# Patient Record
Sex: Female | Born: 1935 | Race: White | Hispanic: No | Marital: Married | Smoking: Never smoker
Health system: Southern US, Community
[De-identification: ages and names within clinical notes are randomized; demographics above are authoritative.]

## PROBLEM LIST (undated history)

## (undated) DIAGNOSIS — C189 Malignant neoplasm of colon, unspecified: Secondary | ICD-10-CM

## (undated) DIAGNOSIS — K529 Noninfective gastroenteritis and colitis, unspecified: Secondary | ICD-10-CM

## (undated) DIAGNOSIS — R55 Syncope and collapse: Secondary | ICD-10-CM

## (undated) DIAGNOSIS — I455 Other specified heart block: Secondary | ICD-10-CM

## (undated) DIAGNOSIS — IMO0002 Reserved for concepts with insufficient information to code with codable children: Secondary | ICD-10-CM

## (undated) DIAGNOSIS — IMO0001 Reserved for inherently not codable concepts without codable children: Secondary | ICD-10-CM

## (undated) DIAGNOSIS — C50919 Malignant neoplasm of unspecified site of unspecified female breast: Secondary | ICD-10-CM

## (undated) DIAGNOSIS — I10 Essential (primary) hypertension: Secondary | ICD-10-CM

## (undated) HISTORY — DX: Syncope and collapse: R55

## (undated) HISTORY — DX: Reserved for concepts with insufficient information to code with codable children: IMO0002

## (undated) HISTORY — PX: BREAST LUMPECTOMY: SHX2

## (undated) HISTORY — DX: Malignant neoplasm of colon, unspecified: C18.9

## (undated) HISTORY — DX: Reserved for inherently not codable concepts without codable children: IMO0001

## (undated) HISTORY — DX: Essential (primary) hypertension: I10

## (undated) HISTORY — PX: TONSILLECTOMY: SUR1361

## (undated) HISTORY — PX: CHOLECYSTECTOMY: SHX55

## (undated) SURGERY — LAPAROSCOPIC PARTIAL COLECTOMY
Anesthesia: General

---

## 1998-01-07 ENCOUNTER — Ambulatory Visit (HOSPITAL_COMMUNITY): Admission: RE | Admit: 1998-01-07 | Discharge: 1998-01-07 | Payer: Self-pay | Admitting: *Deleted

## 1998-01-16 ENCOUNTER — Ambulatory Visit (HOSPITAL_COMMUNITY): Admission: RE | Admit: 1998-01-16 | Discharge: 1998-01-16 | Payer: Self-pay | Admitting: *Deleted

## 1998-01-16 ENCOUNTER — Encounter: Payer: Self-pay | Admitting: *Deleted

## 1998-02-04 ENCOUNTER — Encounter: Payer: Self-pay | Admitting: General Surgery

## 1998-02-04 ENCOUNTER — Ambulatory Visit (HOSPITAL_COMMUNITY): Admission: RE | Admit: 1998-02-04 | Discharge: 1998-02-04 | Payer: Self-pay | Admitting: General Surgery

## 1998-02-24 ENCOUNTER — Encounter: Payer: Self-pay | Admitting: General Surgery

## 1998-02-24 ENCOUNTER — Ambulatory Visit (HOSPITAL_BASED_OUTPATIENT_CLINIC_OR_DEPARTMENT_OTHER): Admission: RE | Admit: 1998-02-24 | Discharge: 1998-02-24 | Payer: Self-pay | Admitting: General Surgery

## 1998-03-04 ENCOUNTER — Ambulatory Visit (HOSPITAL_COMMUNITY): Admission: RE | Admit: 1998-03-04 | Discharge: 1998-03-04 | Payer: Self-pay | Admitting: Interventional Radiology

## 1998-05-13 ENCOUNTER — Other Ambulatory Visit: Admission: RE | Admit: 1998-05-13 | Discharge: 1998-05-13 | Payer: Self-pay | Admitting: *Deleted

## 1998-08-28 ENCOUNTER — Encounter: Admission: RE | Admit: 1998-08-28 | Discharge: 1998-11-26 | Payer: Self-pay | Admitting: Radiation Oncology

## 1998-09-08 ENCOUNTER — Ambulatory Visit (HOSPITAL_COMMUNITY): Admission: RE | Admit: 1998-09-08 | Discharge: 1998-09-08 | Payer: Self-pay | Admitting: Hematology & Oncology

## 1998-09-08 ENCOUNTER — Encounter: Payer: Self-pay | Admitting: Hematology & Oncology

## 1999-02-04 ENCOUNTER — Encounter: Payer: Self-pay | Admitting: Hematology & Oncology

## 1999-02-04 ENCOUNTER — Ambulatory Visit (HOSPITAL_COMMUNITY): Admission: RE | Admit: 1999-02-04 | Discharge: 1999-02-04 | Payer: Self-pay | Admitting: Hematology & Oncology

## 1999-04-14 ENCOUNTER — Other Ambulatory Visit: Admission: RE | Admit: 1999-04-14 | Discharge: 1999-04-14 | Payer: Self-pay | Admitting: Obstetrics and Gynecology

## 1999-04-16 ENCOUNTER — Encounter: Payer: Self-pay | Admitting: Obstetrics and Gynecology

## 1999-04-16 ENCOUNTER — Encounter: Admission: RE | Admit: 1999-04-16 | Discharge: 1999-04-16 | Payer: Self-pay | Admitting: Obstetrics and Gynecology

## 1999-11-30 ENCOUNTER — Ambulatory Visit (HOSPITAL_COMMUNITY): Admission: RE | Admit: 1999-11-30 | Discharge: 1999-11-30 | Payer: Self-pay | Admitting: Gastroenterology

## 2000-02-09 ENCOUNTER — Encounter: Payer: Self-pay | Admitting: Hematology & Oncology

## 2000-02-09 ENCOUNTER — Ambulatory Visit (HOSPITAL_COMMUNITY): Admission: RE | Admit: 2000-02-09 | Discharge: 2000-02-09 | Payer: Self-pay | Admitting: Hematology & Oncology

## 2000-04-20 ENCOUNTER — Encounter: Admission: RE | Admit: 2000-04-20 | Discharge: 2000-04-20 | Payer: Self-pay | Admitting: Obstetrics and Gynecology

## 2000-04-20 ENCOUNTER — Encounter: Payer: Self-pay | Admitting: Obstetrics and Gynecology

## 2000-05-05 ENCOUNTER — Other Ambulatory Visit: Admission: RE | Admit: 2000-05-05 | Discharge: 2000-05-05 | Payer: Self-pay | Admitting: Obstetrics and Gynecology

## 2000-06-04 ENCOUNTER — Ambulatory Visit: Admission: RE | Admit: 2000-06-04 | Discharge: 2000-09-02 | Payer: Self-pay | Admitting: Radiation Oncology

## 2001-02-09 ENCOUNTER — Ambulatory Visit (HOSPITAL_COMMUNITY): Admission: RE | Admit: 2001-02-09 | Discharge: 2001-02-09 | Payer: Self-pay | Admitting: Hematology & Oncology

## 2001-02-09 ENCOUNTER — Encounter: Payer: Self-pay | Admitting: Hematology & Oncology

## 2001-06-30 ENCOUNTER — Other Ambulatory Visit: Admission: RE | Admit: 2001-06-30 | Discharge: 2001-06-30 | Payer: Self-pay | Admitting: Obstetrics and Gynecology

## 2002-02-09 ENCOUNTER — Ambulatory Visit (HOSPITAL_COMMUNITY): Admission: RE | Admit: 2002-02-09 | Discharge: 2002-02-09 | Payer: Self-pay | Admitting: Hematology & Oncology

## 2002-02-09 ENCOUNTER — Encounter: Payer: Self-pay | Admitting: Hematology & Oncology

## 2002-09-10 ENCOUNTER — Other Ambulatory Visit: Admission: RE | Admit: 2002-09-10 | Discharge: 2002-09-10 | Payer: Self-pay | Admitting: Obstetrics and Gynecology

## 2003-02-11 ENCOUNTER — Ambulatory Visit (HOSPITAL_COMMUNITY): Admission: RE | Admit: 2003-02-11 | Discharge: 2003-02-11 | Payer: Self-pay | Admitting: Hematology & Oncology

## 2004-02-11 ENCOUNTER — Ambulatory Visit (HOSPITAL_COMMUNITY): Admission: RE | Admit: 2004-02-11 | Discharge: 2004-02-11 | Payer: Self-pay | Admitting: Hematology & Oncology

## 2004-04-24 ENCOUNTER — Ambulatory Visit: Payer: Self-pay | Admitting: Hematology & Oncology

## 2004-10-23 ENCOUNTER — Ambulatory Visit: Payer: Self-pay | Admitting: Hematology & Oncology

## 2005-02-22 ENCOUNTER — Ambulatory Visit (HOSPITAL_COMMUNITY): Admission: RE | Admit: 2005-02-22 | Discharge: 2005-02-22 | Payer: Self-pay | Admitting: Hematology & Oncology

## 2005-04-23 ENCOUNTER — Ambulatory Visit: Payer: Self-pay | Admitting: Hematology & Oncology

## 2005-10-21 ENCOUNTER — Ambulatory Visit: Payer: Self-pay | Admitting: Hematology & Oncology

## 2005-11-01 LAB — COMPREHENSIVE METABOLIC PANEL
AST: 17 U/L (ref 0–37)
Alkaline Phosphatase: 82 U/L (ref 39–117)
BUN: 14 mg/dL (ref 6–23)
Glucose, Bld: 102 mg/dL — ABNORMAL HIGH (ref 70–99)
Total Bilirubin: 0.5 mg/dL (ref 0.3–1.2)

## 2005-11-01 LAB — CBC WITH DIFFERENTIAL/PLATELET
Basophils Absolute: 0 10*3/uL (ref 0.0–0.1)
EOS%: 1.2 % (ref 0.0–7.0)
Eosinophils Absolute: 0.1 10*3/uL (ref 0.0–0.5)
HGB: 13.7 g/dL (ref 11.6–15.9)
LYMPH%: 31.5 % (ref 14.0–48.0)
MCH: 35.3 pg — ABNORMAL HIGH (ref 26.0–34.0)
MCV: 99.8 fL (ref 81.0–101.0)
MONO%: 9.5 % (ref 0.0–13.0)
NEUT#: 3.7 10*3/uL (ref 1.5–6.5)
Platelets: 199 10*3/uL (ref 145–400)
RBC: 3.88 10*6/uL (ref 3.70–5.32)

## 2006-02-25 ENCOUNTER — Ambulatory Visit (HOSPITAL_COMMUNITY): Admission: RE | Admit: 2006-02-25 | Discharge: 2006-02-25 | Payer: Self-pay | Admitting: Family Medicine

## 2006-03-08 ENCOUNTER — Encounter (INDEPENDENT_AMBULATORY_CARE_PROVIDER_SITE_OTHER): Payer: Self-pay | Admitting: Diagnostic Radiology

## 2006-03-08 ENCOUNTER — Encounter (INDEPENDENT_AMBULATORY_CARE_PROVIDER_SITE_OTHER): Payer: Self-pay | Admitting: Specialist

## 2006-03-08 ENCOUNTER — Encounter: Admission: RE | Admit: 2006-03-08 | Discharge: 2006-03-08 | Payer: Self-pay | Admitting: Family Medicine

## 2006-03-11 ENCOUNTER — Encounter: Admission: RE | Admit: 2006-03-11 | Discharge: 2006-03-11 | Payer: Self-pay | Admitting: Internal Medicine

## 2006-03-18 ENCOUNTER — Encounter: Admission: RE | Admit: 2006-03-18 | Discharge: 2006-03-18 | Payer: Self-pay | Admitting: Internal Medicine

## 2006-03-28 ENCOUNTER — Encounter: Admission: RE | Admit: 2006-03-28 | Discharge: 2006-03-28 | Payer: Self-pay | Admitting: General Surgery

## 2006-03-28 ENCOUNTER — Encounter (INDEPENDENT_AMBULATORY_CARE_PROVIDER_SITE_OTHER): Payer: Self-pay | Admitting: *Deleted

## 2006-04-04 ENCOUNTER — Encounter: Admission: RE | Admit: 2006-04-04 | Discharge: 2006-04-04 | Payer: Self-pay | Admitting: General Surgery

## 2006-04-05 ENCOUNTER — Encounter: Admission: RE | Admit: 2006-04-05 | Discharge: 2006-04-05 | Payer: Self-pay | Admitting: General Surgery

## 2006-04-05 ENCOUNTER — Encounter (INDEPENDENT_AMBULATORY_CARE_PROVIDER_SITE_OTHER): Payer: Self-pay | Admitting: Specialist

## 2006-04-05 ENCOUNTER — Ambulatory Visit (HOSPITAL_BASED_OUTPATIENT_CLINIC_OR_DEPARTMENT_OTHER): Admission: RE | Admit: 2006-04-05 | Discharge: 2006-04-05 | Payer: Self-pay | Admitting: General Surgery

## 2006-04-22 ENCOUNTER — Ambulatory Visit: Admission: RE | Admit: 2006-04-22 | Discharge: 2006-06-24 | Payer: Self-pay | Admitting: Radiation Oncology

## 2006-05-09 ENCOUNTER — Ambulatory Visit: Payer: Self-pay | Admitting: Hematology & Oncology

## 2006-05-11 LAB — COMPREHENSIVE METABOLIC PANEL
ALT: 19 U/L (ref 0–35)
AST: 20 U/L (ref 0–37)
CO2: 27 mEq/L (ref 19–32)
Calcium: 9.8 mg/dL (ref 8.4–10.5)
Chloride: 103 mEq/L (ref 96–112)
Sodium: 139 mEq/L (ref 135–145)
Total Protein: 7.3 g/dL (ref 6.0–8.3)

## 2006-05-11 LAB — CBC WITH DIFFERENTIAL/PLATELET
BASO%: 0.7 % (ref 0.0–2.0)
EOS%: 1.5 % (ref 0.0–7.0)
HCT: 38.5 % (ref 34.8–46.6)
HGB: 13.9 g/dL (ref 11.6–15.9)
MCH: 35.1 pg — ABNORMAL HIGH (ref 26.0–34.0)
MCHC: 36.2 g/dL — ABNORMAL HIGH (ref 32.0–36.0)
MONO#: 0.7 10*3/uL (ref 0.1–0.9)
NEUT#: 2.9 10*3/uL (ref 1.5–6.5)
Platelets: 204 10*3/uL (ref 145–400)
RBC: 3.97 10*6/uL (ref 3.70–5.32)
RDW: 12.7 % (ref 11.3–14.5)
lymph#: 1.5 10*3/uL (ref 0.9–3.3)

## 2006-07-12 ENCOUNTER — Ambulatory Visit: Payer: Self-pay | Admitting: Hematology & Oncology

## 2006-10-19 ENCOUNTER — Ambulatory Visit: Payer: Self-pay | Admitting: Hematology & Oncology

## 2006-10-21 LAB — COMPREHENSIVE METABOLIC PANEL
ALT: 20 U/L (ref 0–35)
AST: 20 U/L (ref 0–37)
Alkaline Phosphatase: 83 U/L (ref 39–117)
CO2: 26 mEq/L (ref 19–32)
Creatinine, Ser: 0.83 mg/dL (ref 0.40–1.20)
Sodium: 140 mEq/L (ref 135–145)
Total Bilirubin: 0.5 mg/dL (ref 0.3–1.2)
Total Protein: 7.3 g/dL (ref 6.0–8.3)

## 2006-10-21 LAB — CBC WITH DIFFERENTIAL/PLATELET
BASO%: 0.7 % (ref 0.0–2.0)
EOS%: 1.6 % (ref 0.0–7.0)
HCT: 36.8 % (ref 34.8–46.6)
LYMPH%: 32.3 % (ref 14.0–48.0)
MCH: 36 pg — ABNORMAL HIGH (ref 26.0–34.0)
MCHC: 36.6 g/dL — ABNORMAL HIGH (ref 32.0–36.0)
MONO#: 0.5 10*3/uL (ref 0.1–0.9)
NEUT%: 54.4 % (ref 39.6–76.8)
Platelets: 180 10*3/uL (ref 145–400)
RBC: 3.74 10*6/uL (ref 3.70–5.32)
WBC: 4.6 10*3/uL (ref 3.9–10.0)

## 2007-02-15 ENCOUNTER — Ambulatory Visit: Payer: Self-pay | Admitting: Hematology & Oncology

## 2007-02-17 LAB — CBC WITH DIFFERENTIAL/PLATELET
BASO%: 0.8 % (ref 0.0–2.0)
Eosinophils Absolute: 0.1 10*3/uL (ref 0.0–0.5)
LYMPH%: 33.2 % (ref 14.0–48.0)
MCHC: 34.8 g/dL (ref 32.0–36.0)
MCV: 99.1 fL (ref 81.0–101.0)
MONO%: 11 % (ref 0.0–13.0)
NEUT#: 2.5 10*3/uL (ref 1.5–6.5)
Platelets: 186 10*3/uL (ref 145–400)
RBC: 3.92 10*6/uL (ref 3.70–5.32)
RDW: 13.1 % (ref 11.3–14.5)
WBC: 4.7 10*3/uL (ref 3.9–10.0)

## 2007-02-17 LAB — COMPREHENSIVE METABOLIC PANEL
ALT: 18 U/L (ref 0–35)
AST: 22 U/L (ref 0–37)
Albumin: 4.4 g/dL (ref 3.5–5.2)
Alkaline Phosphatase: 65 U/L (ref 39–117)
Glucose, Bld: 79 mg/dL (ref 70–99)
Potassium: 4.6 mEq/L (ref 3.5–5.3)
Sodium: 140 mEq/L (ref 135–145)
Total Bilirubin: 0.4 mg/dL (ref 0.3–1.2)
Total Protein: 7.1 g/dL (ref 6.0–8.3)

## 2007-03-10 ENCOUNTER — Encounter: Admission: RE | Admit: 2007-03-10 | Discharge: 2007-03-10 | Payer: Self-pay | Admitting: Hematology & Oncology

## 2007-06-02 ENCOUNTER — Ambulatory Visit: Payer: Self-pay | Admitting: Hematology & Oncology

## 2007-06-06 LAB — COMPREHENSIVE METABOLIC PANEL
Albumin: 4.6 g/dL (ref 3.5–5.2)
BUN: 10 mg/dL (ref 6–23)
CO2: 24 mEq/L (ref 19–32)
Calcium: 9.6 mg/dL (ref 8.4–10.5)
Chloride: 106 mEq/L (ref 96–112)
Creatinine, Ser: 0.7 mg/dL (ref 0.40–1.20)
Glucose, Bld: 88 mg/dL (ref 70–99)
Potassium: 4.3 mEq/L (ref 3.5–5.3)

## 2007-06-06 LAB — CBC WITH DIFFERENTIAL/PLATELET
Basophils Absolute: 0 10*3/uL (ref 0.0–0.1)
Eosinophils Absolute: 0.1 10*3/uL (ref 0.0–0.5)
MCH: 34.9 pg — ABNORMAL HIGH (ref 26.0–34.0)
NEUT%: 50.8 % (ref 39.6–76.8)
RDW: 13.2 % (ref 11.3–14.5)
WBC: 4.7 10*3/uL (ref 3.9–10.0)
lymph#: 1.7 10*3/uL (ref 0.9–3.3)

## 2007-12-06 ENCOUNTER — Ambulatory Visit: Payer: Self-pay | Admitting: Hematology & Oncology

## 2007-12-07 LAB — CBC WITH DIFFERENTIAL (CANCER CENTER ONLY)
BASO%: 0.6 % (ref 0.0–2.0)
Eosinophils Absolute: 0.1 10*3/uL (ref 0.0–0.5)
HCT: 37.2 % (ref 34.8–46.6)
LYMPH#: 1.7 10*3/uL (ref 0.9–3.3)
MONO#: 0.4 10*3/uL (ref 0.1–0.9)
Platelets: 179 10*3/uL (ref 145–400)
RBC: 3.69 10*6/uL — ABNORMAL LOW (ref 3.70–5.32)
RDW: 10.4 % — ABNORMAL LOW (ref 10.5–14.6)
WBC: 5.1 10*3/uL (ref 3.9–10.0)

## 2007-12-07 LAB — COMPREHENSIVE METABOLIC PANEL
ALT: 20 U/L (ref 0–35)
CO2: 25 mEq/L (ref 19–32)
Sodium: 141 mEq/L (ref 135–145)
Total Bilirubin: 0.5 mg/dL (ref 0.3–1.2)
Total Protein: 7 g/dL (ref 6.0–8.3)

## 2008-03-22 ENCOUNTER — Encounter: Admission: RE | Admit: 2008-03-22 | Discharge: 2008-03-22 | Payer: Self-pay | Admitting: Hematology & Oncology

## 2008-06-05 ENCOUNTER — Ambulatory Visit: Payer: Self-pay | Admitting: Hematology & Oncology

## 2008-06-06 LAB — COMPREHENSIVE METABOLIC PANEL
ALT: 18 U/L (ref 0–35)
BUN: 13 mg/dL (ref 6–23)
CO2: 26 mEq/L (ref 19–32)
Calcium: 9.9 mg/dL (ref 8.4–10.5)
Chloride: 103 mEq/L (ref 96–112)
Creatinine, Ser: 0.79 mg/dL (ref 0.40–1.20)

## 2008-06-06 LAB — CBC WITH DIFFERENTIAL (CANCER CENTER ONLY)
BASO%: 0.4 % (ref 0.0–2.0)
Eosinophils Absolute: 0.1 10*3/uL (ref 0.0–0.5)
LYMPH%: 39.2 % (ref 14.0–48.0)
MCV: 102 fL — ABNORMAL HIGH (ref 81–101)
MONO#: 0.3 10*3/uL (ref 0.1–0.9)
MONO%: 7.4 % (ref 0.0–13.0)
NEUT#: 2.1 10*3/uL (ref 1.5–6.5)
Platelets: 162 10*3/uL (ref 145–400)
RBC: 3.62 10*6/uL — ABNORMAL LOW (ref 3.70–5.32)
RDW: 11.2 % (ref 10.5–14.6)
WBC: 4.2 10*3/uL (ref 3.9–10.0)

## 2008-06-06 LAB — CHCC SATELLITE - SMEAR

## 2008-12-11 ENCOUNTER — Ambulatory Visit: Payer: Self-pay | Admitting: Hematology & Oncology

## 2008-12-12 LAB — CBC WITH DIFFERENTIAL (CANCER CENTER ONLY)
EOS%: 2 % (ref 0.0–7.0)
Eosinophils Absolute: 0.1 10*3/uL (ref 0.0–0.5)
LYMPH%: 42.8 % (ref 14.0–48.0)
MCH: 35.6 pg — ABNORMAL HIGH (ref 26.0–34.0)
MCHC: 35.7 g/dL (ref 32.0–36.0)
MCV: 100 fL (ref 81–101)
MONO%: 10 % (ref 0.0–13.0)
NEUT#: 2.4 10*3/uL (ref 1.5–6.5)
Platelets: 200 10*3/uL (ref 145–400)
RDW: 9.8 % — ABNORMAL LOW (ref 10.5–14.6)

## 2008-12-13 LAB — COMPREHENSIVE METABOLIC PANEL
Albumin: 4.6 g/dL (ref 3.5–5.2)
Alkaline Phosphatase: 56 U/L (ref 39–117)
BUN: 14 mg/dL (ref 6–23)
CO2: 28 mEq/L (ref 19–32)
Calcium: 10.1 mg/dL (ref 8.4–10.5)
Glucose, Bld: 73 mg/dL (ref 70–99)
Potassium: 4.3 mEq/L (ref 3.5–5.3)
Total Protein: 7 g/dL (ref 6.0–8.3)

## 2008-12-13 LAB — VITAMIN D 25 HYDROXY (VIT D DEFICIENCY, FRACTURES): Vit D, 25-Hydroxy: 42 ng/mL (ref 30–89)

## 2009-04-03 ENCOUNTER — Encounter: Admission: RE | Admit: 2009-04-03 | Discharge: 2009-04-03 | Payer: Self-pay | Admitting: Hematology & Oncology

## 2009-06-04 ENCOUNTER — Ambulatory Visit: Payer: Self-pay | Admitting: Hematology & Oncology

## 2009-06-05 LAB — CBC WITH DIFFERENTIAL (CANCER CENTER ONLY)
Eosinophils Absolute: 0.1 10*3/uL (ref 0.0–0.5)
MCHC: 34.8 g/dL (ref 32.0–36.0)
MCV: 102 fL — ABNORMAL HIGH (ref 81–101)
NEUT#: 3.1 10*3/uL (ref 1.5–6.5)
RBC: 3.87 10*6/uL (ref 3.70–5.32)
RDW: 10.7 % (ref 10.5–14.6)
WBC: 5.8 10*3/uL (ref 3.9–10.0)

## 2009-06-05 LAB — COMPREHENSIVE METABOLIC PANEL
ALT: 21 U/L (ref 0–35)
AST: 20 U/L (ref 0–37)
Albumin: 4.7 g/dL (ref 3.5–5.2)
Alkaline Phosphatase: 89 U/L (ref 39–117)
BUN: 13 mg/dL (ref 6–23)
CO2: 24 mEq/L (ref 19–32)
Creatinine, Ser: 0.76 mg/dL (ref 0.40–1.20)
Potassium: 4 mEq/L (ref 3.5–5.3)
Sodium: 136 mEq/L (ref 135–145)
Total Bilirubin: 0.5 mg/dL (ref 0.3–1.2)

## 2009-12-02 ENCOUNTER — Ambulatory Visit: Payer: Self-pay | Admitting: Hematology & Oncology

## 2009-12-04 LAB — CBC WITH DIFFERENTIAL (CANCER CENTER ONLY)
EOS%: 2 % (ref 0.0–7.0)
Eosinophils Absolute: 0.1 10*3/uL (ref 0.0–0.5)
HCT: 41.4 % (ref 34.8–46.6)
LYMPH#: 1.7 10*3/uL (ref 0.9–3.3)
LYMPH%: 36.9 % (ref 14.0–48.0)
MCHC: 34.3 g/dL (ref 32.0–36.0)
MCV: 102 fL — ABNORMAL HIGH (ref 81–101)
NEUT#: 2.3 10*3/uL (ref 1.5–6.5)
Platelets: 190 10*3/uL (ref 145–400)
RBC: 4.04 10*6/uL (ref 3.70–5.32)
RDW: 10.8 % (ref 10.5–14.6)
WBC: 4.6 10*3/uL (ref 3.9–10.0)

## 2009-12-05 LAB — COMPREHENSIVE METABOLIC PANEL
ALT: 22 U/L (ref 0–35)
AST: 25 U/L (ref 0–37)
BUN: 15 mg/dL (ref 6–23)
Calcium: 10.5 mg/dL (ref 8.4–10.5)
Sodium: 139 mEq/L (ref 135–145)
Total Bilirubin: 0.6 mg/dL (ref 0.3–1.2)
Total Protein: 7.7 g/dL (ref 6.0–8.3)

## 2009-12-05 LAB — VITAMIN D 25 HYDROXY (VIT D DEFICIENCY, FRACTURES): Vit D, 25-Hydroxy: 60 ng/mL (ref 30–89)

## 2010-02-15 ENCOUNTER — Encounter: Payer: Self-pay | Admitting: General Surgery

## 2010-04-01 ENCOUNTER — Other Ambulatory Visit: Payer: Self-pay | Admitting: Hematology & Oncology

## 2010-04-01 DIAGNOSIS — Z9889 Other specified postprocedural states: Secondary | ICD-10-CM

## 2010-04-01 DIAGNOSIS — Z1231 Encounter for screening mammogram for malignant neoplasm of breast: Secondary | ICD-10-CM

## 2010-04-07 ENCOUNTER — Ambulatory Visit
Admission: RE | Admit: 2010-04-07 | Discharge: 2010-04-07 | Disposition: A | Discharge status: Home / Self Care | Payer: Medicare Other | Source: Ambulatory Visit | Attending: Hematology & Oncology | Admitting: Hematology & Oncology

## 2010-04-07 DIAGNOSIS — Z9889 Other specified postprocedural states: Secondary | ICD-10-CM

## 2010-06-11 ENCOUNTER — Other Ambulatory Visit: Payer: Self-pay | Admitting: Hematology & Oncology

## 2010-06-11 ENCOUNTER — Encounter (HOSPITAL_BASED_OUTPATIENT_CLINIC_OR_DEPARTMENT_OTHER): Payer: Medicare Other | Admitting: Hematology & Oncology

## 2010-06-11 DIAGNOSIS — E559 Vitamin D deficiency, unspecified: Secondary | ICD-10-CM

## 2010-06-11 DIAGNOSIS — Z170 Estrogen receptor positive status [ER+]: Secondary | ICD-10-CM

## 2010-06-11 DIAGNOSIS — D0590 Unspecified type of carcinoma in situ of unspecified breast: Secondary | ICD-10-CM

## 2010-06-11 DIAGNOSIS — Z853 Personal history of malignant neoplasm of breast: Secondary | ICD-10-CM

## 2010-06-11 LAB — CBC WITH DIFFERENTIAL (CANCER CENTER ONLY)
BASO#: 0 10*3/uL (ref 0.0–0.2)
HCT: 37.5 % (ref 34.8–46.6)
HGB: 13.1 g/dL (ref 11.6–15.9)
LYMPH%: 36.7 % (ref 14.0–48.0)
MCV: 99 fL (ref 81–101)
MONO%: 13.7 % — ABNORMAL HIGH (ref 0.0–13.0)

## 2010-06-12 LAB — COMPREHENSIVE METABOLIC PANEL
AST: 21 U/L (ref 0–37)
Albumin: 4.5 g/dL (ref 3.5–5.2)
Alkaline Phosphatase: 101 U/L (ref 39–117)
BUN: 16 mg/dL (ref 6–23)
Potassium: 4.3 mEq/L (ref 3.5–5.3)
Sodium: 140 mEq/L (ref 135–145)
Total Bilirubin: 0.5 mg/dL (ref 0.3–1.2)

## 2010-06-12 NOTE — Op Note (Signed)
NAMEARIJANA, NARAYAN              ACCOUNT NO.:  [id number]   MEDICAL RECORD NO.:  [id number]          PATIENT TYPE:  AMB   LOCATION:  DSC                          FACILITY:  [facility]   PHYSICIAN:  Rose Phi. Maple Hudson, M.D.   DATE OF BIRTH:  29-Apr-1929   DATE OF PROCEDURE:  04/05/2006  DATE OF DISCHARGE:                               OPERATIVE REPORT   PREOPERATIVE DIAGNOSIS:  Ductal carcinoma in situ of the right breast.   POSTOPERATIVE DIAGNOSIS:  Ductal carcinoma in situ of the right breast.   OPERATION PERFORMED:  Right partial mastectomy with needle localization  and specimen mammogram.   SURGEON:  Rose Phi. Maple Hudson, M.D.   ANESTHESIA:  General.   OPERATIVE PROCEDURE:  Prior to coming to the operating room, two wires  were placed in the upper inner quadrant of the right breast at the two  separate foci of DCIS.   DESCRIPTION OF PROCEDURE:  After suitable general anesthesia was  induced, the patient was placed on the operating room table in the  supine position with the arms extended on the arm board.  The right  breast was then prepped and draped in the usual fashion.  The two wires  were coming from medial to lateral into the upper outer quadrant.  I  outlined where I thought the area of abnormality was and a curved  incision was outlined with a marking pencil.  We then made the incision  and exposed the wires and excised this area.  I oriented it for the  pathologist.  Specimen mammogram confirmed the removal of the lesions.  Hemostasis was obtained with cautery.  Subcuticular closure with 4-0  Monocryl and Dermabond was carried out.  A light dressing was applied.  She was then wrapped with a little compression ACE bandage to leave  until the next day.  She was then transferred to the recovery room in  satisfactory condition having tolerated the procedure well.      Rose Phi. Maple Hudson, M.D.  Electronically Signed     PRY/MEDQ  D:  04/05/2006  T:  04/05/2006  Job:  [id number]

## 2010-06-12 NOTE — Procedures (Signed)
[facility]  Patient:    Tamara [name], Tamara [name]                    MRN: [id number] Proc. Date: 11/30/99 Attending:  Verlin Grills, M.D. CC:         Tamara [name], M.D.  Tamara [name], M.D.   Procedure Report  PROCEDURE:  Colonoscopy  REFERRING PHYSICIAN:  Dr. Robley Fries and Dr. Floyde Parkins.  INDICATION FOR PROCEDURE:  Ms. Tamara [name]. Tamara [name] is a 77 year old female who is undergoing surveillance colonoscopy to prevent colorectal cancer. Ms. Tamara [name] denies a personal or family history of colorectal neoplasia. She has undergone surgery and chemotherapy for breast cancer.  I discussed with Ms. Tamara [name], the complications associated with colonoscopy and polypectomy including a 1:1000 risk of bleeding and 4:1000 risk of intestinal perforation which would require surgical repair. Tamara [name] has signed the operative permit.  ENDOSCOPIST:  Verlin Grills, M.D.  PREMEDICATION:  Versed 5 mg, Demerol 50 mg.  ENDOSCOPE:  Olympus pediatric colonoscope.  DESCRIPTION OF PROCEDURE:  After obtaining informed consent, the patient was placed in the left lateral decubitus position. I administered intravenous Demerol and intravenous Versed to achieve conscious sedation for the procedure. The patients cardiac rhythm, oxygen saturation and blood pressure were monitored throughout the procedure and documented in the medical record.  Anal inspection was normal. Digital rectal examination was normal. The Olympus pediatric video colonoscope was introduced into the rectum and under direct vision advanced to the cecum as identified by a normal appearing ileocecal valve. Colonic preparation for the exam today was excellent.  RECTUM:  Normal.  SIGMOID COLON/DESCENDING COLON:  Normal.  SPLENIC FLEXURE:  Normal.  TRANSVERSE COLON:  Normal.  HEPATIC FLEXURE:  Normal.  ASCENDING COLON:  Normal.  CECUM/ILEOCECAL VALVE:   Normal.  ASSESSMENT:  Normal screening proctocolonoscopy to the cecum.  RECOMMENDATIONS:  Repeat colonoscopy in 5-10 years. DD:  11/30/99 TD:  11/30/99 Job: [id number]

## 2010-07-02 ENCOUNTER — Other Ambulatory Visit: Payer: Self-pay | Admitting: Internal Medicine

## 2010-07-02 ENCOUNTER — Ambulatory Visit
Admission: RE | Admit: 2010-07-02 | Discharge: 2010-07-02 | Disposition: A | Discharge status: Home / Self Care | Payer: Medicare Other | Source: Ambulatory Visit | Attending: Internal Medicine | Admitting: Internal Medicine

## 2010-07-02 DIAGNOSIS — R52 Pain, unspecified: Secondary | ICD-10-CM

## 2010-07-02 MED ORDER — IOHEXOL 300 MG/ML  SOLN
100.0000 mL | Freq: Once | INTRAMUSCULAR | Status: AC | PRN
  Administered 2010-07-02: 100 mL via INTRAVENOUS

## 2010-12-10 ENCOUNTER — Ambulatory Visit (HOSPITAL_BASED_OUTPATIENT_CLINIC_OR_DEPARTMENT_OTHER): Payer: Medicare Other | Admitting: Hematology & Oncology

## 2010-12-10 ENCOUNTER — Other Ambulatory Visit (HOSPITAL_BASED_OUTPATIENT_CLINIC_OR_DEPARTMENT_OTHER): Payer: Medicare Other | Admitting: Lab

## 2010-12-10 ENCOUNTER — Other Ambulatory Visit: Payer: Self-pay | Admitting: Hematology & Oncology

## 2010-12-10 VITALS — BP 153/65 | HR 63 | Temp 97.0°F | Ht 65.25 in | Wt 127.0 lb

## 2010-12-10 DIAGNOSIS — M810 Age-related osteoporosis without current pathological fracture: Secondary | ICD-10-CM

## 2010-12-10 DIAGNOSIS — D0590 Unspecified type of carcinoma in situ of unspecified breast: Secondary | ICD-10-CM

## 2010-12-10 DIAGNOSIS — Z171 Estrogen receptor negative status [ER-]: Secondary | ICD-10-CM

## 2010-12-10 DIAGNOSIS — Z853 Personal history of malignant neoplasm of breast: Secondary | ICD-10-CM

## 2010-12-10 DIAGNOSIS — C50919 Malignant neoplasm of unspecified site of unspecified female breast: Secondary | ICD-10-CM

## 2010-12-10 LAB — COMPREHENSIVE METABOLIC PANEL
Albumin: 4.4 g/dL (ref 3.5–5.2)
Alkaline Phosphatase: 111 U/L (ref 39–117)
BUN: 14 mg/dL (ref 6–23)
CO2: 22 mEq/L (ref 19–32)
Glucose, Bld: 71 mg/dL (ref 70–99)
Total Bilirubin: 0.6 mg/dL (ref 0.3–1.2)

## 2010-12-10 LAB — CBC WITH DIFFERENTIAL (CANCER CENTER ONLY)
BASO#: 0 10*3/uL (ref 0.0–0.2)
EOS%: 1.3 % (ref 0.0–7.0)
Eosinophils Absolute: 0.1 10*3/uL (ref 0.0–0.5)
HCT: 35.4 % (ref 34.8–46.6)
HGB: 12.8 g/dL (ref 11.6–15.9)
MCH: 35.4 pg — ABNORMAL HIGH (ref 26.0–34.0)
MCHC: 36.2 g/dL — ABNORMAL HIGH (ref 32.0–36.0)
MCV: 98 fL (ref 81–101)
MONO%: 13.5 % — ABNORMAL HIGH (ref 0.0–13.0)
NEUT%: 49.7 % (ref 39.6–80.0)
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
## 2010-12-10 NOTE — Progress Notes (Signed)
CSN: [id number] This office note has been dictated.

## 2010-12-11 ENCOUNTER — Encounter: Payer: Self-pay | Admitting: *Deleted

## 2010-12-11 LAB — COMPREHENSIVE METABOLIC PANEL
AST: 24 U/L (ref 0–37)
Albumin: 4.4 g/dL (ref 3.5–5.2)
Albumin: 4.4 g/dL (ref 3.5–5.2)
Alkaline Phosphatase: 111 U/L (ref 39–117)
BUN: 14 mg/dL (ref 6–23)
CO2: 22 mEq/L (ref 19–32)
Calcium: 10.2 mg/dL (ref 8.4–10.5)
Chloride: 103 mEq/L (ref 96–112)
Chloride: 103 mEq/L (ref 96–112)
Glucose, Bld: 71 mg/dL (ref 70–99)
Glucose, Bld: 71 mg/dL (ref 70–99)
Potassium: 4.3 mEq/L (ref 3.5–5.3)
Potassium: 4.3 mEq/L (ref 3.5–5.3)
Sodium: 139 mEq/L (ref 135–145)
Sodium: 139 mEq/L (ref 135–145)
Total Protein: 6.7 g/dL (ref 6.0–8.3)
Total Protein: 6.7 g/dL (ref 6.0–8.3)

## 2010-12-11 LAB — VITAMIN D 25 HYDROXY (VIT D DEFICIENCY, FRACTURES): Vit D, 25-Hydroxy: 63 ng/mL (ref 30–89)

## 2010-12-11 NOTE — Progress Notes (Signed)
Mailed labs from 12/10/10 to pt's home per Dr Gustavo Lah instructions.

## 2011-04-08 DIAGNOSIS — J342 Deviated nasal septum: Secondary | ICD-10-CM | POA: Diagnosis not present

## 2011-04-08 DIAGNOSIS — H6980 Other specified disorders of Eustachian tube, unspecified ear: Secondary | ICD-10-CM | POA: Diagnosis not present

## 2011-04-08 DIAGNOSIS — J310 Chronic rhinitis: Secondary | ICD-10-CM | POA: Diagnosis not present

## 2011-04-12 ENCOUNTER — Other Ambulatory Visit: Payer: Self-pay | Admitting: Hematology & Oncology

## 2011-04-12 DIAGNOSIS — Z853 Personal history of malignant neoplasm of breast: Secondary | ICD-10-CM

## 2011-04-12 DIAGNOSIS — Z9889 Other specified postprocedural states: Secondary | ICD-10-CM

## 2011-04-15 ENCOUNTER — Ambulatory Visit
Admission: RE | Admit: 2011-04-15 | Discharge: 2011-04-15 | Disposition: A | Discharge status: Home / Self Care | Payer: Medicare Other | Source: Ambulatory Visit | Attending: Hematology & Oncology | Admitting: Hematology & Oncology

## 2011-04-15 DIAGNOSIS — Z9889 Other specified postprocedural states: Secondary | ICD-10-CM

## 2011-04-15 DIAGNOSIS — Z853 Personal history of malignant neoplasm of breast: Secondary | ICD-10-CM | POA: Diagnosis not present

## 2011-05-18 DIAGNOSIS — L259 Unspecified contact dermatitis, unspecified cause: Secondary | ICD-10-CM | POA: Diagnosis not present

## 2011-05-31 DIAGNOSIS — J029 Acute pharyngitis, unspecified: Secondary | ICD-10-CM | POA: Diagnosis not present

## 2011-05-31 DIAGNOSIS — R350 Frequency of micturition: Secondary | ICD-10-CM | POA: Diagnosis not present

## 2011-06-08 DIAGNOSIS — J069 Acute upper respiratory infection, unspecified: Secondary | ICD-10-CM | POA: Diagnosis not present

## 2011-06-10 ENCOUNTER — Other Ambulatory Visit (HOSPITAL_BASED_OUTPATIENT_CLINIC_OR_DEPARTMENT_OTHER): Payer: Medicare Other | Admitting: Lab

## 2011-06-10 ENCOUNTER — Ambulatory Visit (HOSPITAL_BASED_OUTPATIENT_CLINIC_OR_DEPARTMENT_OTHER): Payer: Medicare Other | Admitting: Hematology & Oncology

## 2011-06-10 VITALS — BP 150/71 | HR 86 | Temp 97.0°F | Ht 66.0 in | Wt 130.0 lb

## 2011-06-10 DIAGNOSIS — E559 Vitamin D deficiency, unspecified: Secondary | ICD-10-CM

## 2011-06-10 DIAGNOSIS — C50919 Malignant neoplasm of unspecified site of unspecified female breast: Secondary | ICD-10-CM

## 2011-06-10 DIAGNOSIS — D0511 Intraductal carcinoma in situ of right breast: Secondary | ICD-10-CM

## 2011-06-10 DIAGNOSIS — Z853 Personal history of malignant neoplasm of breast: Secondary | ICD-10-CM

## 2011-06-10 DIAGNOSIS — D0590 Unspecified type of carcinoma in situ of unspecified breast: Secondary | ICD-10-CM

## 2011-06-10 DIAGNOSIS — Z171 Estrogen receptor negative status [ER-]: Secondary | ICD-10-CM

## 2011-06-10 DIAGNOSIS — M810 Age-related osteoporosis without current pathological fracture: Secondary | ICD-10-CM

## 2011-06-10 LAB — CBC WITH DIFFERENTIAL (CANCER CENTER ONLY)
BASO%: 0.6 % (ref 0.0–2.0)
EOS%: 1.5 % (ref 0.0–7.0)
Eosinophils Absolute: 0.1 10*3/uL (ref 0.0–0.5)
MCH: 35.4 pg — ABNORMAL HIGH (ref 26.0–34.0)
MONO%: 13.3 % — ABNORMAL HIGH (ref 0.0–13.0)
NEUT#: 2.5 10*3/uL (ref 1.5–6.5)
Platelets: 179 10*3/uL (ref 145–400)
RBC: 3.59 10*6/uL — ABNORMAL LOW (ref 3.70–5.32)
RDW: 12 % (ref 11.1–15.7)

## 2011-06-10 NOTE — Progress Notes (Signed)
This office note has been dictated.

## 2011-06-11 LAB — COMPREHENSIVE METABOLIC PANEL
ALT: 20 U/L (ref 0–35)
AST: 23 U/L (ref 0–37)
Albumin: 4.1 g/dL (ref 3.5–5.2)
Alkaline Phosphatase: 83 U/L (ref 39–117)
Glucose, Bld: 99 mg/dL (ref 70–99)
Potassium: 4.5 mEq/L (ref 3.5–5.3)
Sodium: 132 mEq/L — ABNORMAL LOW (ref 135–145)
Total Protein: 6.4 g/dL (ref 6.0–8.3)

## 2011-06-11 NOTE — Progress Notes (Signed)
CC:   Tamara [name], M.D. Tamara [name], M.D.  DIAGNOSES: 1. DCIS of the right breast. 2. History of stage IIA (T2 N0 M0) ductal carcinoma of the left     breast.  CURRENT THERAPY:  Observation  INTERIM HISTORY:  Tamara [name] comes in for followup.  We see her every 6 months.  She is actually doing quite well.  She has had no real health problems since we last saw her.  She has had no problems with fevers, sweats or chills.  She has had no problem with cough or shortness breath.  There has been no bleeding.  She has had no change in bowel or bladder habits.  Her last mammogram was done 2 months ago.  Everything looked good with the mammogram.  She currently is in the [place] area.  [place]  They have a house out there.  PHYSICAL EXAMINATION:  This is a well-developed well-nourished white female in no obvious distress.  Vital signs:  Temperature of 97, pulse 86, respiratory rate 18, blood pressure 150/71.  Weight is 130.  Head and neck:  Normocephalic, atraumatic skull.  There are no ocular or oral lesions.  There are no palpable cervical or supraclavicular lymph nodes. Lungs:  Clear to percussion and auscultation bilaterally.  Cardiac: Regular rate and rhythm with a normal S1 and S2.  There are no murmurs or rubs, or bruits.  Abdomen:  Soft with good bowel sounds.  There is no palpable abdominal mass.  There is no fluid wave.  There is no palpable hepatosplenomegaly.  Breasts: Right breast with lumpectomy at about the 3 o'clock position.  There is some contraction at the lumpectomy site. No distinct masses noted in the right breast.  There is no right axillary adenopathy.  Left breast shows a lumpectomy at the 2 o'clock position.  No obvious mass is noted in the left breast.  There is no left axillary adenopathy.  Back: No tenderness over the spine, ribs, or hips.  There is no kyphosis or osteoporotic changes.   Extremities: No clubbing, cyanosis or edema.  Neurologic:  No focal neurological deficits.  LABORATORY STUDIES:  White cell count 5.2, hemoglobin 12.7, hematocrit 36, platelet count 179.  IMPRESSION:  Tamara [name] is a 78 year old white female with a history of ductal carcinoma of the left breast.  This was stage IIA.  This was ER negative and PR negative.  She completed 8 cycles of CMF back in 2000.  She then had a DCIS of the right breast.  She underwent lumpectomy and radiation.  She completed this back in May 2008.  We did try her on some antiestrogen therapy after the DCIS was discovered, but she did not tolerate this well.  For now, I see no problems with recurrence.  Will plan to get her back in 6 months for followup.    ______________________________ Josph Macho, M.D. PRE/MEDQ  D:  06/10/2011  T:  06/11/2011  Job:  [id number]

## 2011-06-17 ENCOUNTER — Telehealth: Payer: Self-pay | Admitting: *Deleted

## 2011-06-17 NOTE — Telephone Encounter (Signed)
Called patient to let her know that her labwork was good.  Unable to reach patient though and did not have an answering machine.  Labs mailed to patient with a note statin that they were good.

## 2011-06-17 NOTE — Telephone Encounter (Signed)
Message copied by Anselm Jungling on Thu Jun 17, 2011 10:41 AM ------      Message from: Arlan Organ R      Created: Mon Jun 14, 2011  5:56 PM       Call - labs are good!!  Please mail them to her!!!!  Cindee Lame

## 2011-12-09 ENCOUNTER — Ambulatory Visit (HOSPITAL_BASED_OUTPATIENT_CLINIC_OR_DEPARTMENT_OTHER): Payer: Medicare Other | Admitting: Hematology & Oncology

## 2011-12-09 ENCOUNTER — Other Ambulatory Visit (HOSPITAL_BASED_OUTPATIENT_CLINIC_OR_DEPARTMENT_OTHER): Payer: Medicare Other | Admitting: Lab

## 2011-12-09 VITALS — BP 164/75 | HR 68 | Temp 97.7°F | Resp 16 | Ht 66.0 in | Wt 128.0 lb

## 2011-12-09 DIAGNOSIS — E559 Vitamin D deficiency, unspecified: Secondary | ICD-10-CM | POA: Diagnosis not present

## 2011-12-09 DIAGNOSIS — D0590 Unspecified type of carcinoma in situ of unspecified breast: Secondary | ICD-10-CM

## 2011-12-09 DIAGNOSIS — M810 Age-related osteoporosis without current pathological fracture: Secondary | ICD-10-CM | POA: Diagnosis not present

## 2011-12-09 DIAGNOSIS — C50919 Malignant neoplasm of unspecified site of unspecified female breast: Secondary | ICD-10-CM

## 2011-12-09 DIAGNOSIS — Z853 Personal history of malignant neoplasm of breast: Secondary | ICD-10-CM | POA: Diagnosis not present

## 2011-12-09 DIAGNOSIS — D0511 Intraductal carcinoma in situ of right breast: Secondary | ICD-10-CM

## 2011-12-09 LAB — CBC WITH DIFFERENTIAL (CANCER CENTER ONLY)
Eosinophils Absolute: 0.1 10*3/uL (ref 0.0–0.5)
HCT: 39.5 % (ref 34.8–46.6)
LYMPH#: 2 10*3/uL (ref 0.9–3.3)
LYMPH%: 35.7 % (ref 14.0–48.0)
MCV: 101 fL (ref 81–101)
MONO#: 0.6 10*3/uL (ref 0.1–0.9)
Platelets: 149 10*3/uL (ref 145–400)
RBC: 3.93 10*6/uL (ref 3.70–5.32)
WBC: 5.6 10*3/uL (ref 3.9–10.0)

## 2011-12-09 NOTE — Progress Notes (Signed)
This office note has been dictated.

## 2011-12-10 LAB — LACTATE DEHYDROGENASE: LDH: 135 U/L (ref 94–250)

## 2011-12-10 LAB — COMPREHENSIVE METABOLIC PANEL
Albumin: 4.9 g/dL (ref 3.5–5.2)
CO2: 29 mEq/L (ref 19–32)
Calcium: 10.5 mg/dL (ref 8.4–10.5)
Chloride: 102 mEq/L (ref 96–112)
Glucose, Bld: 68 mg/dL — ABNORMAL LOW (ref 70–99)
Potassium: 4.1 mEq/L (ref 3.5–5.3)
Sodium: 138 mEq/L (ref 135–145)
Total Bilirubin: 0.6 mg/dL (ref 0.3–1.2)
Total Protein: 7.4 g/dL (ref 6.0–8.3)

## 2011-12-10 LAB — VITAMIN D 25 HYDROXY (VIT D DEFICIENCY, FRACTURES): Vit D, 25-Hydroxy: 64 ng/mL (ref 30–89)

## 2011-12-10 NOTE — Progress Notes (Signed)
CC:   Tamara [name], M.D.  DIAGNOSES: 1. 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 2. Ductal carcinoma in situ of the right breast.  CURRENT THERAPY:  Observation.  INTERIM HISTORY:  Tamara [name] comes in for followup.  We see her every 6 months.  She has had no problems.  She lives down in [place].  She and her husband had a good summer out there.  She has had no bony pain.  She has had no change in bowel or bladder habits.  She has had no leg swelling.  There have not been any rashes.  She has had no headache.  There has been no change in her medications.  She does take vitamin D.  PHYSICAL EXAMINATION:  This is a well-developed, well-nourished white female in no obvious distress.  Vital Signs: Temperature 97.7, pulse 68, respiratory rate 16, blood pressure 164/75, weight is 128.  Head and Neck:  Normocephalic, atraumatic skull.  There are no ocular or oral lesions.  There are no palpable cervical or supraclavicular lymph nodes. Lungs:  Clear bilaterally.  Cardiac:  Regular rate and rhythm with a normal S1 and S2.  There are no murmurs, rubs or bruits.  Breasts: Right breast with a well-healed lumpectomy at the 3 o'clock position. There is no distinct mass in the right breast.  There is some slight contraction at the lumpectomy site.  There is no right axillary adenopathy.  Left breast shows a well-healed lumpectomy at the 2 o'clock position.  No mass or erythema is noted in the left breast.  There is no left axillary adenopathy.  Abdomen:  Soft with good bowel sounds.  There is no palpable abdominal mass.  There is no palpable hepatosplenomegaly. Back:  No kyphosis.  Extremities:  No clubbing, cyanosis or edema. Neurological:  No focal neurological deficits.  LABORATORY STUDIES:  White cell count is 5.6, hemoglobin 13.8, hematocrit 39.5, platelet count 149.  IMPRESSION:  Tamara [name] is an 78 year old white female with a history of stage IIA ductal  carcinoma of the left breast.  This was diagnosed back in 2000.  This was triple negative.  She had 8 cycles of CMF. She then presented with a ductal carcinoma in situ of the right breast in May 2008.  She underwent lumpectomy and radiation.  She did not tolerate antiestrogen therapy well.  She is doing quite well from my point of view.  Will plan to get her back to see [place] in another 6 months.    ______________________________ Josph Macho, M.D. PRE/MEDQ  D:  12/09/2011  T:  12/10/2011  Job:  [id number]

## 2011-12-13 DIAGNOSIS — M5137 Other intervertebral disc degeneration, lumbosacral region: Secondary | ICD-10-CM | POA: Diagnosis not present

## 2011-12-13 DIAGNOSIS — M25559 Pain in unspecified hip: Secondary | ICD-10-CM | POA: Diagnosis not present

## 2011-12-13 DIAGNOSIS — M999 Biomechanical lesion, unspecified: Secondary | ICD-10-CM | POA: Diagnosis not present

## 2011-12-14 DIAGNOSIS — M5137 Other intervertebral disc degeneration, lumbosacral region: Secondary | ICD-10-CM | POA: Diagnosis not present

## 2011-12-14 DIAGNOSIS — M25559 Pain in unspecified hip: Secondary | ICD-10-CM | POA: Diagnosis not present

## 2011-12-14 DIAGNOSIS — M999 Biomechanical lesion, unspecified: Secondary | ICD-10-CM | POA: Diagnosis not present

## 2011-12-16 DIAGNOSIS — M25559 Pain in unspecified hip: Secondary | ICD-10-CM | POA: Diagnosis not present

## 2011-12-16 DIAGNOSIS — M5137 Other intervertebral disc degeneration, lumbosacral region: Secondary | ICD-10-CM | POA: Diagnosis not present

## 2011-12-16 DIAGNOSIS — M999 Biomechanical lesion, unspecified: Secondary | ICD-10-CM | POA: Diagnosis not present

## 2011-12-20 DIAGNOSIS — M5137 Other intervertebral disc degeneration, lumbosacral region: Secondary | ICD-10-CM | POA: Diagnosis not present

## 2011-12-20 DIAGNOSIS — M999 Biomechanical lesion, unspecified: Secondary | ICD-10-CM | POA: Diagnosis not present

## 2011-12-20 DIAGNOSIS — M25559 Pain in unspecified hip: Secondary | ICD-10-CM | POA: Diagnosis not present

## 2011-12-21 ENCOUNTER — Telehealth: Payer: Self-pay | Admitting: Hematology & Oncology

## 2011-12-21 DIAGNOSIS — M25559 Pain in unspecified hip: Secondary | ICD-10-CM | POA: Diagnosis not present

## 2011-12-21 DIAGNOSIS — M5137 Other intervertebral disc degeneration, lumbosacral region: Secondary | ICD-10-CM | POA: Diagnosis not present

## 2011-12-21 DIAGNOSIS — M999 Biomechanical lesion, unspecified: Secondary | ICD-10-CM | POA: Diagnosis not present

## 2011-12-21 NOTE — Telephone Encounter (Addendum)
Message copied by Cathi Roan on Tue Dec 21, 2011 11:51 AM ------      Message from: [place], [place] N      Created: Fri Dec 10, 2011 11:17 AM                   ----- Message -----         From: Josph Macho, MD         Sent: 12/09/2011   8:39 PM           To: Nanci Pina Nurse Hp            Call and send her the c-met.  Labs are ok.  Cindee Lame  12-21-11 @11 :38  Called patient and spoke with her husband regarding the above MD message, also informed him I would be mailing her a copy of lab results.  Lupita Raider LPN

## 2012-01-03 DIAGNOSIS — M999 Biomechanical lesion, unspecified: Secondary | ICD-10-CM | POA: Diagnosis not present

## 2012-01-03 DIAGNOSIS — M5137 Other intervertebral disc degeneration, lumbosacral region: Secondary | ICD-10-CM | POA: Diagnosis not present

## 2012-01-03 DIAGNOSIS — M25559 Pain in unspecified hip: Secondary | ICD-10-CM | POA: Diagnosis not present

## 2012-01-04 DIAGNOSIS — M25559 Pain in unspecified hip: Secondary | ICD-10-CM | POA: Diagnosis not present

## 2012-01-04 DIAGNOSIS — M999 Biomechanical lesion, unspecified: Secondary | ICD-10-CM | POA: Diagnosis not present

## 2012-01-04 DIAGNOSIS — M5137 Other intervertebral disc degeneration, lumbosacral region: Secondary | ICD-10-CM | POA: Diagnosis not present

## 2012-01-06 DIAGNOSIS — M25559 Pain in unspecified hip: Secondary | ICD-10-CM | POA: Diagnosis not present

## 2012-01-06 DIAGNOSIS — M5137 Other intervertebral disc degeneration, lumbosacral region: Secondary | ICD-10-CM | POA: Diagnosis not present

## 2012-01-06 DIAGNOSIS — M999 Biomechanical lesion, unspecified: Secondary | ICD-10-CM | POA: Diagnosis not present

## 2012-01-24 DIAGNOSIS — M5137 Other intervertebral disc degeneration, lumbosacral region: Secondary | ICD-10-CM | POA: Diagnosis not present

## 2012-01-24 DIAGNOSIS — M25559 Pain in unspecified hip: Secondary | ICD-10-CM | POA: Diagnosis not present

## 2012-01-24 DIAGNOSIS — M999 Biomechanical lesion, unspecified: Secondary | ICD-10-CM | POA: Diagnosis not present

## 2012-01-25 DIAGNOSIS — M5137 Other intervertebral disc degeneration, lumbosacral region: Secondary | ICD-10-CM | POA: Diagnosis not present

## 2012-01-25 DIAGNOSIS — M999 Biomechanical lesion, unspecified: Secondary | ICD-10-CM | POA: Diagnosis not present

## 2012-01-25 DIAGNOSIS — M25559 Pain in unspecified hip: Secondary | ICD-10-CM | POA: Diagnosis not present

## 2012-01-27 DIAGNOSIS — M25559 Pain in unspecified hip: Secondary | ICD-10-CM | POA: Diagnosis not present

## 2012-01-27 DIAGNOSIS — M999 Biomechanical lesion, unspecified: Secondary | ICD-10-CM | POA: Diagnosis not present

## 2012-01-27 DIAGNOSIS — M5137 Other intervertebral disc degeneration, lumbosacral region: Secondary | ICD-10-CM | POA: Diagnosis not present

## 2012-02-17 DIAGNOSIS — R42 Dizziness and giddiness: Secondary | ICD-10-CM | POA: Diagnosis not present

## 2012-02-17 DIAGNOSIS — L659 Nonscarring hair loss, unspecified: Secondary | ICD-10-CM | POA: Diagnosis not present

## 2012-02-17 DIAGNOSIS — M545 Low back pain: Secondary | ICD-10-CM | POA: Diagnosis not present

## 2012-02-17 DIAGNOSIS — R109 Unspecified abdominal pain: Secondary | ICD-10-CM | POA: Diagnosis not present

## 2012-05-05 DIAGNOSIS — J069 Acute upper respiratory infection, unspecified: Secondary | ICD-10-CM | POA: Diagnosis not present

## 2012-05-07 DIAGNOSIS — J029 Acute pharyngitis, unspecified: Secondary | ICD-10-CM | POA: Diagnosis not present

## 2012-05-19 DIAGNOSIS — Z1211 Encounter for screening for malignant neoplasm of colon: Secondary | ICD-10-CM | POA: Diagnosis not present

## 2012-05-23 DIAGNOSIS — K648 Other hemorrhoids: Secondary | ICD-10-CM | POA: Diagnosis not present

## 2012-05-23 DIAGNOSIS — R197 Diarrhea, unspecified: Secondary | ICD-10-CM | POA: Diagnosis not present

## 2012-05-31 ENCOUNTER — Other Ambulatory Visit: Payer: Self-pay | Admitting: Hematology & Oncology

## 2012-05-31 DIAGNOSIS — Z853 Personal history of malignant neoplasm of breast: Secondary | ICD-10-CM

## 2012-06-05 DIAGNOSIS — K648 Other hemorrhoids: Secondary | ICD-10-CM | POA: Diagnosis not present

## 2012-06-05 DIAGNOSIS — R197 Diarrhea, unspecified: Secondary | ICD-10-CM | POA: Diagnosis not present

## 2012-06-05 DIAGNOSIS — R195 Other fecal abnormalities: Secondary | ICD-10-CM | POA: Diagnosis not present

## 2012-06-06 DIAGNOSIS — K921 Melena: Secondary | ICD-10-CM | POA: Diagnosis not present

## 2012-06-07 DIAGNOSIS — Z538 Procedure and treatment not carried out for other reasons: Secondary | ICD-10-CM | POA: Diagnosis not present

## 2012-06-07 DIAGNOSIS — K921 Melena: Secondary | ICD-10-CM | POA: Diagnosis not present

## 2012-06-07 DIAGNOSIS — K5669 Other intestinal obstruction: Secondary | ICD-10-CM | POA: Diagnosis not present

## 2012-06-09 ENCOUNTER — Ambulatory Visit (HOSPITAL_BASED_OUTPATIENT_CLINIC_OR_DEPARTMENT_OTHER): Payer: Medicare Other | Admitting: Hematology & Oncology

## 2012-06-09 ENCOUNTER — Ambulatory Visit
Admission: RE | Admit: 2012-06-09 | Discharge: 2012-06-09 | Disposition: A | Discharge status: Home / Self Care | Payer: Medicare Other | Source: Ambulatory Visit | Attending: Hematology & Oncology | Admitting: Hematology & Oncology

## 2012-06-09 ENCOUNTER — Other Ambulatory Visit (HOSPITAL_BASED_OUTPATIENT_CLINIC_OR_DEPARTMENT_OTHER): Payer: Medicare Other | Admitting: Lab

## 2012-06-09 VITALS — BP 165/50 | HR 62 | Temp 97.6°F | Resp 16 | Ht 66.0 in | Wt 131.0 lb

## 2012-06-09 DIAGNOSIS — Z853 Personal history of malignant neoplasm of breast: Secondary | ICD-10-CM

## 2012-06-09 DIAGNOSIS — D0591 Unspecified type of carcinoma in situ of right breast: Secondary | ICD-10-CM

## 2012-06-09 DIAGNOSIS — D0590 Unspecified type of carcinoma in situ of unspecified breast: Secondary | ICD-10-CM | POA: Diagnosis not present

## 2012-06-09 DIAGNOSIS — C50919 Malignant neoplasm of unspecified site of unspecified female breast: Secondary | ICD-10-CM

## 2012-06-09 DIAGNOSIS — M810 Age-related osteoporosis without current pathological fracture: Secondary | ICD-10-CM

## 2012-06-09 DIAGNOSIS — D689 Coagulation defect, unspecified: Secondary | ICD-10-CM | POA: Diagnosis not present

## 2012-06-09 LAB — CBC WITH DIFFERENTIAL (CANCER CENTER ONLY)
BASO%: 0.6 % (ref 0.0–2.0)
EOS%: 1.7 % (ref 0.0–7.0)
LYMPH#: 1.7 10*3/uL (ref 0.9–3.3)
MCH: 33.6 pg (ref 26.0–34.0)
MCHC: 33.3 g/dL (ref 32.0–36.0)
MONO%: 14.9 % — ABNORMAL HIGH (ref 0.0–13.0)
NEUT#: 2.6 10*3/uL (ref 1.5–6.5)
Platelets: 172 10*3/uL (ref 145–400)
RDW: 12.3 % (ref 11.1–15.7)

## 2012-06-09 NOTE — Progress Notes (Signed)
This office note has been dictated.

## 2012-06-10 LAB — COMPREHENSIVE METABOLIC PANEL
ALT: 19 U/L (ref 0–35)
AST: 24 U/L (ref 0–37)
Albumin: 4.5 g/dL (ref 3.5–5.2)
Alkaline Phosphatase: 117 U/L (ref 39–117)
Calcium: 9.8 mg/dL (ref 8.4–10.5)
Chloride: 104 mEq/L (ref 96–112)
Potassium: 4.1 mEq/L (ref 3.5–5.3)
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
## 2012-06-13 ENCOUNTER — Telehealth: Payer: Self-pay | Admitting: Hematology & Oncology

## 2012-06-13 NOTE — Telephone Encounter (Addendum)
Message copied by Cathi Roan on Tue Jun 13, 2012  3:33 PM ------      Message from: Josph Macho      Created: Fri Jun 09, 2012  5:46 PM       Please call and tell her laboratory studies look okay. Please mail her the metabolic panel studies. Thanks. Cindee Lame  06-13-12 3:34   Called patient and left message on home phone regarding above MD message, advised if any question to call office.  Lupita Raider LPN ------

## 2012-07-11 DIAGNOSIS — K648 Other hemorrhoids: Secondary | ICD-10-CM | POA: Diagnosis not present

## 2012-07-11 DIAGNOSIS — D500 Iron deficiency anemia secondary to blood loss (chronic): Secondary | ICD-10-CM | POA: Diagnosis not present

## 2012-07-11 DIAGNOSIS — R195 Other fecal abnormalities: Secondary | ICD-10-CM | POA: Diagnosis not present

## 2012-07-11 DIAGNOSIS — R197 Diarrhea, unspecified: Secondary | ICD-10-CM | POA: Diagnosis not present

## 2012-07-11 DIAGNOSIS — K921 Melena: Secondary | ICD-10-CM | POA: Diagnosis not present

## 2012-07-31 DIAGNOSIS — Z1211 Encounter for screening for malignant neoplasm of colon: Secondary | ICD-10-CM | POA: Diagnosis not present

## 2012-08-24 ENCOUNTER — Emergency Department (HOSPITAL_COMMUNITY): Payer: Medicare Other

## 2012-08-24 ENCOUNTER — Encounter (HOSPITAL_COMMUNITY): Payer: Self-pay | Admitting: Emergency Medicine

## 2012-08-24 ENCOUNTER — Other Ambulatory Visit: Payer: Self-pay

## 2012-08-24 ENCOUNTER — Emergency Department (HOSPITAL_COMMUNITY)
Admission: EM | Admit: 2012-08-24 | Discharge: 2012-08-24 | Disposition: A | Discharge status: Home / Self Care | Payer: Medicare Other | Source: Home / Self Care | Attending: Emergency Medicine | Admitting: Emergency Medicine

## 2012-08-24 DIAGNOSIS — Z95818 Presence of other cardiac implants and grafts: Secondary | ICD-10-CM | POA: Diagnosis not present

## 2012-08-24 DIAGNOSIS — R55 Syncope and collapse: Secondary | ICD-10-CM | POA: Diagnosis not present

## 2012-08-24 DIAGNOSIS — I10 Essential (primary) hypertension: Secondary | ICD-10-CM | POA: Diagnosis not present

## 2012-08-24 DIAGNOSIS — G9389 Other specified disorders of brain: Secondary | ICD-10-CM | POA: Diagnosis not present

## 2012-08-24 DIAGNOSIS — I442 Atrioventricular block, complete: Secondary | ICD-10-CM | POA: Diagnosis not present

## 2012-08-24 DIAGNOSIS — R0989 Other specified symptoms and signs involving the circulatory and respiratory systems: Secondary | ICD-10-CM | POA: Diagnosis not present

## 2012-08-24 DIAGNOSIS — R233 Spontaneous ecchymoses: Secondary | ICD-10-CM | POA: Insufficient documentation

## 2012-08-24 DIAGNOSIS — I495 Sick sinus syndrome: Secondary | ICD-10-CM | POA: Diagnosis not present

## 2012-08-24 DIAGNOSIS — J9819 Other pulmonary collapse: Secondary | ICD-10-CM | POA: Diagnosis not present

## 2012-08-24 DIAGNOSIS — R109 Unspecified abdominal pain: Secondary | ICD-10-CM | POA: Diagnosis not present

## 2012-08-24 DIAGNOSIS — Z853 Personal history of malignant neoplasm of breast: Secondary | ICD-10-CM | POA: Diagnosis not present

## 2012-08-24 DIAGNOSIS — R0902 Hypoxemia: Secondary | ICD-10-CM | POA: Diagnosis not present

## 2012-08-24 DIAGNOSIS — E876 Hypokalemia: Secondary | ICD-10-CM | POA: Diagnosis not present

## 2012-08-24 DIAGNOSIS — I519 Heart disease, unspecified: Secondary | ICD-10-CM | POA: Diagnosis not present

## 2012-08-24 DIAGNOSIS — R110 Nausea: Secondary | ICD-10-CM | POA: Insufficient documentation

## 2012-08-24 DIAGNOSIS — J8410 Pulmonary fibrosis, unspecified: Secondary | ICD-10-CM | POA: Diagnosis not present

## 2012-08-24 DIAGNOSIS — R404 Transient alteration of awareness: Secondary | ICD-10-CM | POA: Diagnosis not present

## 2012-08-24 DIAGNOSIS — I455 Other specified heart block: Secondary | ICD-10-CM | POA: Diagnosis not present

## 2012-08-24 DIAGNOSIS — R42 Dizziness and giddiness: Secondary | ICD-10-CM | POA: Insufficient documentation

## 2012-08-24 DIAGNOSIS — F0390 Unspecified dementia without behavioral disturbance: Secondary | ICD-10-CM | POA: Diagnosis not present

## 2012-08-24 LAB — BASIC METABOLIC PANEL
CO2: 25 mEq/L (ref 19–32)
Chloride: 99 mEq/L (ref 96–112)
Glucose, Bld: 137 mg/dL — ABNORMAL HIGH (ref 70–99)
Potassium: 4.5 mEq/L (ref 3.5–5.1)
Sodium: 136 mEq/L (ref 135–145)

## 2012-08-24 LAB — CBC WITH DIFFERENTIAL/PLATELET
Eosinophils Relative: 0 % (ref 0–5)
Lymphocytes Relative: 14 % (ref 12–46)
Lymphs Abs: 1.1 10*3/uL (ref 0.7–4.0)
MCV: 90 fL (ref 78.0–100.0)
Neutro Abs: 6.6 10*3/uL (ref 1.7–7.7)
Platelets: 172 10*3/uL (ref 150–400)
RBC: 3.89 MIL/uL (ref 3.87–5.11)
WBC: 7.9 10*3/uL (ref 4.0–10.5)

## 2012-08-24 LAB — URINALYSIS, ROUTINE W REFLEX MICROSCOPIC
Glucose, UA: NEGATIVE mg/dL
Ketones, ur: 15 mg/dL — AB
Leukocytes, UA: NEGATIVE
Nitrite: NEGATIVE
Protein, ur: NEGATIVE mg/dL

## 2012-08-24 LAB — D-DIMER, QUANTITATIVE: D-Dimer, Quant: 0.65 ug/mL-FEU — ABNORMAL HIGH (ref 0.00–0.48)

## 2012-08-24 LAB — TROPONIN I: Troponin I: <0.3 ng/mL (ref <0.30)

## 2012-08-24 MED ORDER — IOHEXOL 350 MG/ML SOLN
100.0000 mL | Freq: Once | INTRAVENOUS | Status: AC | PRN
  Administered 2012-08-24: 100 mL via INTRAVENOUS

## 2012-08-24 MED ORDER — IOHEXOL 300 MG/ML  SOLN: 100.0000 mL | Freq: Once | INTRAMUSCULAR | Status: DC | PRN

## 2012-08-24 NOTE — ED Provider Notes (Signed)
Medical screening examination/treatment/procedure(s) were performed by non-physician practitioner and as supervising physician I was immediately available for consultation/collaboration.  Doug Sou, MD 08/24/12 [phone]

## 2012-08-24 NOTE — ED Provider Notes (Signed)
CSN: [id number]     Arrival date & time 08/24/12  1256 History     First MD Initiated Contact with Patient 08/24/12 1551     Chief Complaint  Patient presents with  . Loss of Consciousness   (Consider location/radiation/quality/duration/timing/severity/associated sxs/prior Treatment) HPI Comments: TEA COLLUMS is a 79 y.o. Female standing up to leave and office, after a visit with her financial planner; when she collapsed into the chair, and passed out. She was unconscious for a short period of time. She then awoke and was combative, by flailing her arms, for a short period of time. She then became nauseated, and her husband helped her to the floor. She stayed on the floor until EMS arrived. EMS found her to be hypertensive, started an IV, and transferred her to the ED. She feels back to her baseline now. She has not had any preceding illnesses in the last several days. She denies fever, chills, nausea, vomiting, weakness, or dizziness. She had a previous episode of syncope 3 years ago, was admitted to the hospital and discharged after findings were negative. She states that she is healthy and does not have any chronic medical problems. She has never been hypertensive. There are no other known modifying factors.  Patient is a 79 y.o. female presenting with syncope. The history is provided by the patient.  Loss of Consciousness   History reviewed. No pertinent past medical history. History reviewed. No pertinent past surgical history. History reviewed. No pertinent family history. History  Substance Use Topics  . Smoking status: Not on file  . Smokeless tobacco: Not on file  . Alcohol Use: Not on file   OB History   Grav Para Term Preterm Abortions TAB SAB Ect Mult Living                 Review of Systems  Cardiovascular: Positive for syncope.  All other systems reviewed and are negative.    Allergies  Codeine  Home Medications   Current Outpatient Rx  Name  Route  Sig   Dispense  Refill  . B Complex-C (SUPER B COMPLEX PO)   Oral   Take by mouth every other day.         . Cholecalciferol (VITAMIN D) 2000 UNITS tablet   Oral   Take 2,000 Units by mouth daily with breakfast.         . Cyanocobalamin (VITAMIN B-12 PO)   Oral   Take by mouth every other day.         Marland Kitchen GLUCOSAMINE-CHONDROITIN PO   Oral   Take 1 capsule by mouth daily with breakfast.         . Multiple Vitamin (MULTIVITAMIN WITH MINERALS) TABS   Oral   Take 1 tablet by mouth daily with breakfast.         . Multiple Vitamins-Minerals (ANTIOXIDANT FORMULA SG) capsule   Oral   Take 1 capsule by mouth daily.         . Omega-3 Fatty Acids (OMEGA 3 PO)   Oral   Take 1 capsule by mouth daily with breakfast.         . Probiotic Product (ALIGN PO)   Oral   Take 1 capsule by mouth daily with breakfast.          BP 167/64  Pulse 72  Temp(Src) 97.5 F (36.4 C) (Oral)  Resp 18  SpO2 99% Physical Exam  Nursing note and vitals reviewed. Constitutional: She is oriented to person,  place, and time. She appears well-developed.  Elderly, frail  HENT:  Head: Normocephalic and atraumatic.  Right Ear: External ear normal.  Left Ear: External ear normal.  Mouth/Throat: Oropharynx is clear and moist.  Eyes: Conjunctivae and EOM are normal. Pupils are equal, round, and reactive to light.  Neck: Normal range of motion and phonation normal. Neck supple.  Cardiovascular: Normal rate, regular rhythm and intact distal pulses.   Pulmonary/Chest: Effort normal and breath sounds normal. She exhibits no tenderness.  Abdominal: Soft. She exhibits no distension. There is no tenderness. There is no guarding.  Musculoskeletal: Normal range of motion. She exhibits no edema and no tenderness.  No deformity of arms or legs  Neurological: She is alert and oriented to person, place, and time. She has normal strength. She exhibits normal muscle tone.  Skin: Skin is warm and dry.  Petechiae  (tiny flat, punctate lesions) on both forearms, right greater than left, in a nonspecific pattern.  Psychiatric: She has a normal mood and affect. Her behavior is normal. Judgment and thought content normal.    ED Course   Procedures (including critical care time)  Patient Vitals for the past 24 hrs:  BP Temp Temp src Pulse Resp SpO2  08/24/12 1900 167/64 mmHg - - 72 - 99 %  08/24/12 1815 168/62 mmHg - - 70 - 96 %  08/24/12 1730 141/63 mmHg - - 77 - 100 %  08/24/12 1729 141/63 mmHg - - 76 18 -  08/24/12 1728 190/69 mmHg - - 72 18 -  08/24/12 1727 183/61 mmHg - - 61 - -  08/24/12 1318 203/75 mmHg 97.5 F (36.4 C) Oral 62 18 100 %     Date: 08/24/12  Rate: 60  Rhythm: normal sinus rhythm  QRS Axis: normal  PR and QT Intervals: normal  ST/T Wave abnormalities: normal  PR and QRS Conduction Disutrbances:none  Narrative Interpretation:   Old EKG Reviewed: none available  9:00 PM Reevaluation with update and discussion. After initial assessment and treatment, an updated evaluation reveals she remains comfortable. She is not short of breath. She has no chest pain. Her blood pressure has been intermittently elevated. It recovers to a near normal level, spontaneously. Tamarick Kovalcik L   Labs Reviewed  URINALYSIS, ROUTINE W REFLEX MICROSCOPIC - Abnormal; Notable for the following:    Ketones, ur 15 (*)    All other components within normal limits  CBC WITH DIFFERENTIAL - Abnormal; Notable for the following:    Hemoglobin 11.8 (*)    HCT 35.0 (*)    Neutrophils Relative % 83 (*)    Monocytes Relative 2 (*)    All other components within normal limits  BASIC METABOLIC PANEL - Abnormal; Notable for the following:    Glucose, Bld 137 (*)    GFR calc non Af Amer 81 (*)    All other components within normal limits  D-DIMER, QUANTITATIVE - Abnormal; Notable for the following:    D-Dimer, Quant 0.65 (*)    All other components within normal limits  URINE CULTURE  TROPONIN I   Dg Chest  2 View  08/24/2012   *RADIOLOGY REPORT*  Clinical Data: Loss of consciousness today.  Dizziness.  CHEST - 2 VIEW  Comparison: 04/04/2006.  Findings: The cardiac silhouette remains borderline enlarged.  The lungs remain clear with mildly prominent interstitial markings. Diffuse osteopenia.  Thoracic spine degenerative changes and mild scoliosis.  IMPRESSION: No acute abnormality.  Stable mild chronic interstitial lung disease.   Original Report  Authenticated By: Beckie Salts, M.D.   Ct Head Wo Contrast  08/24/2012   *RADIOLOGY REPORT*  Clinical Data:  History of syncopal episode, nausea, and vomiting.  CT HEAD WITHOUT CONTRAST  Technique: Contiguous axial images were obtained from the base of the skull through the vertex without contrast  Comparison:  None  Findings:  There is no evidence of brain mass, brain hemorrhage, or acute infarction.  The ventricular system is normal size and shape.  There is no evidence of shift of midline structures, parenchymal lesion, or subdural or epidural hematoma. There is minimal cortical atrophy. There is minimal microvascular white matter hypodensity.  The calvarium is intact.  Mastoids are well aerated.  No sinusitis is evident. There is a high density calcific density in a right ethmoid air cell probably reflecting small osteoma. It measures 5 x 4 mm.  No sinus expansion or bony destruction is evident.  No air fluid levels are seen.  IMPRESSION: There is no evidence of brain mass, brain hemorrhage, or acute infarction.  No acute or active brain process is seen.  No skull lesion is evident. Minimal cortical atrophic changes are seen.  Minimal periventricular and white matter hypodensities are present consistent with chronic microvascular changes.  No sinusitis is evident. There is a high density calcific density in a right ethmoid air cell probably reflecting small osteoma.  No sinus expansion or bony destruction is evident.  No air fluid levels are seen.   Original Report  Authenticated By: Onalee Hua Call   1. Syncope     MDM  Syncope without clear cause. She is mild d-dimer elevation that is possibly consistent with age-related elevation. I discussed this with the patient. She is now comfortable, without a definitive test, for PE. While there is some support for not doing PE with this finding, this is not a universal practice pattern. She will be evaluated for PE, prior to leaving. Her blood pressure is not causing hypertensive urgency. This can be evaluated and treated as an outpatient.  Nursing Notes Reviewed/ Care Coordinated, and agree without changes. Applicable Imaging Reviewed.  Interpretation of Laboratory Data incorporated into ED treatment  Plan: If her CT angiogram Chest is negative for PE. She can followup with her PCP in 1 week for a checkup and reevaluation of blood pressure.  Disposition; as per oncoming provider treatment team   Flint Melter, MD 08/25/12 1000

## 2012-08-24 NOTE — ED Notes (Signed)
Pt arrives to ed via [facility] for syncopal episode pta. ems sts pt at financial planner and loc. Pt caox4 upon [facility] arrival. Pt vomiting since ems arrival. No blood.  Pt husband sts no recent illness/injury or changes in diet/meds.  Pt caox4, nad.

## 2012-08-24 NOTE — ED Provider Notes (Signed)
This is a signout from Dr. Effie Shy at shift change: ZYAN MIRKIN is a 79 y.o. female with no significant past medical history presenting with syncopal episode after going from sitting to standing at her financial advisor's office earlier in the day. Patient's workup has thus far been negative except for directly high blood pressure. Plan is to obtain CTA secondary to mildly elevated d-dimer.  Patient seen and examined at the bedside, she is resting comfortably, lung sounds are clear to auscultation bilaterally. Discussed results of her CTA which showed no pulmonary embolism or acute abnormalities. Patient is reassured, all questions answered. She has asked how to get these records to her primary care physician I have asked the charge nurse to discuss with her the best way to expedite records transfer.  PCP Nevil Elna Breslow Vitals:   08/24/12 1729 08/24/12 1730 08/24/12 1815 08/24/12 1900  BP: 141/63 141/63 168/62 167/64  Pulse: 76 77 70 72  Temp:      TempSrc:      Resp: 18     SpO2:  100% 96% 99%   Pt is hemodynamically stable, appropriate for, and amenable to discharge at this time. Pt verbalized understanding and agrees with care plan. Outpatient follow-up and specific return precautions discussed.     Wynetta Emery, PA-C 08/24/12 [phone]

## 2012-08-25 DIAGNOSIS — R55 Syncope and collapse: Secondary | ICD-10-CM

## 2012-08-25 DIAGNOSIS — I10 Essential (primary) hypertension: Secondary | ICD-10-CM

## 2012-08-25 HISTORY — DX: Essential (primary) hypertension: I10

## 2012-08-25 HISTORY — DX: Syncope and collapse: R55

## 2012-08-25 LAB — URINE CULTURE: Colony Count: 50000

## 2012-08-26 ENCOUNTER — Emergency Department (HOSPITAL_COMMUNITY): Payer: Medicare Other

## 2012-08-26 ENCOUNTER — Ambulatory Visit (HOSPITAL_COMMUNITY): Admit: 2012-08-26 | Payer: Self-pay | Admitting: Cardiovascular Disease

## 2012-08-26 ENCOUNTER — Encounter (HOSPITAL_COMMUNITY)
Admission: EM | Disposition: A | Discharge status: Home / Self Care | Payer: Self-pay | Source: Home / Self Care | Attending: Cardiovascular Disease

## 2012-08-26 ENCOUNTER — Inpatient Hospital Stay (HOSPITAL_COMMUNITY)
Admission: EM | Admit: 2012-08-26 | Discharge: 2012-08-30 | DRG: 244 | Disposition: A | Discharge status: Home / Self Care | Payer: Medicare Other | Attending: Cardiovascular Disease | Admitting: Cardiovascular Disease

## 2012-08-26 ENCOUNTER — Encounter (HOSPITAL_COMMUNITY): Payer: Self-pay | Admitting: Physical Medicine and Rehabilitation

## 2012-08-26 DIAGNOSIS — F0390 Unspecified dementia without behavioral disturbance: Secondary | ICD-10-CM | POA: Diagnosis present

## 2012-08-26 DIAGNOSIS — Z950 Presence of cardiac pacemaker: Secondary | ICD-10-CM | POA: Diagnosis not present

## 2012-08-26 DIAGNOSIS — I495 Sick sinus syndrome: Secondary | ICD-10-CM | POA: Diagnosis present

## 2012-08-26 DIAGNOSIS — C50919 Malignant neoplasm of unspecified site of unspecified female breast: Secondary | ICD-10-CM | POA: Diagnosis present

## 2012-08-26 DIAGNOSIS — E876 Hypokalemia: Secondary | ICD-10-CM | POA: Diagnosis present

## 2012-08-26 DIAGNOSIS — R55 Syncope and collapse: Secondary | ICD-10-CM | POA: Diagnosis present

## 2012-08-26 DIAGNOSIS — R42 Dizziness and giddiness: Secondary | ICD-10-CM | POA: Diagnosis not present

## 2012-08-26 DIAGNOSIS — IMO0001 Reserved for inherently not codable concepts without codable children: Secondary | ICD-10-CM | POA: Diagnosis not present

## 2012-08-26 DIAGNOSIS — I455 Other specified heart block: Secondary | ICD-10-CM | POA: Diagnosis not present

## 2012-08-26 DIAGNOSIS — R109 Unspecified abdominal pain: Secondary | ICD-10-CM | POA: Diagnosis not present

## 2012-08-26 DIAGNOSIS — I442 Atrioventricular block, complete: Secondary | ICD-10-CM

## 2012-08-26 DIAGNOSIS — S300XXA Contusion of lower back and pelvis, initial encounter: Secondary | ICD-10-CM

## 2012-08-26 DIAGNOSIS — Z853 Personal history of malignant neoplasm of breast: Secondary | ICD-10-CM

## 2012-08-26 HISTORY — PX: LEFT HEART CATHETERIZATION WITH CORONARY ANGIOGRAM: SHX5451

## 2012-08-26 HISTORY — DX: Malignant neoplasm of unspecified site of unspecified female breast: C50.919

## 2012-08-26 LAB — URINALYSIS, ROUTINE W REFLEX MICROSCOPIC
Bilirubin Urine: NEGATIVE
Glucose, UA: NEGATIVE mg/dL
Hgb urine dipstick: NEGATIVE
Ketones, ur: 40 mg/dL — AB
Leukocytes, UA: NEGATIVE
Nitrite: NEGATIVE
Protein, ur: NEGATIVE mg/dL
Specific Gravity, Urine: 1.007 (ref 1.005–1.030)
Urobilinogen, UA: 0.2 mg/dL (ref 0.0–1.0)
pH: 7 (ref 5.0–8.0)

## 2012-08-26 LAB — CBC WITH DIFFERENTIAL/PLATELET
Basophils Absolute: 0 10*3/uL (ref 0.0–0.1)
Basophils Relative: 0 % (ref 0–1)
Eosinophils Absolute: 0 10*3/uL (ref 0.0–0.7)
Eosinophils Relative: 0 % (ref 0–5)
HCT: 30.9 % — ABNORMAL LOW (ref 36.0–46.0)
Hemoglobin: 10.7 g/dL — ABNORMAL LOW (ref 12.0–15.0)
Lymphocytes Relative: 15 % (ref 12–46)
Lymphs Abs: 0.8 10*3/uL (ref 0.7–4.0)
MCH: 30.6 pg (ref 26.0–34.0)
MCHC: 34.6 g/dL (ref 30.0–36.0)
MCV: 88.3 fL (ref 78.0–100.0)
Monocytes Absolute: 0.4 10*3/uL (ref 0.1–1.0)
Monocytes Relative: 8 % (ref 3–12)
Neutro Abs: 4.2 10*3/uL (ref 1.7–7.7)
Neutrophils Relative %: 77 % (ref 43–77)
Platelets: 162 10*3/uL (ref 150–400)
RBC: 3.5 MIL/uL — ABNORMAL LOW (ref 3.87–5.11)
RDW: 13.3 % (ref 11.5–15.5)
WBC: 5.4 10*3/uL (ref 4.0–10.5)

## 2012-08-26 LAB — COMPREHENSIVE METABOLIC PANEL
ALT: 19 U/L (ref 0–35)
AST: 27 U/L (ref 0–37)
Albumin: 3.5 g/dL (ref 3.5–5.2)
Alkaline Phosphatase: 115 U/L (ref 39–117)
BUN: 8 mg/dL (ref 6–23)
CO2: 19 mEq/L (ref 19–32)
Calcium: 8.8 mg/dL (ref 8.4–10.5)
Chloride: 95 mEq/L — ABNORMAL LOW (ref 96–112)
Creatinine, Ser: 0.47 mg/dL — ABNORMAL LOW (ref 0.50–1.10)
GFR calc Af Amer: >90 mL/min (ref >90)
GFR calc non Af Amer: >90 mL/min (ref >90)
Glucose, Bld: 123 mg/dL — ABNORMAL HIGH (ref 70–99)
Potassium: 3.3 mEq/L — ABNORMAL LOW (ref 3.5–5.1)
Sodium: 128 mEq/L — ABNORMAL LOW (ref 135–145)
Total Bilirubin: 0.4 mg/dL (ref 0.3–1.2)
Total Protein: 6.5 g/dL (ref 6.0–8.3)

## 2012-08-26 LAB — MAGNESIUM: Magnesium: 1.9 mg/dL (ref 1.5–2.5)

## 2012-08-26 LAB — TROPONIN I: Troponin I: <0.3 ng/mL (ref <0.30)

## 2012-08-26 SURGERY — LEFT HEART CATHETERIZATION WITH CORONARY ANGIOGRAM
Anesthesia: Choice | Laterality: Bilateral

## 2012-08-26 MED ORDER — POTASSIUM CHLORIDE 10 MEQ/100ML IV SOLN
10.0000 meq | INTRAVENOUS | Status: AC
  Administered 2012-08-26 (×2): 10 meq via INTRAVENOUS
  Filled 2012-08-26 (×2): qty 100

## 2012-08-26 MED ORDER — POTASSIUM CHLORIDE CRYS ER 20 MEQ PO TBCR
30.0000 meq | EXTENDED_RELEASE_TABLET | Freq: Once | ORAL | Status: DC
  Filled 2012-08-26: qty 2

## 2012-08-26 MED ORDER — SODIUM CHLORIDE 0.9 % IV BOLUS (SEPSIS)
1000.0000 mL | Freq: Once | INTRAVENOUS | Status: AC
  Administered 2012-08-26: 1000 mL via INTRAVENOUS

## 2012-08-26 MED ORDER — ACETAMINOPHEN 325 MG PO TABS
650.0000 mg | ORAL_TABLET | ORAL | Status: DC | PRN
  Administered 2012-08-28 (×2): 650 mg via ORAL
  Filled 2012-08-26 (×2): qty 2

## 2012-08-26 MED ORDER — SODIUM CHLORIDE 0.9 % IV SOLN: 250.0000 mL | INTRAVENOUS | Status: DC | PRN

## 2012-08-26 MED ORDER — VITAMIN D 1000 UNITS PO TABS
2000.0000 [IU] | ORAL_TABLET | Freq: Every day | ORAL | Status: DC
  Administered 2012-08-27 – 2012-08-29 (×2): 2000 [IU] via ORAL
  Filled 2012-08-26 (×5): qty 2

## 2012-08-26 MED ORDER — SODIUM CHLORIDE 0.9 % IJ SOLN: 3.0000 mL | INTRAMUSCULAR | Status: DC | PRN

## 2012-08-26 MED ORDER — SODIUM CHLORIDE 0.9 % IJ SOLN
3.0000 mL | Freq: Two times a day (BID) | INTRAMUSCULAR | Status: DC
  Administered 2012-08-28: 3 mL via INTRAVENOUS

## 2012-08-26 MED ORDER — HEPARIN (PORCINE) IN NACL 2-0.9 UNIT/ML-% IJ SOLN
INTRAMUSCULAR | Status: AC
  Filled 2012-08-26: qty 500

## 2012-08-26 MED ORDER — SODIUM CHLORIDE 0.9 % IJ SOLN
3.0000 mL | Freq: Two times a day (BID) | INTRAMUSCULAR | Status: DC
  Administered 2012-08-27 – 2012-08-29 (×5): 3 mL via INTRAVENOUS

## 2012-08-26 MED ORDER — LIDOCAINE HCL (PF) 1 % IJ SOLN
INTRAMUSCULAR | Status: AC
  Filled 2012-08-26: qty 30

## 2012-08-26 MED ORDER — ONDANSETRON HCL 4 MG/2ML IJ SOLN: 4.0000 mg | Freq: Four times a day (QID) | INTRAMUSCULAR | Status: DC | PRN

## 2012-08-26 MED ORDER — HEPARIN SODIUM (PORCINE) 5000 UNIT/ML IJ SOLN
5000.0000 [IU] | Freq: Three times a day (TID) | INTRAMUSCULAR | Status: DC
  Filled 2012-08-26: qty 1

## 2012-08-26 MED ORDER — ONDANSETRON HCL 4 MG/2ML IJ SOLN
4.0000 mg | Freq: Once | INTRAMUSCULAR | Status: AC
  Administered 2012-08-26: 4 mg via INTRAVENOUS
  Filled 2012-08-26: qty 2

## 2012-08-26 NOTE — ED Notes (Signed)
Kohut MD aware of Asystole/Bradycardia. PT place on Zoll pads

## 2012-08-26 NOTE — ED Notes (Signed)
MD at bedside. Cardiology 

## 2012-08-26 NOTE — H&P (Signed)
.    Pt was reexamined and existing H & P reviewed. No changes found.  Runell Gess, MD [facility] 08/26/2012 10:25 PM

## 2012-08-26 NOTE — ED Notes (Signed)
MD at bedside. Kohut MD 

## 2012-08-26 NOTE — CV Procedure (Signed)
ARACELY RICKETT is a 79 y.o. female    [id number] LOCATION:  FACILITY: [facility]  PHYSICIAN: Nanetta Batty, M.D. 21-May-1929   DATE OF PROCEDURE:  08/26/2012  DATE OF DISCHARGE:   CARDIAC CATHETERIZATION     History obtained from chart review.   PROCEDURE DESCRIPTION: Ms. Lempke is an 79 year old moderately demented married Caucasian female whose brother Angelica Pou is a patient of mine. She has been having episodes of syncope. She is brought to the emergency room where on telemetry without found to have a 7 sec pauses requiring transcutaneous pacing. She is on no medications that would cause this. She presents now emergently for placement of a temporary transvenous pacemaker via the right common femoral venous approach.  The patient was brought to the second floor [facility] cath lab in the postabsorptive state. She was not  premedicated . Her right groinwas prepped and shaved in usual sterile fashion. Xylocaine 1% was used for local anesthesia. A 5 French sheath was inserted into the right common nameusing standard Seldinger technique. A 5 French balloon tip temporary transvenous pacemaker was then inserted through the femoral vein and placed under fluoroscopic guidance in the RV apex. The pacemaker was then tested and thresholds were obtained to an  MA of 2. He was set up for backup of 60 beats per minute. The pacemaker was then secured in place. The patient left the Cath Lab in stable condition.   HEMODYNAMICS:    AO SYSTOLIC/AO DIASTOLIC: 193/100    IMPRESSION:complete heart block with syncope. Status post permanent transvenous pacemaker insertion. Patient will need a permanent transvenous pacemaker which will be implanted on Monday by Dr. Royann Shivers.  Runell Gess MD, [facility] 08/26/2012 10:55 PM

## 2012-08-26 NOTE — ED Notes (Signed)
Pt presents to department for evaluation of near syncope. States she passed out on Thursday at bank, was seen and discharged home. Now states symptoms have returned, states she feels very dizzy and weak. Pt is alert and answering questions appropriately.

## 2012-08-26 NOTE — H&P (Signed)
Admit date: 08/26/2012 Referring Physician Dr. Juleen China Primary Cardiologist NONE Chief complaint/reason for admission: Syncope with sinus arrest  WGN:FAOZ is an 79yo F with pre-syncopal symptoms recently and then syncope while in triage. She apparently was evaluated for somewhat similar symptoms this past Thursday. According to her husband, they had been at the financial planner's office and she was feeling fine and then all of a sudden had syncope sitting in the chair and was out a few minutes.  She went to the ER and had fairly unremarkable w/u aside from mildly elevated d-dimer but subsequent CT angio neg for PE or other concerning pathology. Pt states that has been generally fatigued for the past few days. Poor appetite. Has felt "sick in the head." Describes this as a mild posterior HA associated with nausea. Intermittent crampy lower abdominal pain as if she has to have a bowel movement. No urinary complaints. Chills. No fever. Denies trauma. Denies hx of CAD.  She decided to come back to the ER because she was not feeling well.  In the triage area the triage nurse witnessed a syncopal episode with subsequent syncopal convulsions. Pt was seated when she began to feel lightheaded as if she may pass out. Nurse says she looked pale and was diaphoretic. Unresponsive for 10-15 seconds. She had some jerking motions in upper extremities. Returned to baseline and answering questions appropriately within a couple minutes. No incontinence or oral trauma. Pt denies preceding CP. She had 2 more syncopal episodes in the ER in the setting of sinus pauses up to 6 seconds.     PMH:   Breast CA s/p chemotherapy and XRT PSH:   Cholecystectomy, tonsillectomy, right breast lumpectomy  ALLERGIES:   Codeine  Prior to Admit Meds:  MVI's Family HX:   Mother died of Alzheimer's, Father died of kidney disease Social HX:    History   Social History  . Marital Status: Married    Spouse Name: N/A    Number of Children: N/A   . Years of Education: N/A   Occupational History  . Not on file.   Social History Main Topics  . Smoking status: Never Smoker   . Smokeless tobacco: Not on file  . Alcohol Use: No  . Drug Use: No  . Sexually Active: Not on file   Other Topics Concern  . Not on file   Social History Narrative  . No narrative on file     ROS:  All 11 ROS were addressed and are negative except what is stated in the HPI  PHYSICAL EXAM Filed Vitals:   08/26/12 1931  BP: 191/69  Pulse: 74  Temp:   Resp: 25   General: Well developed, well nourished, in no acute distress Head: Eyes PERRLA, No xanthomas.   Normal cephalic and atramatic  Lungs:   Clear bilaterally to auscultation and percussion. Heart:   HRRR S1 S2 Pulses are 2+ & equal.            No carotid bruit. No JVD.  No abdominal bruits. No femoral bruits. Abdomen: Bowel sounds are positive, abdomen soft and non-tender without masses  Extremities:   No clubbing, cyanosis or edema.  DP +1 Neuro: Alert and oriented X 3. Psych:  Good affect, responds appropriately   Labs:   Lab Results  Component Value Date   WBC 5.4 08/26/2012   HGB 10.7* 08/26/2012   HCT 30.9* 08/26/2012   MCV 88.3 08/26/2012   PLT 162 08/26/2012    Recent Labs  Lab 08/26/12 1639  NA 128*  K 3.3*  CL 95*  CO2 19  BUN 8  CREATININE 0.47*  CALCIUM 8.8  PROT 6.5  BILITOT 0.4  ALKPHOS 115  ALT 19  AST 27  GLUCOSE 123*   Lab Results  Component Value Date   TROPONINI <0.30 08/26/2012       Radiology:  *RADIOLOGY REPORT*  Clinical Data: Loss of consciousness today. Dizziness.  CHEST - 2 VIEW  Comparison: 04/04/2006.  Findings: The cardiac silhouette remains borderline enlarged. The  lungs remain clear with mildly prominent interstitial markings.  Diffuse osteopenia. Thoracic spine degenerative changes and mild  scoliosis.  IMPRESSION:  No acute abnormality. Stable mild chronic interstitial lung  disease.  Original Report Authenticated By: Beckie Salts,  M.D.   EKG:  NSR with no ST changes EKG tele strip with 4 episodes of sinus arrest up to 15 seconds  ASSESSMENT:  1.  Syncope secondary to sick sinus syndrome 2.  Sick sinus syndrome with long sinus pauses 3.  Hypokalemia 4.  Remote breast CA  PLAN:   1.  Admit to CCU 2.  Temporary PPM by Dr. Allyson Sabal 3.  Replete potassium - in progress in ER 4.  Repeat BMET in am 5.  Gentle IVF hydration 6.  Check 2D echo 7.  TSH 8.  PPM on Monday by EP  Quintella Reichert, MD  08/26/2012  8:28 PM

## 2012-08-26 NOTE — ED Provider Notes (Signed)
CSN: [id number]     Arrival date & time 08/26/12  1528 History     First MD Initiated Contact with Patient 08/26/12 1538     Chief Complaint  Patient presents with  . Near Syncope  . Dizziness   (Consider location/radiation/quality/duration/timing/severity/associated sxs/prior Treatment) HPI  83yF with pre-syncopal symptoms recently and then syncope while in triage. Triage nurse questioned her having a seizure, but description sounds more consistent with syncope and subsequent syncopal convulsions. Pt was seated when she began to feel lightheaded as if she may pass out. Nurse says she looked pale and was diaphoretic. Unresponsive for 10-15 seconds. Had some jerking motions in upper extremities. Return to baseline and answering questions appropriately within a couple minutes. No incontinence or oral trauma. Pt denies preceding CP. Pt recently evaluated for somewhat similar symptoms. Had fairly unremarkable w/u aside from mildly elevated d-dimer but subsequent CT angio neg for PE or other concerning pathology. Pt states that has been generally fatigued for the past few days. Poor appetite. Has felt "sick in the head." Describes this as a mild posterior HA associated with nausea. Intermittent crampy lower abdominal pain as if she has to have a bowel movement. No urinary complaints. Chills. No fever. Denies trauma. Denies hx of CAD. Significant other concerned because pt "takes a lot of vitamins." Pt denies   No past medical history on file. No past surgical history on file. No family history on file. History  Substance Use Topics  . Smoking status: Never Smoker   . Smokeless tobacco: Not on file  . Alcohol Use: No   OB History   Grav Para Term Preterm Abortions TAB SAB Ect Mult Living                 Review of Systems  All systems reviewed and negative, other than as noted in HPI.   Allergies  Codeine  Home Medications   Current Outpatient Rx  Name  Route  Sig  Dispense  Refill   . B Complex-C (SUPER B COMPLEX PO)   Oral   Take by mouth every other day.         . Cholecalciferol (VITAMIN D) 2000 UNITS tablet   Oral   Take 2,000 Units by mouth daily with breakfast.         . Cyanocobalamin (VITAMIN B-12 PO)   Oral   Take by mouth every other day.         Marland Kitchen GLUCOSAMINE-CHONDROITIN PO   Oral   Take 1 capsule by mouth daily with breakfast.         . Multiple Vitamin (MULTIVITAMIN WITH MINERALS) TABS   Oral   Take 1 tablet by mouth daily with breakfast.         . Multiple Vitamins-Minerals (ANTIOXIDANT FORMULA SG) capsule   Oral   Take 1 capsule by mouth daily.         . Omega-3 Fatty Acids (OMEGA 3 PO)   Oral   Take 1 capsule by mouth daily with breakfast.         . Probiotic Product (ALIGN PO)   Oral   Take 1 capsule by mouth daily with breakfast.          BP 191/102  Pulse 71  Temp(Src) 97.5 F (36.4 C) (Oral)  Resp 22  SpO2 99% Physical Exam  Nursing note and vitals reviewed. Constitutional: She is oriented to person, place, and time. She appears well-developed and well-nourished. No distress.  HENT:  Head: Normocephalic and atraumatic.  Eyes: Conjunctivae are normal. Pupils are equal, round, and reactive to light. Right eye exhibits no discharge. Left eye exhibits no discharge.  Neck: Normal range of motion. Neck supple.  No nuchal rigidity  Cardiovascular: Normal rate, regular rhythm and normal heart sounds.  Exam reveals no gallop and no friction rub.   No murmur heard. Pulmonary/Chest: Effort normal and breath sounds normal. No respiratory distress.  Abdominal: Soft. She exhibits no distension. There is tenderness. There is no rebound and no guarding.  Mild diffuse abdominal tenderness w/o guarding/rebound. Palpable abdominal aorta, but does not feel aneurysmal.   Musculoskeletal: She exhibits no edema and no tenderness.  Lower extremities symmetric as compared to each other. No calf tenderness. Negative Homan's. No  palpable cords.   Neurological: She is alert and oriented to person, place, and time. No cranial nerve deficit. She exhibits normal muscle tone. Coordination normal.  Skin: Skin is warm and dry.  Psychiatric: She has a normal mood and affect. Her behavior is normal. Thought content normal.    ED Course   Procedures (including critical care time)  Labs Reviewed  CBC WITH DIFFERENTIAL  COMPREHENSIVE METABOLIC PANEL  URINALYSIS, ROUTINE W REFLEX MICROSCOPIC   Dg Chest 2 View  08/24/2012   *RADIOLOGY REPORT*  Clinical Data: Loss of consciousness today.  Dizziness.  CHEST - 2 VIEW  Comparison: 04/04/2006.  Findings: The cardiac silhouette remains borderline enlarged.  The lungs remain clear with mildly prominent interstitial markings. Diffuse osteopenia.  Thoracic spine degenerative changes and mild scoliosis.  IMPRESSION: No acute abnormality.  Stable mild chronic interstitial lung disease.   Original Report Authenticated By: Beckie Salts, M.D.   Ct Head Wo Contrast  08/24/2012   *RADIOLOGY REPORT*  Clinical Data:  History of syncopal episode, nausea, and vomiting.  CT HEAD WITHOUT CONTRAST  Technique: Contiguous axial images were obtained from the base of the skull through the vertex without contrast  Comparison:  None  Findings:  There is no evidence of brain mass, brain hemorrhage, or acute infarction.  The ventricular system is normal size and shape.  There is no evidence of shift of midline structures, parenchymal lesion, or subdural or epidural hematoma. There is minimal cortical atrophy. There is minimal microvascular white matter hypodensity.  The calvarium is intact.  Mastoids are well aerated.  No sinusitis is evident. There is a high density calcific density in a right ethmoid air cell probably reflecting small osteoma. It measures 5 x 4 mm.  No sinus expansion or bony destruction is evident.  No air fluid levels are seen.  IMPRESSION: There is no evidence of brain mass, brain hemorrhage,  or acute infarction.  No acute or active brain process is seen.  No skull lesion is evident. Minimal cortical atrophic changes are seen.  Minimal periventricular and white matter hypodensities are present consistent with chronic microvascular changes.  No sinusitis is evident. There is a high density calcific density in a right ethmoid air cell probably reflecting small osteoma.  No sinus expansion or bony destruction is evident.  No air fluid levels are seen.   Original Report Authenticated By: Onalee Hua Call   Ct Angio Chest Pe W/cm &/or Wo Cm  08/24/2012   *RADIOLOGY REPORT*  Clinical Data: Syncope and nausea.  CT ANGIOGRAPHY CHEST  Technique:  Multidetector CT imaging of the chest using the standard protocol during bolus administration of intravenous contrast. Multiplanar reconstructed images including MIPs were obtained and reviewed to evaluate the vascular anatomy.  Contrast: OMNIPAQUE IOHEXOL 350 MG/ML SOLN  Comparison: None.  Findings: The pulmonary arteries are well opacified.  There is no evidence of pulmonary embolism.  The thoracic aorta is of normal caliber.  Parenchymal scarring and atelectasis is noted in both lungs.  There is no evidence of pulmonary edema, consolidation or nodule.  No pleural or pericardial fluid is seen.  No enlarged lymph nodes are identified.  The bony thorax shows a pectus excavatum deformity.  Mild osteophytes are present in the thoracic spine.  IMPRESSION: No evidence of pulmonary embolism or other acute findings.   Original Report Authenticated By: Irish Lack, M.D.   EKG:  Rhythm: normal sinus Rate: 75 Intervals: normal ST segments: normal   Diagnoses: 1. Syncope 2. Sick Sinus Syndrome 3  Hypokalemia  MDM  83yF with recurrent syncope. Pt actually with another event in ED while was speaking with medicine team. Monitor showed episode of fairly rapid sinus tach followed by a prolonged pause. Back to NSR w/o intervention.  7:39 PM Pt with yet agin  another long sinus pause of ~6 seconds. Will discuss with cardiology. Pads on pt.   Raeford Razor, MD 08/26/12 2127

## 2012-08-26 NOTE — ED Notes (Addendum)
Pt became diaphoretic, then had seizure while in triage. Pt moved to exam room, she is now alert, but very weak.

## 2012-08-27 DIAGNOSIS — I455 Other specified heart block: Principal | ICD-10-CM

## 2012-08-27 DIAGNOSIS — I519 Heart disease, unspecified: Secondary | ICD-10-CM

## 2012-08-27 DIAGNOSIS — R55 Syncope and collapse: Secondary | ICD-10-CM

## 2012-08-27 LAB — COMPREHENSIVE METABOLIC PANEL
ALT: 21 U/L (ref 0–35)
AST: 33 U/L (ref 0–37)
Albumin: 3.7 g/dL (ref 3.5–5.2)
CO2: 19 mEq/L (ref 19–32)
Calcium: 9.4 mg/dL (ref 8.4–10.5)
Creatinine, Ser: 0.48 mg/dL — ABNORMAL LOW (ref 0.50–1.10)
Sodium: 128 mEq/L — ABNORMAL LOW (ref 135–145)

## 2012-08-27 LAB — TROPONIN I
Troponin I: <0.3 ng/mL (ref <0.30)
Troponin I: <0.3 ng/mL (ref <0.30)
Troponin I: <0.3 ng/mL (ref <0.30)

## 2012-08-27 LAB — MRSA PCR SCREENING: MRSA by PCR: NEGATIVE

## 2012-08-27 MED ORDER — CEFAZOLIN SODIUM 10 G IJ SOLR
3.0000 g | INTRAMUSCULAR | Status: AC
  Administered 2012-08-28: 3 g via INTRAVENOUS
  Filled 2012-08-27: qty 3000

## 2012-08-27 MED ORDER — DIAZEPAM 5 MG PO TABS
5.0000 mg | ORAL_TABLET | ORAL | Status: DC
  Filled 2012-08-27: qty 1

## 2012-08-27 MED ORDER — SODIUM CHLORIDE 0.9 % IV SOLN
INTRAVENOUS | Status: DC
  Administered 2012-08-28: 06:00:00 via INTRAVENOUS

## 2012-08-27 MED ORDER — SODIUM CHLORIDE 0.9 % IR SOLN
80.0000 mg | Status: DC
  Filled 2012-08-27: qty 2

## 2012-08-27 NOTE — Progress Notes (Signed)
Subjective:  No CP/SOB  Objective:  Temp:  [97.4 F (36.3 C)-98 F (36.7 C)] 97.4 F (36.3 C) (08/03 0847) Pulse Rate:  [66-87] 66 (08/03 0900) Resp:  [13-27] 16 (08/03 0900) BP: (107-192)/(39-102) 157/64 mmHg (08/03 0900) SpO2:  [95 %-100 %] 99 % (08/03 0900) Weight:  [122 lb 9.2 oz (55.6 kg)] 122 lb 9.2 oz (55.6 kg) (08/02 2345) Weight change:   Intake/Output from previous day: 08/02 0701 - 08/03 0700 In: 160 [I.V.:160] Out: 1000 [Urine:1000]  Intake/Output from this shift: Total I/O In: 40 [I.V.:40] Out: -   Physical Exam: General appearance: alert, cooperative and mild distress Neck: no adenopathy, no carotid bruit, no JVD, supple, symmetrical, trachea midline and thyroid not enlarged, symmetric, no tenderness/mass/nodules Lungs: clear to auscultation bilaterally Heart: regular rate and rhythm, S1, S2 normal, no murmur, click, rub or gallop Extremities: extremities normal, atraumatic, no cyanosis or edema  Lab Results: Results for orders placed during the hospital encounter of 08/26/12 (from the past 48 hour(s))  CBC WITH DIFFERENTIAL     Status: Abnormal   Collection Time    08/26/12  4:39 PM      Result Value Range   WBC 5.4  4.0 - 10.5 K/uL   RBC 3.50 (*) 3.87 - 5.11 MIL/uL   Hemoglobin 10.7 (*) 12.0 - 15.0 g/dL   HCT 45.4 (*) 09.8 - 11.9 %   MCV 88.3  78.0 - 100.0 fL   MCH 30.6  26.0 - 34.0 pg   MCHC 34.6  30.0 - 36.0 g/dL   RDW 14.7  82.9 - 56.2 %   Platelets 162  150 - 400 K/uL   Neutrophils Relative % 77  43 - 77 %   Neutro Abs 4.2  1.7 - 7.7 K/uL   Lymphocytes Relative 15  12 - 46 %   Lymphs Abs 0.8  0.7 - 4.0 K/uL   Monocytes Relative 8  3 - 12 %   Monocytes Absolute 0.4  0.1 - 1.0 K/uL   Eosinophils Relative 0  0 - 5 %   Eosinophils Absolute 0.0  0.0 - 0.7 K/uL   Basophils Relative 0  0 - 1 %   Basophils Absolute 0.0  0.0 - 0.1 K/uL  COMPREHENSIVE METABOLIC PANEL     Status: Abnormal   Collection Time    08/26/12  4:39 PM      Result  Value Range   Sodium 128 (*) 135 - 145 mEq/L   Potassium 3.3 (*) 3.5 - 5.1 mEq/L   Comment: DELTA CHECK NOTED   Chloride 95 (*) 96 - 112 mEq/L   CO2 19  19 - 32 mEq/L   Glucose, Bld 123 (*) 70 - 99 mg/dL   BUN 8  6 - 23 mg/dL   Creatinine, Ser 1.30 (*) 0.50 - 1.10 mg/dL   Calcium 8.8  8.4 - 86.5 mg/dL   Total Protein 6.5  6.0 - 8.3 g/dL   Albumin 3.5  3.5 - 5.2 g/dL   AST 27  0 - 37 U/L   ALT 19  0 - 35 U/L   Alkaline Phosphatase 115  39 - 117 U/L   Total Bilirubin 0.4  0.3 - 1.2 mg/dL   GFR calc non Af Amer >90  >90 mL/min   GFR calc Af Amer >90  >90 mL/min   Comment:            The eGFR has been calculated     using the CKD EPI equation.  This calculation has not been     validated in all clinical     situations.     eGFR's persistently     <90 mL/min signify     possible Chronic Kidney Disease.  URINALYSIS, ROUTINE W REFLEX MICROSCOPIC     Status: Abnormal   Collection Time    08/26/12  5:29 PM      Result Value Range   Color, Urine YELLOW  YELLOW   APPearance CLEAR  CLEAR   Specific Gravity, Urine 1.007  1.005 - 1.030   pH 7.0  5.0 - 8.0   Glucose, UA NEGATIVE  NEGATIVE mg/dL   Hgb urine dipstick NEGATIVE  NEGATIVE   Bilirubin Urine NEGATIVE  NEGATIVE   Ketones, ur 40 (*) NEGATIVE mg/dL   Protein, ur NEGATIVE  NEGATIVE mg/dL   Urobilinogen, UA 0.2  0.0 - 1.0 mg/dL   Nitrite NEGATIVE  NEGATIVE   Leukocytes, UA NEGATIVE  NEGATIVE   Comment: MICROSCOPIC NOT DONE ON URINES WITH NEGATIVE PROTEIN, BLOOD, LEUKOCYTES, NITRITE, OR GLUCOSE <1000 mg/dL.  MAGNESIUM     Status: None   Collection Time    08/26/12  7:10 PM      Result Value Range   Magnesium 1.9  1.5 - 2.5 mg/dL  TROPONIN I     Status: None   Collection Time    08/26/12  7:12 PM      Result Value Range   Troponin I <0.30  <0.30 ng/mL   Comment:            Due to the release kinetics of cTnI,     a negative result within the first hours     of the onset of symptoms does not rule out     myocardial  infarction with certainty.     If myocardial infarction is still suspected,     repeat the test at appropriate intervals.  MRSA PCR SCREENING     Status: None   Collection Time    08/26/12 11:39 PM      Result Value Range   MRSA by PCR NEGATIVE  NEGATIVE   Comment:            The GeneXpert MRSA Assay (FDA     approved for NASAL specimens     only), is one component of a     comprehensive MRSA colonization     surveillance program. It is not     intended to diagnose MRSA     infection nor to guide or     monitor treatment for     MRSA infections.  COMPREHENSIVE METABOLIC PANEL     Status: Abnormal   Collection Time    08/27/12 12:00 AM      Result Value Range   Sodium 128 (*) 135 - 145 mEq/L   Potassium 4.0  3.5 - 5.1 mEq/L   Comment: SLIGHT HEMOLYSIS   Chloride 97  96 - 112 mEq/L   CO2 19  19 - 32 mEq/L   Glucose, Bld 113 (*) 70 - 99 mg/dL   BUN 7  6 - 23 mg/dL   Creatinine, Ser 7.82 (*) 0.50 - 1.10 mg/dL   Calcium 9.4  8.4 - 95.6 mg/dL   Total Protein 7.3  6.0 - 8.3 g/dL   Albumin 3.7  3.5 - 5.2 g/dL   AST 33  0 - 37 U/L   ALT 21  0 - 35 U/L   Alkaline Phosphatase 124 (*) 39 - 117 U/L  Total Bilirubin 0.4  0.3 - 1.2 mg/dL   GFR calc non Af Amer 90 (*) >90 mL/min   GFR calc Af Amer >90  >90 mL/min   Comment:            The eGFR has been calculated     using the CKD EPI equation.     This calculation has not been     validated in all clinical     situations.     eGFR's persistently     <90 mL/min signify     possible Chronic Kidney Disease.  TROPONIN I     Status: None   Collection Time    08/27/12 12:00 AM      Result Value Range   Troponin I <0.30  <0.30 ng/mL   Comment:            Due to the release kinetics of cTnI,     a negative result within the first hours     of the onset of symptoms does not rule out     myocardial infarction with certainty.     If myocardial infarction is still suspected,     repeat the test at appropriate intervals.  TROPONIN I      Status: None   Collection Time    08/27/12  8:19 AM      Result Value Range   Troponin I <0.30  <0.30 ng/mL   Comment:            Due to the release kinetics of cTnI,     a negative result within the first hours     of the onset of symptoms does not rule out     myocardial infarction with certainty.     If myocardial infarction is still suspected,     repeat the test at appropriate intervals.    Imaging: Imaging results have been reviewed  Assessment/Plan:   1. Active Problems: 2.   * No active hospital problems. * 3.   Time Spent Directly with Patient:  20 minutes  Length of Stay:  LOS: 1 day   S/P TTVPM insertion for symptomatic long pauses. Exam benign. No pacing last night. Will check 2D echo. Plan PTVPM by Dr. Royann Shivers tomorrow,  Runell Gess 08/27/2012, 9:43 AM

## 2012-08-27 NOTE — Progress Notes (Signed)
  Echocardiogram 2D Echocardiogram has been performed.  Georgian Co 08/27/2012, 11:43 AM

## 2012-08-28 ENCOUNTER — Encounter (HOSPITAL_COMMUNITY)
Admission: EM | Disposition: A | Discharge status: Home / Self Care | Payer: Self-pay | Source: Home / Self Care | Attending: Cardiovascular Disease

## 2012-08-28 DIAGNOSIS — I455 Other specified heart block: Secondary | ICD-10-CM

## 2012-08-28 HISTORY — PX: PERMANENT PACEMAKER INSERTION: SHX5480

## 2012-08-28 HISTORY — PX: PACEMAKER INSERTION: SHX728

## 2012-08-28 HISTORY — DX: Other specified heart block: I45.5

## 2012-08-28 LAB — PROTIME-INR
INR: 1.12 (ref 0.00–1.49)
Prothrombin Time: 14.2 seconds (ref 11.6–15.2)

## 2012-08-28 LAB — CBC
HCT: 31.2 % — ABNORMAL LOW (ref 36.0–46.0)
Hemoglobin: 10.8 g/dL — ABNORMAL LOW (ref 12.0–15.0)
MCH: 30.4 pg (ref 26.0–34.0)
MCHC: 34.6 g/dL (ref 30.0–36.0)
MCV: 87.9 fL (ref 78.0–100.0)
Platelets: 172 10*3/uL (ref 150–400)
RBC: 3.55 MIL/uL — ABNORMAL LOW (ref 3.87–5.11)
RDW: 13.3 % (ref 11.5–15.5)
WBC: 5.9 10*3/uL (ref 4.0–10.5)

## 2012-08-28 LAB — BASIC METABOLIC PANEL
BUN: 10 mg/dL (ref 6–23)
CO2: 26 mEq/L (ref 19–32)
Calcium: 9.5 mg/dL (ref 8.4–10.5)
Chloride: 102 mEq/L (ref 96–112)
Creatinine, Ser: 0.59 mg/dL (ref 0.50–1.10)
GFR calc Af Amer: >90 mL/min (ref >90)
GFR calc non Af Amer: 84 mL/min — ABNORMAL LOW (ref >90)
Glucose, Bld: 103 mg/dL — ABNORMAL HIGH (ref 70–99)
Potassium: 3.6 mEq/L (ref 3.5–5.1)
Sodium: 135 mEq/L (ref 135–145)

## 2012-08-28 SURGERY — PERMANENT PACEMAKER INSERTION
Anesthesia: LOCAL

## 2012-08-28 MED ORDER — HEPARIN (PORCINE) IN NACL 2-0.9 UNIT/ML-% IJ SOLN
INTRAMUSCULAR | Status: AC
  Filled 2012-08-28: qty 1000

## 2012-08-28 MED ORDER — ONDANSETRON HCL 4 MG/2ML IJ SOLN
INTRAMUSCULAR | Status: AC
  Filled 2012-08-28: qty 2

## 2012-08-28 MED ORDER — NITROGLYCERIN 0.2 MG/ML ON CALL CATH LAB
INTRAVENOUS | Status: AC
  Filled 2012-08-28: qty 1

## 2012-08-28 MED ORDER — MIDAZOLAM HCL 5 MG/5ML IJ SOLN
INTRAMUSCULAR | Status: AC
  Filled 2012-08-28: qty 5

## 2012-08-28 MED ORDER — CEFAZOLIN SODIUM 1-5 GM-% IV SOLN
1.0000 g | Freq: Four times a day (QID) | INTRAVENOUS | Status: AC
  Administered 2012-08-28 – 2012-08-29 (×3): 1 g via INTRAVENOUS
  Filled 2012-08-28 (×3): qty 50

## 2012-08-28 MED ORDER — HYDRALAZINE HCL 20 MG/ML IJ SOLN
10.0000 mg | Freq: Three times a day (TID) | INTRAMUSCULAR | Status: DC | PRN
  Administered 2012-08-28 – 2012-08-29 (×2): 10 mg via INTRAVENOUS
  Filled 2012-08-28 (×2): qty 1

## 2012-08-28 MED ORDER — SODIUM CHLORIDE 0.9 % IV SOLN
INTRAVENOUS | Status: AC
  Administered 2012-08-28: 12:00:00 via INTRAVENOUS

## 2012-08-28 MED ORDER — LIDOCAINE HCL (PF) 1 % IJ SOLN
INTRAMUSCULAR | Status: AC
  Filled 2012-08-28: qty 60

## 2012-08-28 MED ORDER — CHLORHEXIDINE GLUCONATE 4 % EX LIQD
Freq: Once | CUTANEOUS | Status: AC
  Administered 2012-08-28: 06:00:00 via TOPICAL
  Filled 2012-08-28: qty 15

## 2012-08-28 MED ORDER — LIDOCAINE HCL (PF) 1 % IJ SOLN
INTRAMUSCULAR | Status: AC
  Filled 2012-08-28: qty 30

## 2012-08-28 NOTE — Op Note (Signed)
Procedure report  Procedure performed:  1. Implantation of new dual chamber permanent pacemaker 2. Fluoroscopy 3. Light sedation 4. Removal of right femoral transvenous temporary pacemaker  Reason for procedure: Recurrent syncope due to: Sinus arrest  Procedure performed by: Thurmon Fair, MD  Complications: None  Estimated blood loss: <10 mL  Medications administered during procedure: Ancef 3 g intravenously Lidocaine 1% 30 mL locally,  Versed 3 mg intravenously  Device details: Generator Medtronic Adapta [id number], serial number [id number] H Right atrial lead Medtronic [id number] serial number [id number] Right ventricular lead Medtronic [id number] serial number [id number]  Procedure details:  After the risks and benefits of the procedure were discussed the patient provided informed consent and was brought to the cardiac cath lab in the fasting state. The patient was prepped and draped in usual sterile fashion. Local anesthesia with 1% lidocaine was administered to to the left infraclavicular area. A 5-6 cm horizontal incision was made parallel with and 2-3 cm caudal to the left clavicle. Using electrocautery and blunt dissection a prepectoral pocket was created down to the level of the pectoralis major muscle fascia. The pocket was carefully inspected for hemostasis. An antibiotic-soaked sponge was placed in the pocket.  Under fluoroscopic guidance and using the modified Seldinger technique 2 separate venipunctures were performed to access the left subclavian vein. No difficulty was encountered accessing the vein.  Two J-tip guidewires were subsequently exchanged for two 7 French safe sheaths.  Under fluoroscopic guidance the ventricular lead was advanced to level of the mid to apical right ventricular septum and thet active-fixation helix was deployed. Prominent current of injury was seen. Satisfactory pacing and sensing parameters were recorded. There was no evidence of diaphragmatic  stimulation at maximum device output. The safe sheath was peeled away and the lead was secured in place with 2-0 silk.  In similar fashion the right atrial lead was advanced to the level of the atrial appendage. The active-fixation helix was deployed. There was prominent current of injury. Satisfactory  pacing and sensing parameters were recorded. There was no evidence of diaphragmatic stimulation with pacing at maximum device output. The safe sheath was peeled away and the lead was secured in place with 2-0 silk.  The antibiotic-soaked sponge was removed from the pocket. The pocket was flushed with copious amounts of antibiotic solution. Reinspection showed excellent hemostasis..  The ventricular lead was connected to the generator and appropriate ventricular pacing was seen. Subsequently the atrial lead was also connected. Repeat testing of the lead parameters later showed excellent values.  The entire system was then carefully inserted in the pocket with care been taking that the leads and device assumed a comfortable position without pressure on the incision. Great care was taken that the leads be located deep to the generator. The pocket was then closed in layers using 2 layers of 2-0 Vicryl and cutaneous staples, after which a sterile dressing was applied.  At the end of the procedure the following lead parameters were encountered:  Right atrial lead  sensed P waves 3.5 mV, impedance 800 ohms, threshold 0.9 V at 0.5 ms pulse width.  Right ventricular lead sensed R waves 7.2 mV, impedance 915ohms, threshold 0.7 V at 0.5 ms pulse width.  Thurmon Fair, MD, [facility] [phone] office [phone] pager

## 2012-08-28 NOTE — Progress Notes (Signed)
Utilization review completed.  

## 2012-08-28 NOTE — Progress Notes (Signed)
[facility]  DAILY PROGRESS NOTE   Subjective:  Feels well. In NSR now. No complaints except inability to sleep due to SCDs inflating.  Objective:  Temp:  [97.4 F (36.3 C)-98.8 F (37.1 C)] 98 F (36.7 C) (08/04 0749) Pulse Rate:  [63-84] 67 (08/04 0800) Resp:  [14-28] 17 (08/04 0800) BP: (92-206)/(46-80) 201/70 mmHg (08/04 0623) SpO2:  [95 %-100 %] 97 % (08/04 0800) Weight change:   Intake/Output from previous day: 08/03 0701 - 08/04 0700 In: 1111.7 [P.O.:600; I.V.:511.7] Out: 2700 [Urine:2700]  Intake/Output from this shift:    Medications: Current Facility-Administered Medications  Medication Dose Route Frequency Provider Last Rate Last Dose  . 0.9 %  sodium chloride infusion  250 mL Intravenous PRN Quintella Reichert, MD 20 mL/hr at 08/27/12 0000 250 mL at 08/27/12 0000  . 0.9 %  sodium chloride infusion   Intravenous Continuous Abelino Derrick, PA-C 50 mL/hr at 08/28/12 2130    . acetaminophen (TYLENOL) tablet 650 mg  650 mg Oral Q4H PRN Runell Gess, MD      . ceFAZolin (ANCEF) 3 g in dextrose 5 % 50 mL IVPB  3 g Intravenous On Call Luke K Kilroy, PA-C      . cholecalciferol (VITAMIN D) tablet 2,000 Units  2,000 Units Oral Q breakfast Quintella Reichert, MD   2,000 Units at 08/27/12 0954  . diazepam (VALIUM) tablet 5 mg  5 mg Oral On Call Luke K Kilroy, PA-C      . gentamicin (GARAMYCIN) 80 mg in sodium chloride irrigation 0.9 % 500 mL irrigation  80 mg Irrigation On Call [employer], PA-C      . ondansetron [facility]) injection 4 mg  4 mg Intravenous Q6H PRN Runell Gess, MD      . sodium chloride 0.9 % injection 3 mL  3 mL Intravenous Q12H Traci R Turner, MD      . sodium chloride 0.9 % injection 3 mL  3 mL Intravenous Q12H Quintella Reichert, MD   3 mL at 08/27/12 2200  . sodium chloride 0.9 % injection 3 mL  3 mL Intravenous PRN Quintella Reichert, MD        Physical Exam: General appearance: alert, cooperative and no distress Neck: no  adenopathy, no carotid bruit, no JVD, supple, symmetrical, trachea midline and thyroid not enlarged, symmetric, no tenderness/mass/nodules Lungs: clear to auscultation bilaterally Heart: regular rate and rhythm, S1, S2 normal, no murmur, click, rub or gallop Abdomen: soft, non-tender; bowel sounds normal; no masses,  no organomegaly Extremities: extremities normal, atraumatic, no cyanosis or edema Pulses: 2+ and symmetric Skin: Skin color, texture, turgor normal. No rashes or lesions Neurologic: Grossly normal TEMP TRANSVENOUS RIGHT FEMORAL PM WIRE  Lab Results: Results for orders placed during the hospital encounter of 08/26/12 (from the past 48 hour(s))  CBC WITH DIFFERENTIAL     Status: Abnormal   Collection Time    08/26/12  4:39 PM      Result Value Range   WBC 5.4  4.0 - 10.5 K/uL   RBC 3.50 (*) 3.87 - 5.11 MIL/uL   Hemoglobin 10.7 (*) 12.0 - 15.0 g/dL   HCT 86.5 (*) 78.4 - 69.6 %   MCV 88.3  78.0 - 100.0 fL   MCH 30.6  26.0 - 34.0 pg   MCHC 34.6  30.0 - 36.0 g/dL   RDW 29.5  28.4 - 13.2 %   Platelets 162  150 - 400  K/uL   Neutrophils Relative % 77  43 - 77 %   Neutro Abs 4.2  1.7 - 7.7 K/uL   Lymphocytes Relative 15  12 - 46 %   Lymphs Abs 0.8  0.7 - 4.0 K/uL   Monocytes Relative 8  3 - 12 %   Monocytes Absolute 0.4  0.1 - 1.0 K/uL   Eosinophils Relative 0  0 - 5 %   Eosinophils Absolute 0.0  0.0 - 0.7 K/uL   Basophils Relative 0  0 - 1 %   Basophils Absolute 0.0  0.0 - 0.1 K/uL  COMPREHENSIVE METABOLIC PANEL     Status: Abnormal   Collection Time    08/26/12  4:39 PM      Result Value Range   Sodium 128 (*) 135 - 145 mEq/L   Potassium 3.3 (*) 3.5 - 5.1 mEq/L   Comment: DELTA CHECK NOTED   Chloride 95 (*) 96 - 112 mEq/L   CO2 19  19 - 32 mEq/L   Glucose, Bld 123 (*) 70 - 99 mg/dL   BUN 8  6 - 23 mg/dL   Creatinine, Ser 1.61 (*) 0.50 - 1.10 mg/dL   Calcium 8.8  8.4 - 09.6 mg/dL   Total Protein 6.5  6.0 - 8.3 g/dL   Albumin 3.5  3.5 - 5.2 g/dL   AST 27  0 - 37  U/L   ALT 19  0 - 35 U/L   Alkaline Phosphatase 115  39 - 117 U/L   Total Bilirubin 0.4  0.3 - 1.2 mg/dL   GFR calc non Af Amer >90  >90 mL/min   GFR calc Af Amer >90  >90 mL/min   Comment:            The eGFR has been calculated     using the CKD EPI equation.     This calculation has not been     validated in all clinical     situations.     eGFR's persistently     <90 mL/min signify     possible Chronic Kidney Disease.  URINALYSIS, ROUTINE W REFLEX MICROSCOPIC     Status: Abnormal   Collection Time    08/26/12  5:29 PM      Result Value Range   Color, Urine YELLOW  YELLOW   APPearance CLEAR  CLEAR   Specific Gravity, Urine 1.007  1.005 - 1.030   pH 7.0  5.0 - 8.0   Glucose, UA NEGATIVE  NEGATIVE mg/dL   Hgb urine dipstick NEGATIVE  NEGATIVE   Bilirubin Urine NEGATIVE  NEGATIVE   Ketones, ur 40 (*) NEGATIVE mg/dL   Protein, ur NEGATIVE  NEGATIVE mg/dL   Urobilinogen, UA 0.2  0.0 - 1.0 mg/dL   Nitrite NEGATIVE  NEGATIVE   Leukocytes, UA NEGATIVE  NEGATIVE   Comment: MICROSCOPIC NOT DONE ON URINES WITH NEGATIVE PROTEIN, BLOOD, LEUKOCYTES, NITRITE, OR GLUCOSE <1000 mg/dL.  MAGNESIUM     Status: None   Collection Time    08/26/12  7:10 PM      Result Value Range   Magnesium 1.9  1.5 - 2.5 mg/dL  TROPONIN I     Status: None   Collection Time    08/26/12  7:12 PM      Result Value Range   Troponin I <0.30  <0.30 ng/mL   Comment:            Due to the release kinetics of cTnI,  a negative result within the first hours     of the onset of symptoms does not rule out     myocardial infarction with certainty.     If myocardial infarction is still suspected,     repeat the test at appropriate intervals.  MRSA PCR SCREENING     Status: None   Collection Time    08/26/12 11:39 PM      Result Value Range   MRSA by PCR NEGATIVE  NEGATIVE   Comment:            The GeneXpert MRSA Assay (FDA     approved for NASAL specimens     only), is one component of a      comprehensive MRSA colonization     surveillance program. It is not     intended to diagnose MRSA     infection nor to guide or     monitor treatment for     MRSA infections.  COMPREHENSIVE METABOLIC PANEL     Status: Abnormal   Collection Time    08/27/12 12:00 AM      Result Value Range   Sodium 128 (*) 135 - 145 mEq/L   Potassium 4.0  3.5 - 5.1 mEq/L   Comment: SLIGHT HEMOLYSIS   Chloride 97  96 - 112 mEq/L   CO2 19  19 - 32 mEq/L   Glucose, Bld 113 (*) 70 - 99 mg/dL   BUN 7  6 - 23 mg/dL   Creatinine, Ser 1.61 (*) 0.50 - 1.10 mg/dL   Calcium 9.4  8.4 - 09.6 mg/dL   Total Protein 7.3  6.0 - 8.3 g/dL   Albumin 3.7  3.5 - 5.2 g/dL   AST 33  0 - 37 U/L   ALT 21  0 - 35 U/L   Alkaline Phosphatase 124 (*) 39 - 117 U/L   Total Bilirubin 0.4  0.3 - 1.2 mg/dL   GFR calc non Af Amer 90 (*) >90 mL/min   GFR calc Af Amer >90  >90 mL/min   Comment:            The eGFR has been calculated     using the CKD EPI equation.     This calculation has not been     validated in all clinical     situations.     eGFR's persistently     <90 mL/min signify     possible Chronic Kidney Disease.  TSH     Status: None   Collection Time    08/27/12 12:00 AM      Result Value Range   TSH 0.993  0.350 - 4.500 uIU/mL  TROPONIN I     Status: None   Collection Time    08/27/12 12:00 AM      Result Value Range   Troponin I <0.30  <0.30 ng/mL   Comment:            Due to the release kinetics of cTnI,     a negative result within the first hours     of the onset of symptoms does not rule out     myocardial infarction with certainty.     If myocardial infarction is still suspected,     repeat the test at appropriate intervals.  TROPONIN I     Status: None   Collection Time    08/27/12  8:19 AM      Result Value Range   Troponin I <0.30  <  0.30 ng/mL   Comment:            Due to the release kinetics of cTnI,     a negative result within the first hours     of the onset of symptoms does not  rule out     myocardial infarction with certainty.     If myocardial infarction is still suspected,     repeat the test at appropriate intervals.  TROPONIN I     Status: None   Collection Time    08/27/12 12:12 PM      Result Value Range   Troponin I <0.30  <0.30 ng/mL   Comment:            Due to the release kinetics of cTnI,     a negative result within the first hours     of the onset of symptoms does not rule out     myocardial infarction with certainty.     If myocardial infarction is still suspected,     repeat the test at appropriate intervals.  CBC     Status: Abnormal   Collection Time    08/28/12  4:00 AM      Result Value Range   WBC 5.9  4.0 - 10.5 K/uL   RBC 3.55 (*) 3.87 - 5.11 MIL/uL   Hemoglobin 10.8 (*) 12.0 - 15.0 g/dL   HCT 16.1 (*) 09.6 - 04.5 %   MCV 87.9  78.0 - 100.0 fL   MCH 30.4  26.0 - 34.0 pg   MCHC 34.6  30.0 - 36.0 g/dL   RDW 40.9  81.1 - 91.4 %   Platelets 172  150 - 400 K/uL  BASIC METABOLIC PANEL     Status: Abnormal   Collection Time    08/28/12  4:00 AM      Result Value Range   Sodium 135  135 - 145 mEq/L   Comment: DELTA CHECK NOTED   Potassium 3.6  3.5 - 5.1 mEq/L   Chloride 102  96 - 112 mEq/L   CO2 26  19 - 32 mEq/L   Glucose, Bld 103 (*) 70 - 99 mg/dL   BUN 10  6 - 23 mg/dL   Creatinine, Ser 7.82  0.50 - 1.10 mg/dL   Calcium 9.5  8.4 - 95.6 mg/dL   GFR calc non Af Amer 84 (*) >90 mL/min   GFR calc Af Amer >90  >90 mL/min   Comment:            The eGFR has been calculated     using the CKD EPI equation.     This calculation has not been     validated in all clinical     situations.     eGFR's persistently     <90 mL/min signify     possible Chronic Kidney Disease.  PROTIME-INR     Status: None   Collection Time    08/28/12  4:00 AM      Result Value Range   Prothrombin Time 14.2  11.6 - 15.2 seconds   INR 1.12  0.00 - 1.49    Imaging: Ct Head Wo Contrast  08/26/2012   *RADIOLOGY REPORT*  Clinical Data: 79 year old  female with near syncope and dizziness.  CT HEAD WITHOUT CONTRAST  Technique:  Contiguous axial images were obtained from the base of the skull through the vertex without contrast.  Comparison: 08/24/2012 head CT  Findings: Mild generalized cerebral volume loss and  mild chronic small vessel white matter ischemic changes are again noted.  No acute intracranial abnormalities are identified, including mass lesion or mass effect, hydrocephalus, extra-axial fluid collection, midline shift, hemorrhage, or acute infarction.  The visualized bony calvarium is unremarkable.  IMPRESSION: No evidence of acute intracranial abnormality.  Mild atrophy and chronic small vessel white matter ischemic changes.   Original Report Authenticated By: Harmon Pier, M.D.   Ct Abdomen Pelvis W Contrast  08/26/2012   *RADIOLOGY REPORT*  Clinical Data: 79 year old female with abdominal and pelvic pain and nausea.  CT ABDOMEN AND PELVIS WITH CONTRAST  Technique:  Multidetector CT imaging of the abdomen and pelvis was performed following the standard protocol during bolus administration of intravenous contrast.  Contrast:  100 ml intravenous Omnipaque-300  Comparison: 07/02/2010 CT  Findings: The lung bases are clear.  The liver, spleen, adrenal glands, pancreas, and kidneys are unremarkable. The patient is status post cholecystectomy. No free fluid, enlarged lymph nodes, biliary dilation or abdominal aortic aneurysm identified. The bowel and bladder are within normal limits except for a few scattered colonic diverticula. There is no evidence of bowel obstruction or pneumoperitoneum. The uterus and adnexal regions are within normal limits except for a few calcified fibroids.  No acute or suspicious bony abnormalities are identified.  Severe degenerative changes within the right hip noted.  IMPRESSION: No evidence of acute or significant abnormality within the abdomen/pelvis.   Original Report Authenticated By: Harmon Pier, M.D.     Assessment:  Active Problems:   Sinus arrest   Plan:  1. DUAL CHAMBER PPM SCHEDULED FOR TODAY This procedure has been fully reviewed with the patient and written informed consent has been obtained. Also discussed with her husband by phone.   Time Spent Directly with Patient:  45 minutes  Length of Stay:  LOS: 2 days    Andreyah Natividad 08/28/2012, 8:19 AM

## 2012-08-28 NOTE — Progress Notes (Signed)
Patient transported to Cath Lab per Dr. Salena Saner. with cardiology. No acute distress noted.  Escorted with Nida Boatman RN from SICU.  Safety maintained.

## 2012-08-29 ENCOUNTER — Inpatient Hospital Stay (HOSPITAL_COMMUNITY): Payer: Medicare Other

## 2012-08-29 MED ORDER — DOCUSATE SODIUM 100 MG PO CAPS
200.0000 mg | ORAL_CAPSULE | Freq: Once | ORAL | Status: AC
  Administered 2012-08-29: 200 mg via ORAL
  Filled 2012-08-29: qty 2

## 2012-08-29 MED ORDER — POLYETHYLENE GLYCOL 3350 17 G PO PACK
17.0000 g | PACK | Freq: Once | ORAL | Status: AC
  Administered 2012-08-29: 17 g via ORAL
  Filled 2012-08-29: qty 1

## 2012-08-29 MED ORDER — DOCUSATE SODIUM 100 MG PO CAPS
200.0000 mg | ORAL_CAPSULE | Freq: Every day | ORAL | Status: DC
  Administered 2012-08-29: 200 mg via ORAL
  Filled 2012-08-29 (×2): qty 2

## 2012-08-29 MED ORDER — YOU HAVE A PACEMAKER BOOK
Freq: Once | Status: AC
  Administered 2012-08-29: 07:00:00
  Filled 2012-08-29: qty 1

## 2012-08-29 NOTE — Progress Notes (Signed)
Chaplain responded to a code stemi page and reported to cath lab. Pt was brought into Cath lab 3 and family at the waiting area. Chaplain provided ministry of presence, empathic listening, served as Print production planner between [employer] and family member, and provided ministry of presence. Pt was admitted at 2307 after procedure. Family thanked chaplain for his presence and support. Kelle Darting [phone]

## 2012-08-29 NOTE — Progress Notes (Signed)
    Subjective: Feels weak.  Objective: Vital signs in last 24 hours: Temp:  [98 F (36.7 C)-98.4 F (36.9 C)] 98.4 F (36.9 C) (08/05 0546) Pulse Rate:  [71-84] 84 (08/05 0730) Resp:  [18] 18 (08/05 0546) BP: (124-221)/(48-81) 124/48 mmHg (08/05 0730) SpO2:  [97 %-100 %] 97 % (08/05 0546) Weight:  [131 lb 11.2 oz (59.739 kg)] 131 lb 11.2 oz (59.739 kg) (08/04 1118) Last BM Date: 08/26/12  Intake/Output from previous day: 08/04 0701 - 08/05 0700 In: 340 [P.O.:240; IV Piggyback:100] Out: -  Intake/Output this shift:    Medications Current Facility-Administered Medications  Medication Dose Route Frequency Provider Last Rate Last Dose  . 0.9 %  sodium chloride infusion  250 mL Intravenous PRN Quintella Reichert, MD 20 mL/hr at 08/27/12 0000 250 mL at 08/27/12 0000  . acetaminophen (TYLENOL) tablet 650 mg  650 mg Oral Q4H PRN Runell Gess, MD   650 mg at 08/28/12 2339  . cholecalciferol (VITAMIN D) tablet 2,000 Units  2,000 Units Oral Q breakfast Quintella Reichert, MD   2,000 Units at 08/27/12 0954  . hydrALAZINE (APRESOLINE) injection 10 mg  10 mg Intravenous Q8H PRN Wilburt Finlay, PA-C   10 mg at 08/29/12 0645  . ondansetron (ZOFRAN) injection 4 mg  4 mg Intravenous Q6H PRN Runell Gess, MD      . sodium chloride 0.9 % injection 3 mL  3 mL Intravenous Q12H Quintella Reichert, MD   3 mL at 08/28/12 0821  . sodium chloride 0.9 % injection 3 mL  3 mL Intravenous Q12H Quintella Reichert, MD   3 mL at 08/28/12 2057  . sodium chloride 0.9 % injection 3 mL  3 mL Intravenous PRN Quintella Reichert, MD        PE: General appearance: alert, cooperative, flushed and mild distress Lungs: clear to auscultation bilaterally Heart: regular rate and rhythm, S1, S2 normal, no murmur, click, rub or gallop Extremities: No LEE Pulses: 2+ and symmetric Skin: Minimal drainage at pacer site.  No edema. Neurologic: Grossly normal  Lab Results:   Recent Labs  08/26/12 1639 08/28/12 0400  WBC 5.4 5.9    HGB 10.7* 10.8*  HCT 30.9* 31.2*  PLT 162 172   BMET  Recent Labs  08/26/12 1639 08/27/12 08/28/12 0400  NA 128* 128* 135  K 3.3* 4.0 3.6  CL 95* 97 102  CO2 19 19 26   GLUCOSE 123* 113* 103*  BUN 8 7 10   CREATININE 0.47* 0.48* 0.59  CALCIUM 8.8 9.4 9.5   PT/INR  Recent Labs  08/28/12 0400  LABPROT 14.2  INR 1.12     Assessment/Plan  Active Problems:   Sinus arrest  Plan:  SP PPM placement.  PRN hydralazine for elevated BP which is better now.  No pneumothorax on CXR.  Will keep one more night.    LOS: 3 days    HAGER, BRYAN 08/29/2012 8:24 AM   Agree with note written by Jones Skene PAC  POD # 1 dual chamber PTVPM for CHB and syncope. CXR no PTX. Labs and exam benign. Pt not feeling well today w/o specific complaints. Follow one more day. OOB to chair, Ambulate with assistance. [facility]/PT consult   Runell Gess 08/29/2012 8:42 AM

## 2012-08-30 ENCOUNTER — Encounter (HOSPITAL_COMMUNITY): Payer: Self-pay | Admitting: Cardiology

## 2012-08-30 DIAGNOSIS — Z950 Presence of cardiac pacemaker: Secondary | ICD-10-CM | POA: Diagnosis not present

## 2012-08-30 DIAGNOSIS — R55 Syncope and collapse: Secondary | ICD-10-CM | POA: Diagnosis present

## 2012-08-30 DIAGNOSIS — IMO0001 Reserved for inherently not codable concepts without codable children: Secondary | ICD-10-CM | POA: Diagnosis not present

## 2012-08-30 DIAGNOSIS — C50919 Malignant neoplasm of unspecified site of unspecified female breast: Secondary | ICD-10-CM | POA: Diagnosis present

## 2012-08-30 MED ORDER — ACETAMINOPHEN 325 MG PO TABS: 650.0000 mg | ORAL_TABLET | ORAL | Status: DC | PRN

## 2012-08-30 NOTE — Discharge Summary (Signed)
Patient ID: Tamara [name],  MRN: [id number], DOB/AGE: 1929/11/08 79 y.o.  Admit date: 08/26/2012 Discharge date: 08/30/2012  Primary Care Provider: Dr Jerelyn Scott Primary Cardiologist: Dr Royann Shivers  Discharge Diagnoses Principal Problem:   Syncope Active Problems:   Sinus arrest   Pacemaker implanted 08/28/12   Cancer of breast-    Elevated blood pressure    Procedures: Implantation of MDT pacemaker 08/28/12   Hospital Course 79 y/o female with a past medical history remarkable for treated breast cancer, but no prior history of CAD, DM, or HTN. She presented to the ER with weakness and near syncope and then had frank syncope in the triage. She had documented sinus pauses. She was admitted to ICU and had a T-TVDP placed by Dr Allyson Sabal. Echo showed Nl LVF. On 08/28/12 she underwent elective pacemaker implant by Dr Royann Shivers with a MDT device. She tolerated this well. CXR showed no pneumothorax post op. On POD #1 she said she was weak and not up to going home. On the morning of the 6 th she is feeling better and Dr Allyson Sabal feels she can be discharged. Her B/P has been a little labile, this will be followed up as an OP. She'll return in a week for a site check and in 2 months for a pacer check.  Discharge Vitals:  Blood pressure 153/62, pulse 88, temperature 98.3 F (36.8 C), temperature source Oral, resp. rate 16, height 5\' 6"  (1.676 m), weight 131 lb 11.2 oz (59.739 kg), SpO2 99.00%.    Labs: No results found for this or any previous visit (from the past 48 hour(s)).  Disposition:  Follow-up Information   Follow up with Abelino Derrick, PA-C On 09/06/2012. (2:40)    Contact information:   [address] [place] [address] [phone]       Discharge Medications:    Medication List    ASK your doctor about these medications       ALIGN PO  Take 1 capsule by mouth daily with breakfast.     antioxidant formula SG capsule  Take 1 capsule by mouth daily.     GLUCOSAMINE-CHONDROITIN PO  Take 1 capsule by mouth daily with breakfast.     multivitamin with minerals Tabs tablet  Take 1 tablet by mouth daily with breakfast.     OMEGA 3 PO  Take 1 capsule by mouth daily with breakfast.     SUPER B COMPLEX PO  Take by mouth every other day.     VITAMIN B-12 PO  Take by mouth every other day.     Vitamin D 2000 UNITS tablet  Take 2,000 Units by mouth daily with breakfast.         Duration of Discharge Encounter: Greater than 30 minutes including physician time.  Jolene Provost PA-C 08/30/2012 10:14 AM

## 2012-08-30 NOTE — Progress Notes (Signed)
Subjective:  No complaints this am, ready to go home  Objective:  Vital Signs in the last 24 hours: Temp:  [97.8 F (36.6 C)-98.6 F (37 C)] 98.3 F (36.8 C) (08/06 1610) Pulse Rate:  [83-88] 88 (08/06 0613) Resp:  [16] 16 (08/06 0613) BP: (110-153)/(53-64) 153/62 mmHg (08/06 0613) SpO2:  [99 %-100 %] 99 % (08/06 0613)  Intake/Output from previous day:  Intake/Output Summary (Last 24 hours) at 08/30/12 1007 Last data filed at 08/29/12 2002  Gross per 24 hour  Intake    363 ml  Output      0 ml  Net    363 ml    Physical Exam: General appearance: alert, cooperative and no distress Lungs: clear to auscultation bilaterally Heart: regular rate and rhythm Pacer site dressing removed, ecchymosis no hematoma   Rate: 88  Rhythm: normal sinus rhythm  Lab Results:  Recent Labs  08/28/12 0400  WBC 5.9  HGB 10.8*  PLT 172    Recent Labs  08/28/12 0400  NA 135  K 3.6  CL 102  CO2 26  GLUCOSE 103*  BUN 10  CREATININE 0.59    Recent Labs  08/27/12 1212  TROPONINI <0.30   Hepatic Function Panel No results found for this basename: PROT, ALBUMIN, AST, ALT, ALKPHOS, BILITOT, BILIDIR, IBILI,  in the last 72 hours No results found for this basename: CHOL,  in the last 72 hours  Recent Labs  08/28/12 0400  INR 1.12    Imaging: Imaging results have been reviewed  Cardiac Studies:  Assessment/Plan:   Principal Problem:   Syncope Active Problems:   Sinus arrest   Pacemaker implanted 08/28/12   Cancer of breast-    Elevated blood pressure    PLAN: Site check in one week, follow up B/P then.  Corine Shelter PA-C Beeper [phone] 08/30/2012, 10:07 AM   Agree with note written by Corine Shelter [facility] OK for D/C home. ROV 1 wk for pacer/site check then ROV with Dr. Julieanne Cotton J 08/30/2012 12:57 PM

## 2012-08-30 NOTE — Progress Notes (Signed)
   Subjective:  Looks great this AM. No CP/SOB  Objective:  Temp:  [97.8 F (36.6 C)-98.6 F (37 C)] 98.3 F (36.8 C) (08/06 1610) Pulse Rate:  [83-88] 88 (08/06 0613) Resp:  [16] 16 (08/06 0613) BP: (110-153)/(53-64) 153/62 mmHg (08/06 0613) SpO2:  [99 %-100 %] 99 % (08/06 9604) Weight change:   Intake/Output from previous day: 08/05 0701 - 08/06 0700 In: 723 [P.O.:720; I.V.:3] Out: -   Intake/Output from this shift:    Physical Exam: General appearance: alert and no distress Neck: no adenopathy, no carotid bruit, no JVD, supple, symmetrical, trachea midline and thyroid not enlarged, symmetric, no tenderness/mass/nodules Lungs: clear to auscultation bilaterally Heart: regular rate and rhythm, S1, S2 normal, no murmur, click, rub or gallop Extremities: extremities normal, atraumatic, no cyanosis or edema  Lab Results: No results found for this or any previous visit (from the past 48 hour(s)).  Imaging: Imaging results have been reviewed  Assessment/Plan:   1. Principal Problem: 2.   Syncope 3. Active Problems: 4.   Sinus arrest 5.   Pacemaker implanted 08/28/12 6.   Cancer of breast-  7.   Elevated blood pressure 8.   Time Spent Directly with Patient:  20 minutes  Length of Stay:  LOS: 4 days   POD #2 dual chamber PTVPM for SA and syncope. Looks much better this AM. Exam benign. OK for D/C home. MLP 1 wk with pacer eval then ROV with DR. C  Willis Kuipers J 08/30/2012, 10:11 AM

## 2012-09-06 ENCOUNTER — Ambulatory Visit (INDEPENDENT_AMBULATORY_CARE_PROVIDER_SITE_OTHER): Payer: Medicare Other | Admitting: Cardiology

## 2012-09-06 ENCOUNTER — Encounter: Payer: Self-pay | Admitting: Cardiology

## 2012-09-06 VITALS — BP 180/80 | HR 68 | Ht 66.0 in | Wt 128.0 lb

## 2012-09-06 DIAGNOSIS — I519 Heart disease, unspecified: Secondary | ICD-10-CM | POA: Diagnosis not present

## 2012-09-06 DIAGNOSIS — R030 Elevated blood-pressure reading, without diagnosis of hypertension: Secondary | ICD-10-CM | POA: Diagnosis not present

## 2012-09-06 DIAGNOSIS — Z950 Presence of cardiac pacemaker: Secondary | ICD-10-CM

## 2012-09-06 DIAGNOSIS — I5189 Other ill-defined heart diseases: Secondary | ICD-10-CM | POA: Insufficient documentation

## 2012-09-06 DIAGNOSIS — IMO0001 Reserved for inherently not codable concepts without codable children: Secondary | ICD-10-CM

## 2012-09-06 MED ORDER — LISINOPRIL 10 MG PO TABS: 10.0000 mg | ORAL_TABLET | Freq: Every day | ORAL | Status: DC

## 2012-09-06 NOTE — Patient Instructions (Signed)
Start Lisinopril 10 mg daily, before bed  See Dr Royann Shivers 4 weeks

## 2012-09-06 NOTE — Progress Notes (Signed)
09/06/2012 Tamara [name]   12/14/1929  [id number]  Primary Physicia No primary provider on file. Primary Cardiologist: Dr Royann Shivers  HPI:  Pleasant 79 y/o female with no prior history of CAD, HTN, or DM- admiited with syncope and found to have sinus pauses. She underwent MDT pacemaker by Dr Royann Shivers 08/28/12. She tolerated this well. Echo showed NL LVF with grade 2 diastolic dysfunction. Her B/P was slightly elevated in the hospital but she was reluctant to start medications then. Today it is again elevated. She did have some problems with constipation in the hospital but that has resolved/.   Current Outpatient Prescriptions  Medication Sig Dispense Refill  . acetaminophen (TYLENOL) 325 MG tablet Take 2 tablets (650 mg total) by mouth every 4 (four) hours as needed.      . B Complex-C (SUPER B COMPLEX PO) Take by mouth every other day.      . Cholecalciferol (VITAMIN D) 2000 UNITS tablet Take 2,000 Units by mouth daily with breakfast.      . Cyanocobalamin (VITAMIN B-12 PO) Take by mouth every other day.      Marland Kitchen GLUCOSAMINE-CHONDROITIN PO Take 1 capsule by mouth daily with breakfast.      . Multiple Vitamin (MULTIVITAMIN WITH MINERALS) TABS Take 1 tablet by mouth daily with breakfast.      . Multiple Vitamins-Minerals (ANTIOXIDANT FORMULA SG) capsule Take 1 capsule by mouth daily.      . Omega-3 Fatty Acids (OMEGA 3 PO) Take 1 capsule by mouth daily with breakfast.      . Probiotic Product (ALIGN PO) Take 1 capsule by mouth daily with breakfast.      . lisinopril (PRINIVIL,ZESTRIL) 10 MG tablet Take 1 tablet (10 mg total) by mouth daily.  30 tablet  11   No current facility-administered medications for this visit.    Allergies  Allergen Reactions  . Codeine Nausea And Vomiting    History   Social History  . Marital Status: Married    Spouse Name: N/A    Number of Children: N/A  . Years of Education: N/A   Occupational History  . Not on file.   Social History Main Topics  .  Smoking status: Never Smoker   . Smokeless tobacco: Not on file  . Alcohol Use: No  . Drug Use: No  . Sexual Activity: Not on file   Other Topics Concern  . Not on file   Social History Narrative  . No narrative on file     Review of Systems: General: negative for chills, fever, night sweats or weight changes.  Cardiovascular: negative for chest pain, dyspnea on exertion, edema, orthopnea, palpitations, paroxysmal nocturnal dyspnea or shortness of breath Dermatological: negative for rash Respiratory: negative for cough or wheezing Urologic: negative for hematuria Abdominal: negative for nausea, vomiting, diarrhea, bright red blood per rectum, melena, or hematemesis Neurologic: negative for visual changes, syncope, or dizziness All other systems reviewed and are otherwise negative except as noted above.    Blood pressure 180/80, pulse 68, height 5\' 6"  (1.676 m), weight 128 lb (58.06 kg).  General appearance: alert, cooperative and no distress Lungs: clear to auscultation bilaterally Heart: regular rate and rhythm, S1, S2 normal, no murmur, click, rub or gallop Pacer site without hematoma, staples removed  .  ASSESSMENT AND PLAN:   Pacemaker implanted 08/28/12 Staples removed without problem  Elevated blood pressure Start Lisinopril 10 mg  Diastolic dysfunction- 2 .    PLAN  She had constipation so will avoid  Ca++ blocker- add Lisinopril 10mg  f/u B/P and BMP when she sees Dr Royann Shivers in a month.    [facility] 09/06/2012 3:45 PM

## 2012-09-06 NOTE — Assessment & Plan Note (Signed)
Start Lisinopril 10 mg.   

## 2012-09-06 NOTE — Assessment & Plan Note (Signed)
Staples removed without problem

## 2012-09-12 ENCOUNTER — Telehealth: Payer: Self-pay | Admitting: Cardiovascular Disease

## 2012-09-12 NOTE — Telephone Encounter (Signed)
Pt is calling in her BP readings and wanted to check on her hemoglobin count??? She stated that she is very tired.

## 2012-09-12 NOTE — Telephone Encounter (Signed)
BPs ranged from 121-158/60-89 w/ HR 65-80.  Pt was checking BP at different times throughout the day and multiple times daily.

## 2012-09-12 NOTE — Telephone Encounter (Signed)
I informed pt that Dr.Croitoru wanted her to have her ppm interrogated to make sure that the VP wasn't the cause of her sxms. Patient voiced understanding and will follow up tomorrow @ 3:45pm for a pacer check.

## 2012-09-12 NOTE — Telephone Encounter (Signed)
Returned call.  Pt with concerns about BP and Hgb.  Stated her PCP has been on vacation since "all of this" w/ the pacemaker and is back now.  Stated she has an appt to see him tomorrow at 4:45pm and will ask him to check her blood counts.  Stated she thinks her Hgb might be low b/c she has no energy and is weak.  Also wants to know if the lisinopril is appropriate.  Pt listed BPs and RN informed BP is lower than it was at visit and her symptoms could be a combination of lower BP and possible lower Hgb.  Pt advised to f/u with PCP tomorrow and ask for labs.  Also advised she continue current dose of lisinopril as BPs are lower, but could be better.  Pt also advised to check BP 1-2 times daily and when symptomatic as pt has been checking it 3-4 times some days.  Advised to take BP in AM and in PM.  Pt verbalized understanding and agreed w/ plan.    Message forwarded to Dr. Royann Shivers [facility]).

## 2012-09-13 ENCOUNTER — Ambulatory Visit (INDEPENDENT_AMBULATORY_CARE_PROVIDER_SITE_OTHER): Payer: Medicare Other | Admitting: *Deleted

## 2012-09-13 DIAGNOSIS — K5900 Constipation, unspecified: Secondary | ICD-10-CM | POA: Diagnosis not present

## 2012-09-13 DIAGNOSIS — Z5181 Encounter for therapeutic drug level monitoring: Secondary | ICD-10-CM | POA: Diagnosis not present

## 2012-09-13 DIAGNOSIS — I495 Sick sinus syndrome: Secondary | ICD-10-CM

## 2012-09-13 DIAGNOSIS — I10 Essential (primary) hypertension: Secondary | ICD-10-CM | POA: Diagnosis not present

## 2012-09-13 DIAGNOSIS — D500 Iron deficiency anemia secondary to blood loss (chronic): Secondary | ICD-10-CM | POA: Diagnosis not present

## 2012-09-13 DIAGNOSIS — Z950 Presence of cardiac pacemaker: Secondary | ICD-10-CM | POA: Diagnosis not present

## 2012-09-13 NOTE — Progress Notes (Signed)
In office pacemaker interrogation to evaluate possible causes of fatigue. Normal device function. No changes made this session.

## 2012-09-14 LAB — PACEMAKER DEVICE OBSERVATION
AL AMPLITUDE: 0.7 mv
RV LEAD AMPLITUDE: 8 mv
RV LEAD IMPEDENCE PM: 626 Ohm
RV LEAD THRESHOLD: 0.5 V
VENTRICULAR PACING PM: 0.8

## 2012-10-04 ENCOUNTER — Encounter: Payer: Self-pay | Admitting: Cardiovascular Disease

## 2012-10-04 ENCOUNTER — Ambulatory Visit (INDEPENDENT_AMBULATORY_CARE_PROVIDER_SITE_OTHER): Payer: Medicare Other | Admitting: Cardiovascular Disease

## 2012-10-04 VITALS — BP 200/86 | HR 80 | Ht 65.5 in | Wt 128.7 lb

## 2012-10-04 DIAGNOSIS — Z950 Presence of cardiac pacemaker: Secondary | ICD-10-CM

## 2012-10-04 DIAGNOSIS — I455 Other specified heart block: Secondary | ICD-10-CM

## 2012-10-04 DIAGNOSIS — R030 Elevated blood-pressure reading, without diagnosis of hypertension: Secondary | ICD-10-CM

## 2012-10-04 LAB — PACEMAKER DEVICE OBSERVATION

## 2012-10-04 MED ORDER — LISINOPRIL 10 MG PO TABS: 10.0000 mg | ORAL_TABLET | Freq: Every day | ORAL | Status: DC

## 2012-10-04 NOTE — Progress Notes (Signed)
Patient ID: Tamara [name], female   DOB: [birth year], 79 y.o.   MRN: [id number]     Reason for office visit Followup pacemaker implantation for sinus node arrest  Mrs. O[facility]ner presented with recurrent syncope associated with prolonged sinus pauses and underwent implantation of a dual-chamber permanent pacemaker on August 4 ([employer] Adapta). The surgical site has healed well and she has not experienced any further syncope. She still describes occasional unusual feelings in her head that she has a very difficult time describing. These are sometimes triggered by leaning her head backward. It sounds that they have vestibular etiology. They're not associated with changes in whole-body position and did not somewhat postural hypotension.  Her blood pressure in the clinic today is quite high but she brings a very detailed record of blood pressure taken once or twice daily over the last 2 weeks. Her typical blood pressure is in the mid 130s over 70 range with occasional systolic blood pressure in excess of 140 mm Hg. She is currently taking lisinopril 10 mg daily and would like to be "off all these medications".  A full pacemaker check is performed in the clinic today. Her lead parameters are turned to chronic levels. There have been no episodes of meaningful atrial or ventricular tachyarrhythmia. All device parameters are within the desirable range. She'll be enrolled in the [facility] system.    Allergies  Allergen Reactions  . Codeine Nausea And Vomiting    Current Outpatient Prescriptions  Medication Sig Dispense Refill  . acetaminophen (TYLENOL) 325 MG tablet Take 2 tablets (650 mg total) by mouth every 4 (four) hours as needed.      . B Complex-C (SUPER B COMPLEX PO) Take by mouth every other day.      . Cholecalciferol (VITAMIN D) 2000 UNITS tablet Take 2,000 Units by mouth daily with breakfast.      . Cyanocobalamin (VITAMIN B-12 PO) Take by mouth every other day.      Marland Kitchen  GLUCOSAMINE-CHONDROITIN PO Take 1 capsule by mouth daily with breakfast.      . lisinopril (PRINIVIL,ZESTRIL) 10 MG tablet Take 1 tablet (10 mg total) by mouth daily.  90 tablet  3  . Multiple Vitamin (MULTIVITAMIN WITH MINERALS) TABS Take 1 tablet by mouth daily with breakfast.      . Multiple Vitamins-Minerals (ANTIOXIDANT FORMULA SG) capsule Take 1 capsule by mouth daily.      . Omega-3 Fatty Acids (OMEGA 3 PO) Take 1 capsule by mouth daily with breakfast.      . Probiotic Product (ALIGN PO) Take 1 capsule by mouth daily with breakfast.       No current facility-administered medications for this visit.    Past Medical History  Diagnosis Date  . Breast cancer   . HTN (hypertension) August 2014  . Syncope and collapse August 2014    sinus pauses/ PTVDP    Past Surgical History  Procedure Laterality Date  . Breast lumpectomy    . Cholecystectomy    . Tonsillectomy    . Pacemaker insertion  08/28/12    MDT    Family History  Problem Relation Age of Onset  . Alzheimer's disease Mother   . Renal Disease Father   . Coronary artery disease Brother     History   Social History  . Marital Status: Married    Spouse Name: N/A    Number of Children: N/A  . Years of Education: N/A   Occupational History  . Not on file.  Social History Main Topics  . Smoking status: Never Smoker   . Smokeless tobacco: Not on file  . Alcohol Use: No  . Drug Use: No  . Sexual Activity: Not on file   Other Topics Concern  . Not on file   Social History Narrative  . No narrative on file    Review of systems: I think she is describing vertigo. The patient specifically denies any chest pain at rest or with exertion, dyspnea at rest or with exertion, orthopnea, paroxysmal nocturnal dyspnea, syncope, palpitations, focal neurological deficits, intermittent claudication, lower extremity edema, unexplained weight gain, cough, hemoptysis or wheezing.  The patient also denies abdominal pain,  nausea, vomiting, dysphagia, diarrhea, constipation, polyuria, polydipsia, dysuria, hematuria, frequency, urgency, abnormal bleeding or bruising, fever, chills, unexpected weight changes, mood swings, change in skin or hair texture, change in voice quality, auditory or visual problems, allergic reactions or rashes, new musculoskeletal complaints other than usual "aches and pains".   PHYSICAL EXAM BP 200/86  Pulse 80  Ht 5' 5.5" (1.664 m)  Wt 128 lb 11.2 oz (58.378 kg)  BMI 21.08 kg/m2  General: Alert, oriented x3, no distress Head: no evidence of trauma, PERRL, EOMI, no exophtalmos or lid lag, no myxedema, no xanthelasma; normal ears, nose and oropharynx Neck: normal jugular venous pulsations and no hepatojugular reflux; brisk carotid pulses without delay and no carotid bruits Chest: clear to auscultation, no signs of consolidation by percussion or palpation, normal fremitus, symmetrical and full respiratory excursions; subclavian pacemaker site appears well-healed  Cardiovascular: normal position and quality of the apical impulse, regular rhythm, normal first and second heart sounds, no murmurs, rubs or gallops Abdomen: no tenderness or distention, no masses by palpation, no abnormal pulsatility or arterial bruits, normal bowel sounds, no hepatosplenomegaly Extremities: no clubbing, cyanosis or edema; 2+ radial, ulnar and brachial pulses bilaterally; 2+ right femoral, posterior tibial and dorsalis pedis pulses; 2+ left femoral, posterior tibial and dorsalis pedis pulses; no subclavian or femoral bruits Neurological: grossly nonfocal   EKG: atrial paced ventricular sensed  Lipid Panel  No results found for this basename: chol, trig, hdl, cholhdl, vldl, ldlcalc    BMET    Component Value Date/Time   NA 135 08/28/2012 0400   K 3.6 08/28/2012 0400   CL 102 08/28/2012 0400   CO2 26 08/28/2012 0400   GLUCOSE 103* 08/28/2012 0400   BUN 10 08/28/2012 0400   CREATININE 0.59 08/28/2012 0400   CALCIUM  9.5 08/28/2012 0400   GFRNONAA 84* 08/28/2012 0400   GFRAA >90 08/28/2012 0400     ASSESSMENT AND PLAN Pacemaker implanted 08/28/12 Site has healed well. She will undergo [facility]Link monitoring every 3 months, followup in the clinic in one year  Sinus arrest Complicated by syncope, lead to pacemaker implantation  Elevated blood pressure It appears that she does have systemic hypertension but that this is well-controlled on the current ACE inhibitor therapy. Notwithstanding the elevated blood pressure in the clinic today, her usual blood pressure at home is well controlled. Onset of hypertension at her age is definitely unusual and may be a sign of renal artery stenosis. I'm not sure that our management would change at all if we confirm this diagnosis. ACE inhibitor would still be first-line therapy. Notes normal renal function parameters. Suspect a component of "white coat hypertension".  No orders of the defined types were placed in this encounter.   Meds ordered this encounter  Medications  . lisinopril (PRINIVIL,ZESTRIL) 10 MG tablet    Sig:  Take 1 tablet (10 mg total) by mouth daily.    Dispense:  90 tablet    Refill:  3    Order Specific Question:  Supervising Provider    Answer:  Runell Gess [[id number]]    Junious Silk, MD, [facility] [phone] office [phone] pager

## 2012-10-04 NOTE — Assessment & Plan Note (Signed)
Site has healed well. She will undergo [facility]Link monitoring every 3 months, followup in the clinic in one year

## 2012-10-04 NOTE — Assessment & Plan Note (Signed)
It appears that she does have systemic hypertension but that this is well-controlled on the current ACE inhibitor therapy. Notwithstanding the elevated blood pressure in the clinic today, her usual blood pressure at home is well controlled. Onset of hypertension at her age is definitely unusual and may be a sign of renal artery stenosis. I'm not sure that our management would change at all if we confirm this diagnosis. ACE inhibitor would still be first-line therapy. Notes normal renal function parameters. Suspect a component of "white coat hypertension".

## 2012-10-04 NOTE — Assessment & Plan Note (Signed)
Complicated by syncope, lead to pacemaker implantation

## 2012-10-04 NOTE — Patient Instructions (Addendum)
Remote monitoring is used to monitor your Pacemaker of ICD from home. This monitoring reduces the number of office visits required to check your device to one time per year. It allows [place] to keep an eye on the functioning of your device to ensure it is working properly. You are scheduled for a device check from home on 01-04-2013. You may send your transmission at any time that day. If you have a wireless device, the transmission will be sent automatically. After your physician reviews your transmission, you will receive a postcard with your next transmission date.  Your physician recommends that you schedule a follow-up appointment in: 1 year

## 2012-10-06 ENCOUNTER — Encounter: Payer: Self-pay | Admitting: Cardiovascular Disease

## 2012-10-16 DIAGNOSIS — D500 Iron deficiency anemia secondary to blood loss (chronic): Secondary | ICD-10-CM | POA: Diagnosis not present

## 2012-10-27 ENCOUNTER — Encounter: Payer: Self-pay | Admitting: Cardiovascular Disease

## 2012-11-26 ENCOUNTER — Emergency Department (HOSPITAL_COMMUNITY): Payer: Medicare Other

## 2012-11-26 ENCOUNTER — Inpatient Hospital Stay (HOSPITAL_COMMUNITY)
Admission: EM | Admit: 2012-11-26 | Discharge: 2012-11-30 | DRG: 394 | Disposition: A | Discharge status: Home / Self Care | Payer: Medicare Other | Attending: Internal Medicine | Admitting: Internal Medicine

## 2012-11-26 ENCOUNTER — Encounter (HOSPITAL_COMMUNITY): Payer: Self-pay | Admitting: Emergency Medicine

## 2012-11-26 DIAGNOSIS — R5381 Other malaise: Secondary | ICD-10-CM | POA: Diagnosis not present

## 2012-11-26 DIAGNOSIS — D649 Anemia, unspecified: Secondary | ICD-10-CM | POA: Diagnosis not present

## 2012-11-26 DIAGNOSIS — Z950 Presence of cardiac pacemaker: Secondary | ICD-10-CM | POA: Diagnosis present

## 2012-11-26 DIAGNOSIS — R197 Diarrhea, unspecified: Secondary | ICD-10-CM

## 2012-11-26 DIAGNOSIS — I519 Heart disease, unspecified: Secondary | ICD-10-CM

## 2012-11-26 DIAGNOSIS — Z79899 Other long term (current) drug therapy: Secondary | ICD-10-CM | POA: Diagnosis not present

## 2012-11-26 DIAGNOSIS — R1032 Left lower quadrant pain: Secondary | ICD-10-CM | POA: Diagnosis not present

## 2012-11-26 DIAGNOSIS — K922 Gastrointestinal hemorrhage, unspecified: Secondary | ICD-10-CM | POA: Diagnosis not present

## 2012-11-26 DIAGNOSIS — I509 Heart failure, unspecified: Secondary | ICD-10-CM | POA: Diagnosis present

## 2012-11-26 DIAGNOSIS — R079 Chest pain, unspecified: Secondary | ICD-10-CM | POA: Diagnosis not present

## 2012-11-26 DIAGNOSIS — I5032 Chronic diastolic (congestive) heart failure: Secondary | ICD-10-CM | POA: Diagnosis present

## 2012-11-26 DIAGNOSIS — K559 Vascular disorder of intestine, unspecified: Secondary | ICD-10-CM | POA: Diagnosis not present

## 2012-11-26 DIAGNOSIS — K2970 Gastritis, unspecified, without bleeding: Secondary | ICD-10-CM | POA: Diagnosis not present

## 2012-11-26 DIAGNOSIS — I10 Essential (primary) hypertension: Secondary | ICD-10-CM | POA: Diagnosis not present

## 2012-11-26 DIAGNOSIS — R109 Unspecified abdominal pain: Secondary | ICD-10-CM | POA: Diagnosis not present

## 2012-11-26 DIAGNOSIS — K921 Melena: Secondary | ICD-10-CM | POA: Diagnosis present

## 2012-11-26 DIAGNOSIS — K5289 Other specified noninfective gastroenteritis and colitis: Secondary | ICD-10-CM | POA: Diagnosis not present

## 2012-11-26 DIAGNOSIS — R55 Syncope and collapse: Secondary | ICD-10-CM | POA: Diagnosis not present

## 2012-11-26 DIAGNOSIS — R112 Nausea with vomiting, unspecified: Secondary | ICD-10-CM | POA: Diagnosis not present

## 2012-11-26 DIAGNOSIS — Z853 Personal history of malignant neoplasm of breast: Secondary | ICD-10-CM

## 2012-11-26 DIAGNOSIS — I455 Other specified heart block: Secondary | ICD-10-CM | POA: Diagnosis present

## 2012-11-26 DIAGNOSIS — K529 Noninfective gastroenteritis and colitis, unspecified: Secondary | ICD-10-CM | POA: Diagnosis present

## 2012-11-26 DIAGNOSIS — I5189 Other ill-defined heart diseases: Secondary | ICD-10-CM | POA: Diagnosis present

## 2012-11-26 LAB — COMPREHENSIVE METABOLIC PANEL
BUN: 16 mg/dL (ref 6–23)
CO2: 22 mEq/L (ref 19–32)
Chloride: 99 mEq/L (ref 96–112)
Creatinine, Ser: 0.59 mg/dL (ref 0.50–1.10)
GFR calc Af Amer: >90 mL/min (ref >90)
GFR calc non Af Amer: 84 mL/min — ABNORMAL LOW (ref >90)
Total Bilirubin: 0.3 mg/dL (ref 0.3–1.2)

## 2012-11-26 LAB — CBC WITH DIFFERENTIAL/PLATELET
HCT: 35.9 % — ABNORMAL LOW (ref 36.0–46.0)
Hemoglobin: 12.5 g/dL (ref 12.0–15.0)
Lymphocytes Relative: 8 % — ABNORMAL LOW (ref 12–46)
MCHC: 34.8 g/dL (ref 30.0–36.0)
MCV: 88.9 fL (ref 78.0–100.0)
Monocytes Absolute: 0.7 10*3/uL (ref 0.1–1.0)
Monocytes Relative: 5 % (ref 3–12)
Neutro Abs: 11.7 10*3/uL — ABNORMAL HIGH (ref 1.7–7.7)
WBC: 13.5 10*3/uL — ABNORMAL HIGH (ref 4.0–10.5)

## 2012-11-26 LAB — URINALYSIS, ROUTINE W REFLEX MICROSCOPIC
Ketones, ur: >80 mg/dL — AB
Leukocytes, UA: NEGATIVE
Nitrite: NEGATIVE
Protein, ur: NEGATIVE mg/dL

## 2012-11-26 LAB — LIPASE, BLOOD: Lipase: 31 U/L (ref 11–59)

## 2012-11-26 LAB — TROPONIN I: Troponin I: <0.3 ng/mL (ref <0.30)

## 2012-11-26 MED ORDER — SODIUM CHLORIDE 0.9 % IV BOLUS (SEPSIS)
1000.0000 mL | Freq: Once | INTRAVENOUS | Status: AC
  Administered 2012-11-26: 1000 mL via INTRAVENOUS

## 2012-11-26 MED ORDER — CIPROFLOXACIN IN D5W 400 MG/200ML IV SOLN
400.0000 mg | Freq: Two times a day (BID) | INTRAVENOUS | Status: DC
  Administered 2012-11-26 – 2012-11-28 (×5): 400 mg via INTRAVENOUS
  Filled 2012-11-26 (×6): qty 200

## 2012-11-26 MED ORDER — ONDANSETRON HCL 4 MG PO TABS: 4.0000 mg | ORAL_TABLET | Freq: Four times a day (QID) | ORAL | Status: DC | PRN

## 2012-11-26 MED ORDER — HYDROMORPHONE HCL PF 1 MG/ML IJ SOLN: 0.5000 mg | INTRAMUSCULAR | Status: DC | PRN

## 2012-11-26 MED ORDER — ONDANSETRON HCL 4 MG/2ML IJ SOLN
4.0000 mg | Freq: Four times a day (QID) | INTRAMUSCULAR | Status: DC | PRN
  Administered 2012-11-28: 4 mg via INTRAVENOUS
  Filled 2012-11-26: qty 2

## 2012-11-26 MED ORDER — IOHEXOL 300 MG/ML  SOLN: 25.0000 mL | INTRAMUSCULAR | Status: DC

## 2012-11-26 MED ORDER — SODIUM CHLORIDE 0.9 % IV SOLN
INTRAVENOUS | Status: DC
  Administered 2012-11-26: 100 mL/h via INTRAVENOUS

## 2012-11-26 MED ORDER — SODIUM CHLORIDE 0.9 % IV BOLUS (SEPSIS)
1000.0000 mL | Freq: Once | INTRAVENOUS | Status: AC
Start: 2012-11-26 — End: 2012-11-26
  Administered 2012-11-26: 1000 mL via INTRAVENOUS

## 2012-11-26 MED ORDER — ACETAMINOPHEN 650 MG RE SUPP: 650.0000 mg | Freq: Four times a day (QID) | RECTAL | Status: DC | PRN

## 2012-11-26 MED ORDER — SODIUM CHLORIDE 0.9 % IJ SOLN
3.0000 mL | Freq: Two times a day (BID) | INTRAMUSCULAR | Status: DC
  Administered 2012-11-27 – 2012-11-28 (×2): 3 mL via INTRAVENOUS

## 2012-11-26 MED ORDER — IOHEXOL 300 MG/ML  SOLN
100.0000 mL | Freq: Once | INTRAMUSCULAR | Status: AC | PRN
  Administered 2012-11-26: 100 mL via INTRAVENOUS

## 2012-11-26 MED ORDER — ACETAMINOPHEN 325 MG PO TABS: 650.0000 mg | ORAL_TABLET | Freq: Four times a day (QID) | ORAL | Status: DC | PRN

## 2012-11-26 MED ORDER — METRONIDAZOLE IN NACL 5-0.79 MG/ML-% IV SOLN
500.0000 mg | Freq: Three times a day (TID) | INTRAVENOUS | Status: DC
  Administered 2012-11-26 – 2012-11-29 (×8): 500 mg via INTRAVENOUS
  Filled 2012-11-26 (×9): qty 100

## 2012-11-26 NOTE — ED Provider Notes (Signed)
Report from [employer]:  No episodes of atrial ventricular tachycardia. Normal threshold. No impedance changes to suggest lead malfunction. No reported episodes of pacemaker activity versus her most recent interrogation.Roney Marion, MD 11/26/12 [phone]

## 2012-11-26 NOTE — ED Notes (Signed)
[facility], lab called with lactic acid 1.81 normal

## 2012-11-26 NOTE — ED Notes (Signed)
Results of lactic acid called to nurse Carla in [place]

## 2012-11-26 NOTE — H&P (Signed)
[facility] History and Physical  KRISTE BROMAN WUJ:[id number] DOB: 1929-05-18 DOA: 11/26/2012  Referring physician: Rolland Porter PCP: No primary provider on file.  Specialists: [facility]  Chief Complaint: Abdominal pain  HPI: Tamara [name] is a 79 y.o. female was past medical history of SSS status post permanent pacemaker placement in August of this year, hypertension and history of breast cancer. Patient came to the hospital with left lower quadrant abdominal pain. Patient was in her usual state of health until about noontime today when she felt to have to go to the bathroom and then she started to have left lower quadrant abdominal pain, she has 10.5/10, no radiation, no relieving or exacerbating factors. Patient vomited 3 times, had 3 loose bowel movements, last one was bloody which was seen by me in the emergency department. Patient also said she lost her consciousness when she was having bowel movement, and again she did that when she was in the bed. She attributed that to the severe pain she had. In the ED CT scan of abdomen pelvis showed descending colitis without clear evidence of diverticulitis. Patient has WBC of 13.5. She will be admitted to the hospital for further evaluation.  Review of Systems: Constitutional: Syncope Eyes: negative for irritation, redness and visual disturbance Ears, nose, mouth, throat, and face: negative for earaches, epistaxis, nasal congestion and sore throat Respiratory: negative for cough, dyspnea on exertion, sputum and wheezing Cardiovascular: negative for chest pain, dyspnea, lower extremity edema, orthopnea, palpitations and syncope Gastrointestinal: Per history of present illness, nausea/vomiting, abdominal pain and diarrhea. Genitourinary:negative for dysuria, frequency and hematuria Hematologic/lymphatic: negative for bleeding, easy bruising and lymphadenopathy Musculoskeletal:negative for arthralgias, muscle weakness and stiff  joints Neurological: negative for coordination problems, gait problems, headaches and weakness Endocrine: negative for diabetic symptoms including polydipsia, polyuria and weight loss Allergic/Immunologic: negative for anaphylaxis, hay fever and urticaria   Past Medical History  Diagnosis Date  . Breast cancer   . HTN (hypertension) August 2014  . Syncope and collapse August 2014    sinus pauses/ PTVDP   Past Surgical History  Procedure Laterality Date  . Breast lumpectomy    . Cholecystectomy    . Tonsillectomy    . Pacemaker insertion  08/28/12    MDT   Social History:  reports that she has never smoked. She does not have any smokeless tobacco history on file. She reports that she does not drink alcohol or use illicit drugs.  Allergies  Allergen Reactions  . Codeine Nausea And Vomiting    Family History  Problem Relation Age of Onset  . Alzheimer's disease Mother   . Renal Disease Father   . Coronary artery disease Brother    Prior to Admission medications   Medication Sig Start Date End Date Taking? Authorizing Provider  acetaminophen (TYLENOL) 325 MG tablet Take 2 tablets (650 mg total) by mouth every 4 (four) hours as needed. 08/30/12  Yes Luke K Kilroy, PA-C  B Complex-C (SUPER B COMPLEX PO) Take by mouth every other day.   Yes Historical Provider, MD  Cholecalciferol (VITAMIN D) 2000 UNITS tablet Take 2,000 Units by mouth daily with breakfast.   Yes Historical Provider, MD  Cyanocobalamin (VITAMIN B-12 PO) Take by mouth every other day.   Yes Historical Provider, MD  GLUCOSAMINE-CHONDROITIN PO Take 1 capsule by mouth daily with breakfast.   Yes Historical Provider, MD  lisinopril (PRINIVIL,ZESTRIL) 10 MG tablet Take 1 tablet (10 mg total) by mouth daily. 10/04/12  Yes Thurmon Fair, MD  Multiple Vitamin (MULTIVITAMIN WITH MINERALS) TABS Take 1 tablet by mouth daily with breakfast.   Yes Historical Provider, MD  Multiple Vitamins-Minerals (ANTIOXIDANT FORMULA SG) capsule  Take 1 capsule by mouth daily.   Yes Historical Provider, MD  Omega-3 Fatty Acids (OMEGA 3 PO) Take 1 capsule by mouth daily with breakfast.   Yes Historical Provider, MD  Probiotic Product (ALIGN PO) Take 1 capsule by mouth daily with breakfast.   Yes Historical Provider, MD   Physical Exam: Filed Vitals:   11/26/12 1615  BP: 170/77  Pulse:   Temp:   Resp: 18   General appearance: alert, cooperative and no distress  Head: Normocephalic, without obvious abnormality, atraumatic  Eyes: conjunctivae/corneas clear. PERRL, EOM's intact. Fundi benign.  Nose: Nares normal. Septum midline. Mucosa normal. No drainage or sinus tenderness.  Throat: lips, mucosa, and tongue normal; teeth and gums normal  Neck: Supple, no masses, no cervical lymphadenopathy, no JVD appreciated, no meningeal signs Resp: clear to auscultation bilaterally  Chest wall: no tenderness  Cardio: regular rate and rhythm, S1, S2 normal, no murmur, click, rub or gallop  GI: soft, moderate LLQ abdominal tenderness Extremities: extremities normal, atraumatic, no cyanosis or edema  Skin: Skin color, texture, turgor normal. No rashes or lesions  Neurologic: Alert and oriented X 3, normal strength and tone. Normal symmetric reflexes. Normal coordination and gait   Labs on Admission:  Basic Metabolic Panel:  Recent Labs Lab 11/26/12 1352  NA 133*  K 3.9  CL 99  CO2 22  GLUCOSE 125*  BUN 16  CREATININE 0.59  CALCIUM 9.9   Liver Function Tests:  Recent Labs Lab 11/26/12 1352  AST 32  ALT 24  ALKPHOS 134*  BILITOT 0.3  PROT 7.4  ALBUMIN 3.9    Recent Labs Lab 11/26/12 1352  LIPASE 31   No results found for this basename: AMMONIA,  in the last 168 hours CBC:  Recent Labs Lab 11/26/12 1352  WBC 13.5*  NEUTROABS 11.7*  HGB 12.5  HCT 35.9*  MCV 88.9  PLT 177   Cardiac Enzymes:  Recent Labs Lab 11/26/12 1352  TROPONINI <0.30    BNP (last 3 results) No results found for this basename:  PROBNP,  in the last 8760 hours CBG: No results found for this basename: GLUCAP,  in the last 168 hours  Radiological Exams on Admission: Dg Chest 2 View  11/26/2012   CLINICAL DATA:  Pain  EXAM: CHEST  2 VIEW  COMPARISON:  08/29/2012  FINDINGS: A pacing device is again seen. The cardiac shadow is stable. The lungs are well aerated bilaterally without focal infiltrate or sizable effusion. No acute bony abnormality is seen.  IMPRESSION: No acute abnormality noted.   Electronically Signed   By: Alcide Clever M.D.   On: 11/26/2012 15:31   Ct Abdomen Pelvis W Contrast  11/26/2012   CLINICAL DATA:  Abdominal pain  EXAM: CT ABDOMEN AND PELVIS WITH CONTRAST  TECHNIQUE: Multidetector CT imaging of the abdomen and pelvis was performed using the standard protocol following bolus administration of intravenous contrast.  CONTRAST:  OMNIPAQUE IOHEXOL 300 MG/ML  SOLN  COMPARISON:  08/26/2012  FINDINGS: The lung bases are free of acute infiltrate or sizable effusion.  The gallbladder is been surgically removed. Mild biliary ductal dilatation is noted but stable from the previous exam. The spleen, adrenal glands, kidneys and pancreas are stable in appearance from the previous exam. The  Diffuse diverticular change of the colon is noted without definitive  diverticulitis. There is bowel wall edema identified from the level of the splenic flexure extending into the rectosigmoid region. Pericolonic inflammatory changes are noted as well. These changes are new from the prior exam and would suggest a degree of ischemia although the central vasculature of the bowel appears within normal limits. These changes could be related to inflammatory change is well. The appendix is not well visualized. No inflammatory changes are seen. No pelvic mass or abnormality is noted.  IMPRESSION: New bowel wall edema and pericolonic inflammatory changes seen in the descending colon towards the rectosigmoid region. These changes suggest  ischemic etiology although an infectious etiology would deserve consideration as well.   Electronically Signed   By: Alcide Clever M.D.   On: 11/26/2012 16:59    EKG: Independently reviewed.   Assessment/Plan Principal Problem:   Colitis Active Problems:   Sinus arrest   Syncope   Pacemaker implanted 08/28/12   Diastolic dysfunction- 2   Nausea and vomiting   LLQ abdominal pain   Colitis -As mentioned above CT scan showed descending colitis with no evidence of diverticulitis. -Could be ischemic colitis versus infectious. Check stool studies. -Blood pressure is normal and rather high in the emergency department. -We'll discontinue blood pressure medications, hydrate aggressively with IV fluids. -Started on Cipro and Flagyl, n.p.o. except for ice chips. -Consider GI consultation a.m.  Syncope -Likely vasovagal syncope, first one she had when she was having bowel movement. -Was unable to stand up secondary to weakness to check for orthostatic blood pressure. -Hydrate aggressively with IV fluids, check orthostatic blood pressure in them. -Pacemaker interrogated in the emergency department, no significant events.  Nausea and vomiting -Secondary to a live this, symptomatic management with antiemetics.  Chronic diastolic CHF -Patient is not on diuretics, we'll be very careful with IV fluids as patient has grade 2 diastolic dysfunction. -Assess the need of IV fluids on daily basis.  Code Status: Full code Family Communication: Plan discussed with the patient in the presence of her husband at bedside. Disposition Plan: Inpatient, telemetry, anticipated length of stay to be greater than 2 midnights.  Time spent: 70 minutes  [facility] Pager [phone]  If 7PM-7AM, please contact night-coverage [email or web address] Password [facility] 11/26/2012, 6:39 PM

## 2012-11-26 NOTE — ED Notes (Signed)
Patient stated that she was unable to stand for orthostatic vital signs because she was too weak.

## 2012-11-26 NOTE — ED Notes (Signed)
Per pt and ems since this am lower abdominal pain with N,V. sts syncope due to pain. sts woke up on the floor twice this am. 10/10 sharp pain. sts all of this started after breakfast. Pt had BM in the ambulance on the way and feels better. Pt paced on the monitor. zofran in route x2. Right arm for BP. BP 200/97. Initially. 188/90. Pulse in the 90s. 100% RA. 20 g RAC.

## 2012-11-26 NOTE — ED Notes (Signed)
Pt pacemaker interrogated and transmitted.

## 2012-11-26 NOTE — ED Notes (Signed)
Xray called wanting to know if PA wanted abd series done, pt in xray c/o abd pain.  Per PA no abd series.  Pt is now back to room without chest xray done.  Pt feels like she is going to pass out and has stooled.

## 2012-11-26 NOTE — ED Notes (Signed)
Patient transported to X-ray 

## 2012-11-26 NOTE — ED Provider Notes (Signed)
CSN: [id number]     Arrival date & time 11/26/12  1231 History   First MD Initiated Contact with Patient 11/26/12 1234     Chief Complaint  Patient presents with  . Abdominal Pain   (Consider location/radiation/quality/duration/timing/severity/associated sxs/prior Treatment) The history is provided by the patient. No language interpreter was used.  Tamara [name] is an 79 year old female with past medical history breast cancer, hypertension, sick sinus syndrome, syncope, placement of dual-chamber MDT pacemaker in August of 2014, brought in by EMS, presenting with abdominal pain with acute onset this morning at approximately 8:15-8:30 AM. Patient reported that after breakfast she started to experience a sharp shooting pain in her lower abdomen. Patient reported that she felt like she had to go use the bathroom, reported that she was unable to make a bowel movement. Reported that the pain progressively gotten worse and she began to feel nauseous. Patient reports that due to the exacerbation of pain she had 3 syncopal episodes-one when coming the bathroom, one when getting into bed, and one when walking toward her husband. Patient reports that prior to the syncopal episodes she felt like she was weakening, felt like her head was closing in on her. Patient reports that she had 3 episodes of emesis, mainly of food contents-nonbilious and nonbloody. Patient reports she has continuous abdominal pain localized the lower abdomen, sharp shooting pains with radiation towards the back. Patient reported that she was shaking had chills, and sweats while en route to the ED. Patient reported that she had 2 bowel movements while in ED setting, reported that they were rather loose - denied hematochezia, melena. Denied chest pain, shortness of breath, difficulty breathing, fever, numbness, tingling, dizziness, blurred vision, visual distortions, headache. PCP none Cardiologist: [facility]  Past Medical History  Diagnosis  Date  . Breast cancer   . HTN (hypertension) August 2014  . Syncope and collapse August 2014    sinus pauses/ PTVDP   Past Surgical History  Procedure Laterality Date  . Breast lumpectomy    . Cholecystectomy    . Tonsillectomy    . Pacemaker insertion  08/28/12    MDT   Family History  Problem Relation Age of Onset  . Alzheimer's disease Mother   . Renal Disease Father   . Coronary artery disease Brother    History  Substance Use Topics  . Smoking status: Never Smoker   . Smokeless tobacco: Not on file  . Alcohol Use: No   OB History   Grav Para Term Preterm Abortions TAB SAB Ect Mult Living                 Review of Systems  Constitutional: Negative for fever and chills.  Respiratory: Negative for chest tightness and shortness of breath.   Cardiovascular: Negative for chest pain.  Gastrointestinal: Positive for nausea, vomiting, abdominal pain and diarrhea. Negative for constipation, blood in stool and anal bleeding.  Musculoskeletal: Positive for back pain.  Neurological: Positive for syncope and weakness. Negative for dizziness.  All other systems reviewed and are negative.    Allergies  Codeine  Home Medications   Current Outpatient Rx  Name  Route  Sig  Dispense  Refill  . acetaminophen (TYLENOL) 325 MG tablet   Oral   Take 2 tablets (650 mg total) by mouth every 4 (four) hours as needed.         . B Complex-C (SUPER B COMPLEX PO)   Oral   Take by mouth every other day.         Marland Kitchen  Cholecalciferol (VITAMIN D) 2000 UNITS tablet   Oral   Take 2,000 Units by mouth daily with breakfast.         . Cyanocobalamin (VITAMIN B-12 PO)   Oral   Take by mouth every other day.         Marland Kitchen GLUCOSAMINE-CHONDROITIN PO   Oral   Take 1 capsule by mouth daily with breakfast.         . lisinopril (PRINIVIL,ZESTRIL) 10 MG tablet   Oral   Take 1 tablet (10 mg total) by mouth daily.   90 tablet   3   . Multiple Vitamin (MULTIVITAMIN WITH MINERALS) TABS    Oral   Take 1 tablet by mouth daily with breakfast.         . Multiple Vitamins-Minerals (ANTIOXIDANT FORMULA SG) capsule   Oral   Take 1 capsule by mouth daily.         . Omega-3 Fatty Acids (OMEGA 3 PO)   Oral   Take 1 capsule by mouth daily with breakfast.         . Probiotic Product (ALIGN PO)   Oral   Take 1 capsule by mouth daily with breakfast.          BP 150/56  Pulse 61  Temp(Src) 97.8 F (36.6 C) (Oral)  Resp 19  SpO2 100% Physical Exam  Nursing note and vitals reviewed. Constitutional: She is oriented to person, place, and time. She appears well-developed and well-nourished. No distress.  HENT:  Head: Normocephalic and atraumatic.  Mouth/Throat: Oropharynx is clear and moist. No oropharyngeal exudate.  Eyes: Conjunctivae and EOM are normal. Pupils are equal, round, and reactive to light. Right eye exhibits no discharge. Left eye exhibits no discharge.  Neck: Normal range of motion. Neck supple.  Cardiovascular: Normal rate, regular rhythm and normal heart sounds.  Exam reveals no friction rub.   No murmur heard. Pacemaker present  Pulmonary/Chest: Effort normal and breath sounds normal. No respiratory distress. She has no wheezes. She has no rales.  Abdominal: Soft. Bowel sounds are normal. She exhibits no distension. There is tenderness in the right lower quadrant, suprapubic area and left lower quadrant. There is no guarding, no tenderness at McBurney's point and negative Murphy's sign.    Discomfort upon palpation to the lower abdominal region - predominantly LLQ and umbilical region   Musculoskeletal: Normal range of motion.  Lymphadenopathy:    She has no cervical adenopathy.  Neurological: She is alert and oriented to person, place, and time. She exhibits normal muscle tone. Coordination normal.  Cranial nerves III-XII grossly intact  Strength intact to upper and lower extremities Follows commands Responds to questions appropriately  Skin: Skin  is warm and dry. No rash noted. She is not diaphoretic. No erythema.  Psychiatric: She has a normal mood and affect. Her behavior is normal. Thought content normal.    ED Course  Procedures (including critical care time)  This provider reviewed patient's chart. Patient was seen numerous times in ED setting back in July and August of this year, 2014, regarding syncopal episodes. Patient was admitted to the hospital in August 2014 where left cardiac catheterization was performed and her right femoral transvenous temporary pacemaker was replaced with a dual chamber pacemaker-MDT. Patient followed by cardiology.  1:34 PM Patient had pacemaker - dual chamber MDT - PM to be interrogated.  2:30 PM Interrogated PM report given. PM interrogation with negative findings, PM functioning properly.  5:26 PM This provider spoke with Dr.  Ahmed Prima, Internal medicine physician, discussed patient's history, case, presenting symptoms, labs and imaging. Patient to be admitted to the hospital for colitis and syncope - admitted to Telemetry as inpatient.    5:43 PM this provider spoke with patient and husband regarding labs and imaging results in great detail. Discussed with patient and husband plan for admission. Patient agreed and understood. Offered morphine for the pain, patient refused.    Date: 11/26/2012  Rate: 72  Rhythm: normal sinus rhythm  QRS Axis: normal  Intervals: normal  ST/T Wave abnormalities: normal  Conduction Disutrbances:none  Narrative Interpretation: Aberrant conduction of SV complexes, dual chamber PM  Old EKG Reviewed: unchanged EKG analyzed and reviewed by this provider and attending physician.      Labs Review Labs Reviewed  CBC WITH DIFFERENTIAL - Abnormal; Notable for the following:    WBC 13.5 (*)    HCT 35.9 (*)    RDW 16.0 (*)    Neutrophils Relative % 87 (*)    Neutro Abs 11.7 (*)    Lymphocytes Relative 8 (*)    All other components within normal limits    COMPREHENSIVE METABOLIC PANEL - Abnormal; Notable for the following:    Sodium 133 (*)    Glucose, Bld 125 (*)    Alkaline Phosphatase 134 (*)    GFR calc non Af Amer 84 (*)    All other components within normal limits  URINALYSIS, ROUTINE W REFLEX MICROSCOPIC - Abnormal; Notable for the following:    Ketones, ur >80 (*)    All other components within normal limits  STOOL CULTURE  LIPASE, BLOOD  TROPONIN I  TROPONIN I  CG4 I-STAT (LACTIC ACID)   Imaging Review Dg Chest 2 View  11/26/2012   CLINICAL DATA:  Pain  EXAM: CHEST  2 VIEW  COMPARISON:  08/29/2012  FINDINGS: A pacing device is again seen. The cardiac shadow is stable. The lungs are well aerated bilaterally without focal infiltrate or sizable effusion. No acute bony abnormality is seen.  IMPRESSION: No acute abnormality noted.   Electronically Signed   By: Alcide Clever M.D.   On: 11/26/2012 15:31   Ct Abdomen Pelvis W Contrast  11/26/2012   CLINICAL DATA:  Abdominal pain  EXAM: CT ABDOMEN AND PELVIS WITH CONTRAST  TECHNIQUE: Multidetector CT imaging of the abdomen and pelvis was performed using the standard protocol following bolus administration of intravenous contrast.  CONTRAST:  OMNIPAQUE IOHEXOL 300 MG/ML  SOLN  COMPARISON:  08/26/2012  FINDINGS: The lung bases are free of acute infiltrate or sizable effusion.  The gallbladder is been surgically removed. Mild biliary ductal dilatation is noted but stable from the previous exam. The spleen, adrenal glands, kidneys and pancreas are stable in appearance from the previous exam. The  Diffuse diverticular change of the colon is noted without definitive diverticulitis. There is bowel wall edema identified from the level of the splenic flexure extending into the rectosigmoid region. Pericolonic inflammatory changes are noted as well. These changes are new from the prior exam and would suggest a degree of ischemia although the central vasculature of the bowel appears within normal  limits. These changes could be related to inflammatory change is well. The appendix is not well visualized. No inflammatory changes are seen. No pelvic mass or abnormality is noted.  IMPRESSION: New bowel wall edema and pericolonic inflammatory changes seen in the descending colon towards the rectosigmoid region. These changes suggest ischemic etiology although an infectious etiology would deserve consideration as well.  Electronically Signed   By: Alcide Clever M.D.   On: 11/26/2012 16:59    EKG Interpretation   None       MDM   1. Colitis   2. Syncope   3. Diarrhea    Filed Vitals:   11/27/12 0506  BP: 145/53  Pulse: 84  Temp:   Resp:     Patient presenting to the ED with sudden onset abdominal pain that started at approximately 8:30AM this morning. Patient reported that the discomfort was localized to her lower abdomen described as a sharp pain with radiation to the back. Reported that she had associated symptoms of nausea, emesis x 3, diarrhea. Patient reported that she has 3 syncopal episodes at home.  Patient was seen in the ED for syncopal episodes in 08/2012 where she was hospitalized and pacemaker was placed, MDT.  Alert and oriented. Heart rate and rhythm normal - pacemaker present. Lungs clear to auscultation. Pulses palpable and strong. BS normoactive in all quadrant - discomfort noted upon palpation to the LLQ, RLQ, and suprapubic region. Negative acute abdomen, negative peritoneal signs. Full ROM to upper and lower extremities. Patient does not appear in any form of distress. Patient had at least 3-4 loose BMs with foul odor - stool culture obtained.  EKG negative ischemic findings, no new changes. Two sets of troponin negative elevation. Pacemaker interrogated. Urine negative for infection - negative nitrites, negative leukocytes. Mild elevated WBC of 13.5, negative leukocytosis noted. Lipase negative elevation. Lactic acid negative elevation. Chest xray negative abnormalities  noted. CT abdomen and pelvis with contrast noted bowel wall edema and pericolonic inflammatory changes in the descending colon towards rectosigmoid region - suggestive of ischemic/infectious etiology. Stool cultures pending. IV fluids administered in ED setting. Blood pressure and heart rate controlled. Negative chest pain experienced in ED setting.  Patient presenting with colitis. Discussed patient's history, presentation, case, labs/imaging with Dr. Judie Petit. [place] - patient to be admitted to the hospital Telemetry as inpatient. Discussed plan for admission with patient and husband regarding abdominal pain, diarrhea, and cardiac history. Patient and husband understood and agreed to plan of admission. Patient stable for transfer.     Raymon Mutton, PA-C 11/27/12 1353

## 2012-11-27 DIAGNOSIS — I10 Essential (primary) hypertension: Secondary | ICD-10-CM | POA: Diagnosis not present

## 2012-11-27 DIAGNOSIS — R197 Diarrhea, unspecified: Secondary | ICD-10-CM | POA: Diagnosis not present

## 2012-11-27 DIAGNOSIS — D649 Anemia, unspecified: Secondary | ICD-10-CM | POA: Diagnosis not present

## 2012-11-27 DIAGNOSIS — K5289 Other specified noninfective gastroenteritis and colitis: Secondary | ICD-10-CM | POA: Diagnosis not present

## 2012-11-27 DIAGNOSIS — R112 Nausea with vomiting, unspecified: Secondary | ICD-10-CM | POA: Diagnosis not present

## 2012-11-27 DIAGNOSIS — K559 Vascular disorder of intestine, unspecified: Secondary | ICD-10-CM | POA: Diagnosis not present

## 2012-11-27 LAB — CBC
HCT: 33.6 % — ABNORMAL LOW (ref 36.0–46.0)
Hemoglobin: 11.5 g/dL — ABNORMAL LOW (ref 12.0–15.0)
MCV: 88.4 fL (ref 78.0–100.0)
RBC: 3.8 MIL/uL — ABNORMAL LOW (ref 3.87–5.11)
WBC: 12.7 10*3/uL — ABNORMAL HIGH (ref 4.0–10.5)

## 2012-11-27 LAB — BASIC METABOLIC PANEL
CO2: 21 mEq/L (ref 19–32)
Chloride: 104 mEq/L (ref 96–112)
Creatinine, Ser: 0.57 mg/dL (ref 0.50–1.10)
GFR calc Af Amer: >90 mL/min (ref >90)
Potassium: 3.4 mEq/L — ABNORMAL LOW (ref 3.5–5.1)
Sodium: 135 mEq/L (ref 135–145)

## 2012-11-27 LAB — TSH: TSH: 0.905 u[IU]/mL (ref 0.350–4.500)

## 2012-11-27 MED ORDER — SODIUM CHLORIDE 0.45 % IV SOLN
INTRAVENOUS | Status: DC
  Administered 2012-11-27 – 2012-11-30 (×3): via INTRAVENOUS
  Filled 2012-11-27 (×6): qty 1000

## 2012-11-27 NOTE — Progress Notes (Signed)
Subjective: Tamara [name] is feeling much better today. Hydration and antibiotic seem to be helping. Sudden onset of pain suggests more of an ischemic cause of colitis than infectious.  Objective: Weight change:   Intake/Output Summary (Last 24 hours) at 11/27/12 1925 Last data filed at 11/27/12 1600  Gross per 24 hour  Intake    835 ml  Output   1000 ml  Net   -165 ml   Filed Vitals:   11/27/12 0500 11/27/12 0503 11/27/12 0506 11/27/12 1350  BP: 166/85 105/52 145/53 166/55  Pulse: 62 66 84 79  Temp: 98.5 F (36.9 C)   99.3 F (37.4 C)  TempSrc:    Oral  Resp: 18   17  SpO2: 98%   99%    General Appearance: Alert, cooperative, no distress, appears stated age Lungs: Clear to auscultation bilaterally, respirations unlabored Heart: Regular rate and rhythm, S1 and S2 normal, no murmur, rub or gallop Abdomen: Soft, tender in lower quadrants left greater than right, no masses, no organomegaly Extremities: Extremities normal, atraumatic, no cyanosis or edema Neuro: alert and oriented, nonfocal  Lab Results: Results for orders placed during the hospital encounter of 11/26/12 (from the past 48 hour(s))  CBC WITH DIFFERENTIAL     Status: Abnormal   Collection Time    11/26/12  1:52 PM      Result Value Range   WBC 13.5 (*) 4.0 - 10.5 K/uL   RBC 4.04  3.87 - 5.11 MIL/uL   Hemoglobin 12.5  12.0 - 15.0 g/dL   HCT 40.9 (*) 81.1 - 91.4 %   MCV 88.9  78.0 - 100.0 fL   MCH 30.9  26.0 - 34.0 pg   MCHC 34.8  30.0 - 36.0 g/dL   RDW 78.2 (*) 95.6 - 21.3 %   Platelets 177  150 - 400 K/uL   Neutrophils Relative % 87 (*) 43 - 77 %   Neutro Abs 11.7 (*) 1.7 - 7.7 K/uL   Lymphocytes Relative 8 (*) 12 - 46 %   Lymphs Abs 1.1  0.7 - 4.0 K/uL   Monocytes Relative 5  3 - 12 %   Monocytes Absolute 0.7  0.1 - 1.0 K/uL   Eosinophils Relative 0  0 - 5 %   Eosinophils Absolute 0.0  0.0 - 0.7 K/uL   Basophils Relative 0  0 - 1 %   Basophils Absolute 0.0  0.0 - 0.1 K/uL  COMPREHENSIVE METABOLIC  PANEL     Status: Abnormal   Collection Time    11/26/12  1:52 PM      Result Value Range   Sodium 133 (*) 135 - 145 mEq/L   Potassium 3.9  3.5 - 5.1 mEq/L   Chloride 99  96 - 112 mEq/L   CO2 22  19 - 32 mEq/L   Glucose, Bld 125 (*) 70 - 99 mg/dL   BUN 16  6 - 23 mg/dL   Creatinine, Ser 0.86  0.50 - 1.10 mg/dL   Calcium 9.9  8.4 - 57.8 mg/dL   Total Protein 7.4  6.0 - 8.3 g/dL   Albumin 3.9  3.5 - 5.2 g/dL   AST 32  0 - 37 U/L   ALT 24  0 - 35 U/L   Alkaline Phosphatase 134 (*) 39 - 117 U/L   Total Bilirubin 0.3  0.3 - 1.2 mg/dL   GFR calc non Af Amer 84 (*) >90 mL/min   GFR calc Af Amer >90  >90 mL/min  Comment: (NOTE)     The eGFR has been calculated using the CKD EPI equation.     This calculation has not been validated in all clinical situations.     eGFR's persistently <90 mL/min signify possible Chronic Kidney     Disease.  LIPASE, BLOOD     Status: None   Collection Time    11/26/12  1:52 PM      Result Value Range   Lipase 31  11 - 59 U/L  TROPONIN I     Status: None   Collection Time    11/26/12  1:52 PM      Result Value Range   Troponin I <0.30  <0.30 ng/mL   Comment:            Due to the release kinetics of cTnI,     a negative result within the first hours     of the onset of symptoms does not rule out     myocardial infarction with certainty.     If myocardial infarction is still suspected,     repeat the test at appropriate intervals.  CG4 I-STAT (LACTIC ACID)     Status: None   Collection Time    11/26/12  2:08 PM      Result Value Range   Lactic Acid, Venous 1.81  0.5 - 2.2 mmol/L  URINALYSIS, ROUTINE W REFLEX MICROSCOPIC     Status: Abnormal   Collection Time    11/26/12  4:01 PM      Result Value Range   Color, Urine YELLOW  YELLOW   APPearance CLEAR  CLEAR   Specific Gravity, Urine 1.021  1.005 - 1.030   pH 5.5  5.0 - 8.0   Glucose, UA NEGATIVE  NEGATIVE mg/dL   Hgb urine dipstick NEGATIVE  NEGATIVE   Bilirubin Urine NEGATIVE  NEGATIVE    Ketones, ur >80 (*) NEGATIVE mg/dL   Protein, ur NEGATIVE  NEGATIVE mg/dL   Urobilinogen, UA 0.2  0.0 - 1.0 mg/dL   Nitrite NEGATIVE  NEGATIVE   Leukocytes, UA NEGATIVE  NEGATIVE   Comment: MICROSCOPIC NOT DONE ON URINES WITH NEGATIVE PROTEIN, BLOOD, LEUKOCYTES, NITRITE, OR GLUCOSE <1000 mg/dL.  TROPONIN I     Status: None   Collection Time    11/26/12  6:13 PM      Result Value Range   Troponin I <0.30  <0.30 ng/mL   Comment:            Due to the release kinetics of cTnI,     a negative result within the first hours     of the onset of symptoms does not rule out     myocardial infarction with certainty.     If myocardial infarction is still suspected,     repeat the test at appropriate intervals.  BASIC METABOLIC PANEL     Status: Abnormal   Collection Time    11/27/12  6:15 AM      Result Value Range   Sodium 135  135 - 145 mEq/L   Potassium 3.4 (*) 3.5 - 5.1 mEq/L   Chloride 104  96 - 112 mEq/L   CO2 21  19 - 32 mEq/L   Glucose, Bld 116 (*) 70 - 99 mg/dL   BUN 9  6 - 23 mg/dL   Creatinine, Ser 4.54  0.50 - 1.10 mg/dL   Calcium 8.7  8.4 - 09.8 mg/dL   GFR calc non Af Amer 85 (*) >90 mL/min  GFR calc Af Amer >90  >90 mL/min   Comment: (NOTE)     The eGFR has been calculated using the CKD EPI equation.     This calculation has not been validated in all clinical situations.     eGFR's persistently <90 mL/min signify possible Chronic Kidney     Disease.  CBC     Status: Abnormal   Collection Time    11/27/12  6:15 AM      Result Value Range   WBC 12.7 (*) 4.0 - 10.5 K/uL   RBC 3.80 (*) 3.87 - 5.11 MIL/uL   Hemoglobin 11.5 (*) 12.0 - 15.0 g/dL   HCT 98.1 (*) 19.1 - 47.8 %   MCV 88.4  78.0 - 100.0 fL   MCH 30.3  26.0 - 34.0 pg   MCHC 34.2  30.0 - 36.0 g/dL   RDW 29.5 (*) 62.1 - 30.8 %   Platelets 176  150 - 400 K/uL  TSH     Status: None   Collection Time    11/27/12  6:15 AM      Result Value Range   TSH 0.905  0.350 - 4.500 uIU/mL   Comment: Performed at [facility]    Studies/Results: Dg Chest 2 View  11/26/2012   CLINICAL DATA:  Pain  EXAM: CHEST  2 VIEW  COMPARISON:  08/29/2012  FINDINGS: A pacing device is again seen. The cardiac shadow is stable. The lungs are well aerated bilaterally without focal infiltrate or sizable effusion. No acute bony abnormality is seen.  IMPRESSION: No acute abnormality noted.   Electronically Signed   By: Alcide Clever M.D.   On: 11/26/2012 15:31   Ct Abdomen Pelvis W Contrast  11/26/2012   CLINICAL DATA:  Abdominal pain  EXAM: CT ABDOMEN AND PELVIS WITH CONTRAST  TECHNIQUE: Multidetector CT imaging of the abdomen and pelvis was performed using the standard protocol following bolus administration of intravenous contrast.  CONTRAST:  OMNIPAQUE IOHEXOL 300 MG/ML  SOLN  COMPARISON:  08/26/2012  FINDINGS: The lung bases are free of acute infiltrate or sizable effusion.  The gallbladder is been surgically removed. Mild biliary ductal dilatation is noted but stable from the previous exam. The spleen, adrenal glands, kidneys and pancreas are stable in appearance from the previous exam. The  Diffuse diverticular change of the colon is noted without definitive diverticulitis. There is bowel wall edema identified from the level of the splenic flexure extending into the rectosigmoid region. Pericolonic inflammatory changes are noted as well. These changes are new from the prior exam and would suggest a degree of ischemia although the central vasculature of the bowel appears within normal limits. These changes could be related to inflammatory change is well. The appendix is not well visualized. No inflammatory changes are seen. No pelvic mass or abnormality is noted.  IMPRESSION: New bowel wall edema and pericolonic inflammatory changes seen in the descending colon towards the rectosigmoid region. These changes suggest ischemic etiology although an infectious etiology would deserve consideration as well.   Electronically Signed    By: Alcide Clever M.D.   On: 11/26/2012 16:59   Medications: Scheduled Meds: . ciprofloxacin  400 mg Intravenous Q12H  . metronidazole  500 mg Intravenous Q8H  . sodium chloride  3 mL Intravenous Q12H   Continuous Infusions: . sodium chloride 0.45 % with kcl     PRN Meds:.acetaminophen, acetaminophen, HYDROmorphone (DILAUDID) injection, ondansetron (ZOFRAN) IV, ondansetron  Assessment/Plan: Principal Problem:   Colitis -  suspect ischemic colitis with sudden onset with no prodromal symptoms - continue IV hydration and antibiotics and check CBC in a.m. Appreciate GI consultation Active Problems:   Sinus arrest - history of   Syncope - history   Pacemaker implanted 08/28/12   Diastolic dysfunction - asymptomatic   Nausea and vomiting - improved with bowel rest with clear liquids and IV hydration and antibiotic therapy   LLQ abdominal pain - improved   Essential hypertension, benign - follow closely  Spoke with patient this a.m. during examination and with patient's husband by telephone. They are understanding current intentions for treatment   LOS: 1 day   Pearla Dubonnet, MD 11/27/2012, 7:25 PM

## 2012-11-27 NOTE — Consult Note (Signed)
[facility] Consult Note  Referring Provider: No ref. provider found Primary Care Physician:  No primary provider on file. Primary Gastroenterologist:  Dr.  Antony Contras Complaint: Abdominal pain HPI: Tamara [name] is an 79 y.o. white female  who presents with a one-day history of severe lower abdominal pain worse on the left with onset of diarrhea that became bloody. She came to the emergency room and had segmental colitis involving the splenic flexure to rectosigmoid on CT scan consistent with ischemic colitis. Her last colonoscopy was done electively 7 years ago.  Past Medical History  Diagnosis Date  . Breast cancer   . HTN (hypertension) August 2014  . Syncope and collapse August 2014    sinus pauses/ PTVDP    Past Surgical History  Procedure Laterality Date  . Breast lumpectomy    . Cholecystectomy    . Tonsillectomy    . Pacemaker insertion  08/28/12    MDT    Medications Prior to Admission  Medication Sig Dispense Refill  . acetaminophen (TYLENOL) 325 MG tablet Take 2 tablets (650 mg total) by mouth every 4 (four) hours as needed.      . B Complex-C (SUPER B COMPLEX PO) Take by mouth every other day.      . Cholecalciferol (VITAMIN D) 2000 UNITS tablet Take 2,000 Units by mouth daily with breakfast.      . Cyanocobalamin (VITAMIN B-12 PO) Take by mouth every other day.      Marland Kitchen GLUCOSAMINE-CHONDROITIN PO Take 1 capsule by mouth daily with breakfast.      . lisinopril (PRINIVIL,ZESTRIL) 10 MG tablet Take 1 tablet (10 mg total) by mouth daily.  90 tablet  3  . Multiple Vitamin (MULTIVITAMIN WITH MINERALS) TABS Take 1 tablet by mouth daily with breakfast.      . Multiple Vitamins-Minerals (ANTIOXIDANT FORMULA SG) capsule Take 1 capsule by mouth daily.      . Omega-3 Fatty Acids (OMEGA 3 PO) Take 1 capsule by mouth daily with breakfast.      . Probiotic Product (ALIGN PO) Take 1 capsule by mouth daily with breakfast.        Allergies:  Allergies  Allergen Reactions   . Codeine Nausea And Vomiting    Family History  Problem Relation Age of Onset  . Alzheimer's disease Mother   . Renal Disease Father   . Coronary artery disease Brother     Social History:  reports that she has never smoked. She does not have any smokeless tobacco history on file. She reports that she does not drink alcohol or use illicit drugs.  Review of Systems: negative except as above   Blood pressure 145/53, pulse 84, temperature 98.5 F (36.9 C), temperature source Oral, resp. rate 18, SpO2 98.00%. Head: Normocephalic, without obvious abnormality, atraumatic Neck: no adenopathy, no carotid bruit, no JVD, supple, symmetrical, trachea midline and thyroid not enlarged, symmetric, no tenderness/mass/nodules Resp: clear to auscultation bilaterally Cardio: regular rate and rhythm, S1, S2 normal, no murmur, click, rub or gallop GI: Abdomen soft tender in the left lower quadrant with slight rebound Extremities: extremities normal, atraumatic, no cyanosis or edema  Results for orders placed during the hospital encounter of 11/26/12 (from the past 48 hour(s))  CBC WITH DIFFERENTIAL     Status: Abnormal   Collection Time    11/26/12  1:52 PM      Result Value Range   WBC 13.5 (*) 4.0 - 10.5 K/uL   RBC 4.04  3.87 - 5.11 MIL/uL  Hemoglobin 12.5  12.0 - 15.0 g/dL   HCT 16.1 (*) 09.6 - 04.5 %   MCV 88.9  78.0 - 100.0 fL   MCH 30.9  26.0 - 34.0 pg   MCHC 34.8  30.0 - 36.0 g/dL   RDW 40.9 (*) 81.1 - 91.4 %   Platelets 177  150 - 400 K/uL   Neutrophils Relative % 87 (*) 43 - 77 %   Neutro Abs 11.7 (*) 1.7 - 7.7 K/uL   Lymphocytes Relative 8 (*) 12 - 46 %   Lymphs Abs 1.1  0.7 - 4.0 K/uL   Monocytes Relative 5  3 - 12 %   Monocytes Absolute 0.7  0.1 - 1.0 K/uL   Eosinophils Relative 0  0 - 5 %   Eosinophils Absolute 0.0  0.0 - 0.7 K/uL   Basophils Relative 0  0 - 1 %   Basophils Absolute 0.0  0.0 - 0.1 K/uL  COMPREHENSIVE METABOLIC PANEL     Status: Abnormal   Collection Time     11/26/12  1:52 PM      Result Value Range   Sodium 133 (*) 135 - 145 mEq/L   Potassium 3.9  3.5 - 5.1 mEq/L   Chloride 99  96 - 112 mEq/L   CO2 22  19 - 32 mEq/L   Glucose, Bld 125 (*) 70 - 99 mg/dL   BUN 16  6 - 23 mg/dL   Creatinine, Ser 7.82  0.50 - 1.10 mg/dL   Calcium 9.9  8.4 - 95.6 mg/dL   Total Protein 7.4  6.0 - 8.3 g/dL   Albumin 3.9  3.5 - 5.2 g/dL   AST 32  0 - 37 U/L   ALT 24  0 - 35 U/L   Alkaline Phosphatase 134 (*) 39 - 117 U/L   Total Bilirubin 0.3  0.3 - 1.2 mg/dL   GFR calc non Af Amer 84 (*) >90 mL/min   GFR calc Af Amer >90  >90 mL/min   Comment: (NOTE)     The eGFR has been calculated using the CKD EPI equation.     This calculation has not been validated in all clinical situations.     eGFR's persistently <90 mL/min signify possible Chronic Kidney     Disease.  LIPASE, BLOOD     Status: None   Collection Time    11/26/12  1:52 PM      Result Value Range   Lipase 31  11 - 59 U/L  TROPONIN I     Status: None   Collection Time    11/26/12  1:52 PM      Result Value Range   Troponin I <0.30  <0.30 ng/mL   Comment:            Due to the release kinetics of cTnI,     a negative result within the first hours     of the onset of symptoms does not rule out     myocardial infarction with certainty.     If myocardial infarction is still suspected,     repeat the test at appropriate intervals.  CG4 I-STAT (LACTIC ACID)     Status: None   Collection Time    11/26/12  2:08 PM      Result Value Range   Lactic Acid, Venous 1.81  0.5 - 2.2 mmol/L  URINALYSIS, ROUTINE W REFLEX MICROSCOPIC     Status: Abnormal   Collection Time    11/26/12  4:01 PM      Result Value Range   Color, Urine YELLOW  YELLOW   APPearance CLEAR  CLEAR   Specific Gravity, Urine 1.021  1.005 - 1.030   pH 5.5  5.0 - 8.0   Glucose, UA NEGATIVE  NEGATIVE mg/dL   Hgb urine dipstick NEGATIVE  NEGATIVE   Bilirubin Urine NEGATIVE  NEGATIVE   Ketones, ur >80 (*) NEGATIVE mg/dL    Protein, ur NEGATIVE  NEGATIVE mg/dL   Urobilinogen, UA 0.2  0.0 - 1.0 mg/dL   Nitrite NEGATIVE  NEGATIVE   Leukocytes, UA NEGATIVE  NEGATIVE   Comment: MICROSCOPIC NOT DONE ON URINES WITH NEGATIVE PROTEIN, BLOOD, LEUKOCYTES, NITRITE, OR GLUCOSE <1000 mg/dL.  TROPONIN I     Status: None   Collection Time    11/26/12  6:13 PM      Result Value Range   Troponin I <0.30  <0.30 ng/mL   Comment:            Due to the release kinetics of cTnI,     a negative result within the first hours     of the onset of symptoms does not rule out     myocardial infarction with certainty.     If myocardial infarction is still suspected,     repeat the test at appropriate intervals.  BASIC METABOLIC PANEL     Status: Abnormal   Collection Time    11/27/12  6:15 AM      Result Value Range   Sodium 135  135 - 145 mEq/L   Potassium 3.4 (*) 3.5 - 5.1 mEq/L   Chloride 104  96 - 112 mEq/L   CO2 21  19 - 32 mEq/L   Glucose, Bld 116 (*) 70 - 99 mg/dL   BUN 9  6 - 23 mg/dL   Creatinine, Ser 7.82  0.50 - 1.10 mg/dL   Calcium 8.7  8.4 - 95.6 mg/dL   GFR calc non Af Amer 85 (*) >90 mL/min   GFR calc Af Amer >90  >90 mL/min   Comment: (NOTE)     The eGFR has been calculated using the CKD EPI equation.     This calculation has not been validated in all clinical situations.     eGFR's persistently <90 mL/min signify possible Chronic Kidney     Disease.  CBC     Status: Abnormal   Collection Time    11/27/12  6:15 AM      Result Value Range   WBC 12.7 (*) 4.0 - 10.5 K/uL   RBC 3.80 (*) 3.87 - 5.11 MIL/uL   Hemoglobin 11.5 (*) 12.0 - 15.0 g/dL   HCT 21.3 (*) 08.6 - 57.8 %   MCV 88.4  78.0 - 100.0 fL   MCH 30.3  26.0 - 34.0 pg   MCHC 34.2  30.0 - 36.0 g/dL   RDW 46.9 (*) 62.9 - 52.8 %   Platelets 176  150 - 400 K/uL   Dg Chest 2 View  11/26/2012   CLINICAL DATA:  Pain  EXAM: CHEST  2 VIEW  COMPARISON:  08/29/2012  FINDINGS: A pacing device is again seen. The cardiac shadow is stable. The lungs are well  aerated bilaterally without focal infiltrate or sizable effusion. No acute bony abnormality is seen.  IMPRESSION: No acute abnormality noted.   Electronically Signed   By: Alcide Clever M.D.   On: 11/26/2012 15:31   Ct Abdomen Pelvis W Contrast  11/26/2012  CLINICAL DATA:  Abdominal pain  EXAM: CT ABDOMEN AND PELVIS WITH CONTRAST  TECHNIQUE: Multidetector CT imaging of the abdomen and pelvis was performed using the standard protocol following bolus administration of intravenous contrast.  CONTRAST:  OMNIPAQUE IOHEXOL 300 MG/ML  SOLN  COMPARISON:  08/26/2012  FINDINGS: The lung bases are free of acute infiltrate or sizable effusion.  The gallbladder is been surgically removed. Mild biliary ductal dilatation is noted but stable from the previous exam. The spleen, adrenal glands, kidneys and pancreas are stable in appearance from the previous exam. The  Diffuse diverticular change of the colon is noted without definitive diverticulitis. There is bowel wall edema identified from the level of the splenic flexure extending into the rectosigmoid region. Pericolonic inflammatory changes are noted as well. These changes are new from the prior exam and would suggest a degree of ischemia although the central vasculature of the bowel appears within normal limits. These changes could be related to inflammatory change is well. The appendix is not well visualized. No inflammatory changes are seen. No pelvic mass or abnormality is noted.  IMPRESSION: New bowel wall edema and pericolonic inflammatory changes seen in the descending colon towards the rectosigmoid region. These changes suggest ischemic etiology although an infectious etiology would deserve consideration as well.   Electronically Signed   By: Alcide Clever M.D.   On: 11/26/2012 16:59    Assessment: Probable ischemic colitis Plan:  Agree with antibiotics and bowel rest. Will monitor clinically. At some point we'll probably need colonoscopy. Lola Czerwonka  C 11/27/2012, 9:13 AM

## 2012-11-27 NOTE — Progress Notes (Signed)
L [place] notified pt having small amounts of frank blood per rectum.  Pt denies any further abd pain or discomfort at this time. No orders received at this time.  Pt already has CBC ordered for AM. Dierdre Highman, RN

## 2012-11-28 DIAGNOSIS — R197 Diarrhea, unspecified: Secondary | ICD-10-CM | POA: Diagnosis not present

## 2012-11-28 DIAGNOSIS — K559 Vascular disorder of intestine, unspecified: Secondary | ICD-10-CM | POA: Diagnosis not present

## 2012-11-28 DIAGNOSIS — R112 Nausea with vomiting, unspecified: Secondary | ICD-10-CM | POA: Diagnosis not present

## 2012-11-28 DIAGNOSIS — D649 Anemia, unspecified: Secondary | ICD-10-CM | POA: Diagnosis not present

## 2012-11-28 DIAGNOSIS — K5289 Other specified noninfective gastroenteritis and colitis: Secondary | ICD-10-CM | POA: Diagnosis not present

## 2012-11-28 DIAGNOSIS — I10 Essential (primary) hypertension: Secondary | ICD-10-CM | POA: Diagnosis not present

## 2012-11-28 LAB — CBC
HCT: 30.7 % — ABNORMAL LOW (ref 36.0–46.0)
Hemoglobin: 10.8 g/dL — ABNORMAL LOW (ref 12.0–15.0)
MCH: 31.1 pg (ref 26.0–34.0)
MCV: 88.5 fL (ref 78.0–100.0)
RBC: 3.47 MIL/uL — ABNORMAL LOW (ref 3.87–5.11)

## 2012-11-28 LAB — BASIC METABOLIC PANEL
CO2: 24 mEq/L (ref 19–32)
Calcium: 8.9 mg/dL (ref 8.4–10.5)
Creatinine, Ser: 0.56 mg/dL (ref 0.50–1.10)
Glucose, Bld: 106 mg/dL — ABNORMAL HIGH (ref 70–99)

## 2012-11-28 MED ORDER — SACCHAROMYCES BOULARDII 250 MG PO CAPS
250.0000 mg | ORAL_CAPSULE | Freq: Two times a day (BID) | ORAL | Status: DC
  Administered 2012-11-28 – 2012-11-30 (×5): 250 mg via ORAL
  Filled 2012-11-28 (×6): qty 1

## 2012-11-28 NOTE — Progress Notes (Signed)
Subjective: Tamara [name] is feeling better today.  Continue with hydration and antibiotic therapy.  Reports she may have had a small amount of blood per rectum yesterday.  Objective: Weight change:   Intake/Output Summary (Last 24 hours) at 11/28/12 0635 Last data filed at 11/28/12 0541  Gross per 24 hour  Intake    835 ml  Output    800 ml  Net     35 ml   Filed Vitals:   11/28/12 0536 11/28/12 0537 11/28/12 0539 11/28/12 0542  BP: 170/70 154/67 136/58 153/68  Pulse: 83 84 91 87  Temp: 98.1 F (36.7 C)     TempSrc: Oral     Resp: 18 18 18 18   SpO2: 96% 98% 98% 99%    General Appearance: Alert, cooperative, no distress, appears stated age  Lungs: Clear to auscultation bilaterally, respirations unlabored  Heart: Regular rate and rhythm, S1 and S2 normal, no murmur, rub or gallop  Abdomen: Soft, mildly tender in lower quadrants left greater than right but less reported tenderness than yesterday, no masses, no organomegaly  Extremities: Extremities normal, atraumatic, no cyanosis or edema  Neuro: alert and oriented, nonfocal   Lab Results: Results for orders placed during the hospital encounter of 11/26/12 (from the past 48 hour(s))  CBC WITH DIFFERENTIAL     Status: Abnormal   Collection Time    11/26/12  1:52 PM      Result Value Range   WBC 13.5 (*) 4.0 - 10.5 K/uL   RBC 4.04  3.87 - 5.11 MIL/uL   Hemoglobin 12.5  12.0 - 15.0 g/dL   HCT 40.9 (*) 81.1 - 91.4 %   MCV 88.9  78.0 - 100.0 fL   MCH 30.9  26.0 - 34.0 pg   MCHC 34.8  30.0 - 36.0 g/dL   RDW 78.2 (*) 95.6 - 21.3 %   Platelets 177  150 - 400 K/uL   Neutrophils Relative % 87 (*) 43 - 77 %   Neutro Abs 11.7 (*) 1.7 - 7.7 K/uL   Lymphocytes Relative 8 (*) 12 - 46 %   Lymphs Abs 1.1  0.7 - 4.0 K/uL   Monocytes Relative 5  3 - 12 %   Monocytes Absolute 0.7  0.1 - 1.0 K/uL   Eosinophils Relative 0  0 - 5 %   Eosinophils Absolute 0.0  0.0 - 0.7 K/uL   Basophils Relative 0  0 - 1 %   Basophils Absolute 0.0  0.0 -  0.1 K/uL  COMPREHENSIVE METABOLIC PANEL     Status: Abnormal   Collection Time    11/26/12  1:52 PM      Result Value Range   Sodium 133 (*) 135 - 145 mEq/L   Potassium 3.9  3.5 - 5.1 mEq/L   Chloride 99  96 - 112 mEq/L   CO2 22  19 - 32 mEq/L   Glucose, Bld 125 (*) 70 - 99 mg/dL   BUN 16  6 - 23 mg/dL   Creatinine, Ser 0.86  0.50 - 1.10 mg/dL   Calcium 9.9  8.4 - 57.8 mg/dL   Total Protein 7.4  6.0 - 8.3 g/dL   Albumin 3.9  3.5 - 5.2 g/dL   AST 32  0 - 37 U/L   ALT 24  0 - 35 U/L   Alkaline Phosphatase 134 (*) 39 - 117 U/L   Total Bilirubin 0.3  0.3 - 1.2 mg/dL   GFR calc non Af Amer 84 (*) >  90 mL/min   GFR calc Af Amer >90  >90 mL/min   Comment: (NOTE)     The eGFR has been calculated using the CKD EPI equation.     This calculation has not been validated in all clinical situations.     eGFR's persistently <90 mL/min signify possible Chronic Kidney     Disease.  LIPASE, BLOOD     Status: None   Collection Time    11/26/12  1:52 PM      Result Value Range   Lipase 31  11 - 59 U/L  TROPONIN I     Status: None   Collection Time    11/26/12  1:52 PM      Result Value Range   Troponin I <0.30  <0.30 ng/mL   Comment:            Due to the release kinetics of cTnI,     a negative result within the first hours     of the onset of symptoms does not rule out     myocardial infarction with certainty.     If myocardial infarction is still suspected,     repeat the test at appropriate intervals.  CG4 I-STAT (LACTIC ACID)     Status: None   Collection Time    11/26/12  2:08 PM      Result Value Range   Lactic Acid, Venous 1.81  0.5 - 2.2 mmol/L  URINALYSIS, ROUTINE W REFLEX MICROSCOPIC     Status: Abnormal   Collection Time    11/26/12  4:01 PM      Result Value Range   Color, Urine YELLOW  YELLOW   APPearance CLEAR  CLEAR   Specific Gravity, Urine 1.021  1.005 - 1.030   pH 5.5  5.0 - 8.0   Glucose, UA NEGATIVE  NEGATIVE mg/dL   Hgb urine dipstick NEGATIVE  NEGATIVE    Bilirubin Urine NEGATIVE  NEGATIVE   Ketones, ur >80 (*) NEGATIVE mg/dL   Protein, ur NEGATIVE  NEGATIVE mg/dL   Urobilinogen, UA 0.2  0.0 - 1.0 mg/dL   Nitrite NEGATIVE  NEGATIVE   Leukocytes, UA NEGATIVE  NEGATIVE   Comment: MICROSCOPIC NOT DONE ON URINES WITH NEGATIVE PROTEIN, BLOOD, LEUKOCYTES, NITRITE, OR GLUCOSE <1000 mg/dL.  TROPONIN I     Status: None   Collection Time    11/26/12  6:13 PM      Result Value Range   Troponin I <0.30  <0.30 ng/mL   Comment:            Due to the release kinetics of cTnI,     a negative result within the first hours     of the onset of symptoms does not rule out     myocardial infarction with certainty.     If myocardial infarction is still suspected,     repeat the test at appropriate intervals.  BASIC METABOLIC PANEL     Status: Abnormal   Collection Time    11/27/12  6:15 AM      Result Value Range   Sodium 135  135 - 145 mEq/L   Potassium 3.4 (*) 3.5 - 5.1 mEq/L   Chloride 104  96 - 112 mEq/L   CO2 21  19 - 32 mEq/L   Glucose, Bld 116 (*) 70 - 99 mg/dL   BUN 9  6 - 23 mg/dL   Creatinine, Ser 1.61  0.50 - 1.10 mg/dL   Calcium 8.7  8.4 -  10.5 mg/dL   GFR calc non Af Amer 85 (*) >90 mL/min   GFR calc Af Amer >90  >90 mL/min   Comment: (NOTE)     The eGFR has been calculated using the CKD EPI equation.     This calculation has not been validated in all clinical situations.     eGFR's persistently <90 mL/min signify possible Chronic Kidney     Disease.  CBC     Status: Abnormal   Collection Time    11/27/12  6:15 AM      Result Value Range   WBC 12.7 (*) 4.0 - 10.5 K/uL   RBC 3.80 (*) 3.87 - 5.11 MIL/uL   Hemoglobin 11.5 (*) 12.0 - 15.0 g/dL   HCT 40.9 (*) 81.1 - 91.4 %   MCV 88.4  78.0 - 100.0 fL   MCH 30.3  26.0 - 34.0 pg   MCHC 34.2  30.0 - 36.0 g/dL   RDW 78.2 (*) 95.6 - 21.3 %   Platelets 176  150 - 400 K/uL  TSH     Status: None   Collection Time    11/27/12  6:15 AM      Result Value Range   TSH 0.905  0.350 - 4.500  uIU/mL   Comment: Performed at [facility]    Studies/Results: Dg Chest 2 View  11/26/2012   CLINICAL DATA:  Pain  EXAM: CHEST  2 VIEW  COMPARISON:  08/29/2012  FINDINGS: A pacing device is again seen. The cardiac shadow is stable. The lungs are well aerated bilaterally without focal infiltrate or sizable effusion. No acute bony abnormality is seen.  IMPRESSION: No acute abnormality noted.   Electronically Signed   By: Alcide Clever M.D.   On: 11/26/2012 15:31   Ct Abdomen Pelvis W Contrast  11/26/2012   CLINICAL DATA:  Abdominal pain  EXAM: CT ABDOMEN AND PELVIS WITH CONTRAST  TECHNIQUE: Multidetector CT imaging of the abdomen and pelvis was performed using the standard protocol following bolus administration of intravenous contrast.  CONTRAST:  OMNIPAQUE IOHEXOL 300 MG/ML  SOLN  COMPARISON:  08/26/2012  FINDINGS: The lung bases are free of acute infiltrate or sizable effusion.  The gallbladder is been surgically removed. Mild biliary ductal dilatation is noted but stable from the previous exam. The spleen, adrenal glands, kidneys and pancreas are stable in appearance from the previous exam. The  Diffuse diverticular change of the colon is noted without definitive diverticulitis. There is bowel wall edema identified from the level of the splenic flexure extending into the rectosigmoid region. Pericolonic inflammatory changes are noted as well. These changes are new from the prior exam and would suggest a degree of ischemia although the central vasculature of the bowel appears within normal limits. These changes could be related to inflammatory change is well. The appendix is not well visualized. No inflammatory changes are seen. No pelvic mass or abnormality is noted.  IMPRESSION: New bowel wall edema and pericolonic inflammatory changes seen in the descending colon towards the rectosigmoid region. These changes suggest ischemic etiology although an infectious etiology would deserve  consideration as well.   Electronically Signed   By: Alcide Clever M.D.   On: 11/26/2012 16:59   Medications: Scheduled Meds: . ciprofloxacin  400 mg Intravenous Q12H  . metronidazole  500 mg Intravenous Q8H  . sodium chloride  3 mL Intravenous Q12H   Continuous Infusions: . sodium chloride 0.45 % with kcl 75 mL/hr at 11/27/12 2056  PRN Meds:.acetaminophen, acetaminophen, HYDROmorphone (DILAUDID) injection, ondansetron (ZOFRAN) IV, ondansetron  Assessment/Plan: Principal Problem:   Colitis - suspect ischemic colitis still but will continue antibiotics for now.  We'll also add a probiotic.  Continue IV hydration.  Advance diet to full liquids and ambulate.  Appreciate help from Dr. Madilyn Fireman Active Problems:  Sinus arrest - history of  Syncope - history  Pacemaker implanted 08/28/12  Diastolic dysfunction - asymptomatic  Nausea and vomiting - improved with bowel rest with clear liquids and IV hydration and antibiotic therapy - change to full liquid diet LLQ abdominal pain - improved  Essential hypertension, benign - follow closely   Spoke with patient this a.m. during examination and with patient's husband by telephone. They are understanding current intentions for treatment.  Disposition:  Ambulate today and increase diet - hopeful discharge in the next 2-3 days     LOS: 2 days   Pearla Dubonnet, MD 11/28/2012, 6:35 AM

## 2012-11-28 NOTE — Progress Notes (Signed)
[facility] Progress Note  Subjective: The patient noted significant improvement in her abdominal pain tolerating full liquid diet  Objective: Vital signs in last 24 hours: Temp:  [98.1 F (36.7 C)-99.3 F (37.4 C)] 98.1 F (36.7 C) (11/04 0536) Pulse Rate:  [79-91] 87 (11/04 0542) Resp:  [17-18] 18 (11/04 0542) BP: (136-186)/(55-78) 153/68 mmHg (11/04 0542) SpO2:  [96 %-99 %] 99 % (11/04 0542) Weight change:    PE: Significantly less tender  Lab Results: Results for orders placed during the hospital encounter of 11/26/12 (from the past 24 hour(s))  CBC     Status: Abnormal   Collection Time    11/28/12  5:30 AM      Result Value Range   WBC 11.0 (*) 4.0 - 10.5 K/uL   RBC 3.47 (*) 3.87 - 5.11 MIL/uL   Hemoglobin 10.8 (*) 12.0 - 15.0 g/dL   HCT 16.1 (*) 09.6 - 04.5 %   MCV 88.5  78.0 - 100.0 fL   MCH 31.1  26.0 - 34.0 pg   MCHC 35.2  30.0 - 36.0 g/dL   RDW 40.9 (*) 81.1 - 91.4 %   Platelets 162  150 - 400 K/uL  BASIC METABOLIC PANEL     Status: Abnormal   Collection Time    11/28/12  5:30 AM      Result Value Range   Sodium 136  135 - 145 mEq/L   Potassium 3.6  3.5 - 5.1 mEq/L   Chloride 105  96 - 112 mEq/L   CO2 24  19 - 32 mEq/L   Glucose, Bld 106 (*) 70 - 99 mg/dL   BUN 5 (*) 6 - 23 mg/dL   Creatinine, Ser 7.82  0.50 - 1.10 mg/dL   Calcium 8.9  8.4 - 95.6 mg/dL   GFR calc non Af Amer 85 (*) >90 mL/min   GFR calc Af Amer >90  >90 mL/min    Studies/Results: Dg Chest 2 View  11/26/2012   CLINICAL DATA:  Pain  EXAM: CHEST  2 VIEW  COMPARISON:  08/29/2012  FINDINGS: A pacing device is again seen. The cardiac shadow is stable. The lungs are well aerated bilaterally without focal infiltrate or sizable effusion. No acute bony abnormality is seen.  IMPRESSION: No acute abnormality noted.   Electronically Signed   By: Alcide Clever M.D.   On: 11/26/2012 15:31   Ct Abdomen Pelvis W Contrast  11/26/2012   CLINICAL DATA:  Abdominal pain  EXAM: CT ABDOMEN AND  PELVIS WITH CONTRAST  TECHNIQUE: Multidetector CT imaging of the abdomen and pelvis was performed using the standard protocol following bolus administration of intravenous contrast.  CONTRAST:  OMNIPAQUE IOHEXOL 300 MG/ML  SOLN  COMPARISON:  08/26/2012  FINDINGS: The lung bases are free of acute infiltrate or sizable effusion.  The gallbladder is been surgically removed. Mild biliary ductal dilatation is noted but stable from the previous exam. The spleen, adrenal glands, kidneys and pancreas are stable in appearance from the previous exam. The  Diffuse diverticular change of the colon is noted without definitive diverticulitis. There is bowel wall edema identified from the level of the splenic flexure extending into the rectosigmoid region. Pericolonic inflammatory changes are noted as well. These changes are new from the prior exam and would suggest a degree of ischemia although the central vasculature of the bowel appears within normal limits. These changes could be related to inflammatory change is well. The appendix is not well visualized. No inflammatory changes are  seen. No pelvic mass or abnormality is noted.  IMPRESSION: New bowel wall edema and pericolonic inflammatory changes seen in the descending colon towards the rectosigmoid region. These changes suggest ischemic etiology although an infectious etiology would deserve consideration as well.   Electronically Signed   By: Alcide Clever M.D.   On: 11/26/2012 16:59      Assessment: Probable ischemic colitis of the left colon, clinically seems to be improving  Plan: Antibiotics for a few more days, can be switched by mouth. Gradually advance diet. Would defer colonoscopy acutely    Artemis Loyal C 11/28/2012, 7:25 AM

## 2012-11-29 DIAGNOSIS — D649 Anemia, unspecified: Secondary | ICD-10-CM | POA: Diagnosis not present

## 2012-11-29 DIAGNOSIS — I10 Essential (primary) hypertension: Secondary | ICD-10-CM | POA: Diagnosis not present

## 2012-11-29 DIAGNOSIS — R112 Nausea with vomiting, unspecified: Secondary | ICD-10-CM | POA: Diagnosis not present

## 2012-11-29 DIAGNOSIS — R197 Diarrhea, unspecified: Secondary | ICD-10-CM | POA: Diagnosis not present

## 2012-11-29 DIAGNOSIS — K559 Vascular disorder of intestine, unspecified: Secondary | ICD-10-CM | POA: Diagnosis not present

## 2012-11-29 LAB — BASIC METABOLIC PANEL
BUN: 4 mg/dL — ABNORMAL LOW (ref 6–23)
CO2: 22 mEq/L (ref 19–32)
Calcium: 8.8 mg/dL (ref 8.4–10.5)
Creatinine, Ser: 0.57 mg/dL (ref 0.50–1.10)
GFR calc Af Amer: >90 mL/min (ref >90)

## 2012-11-29 LAB — CBC
HCT: 29.1 % — ABNORMAL LOW (ref 36.0–46.0)
MCH: 30.6 pg (ref 26.0–34.0)
MCV: 89.8 fL (ref 78.0–100.0)
Platelets: 153 10*3/uL (ref 150–400)
RDW: 16.6 % — ABNORMAL HIGH (ref 11.5–15.5)

## 2012-11-29 MED ORDER — METRONIDAZOLE 500 MG PO TABS
500.0000 mg | ORAL_TABLET | Freq: Two times a day (BID) | ORAL | Status: DC
  Administered 2012-11-29 – 2012-11-30 (×3): 500 mg via ORAL
  Filled 2012-11-29 (×5): qty 1

## 2012-11-29 MED ORDER — CIPROFLOXACIN HCL 500 MG PO TABS
500.0000 mg | ORAL_TABLET | Freq: Two times a day (BID) | ORAL | Status: DC
  Administered 2012-11-29 – 2012-11-30 (×3): 500 mg via ORAL
  Filled 2012-11-29 (×5): qty 1

## 2012-11-29 MED ORDER — BOOST PLUS PO LIQD
237.0000 mL | Freq: Three times a day (TID) | ORAL | Status: DC
  Administered 2012-11-29 – 2012-11-30 (×2): 237 mL via ORAL
  Filled 2012-11-29 (×12): qty 237

## 2012-11-29 NOTE — Progress Notes (Signed)
Subjective: Patient slowly feeling better.  Did have some loose stools last night.  May have been because she had some milk products.  Was able to ambulate less today but still feels quite weak.  Left lower quadrant pain improving  Objective: Weight change:   Intake/Output Summary (Last 24 hours) at 11/29/12 0630 Last data filed at 11/28/12 0759  Gross per 24 hour  Intake      0 ml  Output    450 ml  Net   -450 ml   Filed Vitals:   11/28/12 0539 11/28/12 0542 11/28/12 2145 11/28/12 2230  BP: 136/58 153/68 192/76 164/88  Pulse: 91 87 87   Temp:   98 F (36.7 C)   TempSrc:   Axillary   Resp: 18 18    SpO2: 98% 99% 98%     General Appearance: Alert, cooperative, no distress, appears stated age  Lungs: Clear to auscultation bilaterally, respirations unlabored  Heart: Regular rate and rhythm, S1 and S2 normal, no murmur, rub or gallop  Abdomen: Soft, mildly tender in lower quadrants left greater than right but less tenderness than yesterday, no masses, no organomegaly  Extremities: Extremities normal, atraumatic, no cyanosis or edema  Neuro: alert and oriented, nonfocal   Lab Results: Results for orders placed during the hospital encounter of 11/26/12 (from the past 48 hour(s))  CBC     Status: Abnormal   Collection Time    11/28/12  5:30 AM      Result Value Range   WBC 11.0 (*) 4.0 - 10.5 K/uL   RBC 3.47 (*) 3.87 - 5.11 MIL/uL   Hemoglobin 10.8 (*) 12.0 - 15.0 g/dL   HCT 16.1 (*) 09.6 - 04.5 %   MCV 88.5  78.0 - 100.0 fL   MCH 31.1  26.0 - 34.0 pg   MCHC 35.2  30.0 - 36.0 g/dL   RDW 40.9 (*) 81.1 - 91.4 %   Platelets 162  150 - 400 K/uL  BASIC METABOLIC PANEL     Status: Abnormal   Collection Time    11/28/12  5:30 AM      Result Value Range   Sodium 136  135 - 145 mEq/L   Potassium 3.6  3.5 - 5.1 mEq/L   Chloride 105  96 - 112 mEq/L   CO2 24  19 - 32 mEq/L   Glucose, Bld 106 (*) 70 - 99 mg/dL   BUN 5 (*) 6 - 23 mg/dL   Creatinine, Ser 7.82  0.50 - 1.10 mg/dL   Calcium 8.9  8.4 - 95.6 mg/dL   GFR calc non Af Amer 85 (*) >90 mL/min   GFR calc Af Amer >90  >90 mL/min   Comment: (NOTE)     The eGFR has been calculated using the CKD EPI equation.     This calculation has not been validated in all clinical situations.     eGFR's persistently <90 mL/min signify possible Chronic Kidney     Disease.  CBC     Status: Abnormal   Collection Time    11/29/12  3:50 AM      Result Value Range   WBC 10.2  4.0 - 10.5 K/uL   RBC 3.24 (*) 3.87 - 5.11 MIL/uL   Hemoglobin 9.9 (*) 12.0 - 15.0 g/dL   HCT 21.3 (*) 08.6 - 57.8 %   MCV 89.8  78.0 - 100.0 fL   MCH 30.6  26.0 - 34.0 pg   MCHC 34.0  30.0 -  36.0 g/dL   RDW 16.1 (*) 09.6 - 04.5 %   Platelets 153  150 - 400 K/uL  BASIC METABOLIC PANEL     Status: Abnormal   Collection Time    11/29/12  3:50 AM      Result Value Range   Sodium 135  135 - 145 mEq/L   Potassium 3.7  3.5 - 5.1 mEq/L   Chloride 106  96 - 112 mEq/L   CO2 22  19 - 32 mEq/L   Glucose, Bld 99  70 - 99 mg/dL   BUN 4 (*) 6 - 23 mg/dL   Creatinine, Ser 4.09  0.50 - 1.10 mg/dL   Calcium 8.8  8.4 - 81.1 mg/dL   GFR calc non Af Amer 85 (*) >90 mL/min   GFR calc Af Amer >90  >90 mL/min   Comment: (NOTE)     The eGFR has been calculated using the CKD EPI equation.     This calculation has not been validated in all clinical situations.     eGFR's persistently <90 mL/min signify possible Chronic Kidney     Disease.    Studies/Results: No results found. Medications: Scheduled Meds: . ciprofloxacin  500 mg Oral BID  . lactose free nutrition  237 mL Oral TID WC  . metroNIDAZOLE  500 mg Oral Q12H  . saccharomyces boulardii  250 mg Oral BID  . sodium chloride  3 mL Intravenous Q12H   Continuous Infusions: . sodium chloride 0.45 % with kcl 75 mL/hr at 11/27/12 2056   PRN Meds:.acetaminophen, acetaminophen, HYDROmorphone (DILAUDID) injection, ondansetron (ZOFRAN) IV, ondansetron  Assessment/Plan:  Principal Problem:  Colitis - suspect  ischemic colitis but will continue antibiotics orally for now.  Continue Florastor. Continue IV hydration. Advance diet to bland and continue to ambulate. Appreciate help from Dr. Madilyn Fireman.  Probable colonoscopy in several weeks Active Problems:  Sinus arrest - history of  Syncope - history  Pacemaker implanted 08/28/12  Diastolic dysfunction - asymptomatic  Nausea and vomiting - improved with bowel rest with clear liquids and IV hydration and antibiotic therapy - change to bland diet LLQ abdominal pain - improved  Essential hypertension, benign - follow closely  Spoke with patient this a.m. during examination and with patient's husband by telephone. They are understanding current intentions for treatment.  Disposition: Ambulate today and progress diet - hopeful discharge in a.m.    LOS: 3 days   Pearla Dubonnet, MD 11/29/2012, 6:30 AM

## 2012-11-30 DIAGNOSIS — K5289 Other specified noninfective gastroenteritis and colitis: Secondary | ICD-10-CM | POA: Diagnosis not present

## 2012-11-30 DIAGNOSIS — R5381 Other malaise: Secondary | ICD-10-CM | POA: Diagnosis not present

## 2012-11-30 LAB — STOOL CULTURE

## 2012-11-30 LAB — BASIC METABOLIC PANEL
BUN: 6 mg/dL (ref 6–23)
Chloride: 105 mEq/L (ref 96–112)
GFR calc Af Amer: >90 mL/min (ref >90)
GFR calc non Af Amer: 83 mL/min — ABNORMAL LOW (ref >90)
Glucose, Bld: 90 mg/dL (ref 70–99)
Potassium: 4.2 mEq/L (ref 3.5–5.1)

## 2012-11-30 LAB — CBC
MCHC: 33.6 g/dL (ref 30.0–36.0)
MCV: 90.1 fL (ref 78.0–100.0)
Platelets: 173 10*3/uL (ref 150–400)
RBC: 3.24 MIL/uL — ABNORMAL LOW (ref 3.87–5.11)
RDW: 17 % — ABNORMAL HIGH (ref 11.5–15.5)
WBC: 6.5 10*3/uL (ref 4.0–10.5)

## 2012-11-30 MED ORDER — CIPROFLOXACIN HCL 500 MG PO TABS: 500.0000 mg | ORAL_TABLET | Freq: Two times a day (BID) | ORAL | Status: AC

## 2012-11-30 MED ORDER — METRONIDAZOLE 500 MG PO TABS: 500.0000 mg | ORAL_TABLET | Freq: Two times a day (BID) | ORAL | Status: DC

## 2012-11-30 NOTE — Discharge Summary (Signed)
Physician Discharge Summary  NAME:Tamara [name]  ZOX:[id number]  DOB: 1929/10/18   Admit date: 11/26/2012 Discharge date: 11/30/2012  Discharge Diagnoses:   Principal Problem:  Colitis - likely secondary to ischemia but will continue antibiotics orally for 4 more days as an outpatient.  Resume Align.  Continue bland diet for several days and continue to ambulate at home. Active Problems:  Sinus arrest - history of  Syncope - history  Pacemaker implanted 08/28/12  Diastolic dysfunction - asymptomatic  Nausea and vomiting - resolved with treatment of colitis Essential hypertension, benign - controlled    Disposition: Discharge home today and followup early next week in the office   Discharge Physical Exam:  General Appearance: Alert, cooperative, no distress, appears stated age  Weight change:   Intake/Output Summary (Last 24 hours) at 11/30/12 0743 Last data filed at 11/29/12 2003  Gross per 24 hour  Intake    120 ml  Output    700 ml  Net   -580 ml   Filed Vitals:   11/28/12 2230 11/29/12 0644 11/29/12 2052 11/30/12 0514  BP: 164/88 166/69 169/61 148/63  Pulse:  77 76 69  Temp:  97.9 F (36.6 C) 98.8 F (37.1 C) 98.3 F (36.8 C)  TempSrc:  Oral Oral Oral  Resp:   18 18  SpO2:  99% 98% 95%    General Appearance: Alert, cooperative, no distress, appears stated age  Lungs: Clear to auscultation bilaterally, respirations unlabored  Heart: Regular rate and rhythm, S1 and S2 normal, no murmur, rub or gallop  Abdomen: Soft, mildly tender in lower quadrants left greater than right but less tenderness than yesterday, no masses, no organomegaly  Extremities: Extremities normal, atraumatic, no cyanosis or edema  Neuro: alert and oriented, nonfocal   Discharge Condition: Much improved  Hospital Course: Mrs. Ajee Heasley is a very pleasant 79 year old female with history of sick sinus syndrome and status post permanent pacemaker placement in August of this year.  She also  has history of hypertension and breast cancer in remission.  Patient developed left lower quadrant pain suddenly that was quite severe on 11/26/2012.  CT scan in the emergency room revealed focal colitis in the descending colon to the rectum.  The abrupt onset of the colitis was more assistant with ischemic colitis but she has responded well simply to IV therapy with hydration and antibiotics.  Condition in the hospital has improved since admission and she is ambulating and eating.  Plan will be to discharge home on Cipro and Flagyl for 4 days and follow up in the office next week.  She will continue a bland diet, lactose-free for the next several days and then progress.  She may ultimately need a followup colonoscopy with  Things to follow up in the outpatient setting: Abdominal pain and/or rectal bleeding  Consults: Treatment Team:  Barrie Folk, MD - [facility]  Disposition: 01-Home or Self Care  Discharge Orders   Future Appointments Provider Department Dept Phone   12/12/2012 10:30 AM Rachael Fee [facility] [place] [phone]   12/12/2012 11:00 AM Josph Macho, MD [facility] [place] [phone]   Future Orders Complete By Expires   Call MD for:  difficulty breathing, headache or visual disturbances  As directed    Call MD for:  extreme fatigue  As directed    Call MD for:  persistant nausea and vomiting  As directed    Call MD for:  severe uncontrolled pain  As directed    Call MD for:  temperature >100.4  As directed    Diet - low sodium heart healthy  As directed    Discharge instructions  As directed    Comments:     Stick to a non-milk products bland diet for several days and then advance as tolerated.  Notify M.D. if you're redeveloping rectal bleeding or abdominal pain or if you aren't starting to feel better on a daily basis.   Increase activity slowly  As directed        Medication List         acetaminophen 325 MG tablet   Commonly known as:  TYLENOL  Take 2 tablets (650 mg total) by mouth every 4 (four) hours as needed.     ALIGN PO  Take 1 capsule by mouth daily with breakfast.     antioxidant formula SG capsule  Take 1 capsule by mouth daily.     ciprofloxacin 500 MG tablet  Commonly known as:  CIPRO  Take 1 tablet (500 mg total) by mouth 2 (two) times daily.     GLUCOSAMINE-CHONDROITIN PO  Take 1 capsule by mouth daily with breakfast.     lisinopril 10 MG tablet  Commonly known as:  PRINIVIL,ZESTRIL  Take 1 tablet (10 mg total) by mouth daily.     metroNIDAZOLE 500 MG tablet  Commonly known as:  FLAGYL  Take 1 tablet (500 mg total) by mouth every 12 (twelve) hours.     multivitamin with minerals Tabs tablet  Take 1 tablet by mouth daily with breakfast.     OMEGA 3 PO  Take 1 capsule by mouth daily with breakfast.     SUPER B COMPLEX PO  Take by mouth every other day.     VITAMIN B-12 PO  Take by mouth every other day.     Vitamin D 2000 UNITS tablet  Take 2,000 Units by mouth daily with breakfast.         The results of significant diagnostics from this hospitalization (including imaging, microbiology, ancillary and laboratory) are listed below for reference.    Significant Diagnostic Studies: Dg Chest 2 View  11/26/2012   CLINICAL DATA:  Pain  EXAM: CHEST  2 VIEW  COMPARISON:  08/29/2012  FINDINGS: A pacing device is again seen. The cardiac shadow is stable. The lungs are well aerated bilaterally without focal infiltrate or sizable effusion. No acute bony abnormality is seen.  IMPRESSION: No acute abnormality noted.   Electronically Signed   By: Alcide Clever M.D.   On: 11/26/2012 15:31   Ct Abdomen Pelvis W Contrast  11/26/2012   CLINICAL DATA:  Abdominal pain  EXAM: CT ABDOMEN AND PELVIS WITH CONTRAST  TECHNIQUE: Multidetector CT imaging of the abdomen and pelvis was performed using the standard protocol following bolus administration of intravenous contrast.  CONTRAST:   OMNIPAQUE IOHEXOL 300 MG/ML  SOLN  COMPARISON:  08/26/2012  FINDINGS: The lung bases are free of acute infiltrate or sizable effusion.  The gallbladder is been surgically removed. Mild biliary ductal dilatation is noted but stable from the previous exam. The spleen, adrenal glands, kidneys and pancreas are stable in appearance from the previous exam. The  Diffuse diverticular change of the colon is noted without definitive diverticulitis. There is bowel wall edema identified from the level of the splenic flexure extending into the rectosigmoid region. Pericolonic inflammatory changes are noted as well. These changes are new from the prior  exam and would suggest a degree of ischemia although the central vasculature of the bowel appears within normal limits. These changes could be related to inflammatory change is well. The appendix is not well visualized. No inflammatory changes are seen. No pelvic mass or abnormality is noted.  IMPRESSION: New bowel wall edema and pericolonic inflammatory changes seen in the descending colon towards the rectosigmoid region. These changes suggest ischemic etiology although an infectious etiology would deserve consideration as well.   Electronically Signed   By: Alcide Clever M.D.   On: 11/26/2012 16:59    Microbiology: Recent Results (from the past 240 hour(s))  STOOL CULTURE     Status: None   Collection Time    11/26/12  4:05 PM      Result Value Range Status   Specimen Description STOMA   Final   Special Requests NONE   Final   Culture     Final   Value: NO SUSPICIOUS COLONIES, CONTINUING TO HOLD     Performed at [facility]   Report Status PENDING   Incomplete     Labs: Results for orders placed during the hospital encounter of 11/26/12  STOOL CULTURE      Result Value Range   Specimen Description STOMA     Special Requests NONE     Culture       Value: NO SUSPICIOUS COLONIES, CONTINUING TO HOLD     Performed at [facility]   Report  Status PENDING    CBC WITH DIFFERENTIAL      Result Value Range   WBC 13.5 (*) 4.0 - 10.5 K/uL   RBC 4.04  3.87 - 5.11 MIL/uL   Hemoglobin 12.5  12.0 - 15.0 g/dL   HCT 30.8 (*) 65.7 - 84.6 %   MCV 88.9  78.0 - 100.0 fL   MCH 30.9  26.0 - 34.0 pg   MCHC 34.8  30.0 - 36.0 g/dL   RDW 96.2 (*) 95.2 - 84.1 %   Platelets 177  150 - 400 K/uL   Neutrophils Relative % 87 (*) 43 - 77 %   Neutro Abs 11.7 (*) 1.7 - 7.7 K/uL   Lymphocytes Relative 8 (*) 12 - 46 %   Lymphs Abs 1.1  0.7 - 4.0 K/uL   Monocytes Relative 5  3 - 12 %   Monocytes Absolute 0.7  0.1 - 1.0 K/uL   Eosinophils Relative 0  0 - 5 %   Eosinophils Absolute 0.0  0.0 - 0.7 K/uL   Basophils Relative 0  0 - 1 %   Basophils Absolute 0.0  0.0 - 0.1 K/uL  COMPREHENSIVE METABOLIC PANEL      Result Value Range   Sodium 133 (*) 135 - 145 mEq/L   Potassium 3.9  3.5 - 5.1 mEq/L   Chloride 99  96 - 112 mEq/L   CO2 22  19 - 32 mEq/L   Glucose, Bld 125 (*) 70 - 99 mg/dL   BUN 16  6 - 23 mg/dL   Creatinine, Ser 3.24  0.50 - 1.10 mg/dL   Calcium 9.9  8.4 - 40.1 mg/dL   Total Protein 7.4  6.0 - 8.3 g/dL   Albumin 3.9  3.5 - 5.2 g/dL   AST 32  0 - 37 U/L   ALT 24  0 - 35 U/L   Alkaline Phosphatase 134 (*) 39 - 117 U/L   Total Bilirubin 0.3  0.3 - 1.2 mg/dL   GFR calc  non Af Amer 84 (*) >90 mL/min   GFR calc Af Amer >90  >90 mL/min  LIPASE, BLOOD      Result Value Range   Lipase 31  11 - 59 U/L  URINALYSIS, ROUTINE W REFLEX MICROSCOPIC      Result Value Range   Color, Urine YELLOW  YELLOW   APPearance CLEAR  CLEAR   Specific Gravity, Urine 1.021  1.005 - 1.030   pH 5.5  5.0 - 8.0   Glucose, UA NEGATIVE  NEGATIVE mg/dL   Hgb urine dipstick NEGATIVE  NEGATIVE   Bilirubin Urine NEGATIVE  NEGATIVE   Ketones, ur >80 (*) NEGATIVE mg/dL   Protein, ur NEGATIVE  NEGATIVE mg/dL   Urobilinogen, UA 0.2  0.0 - 1.0 mg/dL   Nitrite NEGATIVE  NEGATIVE   Leukocytes, UA NEGATIVE  NEGATIVE  TROPONIN I      Result Value Range   Troponin I  <0.30  <0.30 ng/mL  TROPONIN I      Result Value Range   Troponin I <0.30  <0.30 ng/mL  BASIC METABOLIC PANEL      Result Value Range   Sodium 135  135 - 145 mEq/L   Potassium 3.4 (*) 3.5 - 5.1 mEq/L   Chloride 104  96 - 112 mEq/L   CO2 21  19 - 32 mEq/L   Glucose, Bld 116 (*) 70 - 99 mg/dL   BUN 9  6 - 23 mg/dL   Creatinine, Ser 1.61  0.50 - 1.10 mg/dL   Calcium 8.7  8.4 - 09.6 mg/dL   GFR calc non Af Amer 85 (*) >90 mL/min   GFR calc Af Amer >90  >90 mL/min  CBC      Result Value Range   WBC 12.7 (*) 4.0 - 10.5 K/uL   RBC 3.80 (*) 3.87 - 5.11 MIL/uL   Hemoglobin 11.5 (*) 12.0 - 15.0 g/dL   HCT 04.5 (*) 40.9 - 81.1 %   MCV 88.4  78.0 - 100.0 fL   MCH 30.3  26.0 - 34.0 pg   MCHC 34.2  30.0 - 36.0 g/dL   RDW 91.4 (*) 78.2 - 95.6 %   Platelets 176  150 - 400 K/uL  TSH      Result Value Range   TSH 0.905  0.350 - 4.500 uIU/mL  CBC      Result Value Range   WBC 11.0 (*) 4.0 - 10.5 K/uL   RBC 3.47 (*) 3.87 - 5.11 MIL/uL   Hemoglobin 10.8 (*) 12.0 - 15.0 g/dL   HCT 21.3 (*) 08.6 - 57.8 %   MCV 88.5  78.0 - 100.0 fL   MCH 31.1  26.0 - 34.0 pg   MCHC 35.2  30.0 - 36.0 g/dL   RDW 46.9 (*) 62.9 - 52.8 %   Platelets 162  150 - 400 K/uL  BASIC METABOLIC PANEL      Result Value Range   Sodium 136  135 - 145 mEq/L   Potassium 3.6  3.5 - 5.1 mEq/L   Chloride 105  96 - 112 mEq/L   CO2 24  19 - 32 mEq/L   Glucose, Bld 106 (*) 70 - 99 mg/dL   BUN 5 (*) 6 - 23 mg/dL   Creatinine, Ser 4.13  0.50 - 1.10 mg/dL   Calcium 8.9  8.4 - 24.4 mg/dL   GFR calc non Af Amer 85 (*) >90 mL/min   GFR calc Af Amer >90  >90 mL/min  CBC  Result Value Range   WBC 10.2  4.0 - 10.5 K/uL   RBC 3.24 (*) 3.87 - 5.11 MIL/uL   Hemoglobin 9.9 (*) 12.0 - 15.0 g/dL   HCT 40.9 (*) 81.1 - 91.4 %   MCV 89.8  78.0 - 100.0 fL   MCH 30.6  26.0 - 34.0 pg   MCHC 34.0  30.0 - 36.0 g/dL   RDW 78.2 (*) 95.6 - 21.3 %   Platelets 153  150 - 400 K/uL  BASIC METABOLIC PANEL      Result Value Range   Sodium 135   135 - 145 mEq/L   Potassium 3.7  3.5 - 5.1 mEq/L   Chloride 106  96 - 112 mEq/L   CO2 22  19 - 32 mEq/L   Glucose, Bld 99  70 - 99 mg/dL   BUN 4 (*) 6 - 23 mg/dL   Creatinine, Ser 0.86  0.50 - 1.10 mg/dL   Calcium 8.8  8.4 - 57.8 mg/dL   GFR calc non Af Amer 85 (*) >90 mL/min   GFR calc Af Amer >90  >90 mL/min  CBC      Result Value Range   WBC 6.5  4.0 - 10.5 K/uL   RBC 3.24 (*) 3.87 - 5.11 MIL/uL   Hemoglobin 9.8 (*) 12.0 - 15.0 g/dL   HCT 46.9 (*) 62.9 - 52.8 %   MCV 90.1  78.0 - 100.0 fL   MCH 30.2  26.0 - 34.0 pg   MCHC 33.6  30.0 - 36.0 g/dL   RDW 41.3 (*) 24.4 - 01.0 %   Platelets 173  150 - 400 K/uL  BASIC METABOLIC PANEL      Result Value Range   Sodium 137  135 - 145 mEq/L   Potassium 4.2  3.5 - 5.1 mEq/L   Chloride 105  96 - 112 mEq/L   CO2 24  19 - 32 mEq/L   Glucose, Bld 90  70 - 99 mg/dL   BUN 6  6 - 23 mg/dL   Creatinine, Ser 2.72  0.50 - 1.10 mg/dL   Calcium 9.3  8.4 - 53.6 mg/dL   GFR calc non Af Amer 83 (*) >90 mL/min   GFR calc Af Amer >90  >90 mL/min  CG4 I-STAT (LACTIC ACID)      Result Value Range   Lactic Acid, Venous 1.81  0.5 - 2.2 mmol/L    Time coordinating discharge: 32 minutes  Signed: Pearla Dubonnet, MD 11/30/2012, 7:43 AM

## 2012-11-30 NOTE — Progress Notes (Signed)
I, Dawsen Krieger (RN), cosign for Tamara [name]'s (student nurse) assessment, med administration, I&O, care plan, education, and notes. 

## 2012-12-04 ENCOUNTER — Encounter (HOSPITAL_COMMUNITY): Payer: Self-pay | Admitting: Emergency Medicine

## 2012-12-04 DIAGNOSIS — R933 Abnormal findings on diagnostic imaging of other parts of digestive tract: Secondary | ICD-10-CM | POA: Diagnosis not present

## 2012-12-04 DIAGNOSIS — Y9210 Unspecified residential institution as the place of occurrence of the external cause: Secondary | ICD-10-CM | POA: Diagnosis not present

## 2012-12-04 DIAGNOSIS — E46 Unspecified protein-calorie malnutrition: Secondary | ICD-10-CM | POA: Diagnosis present

## 2012-12-04 DIAGNOSIS — E871 Hypo-osmolality and hyponatremia: Secondary | ICD-10-CM | POA: Diagnosis not present

## 2012-12-04 DIAGNOSIS — R030 Elevated blood-pressure reading, without diagnosis of hypertension: Secondary | ICD-10-CM | POA: Diagnosis not present

## 2012-12-04 DIAGNOSIS — K929 Disease of digestive system, unspecified: Secondary | ICD-10-CM | POA: Diagnosis not present

## 2012-12-04 DIAGNOSIS — R197 Diarrhea, unspecified: Secondary | ICD-10-CM | POA: Diagnosis not present

## 2012-12-04 DIAGNOSIS — R4181 Age-related cognitive decline: Secondary | ICD-10-CM | POA: Diagnosis present

## 2012-12-04 DIAGNOSIS — D62 Acute posthemorrhagic anemia: Secondary | ICD-10-CM | POA: Diagnosis present

## 2012-12-04 DIAGNOSIS — C801 Malignant (primary) neoplasm, unspecified: Secondary | ICD-10-CM | POA: Diagnosis not present

## 2012-12-04 DIAGNOSIS — K6389 Other specified diseases of intestine: Secondary | ICD-10-CM | POA: Diagnosis not present

## 2012-12-04 DIAGNOSIS — M6281 Muscle weakness (generalized): Secondary | ICD-10-CM | POA: Diagnosis not present

## 2012-12-04 DIAGNOSIS — K560 Paralytic ileus: Secondary | ICD-10-CM | POA: Diagnosis not present

## 2012-12-04 DIAGNOSIS — R109 Unspecified abdominal pain: Secondary | ICD-10-CM | POA: Diagnosis not present

## 2012-12-04 DIAGNOSIS — J9819 Other pulmonary collapse: Secondary | ICD-10-CM | POA: Diagnosis not present

## 2012-12-04 DIAGNOSIS — K648 Other hemorrhoids: Secondary | ICD-10-CM | POA: Diagnosis present

## 2012-12-04 DIAGNOSIS — I495 Sick sinus syndrome: Secondary | ICD-10-CM | POA: Diagnosis present

## 2012-12-04 DIAGNOSIS — R141 Gas pain: Secondary | ICD-10-CM | POA: Diagnosis not present

## 2012-12-04 DIAGNOSIS — C184 Malignant neoplasm of transverse colon: Secondary | ICD-10-CM | POA: Diagnosis not present

## 2012-12-04 DIAGNOSIS — D490 Neoplasm of unspecified behavior of digestive system: Secondary | ICD-10-CM | POA: Diagnosis not present

## 2012-12-04 DIAGNOSIS — F411 Generalized anxiety disorder: Secondary | ICD-10-CM | POA: Diagnosis not present

## 2012-12-04 DIAGNOSIS — Z820 Family history of epilepsy and other diseases of the nervous system: Secondary | ICD-10-CM

## 2012-12-04 DIAGNOSIS — Z79899 Other long term (current) drug therapy: Secondary | ICD-10-CM | POA: Diagnosis not present

## 2012-12-04 DIAGNOSIS — Q628 Other congenital malformations of ureter: Secondary | ICD-10-CM | POA: Diagnosis not present

## 2012-12-04 DIAGNOSIS — K922 Gastrointestinal hemorrhage, unspecified: Secondary | ICD-10-CM | POA: Diagnosis not present

## 2012-12-04 DIAGNOSIS — Z8249 Family history of ischemic heart disease and other diseases of the circulatory system: Secondary | ICD-10-CM

## 2012-12-04 DIAGNOSIS — Z9089 Acquired absence of other organs: Secondary | ICD-10-CM | POA: Diagnosis not present

## 2012-12-04 DIAGNOSIS — C189 Malignant neoplasm of colon, unspecified: Secondary | ICD-10-CM | POA: Diagnosis not present

## 2012-12-04 DIAGNOSIS — D649 Anemia, unspecified: Secondary | ICD-10-CM | POA: Diagnosis not present

## 2012-12-04 DIAGNOSIS — N805 Endometriosis of intestine: Secondary | ICD-10-CM | POA: Diagnosis not present

## 2012-12-04 DIAGNOSIS — C50919 Malignant neoplasm of unspecified site of unspecified female breast: Secondary | ICD-10-CM | POA: Diagnosis not present

## 2012-12-04 DIAGNOSIS — F3289 Other specified depressive episodes: Secondary | ICD-10-CM | POA: Diagnosis not present

## 2012-12-04 DIAGNOSIS — Z66 Do not resuscitate: Secondary | ICD-10-CM | POA: Diagnosis not present

## 2012-12-04 DIAGNOSIS — E876 Hypokalemia: Secondary | ICD-10-CM | POA: Diagnosis not present

## 2012-12-04 DIAGNOSIS — D371 Neoplasm of uncertain behavior of stomach: Secondary | ICD-10-CM | POA: Diagnosis not present

## 2012-12-04 DIAGNOSIS — K5649 Other impaction of intestine: Secondary | ICD-10-CM | POA: Diagnosis not present

## 2012-12-04 DIAGNOSIS — F329 Major depressive disorder, single episode, unspecified: Secondary | ICD-10-CM | POA: Diagnosis not present

## 2012-12-04 DIAGNOSIS — R262 Difficulty in walking, not elsewhere classified: Secondary | ICD-10-CM | POA: Diagnosis not present

## 2012-12-04 DIAGNOSIS — I10 Essential (primary) hypertension: Secondary | ICD-10-CM | POA: Diagnosis not present

## 2012-12-04 DIAGNOSIS — Y836 Removal of other organ (partial) (total) as the cause of abnormal reaction of the patient, or of later complication, without mention of misadventure at the time of the procedure: Secondary | ICD-10-CM | POA: Diagnosis not present

## 2012-12-04 DIAGNOSIS — Z853 Personal history of malignant neoplasm of breast: Secondary | ICD-10-CM

## 2012-12-04 DIAGNOSIS — Z932 Ileostomy status: Secondary | ICD-10-CM | POA: Diagnosis not present

## 2012-12-04 DIAGNOSIS — K56609 Unspecified intestinal obstruction, unspecified as to partial versus complete obstruction: Secondary | ICD-10-CM | POA: Diagnosis not present

## 2012-12-04 DIAGNOSIS — K55059 Acute (reversible) ischemia of intestine, part and extent unspecified: Secondary | ICD-10-CM | POA: Diagnosis not present

## 2012-12-04 DIAGNOSIS — K565 Intestinal adhesions [bands], unspecified as to partial versus complete obstruction: Secondary | ICD-10-CM | POA: Diagnosis not present

## 2012-12-04 DIAGNOSIS — Z950 Presence of cardiac pacemaker: Secondary | ICD-10-CM | POA: Diagnosis not present

## 2012-12-04 DIAGNOSIS — R1312 Dysphagia, oropharyngeal phase: Secondary | ICD-10-CM | POA: Diagnosis not present

## 2012-12-04 DIAGNOSIS — K625 Hemorrhage of anus and rectum: Secondary | ICD-10-CM | POA: Diagnosis not present

## 2012-12-04 DIAGNOSIS — K559 Vascular disorder of intestine, unspecified: Secondary | ICD-10-CM | POA: Diagnosis not present

## 2012-12-04 DIAGNOSIS — I2510 Atherosclerotic heart disease of native coronary artery without angina pectoris: Secondary | ICD-10-CM | POA: Diagnosis present

## 2012-12-04 DIAGNOSIS — K5289 Other specified noninfective gastroenteritis and colitis: Secondary | ICD-10-CM | POA: Diagnosis not present

## 2012-12-04 NOTE — ED Provider Notes (Signed)
Medical screening examination/treatment/procedure(s) were performed by non-physician practitioner and as supervising physician I was immediately available for consultation/collaboration.  EKG Interpretation     Ventricular Rate:    PR Interval:    QRS Duration:   QT Interval:    QTC Calculation:   R Axis:     Text Interpretation:                Roney Marion, MD 12/04/12 [phone]

## 2012-12-04 NOTE — ED Notes (Signed)
Pt. reports dark loose stools onset yesterday and bloody diarrhea today with generalized weakness , admitted here last week diagnosed with colitis , denies abdominal pain or vomitting .

## 2012-12-05 ENCOUNTER — Inpatient Hospital Stay (HOSPITAL_COMMUNITY)
Admission: EM | Admit: 2012-12-05 | Discharge: 2013-01-01 | DRG: 330 | Disposition: A | Discharge status: Skilled Nursing Facility | Payer: Medicare Other | Attending: Internal Medicine | Admitting: Internal Medicine

## 2012-12-05 ENCOUNTER — Encounter (HOSPITAL_COMMUNITY): Payer: Self-pay | Admitting: Internal Medicine

## 2012-12-05 DIAGNOSIS — K922 Gastrointestinal hemorrhage, unspecified: Secondary | ICD-10-CM | POA: Diagnosis present

## 2012-12-05 DIAGNOSIS — K567 Ileus, unspecified: Secondary | ICD-10-CM | POA: Diagnosis not present

## 2012-12-05 DIAGNOSIS — Z950 Presence of cardiac pacemaker: Secondary | ICD-10-CM | POA: Diagnosis present

## 2012-12-05 DIAGNOSIS — R197 Diarrhea, unspecified: Secondary | ICD-10-CM | POA: Diagnosis present

## 2012-12-05 DIAGNOSIS — F411 Generalized anxiety disorder: Secondary | ICD-10-CM

## 2012-12-05 DIAGNOSIS — G8918 Other acute postprocedural pain: Secondary | ICD-10-CM

## 2012-12-05 DIAGNOSIS — K5289 Other specified noninfective gastroenteritis and colitis: Secondary | ICD-10-CM

## 2012-12-05 DIAGNOSIS — I10 Essential (primary) hypertension: Secondary | ICD-10-CM | POA: Diagnosis present

## 2012-12-05 DIAGNOSIS — K529 Noninfective gastroenteritis and colitis, unspecified: Secondary | ICD-10-CM

## 2012-12-05 DIAGNOSIS — K6389 Other specified diseases of intestine: Secondary | ICD-10-CM | POA: Diagnosis present

## 2012-12-05 HISTORY — DX: Noninfective gastroenteritis and colitis, unspecified: K52.9

## 2012-12-05 LAB — CBC WITH DIFFERENTIAL/PLATELET
Basophils Absolute: 0.1 10*3/uL (ref 0.0–0.1)
Basophils Absolute: 0.1 10*3/uL (ref 0.0–0.1)
Basophils Relative: 1 % (ref 0–1)
Basophils Relative: 2 % — ABNORMAL HIGH (ref 0–1)
Eosinophils Absolute: 0.1 10*3/uL (ref 0.0–0.7)
Eosinophils Absolute: 0.1 10*3/uL (ref 0.0–0.7)
Eosinophils Relative: 2 % (ref 0–5)
Eosinophils Relative: 2 % (ref 0–5)
HCT: 26.6 % — ABNORMAL LOW (ref 36.0–46.0)
HCT: 30.2 % — ABNORMAL LOW (ref 36.0–46.0)
Hemoglobin: 10.2 g/dL — ABNORMAL LOW (ref 12.0–15.0)
Lymphocytes Relative: 36 % (ref 12–46)
MCH: 30.5 pg (ref 26.0–34.0)
MCHC: 33.8 g/dL (ref 30.0–36.0)
MCHC: 34.2 g/dL (ref 30.0–36.0)
MCV: 89 fL (ref 78.0–100.0)
MCV: 90.4 fL (ref 78.0–100.0)
Monocytes Absolute: 0.7 10*3/uL (ref 0.1–1.0)
Monocytes Absolute: 0.9 10*3/uL (ref 0.1–1.0)
Monocytes Relative: 15 % — ABNORMAL HIGH (ref 3–12)
Neutro Abs: 2.4 10*3/uL (ref 1.7–7.7)
Neutrophils Relative %: 45 % (ref 43–77)
Neutrophils Relative %: 46 % (ref 43–77)
Platelets: 213 10*3/uL (ref 150–400)
RBC: 3.34 MIL/uL — ABNORMAL LOW (ref 3.87–5.11)
RDW: 16.8 % — ABNORMAL HIGH (ref 11.5–15.5)
RDW: 16.9 % — ABNORMAL HIGH (ref 11.5–15.5)
WBC: 6.1 10*3/uL (ref 4.0–10.5)

## 2012-12-05 LAB — COMPREHENSIVE METABOLIC PANEL
ALT: 31 U/L (ref 0–35)
ALT: 39 U/L — ABNORMAL HIGH (ref 0–35)
AST: 35 U/L (ref 0–37)
AST: 43 U/L — ABNORMAL HIGH (ref 0–37)
Albumin: 3.2 g/dL — ABNORMAL LOW (ref 3.5–5.2)
Albumin: 3.8 g/dL (ref 3.5–5.2)
BUN: 18 mg/dL (ref 6–23)
CO2: 25 mEq/L (ref 19–32)
Calcium: 9.1 mg/dL (ref 8.4–10.5)
Calcium: 9.9 mg/dL (ref 8.4–10.5)
Chloride: 99 mEq/L (ref 96–112)
Creatinine, Ser: 0.54 mg/dL (ref 0.50–1.10)
Creatinine, Ser: 0.65 mg/dL (ref 0.50–1.10)
GFR calc Af Amer: >90 mL/min (ref >90)
GFR calc non Af Amer: 81 mL/min — ABNORMAL LOW (ref >90)
GFR calc non Af Amer: 86 mL/min — ABNORMAL LOW (ref >90)
Glucose, Bld: 89 mg/dL (ref 70–99)
Potassium: 4.2 mEq/L (ref 3.5–5.1)
Sodium: 134 mEq/L — ABNORMAL LOW (ref 135–145)
Sodium: 137 mEq/L (ref 135–145)
Total Bilirubin: 0.3 mg/dL (ref 0.3–1.2)
Total Protein: 6.1 g/dL (ref 6.0–8.3)
Total Protein: 7.1 g/dL (ref 6.0–8.3)

## 2012-12-05 LAB — URINALYSIS, ROUTINE W REFLEX MICROSCOPIC
Glucose, UA: NEGATIVE mg/dL
Hgb urine dipstick: NEGATIVE
Leukocytes, UA: NEGATIVE
Protein, ur: NEGATIVE mg/dL
Specific Gravity, Urine: 1.006 (ref 1.005–1.030)
Urobilinogen, UA: 0.2 mg/dL (ref 0.0–1.0)

## 2012-12-05 LAB — TYPE AND SCREEN: Antibody Screen: NEGATIVE

## 2012-12-05 LAB — CLOSTRIDIUM DIFFICILE BY PCR: Toxigenic C. Difficile by PCR: NEGATIVE

## 2012-12-05 LAB — ABO/RH: ABO/RH(D): A POS

## 2012-12-05 LAB — OCCULT BLOOD, POC DEVICE: Fecal Occult Bld: POSITIVE — AB

## 2012-12-05 LAB — CG4 I-STAT (LACTIC ACID): Lactic Acid, Venous: 0.69 mmol/L (ref 0.5–2.2)

## 2012-12-05 MED ORDER — SODIUM CHLORIDE 0.9 % IV SOLN
INTRAVENOUS | Status: DC
  Administered 2012-12-07 – 2012-12-09 (×3): via INTRAVENOUS

## 2012-12-05 MED ORDER — ADULT MULTIVITAMIN W/MINERALS CH
1.0000 | ORAL_TABLET | Freq: Every day | ORAL | Status: DC
  Administered 2012-12-05 – 2012-12-06 (×2): 1 via ORAL
  Filled 2012-12-05 (×4): qty 1

## 2012-12-05 MED ORDER — METRONIDAZOLE IN NACL 5-0.79 MG/ML-% IV SOLN
500.0000 mg | Freq: Three times a day (TID) | INTRAVENOUS | Status: DC
  Administered 2012-12-05 – 2012-12-07 (×7): 500 mg via INTRAVENOUS
  Filled 2012-12-05 (×8): qty 100

## 2012-12-05 MED ORDER — POLYETHYLENE GLYCOL 3350 17 GM/SCOOP PO POWD
1.0000 | Freq: Once | ORAL | Status: AC
  Administered 2012-12-05: 1 via ORAL
  Filled 2012-12-05: qty 255

## 2012-12-05 MED ORDER — SODIUM CHLORIDE 0.9 % IV SOLN
INTRAVENOUS | Status: AC
  Administered 2012-12-05: 06:00:00 via INTRAVENOUS

## 2012-12-05 MED ORDER — RISAQUAD PO CAPS
1.0000 | ORAL_CAPSULE | Freq: Every day | ORAL | Status: DC
  Administered 2012-12-05: 1 via ORAL
  Filled 2012-12-05 (×3): qty 1

## 2012-12-05 MED ORDER — LISINOPRIL 10 MG PO TABS
10.0000 mg | ORAL_TABLET | Freq: Every day | ORAL | Status: DC
  Administered 2012-12-05 – 2012-12-07 (×2): 10 mg via ORAL
  Filled 2012-12-05 (×3): qty 1

## 2012-12-05 MED ORDER — ONDANSETRON HCL 4 MG PO TABS: 4.0000 mg | ORAL_TABLET | Freq: Four times a day (QID) | ORAL | Status: DC | PRN

## 2012-12-05 MED ORDER — ONDANSETRON HCL 4 MG/2ML IJ SOLN
4.0000 mg | Freq: Four times a day (QID) | INTRAMUSCULAR | Status: DC | PRN
  Administered 2012-12-07: 4 mg via INTRAVENOUS
  Filled 2012-12-05: qty 2

## 2012-12-05 MED ORDER — ACETAMINOPHEN 650 MG RE SUPP
650.0000 mg | Freq: Four times a day (QID) | RECTAL | Status: DC | PRN
  Administered 2012-12-12 (×2): 650 mg via RECTAL
  Filled 2012-12-05 (×2): qty 1

## 2012-12-05 MED ORDER — ACETAMINOPHEN 325 MG PO TABS
650.0000 mg | ORAL_TABLET | Freq: Four times a day (QID) | ORAL | Status: DC | PRN
  Administered 2012-12-10 – 2012-12-11 (×2): 650 mg via ORAL
  Filled 2012-12-05 (×2): qty 2

## 2012-12-05 MED ORDER — VITAMIN D 1000 UNITS PO TABS
2000.0000 [IU] | ORAL_TABLET | Freq: Every day | ORAL | Status: DC
  Administered 2012-12-05 – 2012-12-06 (×2): 2000 [IU] via ORAL
  Filled 2012-12-05 (×4): qty 2

## 2012-12-05 NOTE — H&P (Signed)
[facility] History and Physical  Tamara [name]:[id number] DOB: 1929/02/07 DOA: 12/05/2012  Referring physician: ER physician. PCP: No primary provider on file. Dr. Marden Noble.  Chief Complaint: Rectal bleeding.  HPI: Tamara [name] is a 79 y.o. female who was recently admitted for colitis and was discharged home on Flagyl and ciprofloxacin presented to the ER because of rectal bleeding. Patient states that since her discharge she has been having dark liquidy stools. Yesterday she had at least 4 episodes and last episode was frankly bloody. Her hemoglobin in the ER was stable when compared to her recent. Patient is hemodynamically stable. Patient states that during her last admission she had significant abdominal pain which at this time patient denies any abdominal pain fever chills nausea vomiting. Patient will be admitted for further management. Patient denies any chest pain or shortness of breath.   Review of Systems: As presented in the history of presenting illness, rest negative.  Past Medical History  Diagnosis Date  . Breast cancer   . HTN (hypertension) August 2014  . Syncope and collapse August 2014    sinus pauses/ PTVDP  . Colitis    Past Surgical History  Procedure Laterality Date  . Breast lumpectomy    . Cholecystectomy    . Tonsillectomy    . Pacemaker insertion  08/28/12    MDT   Social History:  reports that she has never smoked. She does not have any smokeless tobacco history on file. She reports that she does not drink alcohol or use illicit drugs. Where does patient live home. Can patient participate in ADLs? Yes.  Allergies  Allergen Reactions  . Codeine Nausea And Vomiting    Family History:  Family History  Problem Relation Age of Onset  . Alzheimer's disease Mother   . Renal Disease Father   . Coronary artery disease Brother       Prior to Admission medications   Medication Sig Start Date End Date Taking? Authorizing Provider   acetaminophen (TYLENOL) 325 MG tablet Take 2 tablets (650 mg total) by mouth every 4 (four) hours as needed. 08/30/12  Yes Luke K Kilroy, PA-C  B Complex-C (SUPER B COMPLEX PO) Take by mouth every other day.   Yes Historical Provider, MD  Cholecalciferol (VITAMIN D) 2000 UNITS tablet Take 2,000 Units by mouth daily with breakfast.   Yes Historical Provider, MD  Cyanocobalamin (VITAMIN B-12 PO) Take by mouth every other day.   Yes Historical Provider, MD  GLUCOSAMINE-CHONDROITIN PO Take 1 capsule by mouth daily with breakfast.   Yes Historical Provider, MD  lisinopril (PRINIVIL,ZESTRIL) 10 MG tablet Take 1 tablet (10 mg total) by mouth daily. 10/04/12  Yes Mihai Croitoru, MD  Multiple Vitamin (MULTIVITAMIN WITH MINERALS) TABS Take 1 tablet by mouth daily with breakfast.   Yes Historical Provider, MD  Multiple Vitamins-Minerals (ANTIOXIDANT FORMULA SG) capsule Take 1 capsule by mouth daily.   Yes Historical Provider, MD  Omega-3 Fatty Acids (OMEGA 3 PO) Take 1 capsule by mouth daily with breakfast.   Yes Historical Provider, MD  Probiotic Product (ALIGN PO) Take 1 capsule by mouth daily with breakfast.   Yes Historical Provider, MD  metroNIDAZOLE (FLAGYL) 500 MG tablet Take 1 tablet (500 mg total) by mouth every 12 (twelve) hours. 11/30/12   Marden Noble, MD    Physical Exam: Filed Vitals:   12/05/12 0130 12/05/12 0200 12/05/12 0230 12/05/12 0416  BP: 143/54 161/61 147/57 190/92  Pulse: 74 73 78 87  Temp:  98.3 F (36.8 C)  TempSrc:    Oral  Resp:      Height:    5\' 5"  (1.651 m)  Weight:    57.9 kg (127 lb 10.3 oz)  SpO2: 98% 100% 97% 96%     General:  Well-developed well-nourished.  Eyes: Anicteric no pallor.  ENT: No discharge from the ears eyes nose or mouth.  Neck: No mass felt.  Cardiovascular: S1-S2 heard.  Respiratory: No rhonchi or crepitations.  Abdomen: Soft nontender bowel sounds present no guarding no rigidity.  Skin: No rash.  Musculoskeletal: No  edema.  Psychiatric: Appears normal.  Neurologic: Alert awake oriented to time place person. Moves all extremities.  Labs on Admission:  Basic Metabolic Panel:  Recent Labs Lab 11/28/12 0530 11/29/12 0350 11/30/12 0526 12/04/12 2348  NA 136 135 137 134*  K 3.6 3.7 4.2 4.7  CL 105 106 105 99  CO2 24 22 24 25   GLUCOSE 106* 99 90 97  BUN 5* 4* 6 18  CREATININE 0.56 0.57 0.61 0.65  CALCIUM 8.9 8.8 9.3 9.9   Liver Function Tests:  Recent Labs Lab 12/04/12 2348  AST 43*  ALT 39*  ALKPHOS 98  BILITOT 0.3  PROT 7.1  ALBUMIN 3.8   No results found for this basename: LIPASE, AMYLASE,  in the last 168 hours No results found for this basename: AMMONIA,  in the last 168 hours CBC:  Recent Labs Lab 11/28/12 0530 11/29/12 0350 11/30/12 0526 12/04/12 2348  WBC 11.0* 10.2 6.5 6.1  NEUTROABS  --   --   --  2.8  HGB 10.8* 9.9* 9.8* 10.2*  HCT 30.7* 29.1* 29.2* 30.2*  MCV 88.5 89.8 90.1 90.4  PLT 162 153 173 249   Cardiac Enzymes: No results found for this basename: CKTOTAL, CKMB, CKMBINDEX, TROPONINI,  in the last 168 hours  BNP (last 3 results) No results found for this basename: PROBNP,  in the last 8760 hours CBG: No results found for this basename: GLUCAP,  in the last 168 hours  Radiological Exams on Admission: No results found.   Assessment/Plan Principal Problem:   GI bleed Active Problems:   Pacemaker implanted 08/28/12   Essential hypertension, benign   1. Rectal bleeding with recent admission for colitis - probably related to her colitis. Since patient also has increased bowel movements check stool for C. difficile. For now and continuing patient on Flagyl and gentle hydration. Grossly follow CBC. 2. Hypertension - continue present medications. Patient is hemodynamically stable. 3. Recent placement of pacemaker for sick sinus syndrome.    Code Status: Full code.  Family Communication: None.  Disposition Plan: Admit to inpatient under Dr.  Kevan Ny.   Aireonna Bauer N. [facility] Pager [phone].  If 7PM-7AM, please contact night-coverage [email or web address] Password [email or web address] 12/05/2012, 4:53 AM

## 2012-12-05 NOTE — Progress Notes (Signed)
Subjective: Feeling ok but still having loose bloody stools and feeling weak. H&P reviewed  Objective: Weight change:  No intake or output data in the 24 hours ending 12/05/12 0816 Filed Vitals:   12/05/12 0200 12/05/12 0230 12/05/12 0416 12/05/12 0720  BP: 161/61 147/57 190/92 138/74  Pulse: 73 78 87 81  Temp:   98.3 F (36.8 C) 97.7 F (36.5 C)  TempSrc:   Oral Oral  Resp:      Height:   5\' 5"  (1.651 m)   Weight:   57.9 kg (127 lb 10.3 oz)   SpO2: 100% 97% 96% 100%    General Appearance: Alert, cooperative, no distress, appears stated age Lungs: Clear to auscultation bilaterally, respirations unlabored Heart: Regular rate and rhythm, S1 and S2 normal, no murmur, rub or gallop Abdomen: Soft, non-tender, bowel sounds active all four quadrants, no masses, no organomegaly Extremities: Extremities normal, atraumatic, no cyanosis or edema Neuro: Alert and NF   Lab Results: Results for orders placed during the hospital encounter of 12/05/12 (from the past 48 hour(s))  CBC WITH DIFFERENTIAL     Status: Abnormal   Collection Time    12/04/12 11:48 PM      Result Value Range   WBC 6.1  4.0 - 10.5 K/uL   RBC 3.34 (*) 3.87 - 5.11 MIL/uL   Hemoglobin 10.2 (*) 12.0 - 15.0 g/dL   HCT 16.1 (*) 09.6 - 04.5 %   MCV 90.4  78.0 - 100.0 fL   MCH 30.5  26.0 - 34.0 pg   MCHC 33.8  30.0 - 36.0 g/dL   RDW 40.9 (*) 81.1 - 91.4 %   Platelets 249  150 - 400 K/uL   Neutrophils Relative % 45  43 - 77 %   Neutro Abs 2.8  1.7 - 7.7 K/uL   Lymphocytes Relative 36  12 - 46 %   Lymphs Abs 2.2  0.7 - 4.0 K/uL   Monocytes Relative 15 (*) 3 - 12 %   Monocytes Absolute 0.9  0.1 - 1.0 K/uL   Eosinophils Relative 2  0 - 5 %   Eosinophils Absolute 0.1  0.0 - 0.7 K/uL   Basophils Relative 2 (*) 0 - 1 %   Basophils Absolute 0.1  0.0 - 0.1 K/uL  COMPREHENSIVE METABOLIC PANEL     Status: Abnormal   Collection Time    12/04/12 11:48 PM      Result Value Range   Sodium 134 (*) 135 - 145 mEq/L   Potassium  4.7  3.5 - 5.1 mEq/L   Chloride 99  96 - 112 mEq/L   CO2 25  19 - 32 mEq/L   Glucose, Bld 97  70 - 99 mg/dL   BUN 18  6 - 23 mg/dL   Creatinine, Ser 7.82  0.50 - 1.10 mg/dL   Calcium 9.9  8.4 - 95.6 mg/dL   Total Protein 7.1  6.0 - 8.3 g/dL   Albumin 3.8  3.5 - 5.2 g/dL   AST 43 (*) 0 - 37 U/L   ALT 39 (*) 0 - 35 U/L   Alkaline Phosphatase 98  39 - 117 U/L   Total Bilirubin 0.3  0.3 - 1.2 mg/dL   GFR calc non Af Amer 81 (*) >90 mL/min   GFR calc Af Amer >90  >90 mL/min   Comment: (NOTE)     The eGFR has been calculated using the CKD EPI equation.     This calculation has not been  validated in all clinical situations.     eGFR's persistently <90 mL/min signify possible Chronic Kidney     Disease.  URINALYSIS, ROUTINE W REFLEX MICROSCOPIC     Status: None   Collection Time    12/05/12 12:47 AM      Result Value Range   Color, Urine YELLOW  YELLOW   APPearance CLEAR  CLEAR   Specific Gravity, Urine 1.006  1.005 - 1.030   pH 6.5  5.0 - 8.0   Glucose, UA NEGATIVE  NEGATIVE mg/dL   Hgb urine dipstick NEGATIVE  NEGATIVE   Bilirubin Urine NEGATIVE  NEGATIVE   Ketones, ur NEGATIVE  NEGATIVE mg/dL   Protein, ur NEGATIVE  NEGATIVE mg/dL   Urobilinogen, UA 0.2  0.0 - 1.0 mg/dL   Nitrite NEGATIVE  NEGATIVE   Leukocytes, UA NEGATIVE  NEGATIVE   Comment: MICROSCOPIC NOT DONE ON URINES WITH NEGATIVE PROTEIN, BLOOD, LEUKOCYTES, NITRITE, OR GLUCOSE <1000 mg/dL.  OCCULT BLOOD, POC DEVICE     Status: Abnormal   Collection Time    12/05/12 12:50 AM      Result Value Range   Fecal Occult Bld POSITIVE (*) NEGATIVE  CG4 I-STAT (LACTIC ACID)     Status: None   Collection Time    12/05/12  1:07 AM      Result Value Range   Lactic Acid, Venous 0.69  0.5 - 2.2 mmol/L    Studies/Results: No results found. Medications: Scheduled Meds: . acidophilus  1 capsule Oral Daily  . cholecalciferol  2,000 Units Oral Q breakfast  . lisinopril  10 mg Oral Daily  . metronidazole  500 mg Intravenous Q8H   . multivitamin with minerals  1 tablet Oral Q breakfast   Continuous Infusions: . sodium chloride 75 mL/hr at 12/05/12 0617   PRN Meds:.acetaminophen, acetaminophen, ondansetron (ZOFRAN) IV, ondansetron  Assessment/Plan: Principal Problem:   GI bleed - etiology still unclear after Abx tx - quite possibly ischemic - re-consult GI and continue metronidazole Active Problems:   Pacemaker implanted 08/28/12 - aware   Essential hypertension, benign - ok   LOS: 0 days   Pearla Dubonnet, MD 12/05/2012, 8:16 AM

## 2012-12-05 NOTE — ED Provider Notes (Addendum)
CSN: [id number]     Arrival date & time 12/04/12  2330 History   First MD Initiated Contact with Patient 12/05/12 0011     Chief Complaint  Patient presents with  . Diarrhea   (Consider location/radiation/quality/duration/timing/severity/associated sxs/prior Treatment) HPI  This is an 79 year old with a history of hypertension and recent history of colitis who presents with diarrhea and bright red blood per rectum. Patient reports that she has had diarrhea since she got discharged on Thursday. She's been taking her antibiotics. Patient states that she felt well on Friday and Saturday but started feeling weak on Sunday. She denies any dizziness or syncope. Patient had onset of loose bloody stools earlier today. She had a total of 2 stools that had bright red blood in them. She denies any melena. She denies any abdominal pain or vomiting. She's not had any fevers. Patient states that she now feels a little lightheaded.    Past Medical History  Diagnosis Date  . Breast cancer   . HTN (hypertension) August 2014  . Syncope and collapse August 2014    sinus pauses/ PTVDP  . Colitis    Past Surgical History  Procedure Laterality Date  . Breast lumpectomy    . Cholecystectomy    . Tonsillectomy    . Pacemaker insertion  08/28/12    MDT   Family History  Problem Relation Age of Onset  . Alzheimer's disease Mother   . Renal Disease Father   . Coronary artery disease Brother    History  Substance Use Topics  . Smoking status: Never Smoker   . Smokeless tobacco: Not on file  . Alcohol Use: No   OB History   Grav Para Term Preterm Abortions TAB SAB Ect Mult Living                 Review of Systems  Constitutional: Negative for fever.  Respiratory: Negative for cough, chest tightness and shortness of breath.   Cardiovascular: Negative for chest pain.  Gastrointestinal: Positive for diarrhea and blood in stool. Negative for nausea, vomiting and abdominal pain.  Genitourinary:  Negative for dysuria.  Musculoskeletal: Negative for back pain.  Neurological: Positive for light-headedness. Negative for syncope, weakness and headaches.  All other systems reviewed and are negative.    Allergies  Codeine  Home Medications   Current Outpatient Rx  Name  Route  Sig  Dispense  Refill  . acetaminophen (TYLENOL) 325 MG tablet   Oral   Take 2 tablets (650 mg total) by mouth every 4 (four) hours as needed.         . B Complex-C (SUPER B COMPLEX PO)   Oral   Take by mouth every other day.         . Cholecalciferol (VITAMIN D) 2000 UNITS tablet   Oral   Take 2,000 Units by mouth daily with breakfast.         . Cyanocobalamin (VITAMIN B-12 PO)   Oral   Take by mouth every other day.         Marland Kitchen GLUCOSAMINE-CHONDROITIN PO   Oral   Take 1 capsule by mouth daily with breakfast.         . lisinopril (PRINIVIL,ZESTRIL) 10 MG tablet   Oral   Take 1 tablet (10 mg total) by mouth daily.   90 tablet   3   . Multiple Vitamin (MULTIVITAMIN WITH MINERALS) TABS   Oral   Take 1 tablet by mouth daily with breakfast.         .  Multiple Vitamins-Minerals (ANTIOXIDANT FORMULA SG) capsule   Oral   Take 1 capsule by mouth daily.         . Omega-3 Fatty Acids (OMEGA 3 PO)   Oral   Take 1 capsule by mouth daily with breakfast.         . Probiotic Product (ALIGN PO)   Oral   Take 1 capsule by mouth daily with breakfast.         . metroNIDAZOLE (FLAGYL) 500 MG tablet   Oral   Take 1 tablet (500 mg total) by mouth every 12 (twelve) hours.   8 tablet   0    BP 147/57  Pulse 78  Temp(Src) 98 F (36.7 C) (Oral)  Resp 20  Wt 133 lb 3 oz (60.413 kg)  SpO2 97% Physical Exam  Nursing note and vitals reviewed. Constitutional: She is oriented to person, place, and time. She appears well-developed and well-nourished. No distress.  Elderly, nontoxic appearing  HENT:  Head: Normocephalic and atraumatic.  Mouth/Throat: Oropharynx is clear and moist.   Eyes: Pupils are equal, round, and reactive to light.  Neck: Neck supple.  Cardiovascular: Normal rate, regular rhythm and normal heart sounds.   No murmur heard. Pulmonary/Chest: Effort normal. No respiratory distress.  Pacemaker palpated in left upper chest  Abdominal: Soft. Bowel sounds are normal. She exhibits no distension.  Mild tenderness to palpation over the left lower quadrant without rebound or guarding  Genitourinary:  No gross blood on rectal exam, normal rectal tone, no hemorrhoids noted  Musculoskeletal: She exhibits no edema.  Neurological: She is alert and oriented to person, place, and time.  Skin: Skin is warm and dry.  Psychiatric: She has a normal mood and affect.    ED Course  Procedures (including critical care time) Labs Review Labs Reviewed  CBC WITH DIFFERENTIAL - Abnormal; Notable for the following:    RBC 3.34 (*)    Hemoglobin 10.2 (*)    HCT 30.2 (*)    RDW 16.8 (*)    Monocytes Relative 15 (*)    Basophils Relative 2 (*)    All other components within normal limits  COMPREHENSIVE METABOLIC PANEL - Abnormal; Notable for the following:    Sodium 134 (*)    AST 43 (*)    ALT 39 (*)    GFR calc non Af Amer 81 (*)    All other components within normal limits  OCCULT BLOOD, POC DEVICE - Abnormal; Notable for the following:    Fecal Occult Bld POSITIVE (*)    All other components within normal limits  URINALYSIS, ROUTINE W REFLEX MICROSCOPIC  CG4 I-STAT (LACTIC ACID)   Imaging Review No results found.  EKG Interpretation   None      Angiocath insertion Performed by: Ross Marcus, F  Consent: Verbal consent obtained. Risks and benefits: risks, benefits and alternatives were discussed Time out: Immediately prior to procedure a "time out" was called to verify the correct patient, procedure, equipment, support staff and site/side marked as required.  Preparation: Patient was prepped and draped in the usual sterile fashion.  Vein  Location: right basilic  Ultrasound Guided  Gauge: 20  Normal blood return and flush without difficulty Patient tolerance: Patient tolerated the procedure well with no immediate complications.    MDM   1. GI bleed     This is an 79 year old female who presents with 2 episodes of bright red blood per time prior to presentation. She has a recent history of  colitis. She is well-appearing on exam and her vital signs are reassuring. She has mild left lower quadrant tenderness without rebound or guarding. Rectal exam does not show any gross blood but occult is positive. Patient dropped her systolic blood pressure by 20 and standing to her heart rate remained the same and she did not get dizzy.  However, she continues to complain of general lightheadedness. Hematocrit is stable. I discussed the patient with the on-call [facility] doc, Dr. Laural Benes.  He feels the patient can likely follow up closely as an outpatient given that she has no evidence of gross blood and has stable vital signs with a stable hematocrit.  On repeat evaluation, patient continuing to complain of lightheadedness. Vital signs remained stable. I discussed the patient with Dr. Gust Brooms who is on call for Dr. Kevan Ny the patient's primary physician. Dr. Gust Brooms is unsure of how soon the patient can get in to see Dr. Kevan Ny and cannot guarantee next day followup. She agrees that admission for observation and repeat labs in the morning is reasonable.    Shon Baton, MD 12/05/12 1610  Shon Baton, MD 12/05/12 [phone]

## 2012-12-05 NOTE — Progress Notes (Signed)
UR completed. Alasdair Kleve RN CCM Case Mgmt phone [phone] 

## 2012-12-05 NOTE — ED Notes (Signed)
Spoke with [facility]-ED[facility] and Melissa, AD about bed assignment for patient. EDP agrees that med-surg on [facility] is appropriate for patient due to being hemodynamically stable while in the care of the ED-per Dr. Wilkie Aye.

## 2012-12-05 NOTE — Progress Notes (Signed)
Patient ID: Tamara [name], female   DOB: [birth year], 79 y.o.   MRN: [id number] [facility] Progress Note  Tamara [name] 79 y.o. Tamara [name], Tamara [name]   Subjective: Patient readmitted for recurrent bloody diarrhea. Denies abdominal pain following recent discharge. Reports normal colonoscopy 7 years ago by Dr. Laural Benes.   Objective: Vital signs in last 24 hours: Filed Vitals:   12/05/12 1358  BP: 129/56  Pulse: 80  Temp: 98.1 F (36.7 C)  Resp:     Physical Exam: Gen: alert, no acute distress Abd: soft, nontender, nondistended, +BS  Lab Results:  Recent Labs  12/04/12 2348 12/05/12 0744  NA 134* 137  K 4.7 4.2  CL 99 105  CO2 25 22  GLUCOSE 97 89  BUN 18 [name]  CREATININE 0.65 0.54  CALCIUM 9.9 9.1    Recent Labs  12/04/12 2348 12/05/12 0744  AST 43* 35  ALT 39* 31  ALKPHOS 98 81  BILITOT 0.3 0.2*  PROT 7.1 6.1  ALBUMIN 3.8 3.2*    Recent Labs  12/04/12 2348 12/05/12 0744  WBC 6.1 5.3  NEUTROABS 2.8 2.4  HGB 10.2* 9.1*  HCT 30.2* 26.6*  MCV 90.4 89.0  PLT 249 213   No results found for this basename: LABPROT, INR,  in the last 72 hours    Assessment/Plan: 79 yo with persistent bloody diarrhea and CT showing descending colon inflammation concerning for ischemia. Doubt infection or malignancy. Needs flex sig tomorrow and will give Miralax prep today. Clears. NPO p MN.   Elsa Ploch C. 12/05/2012, 2:42 PM

## 2012-12-05 NOTE — ED Notes (Signed)
Patient requesting that we wait to start IV until she knows for sure that she will be admitted- MD made aware.

## 2012-12-05 NOTE — ED Notes (Signed)
I stat lactic acid results given to [facility] by B. Bing Plume, EMT

## 2012-12-05 NOTE — Progress Notes (Signed)
Pt came from ED via wheelchair. Pt is alert and oriented with no skin issues. Call bell is placed within reach and bed is in lowest position. Pt made aware that she was placed on bed alarm due to her history of syncope and her admitting diagnosis of GI bleed. Awaiting for admitting to come see patient. Will continue to monitor.   Valor [place] J. Lendell Caprice RN

## 2012-12-05 NOTE — ED Notes (Signed)
IV attempt unsuccessful at this time- IV team has been paged.

## 2012-12-06 ENCOUNTER — Encounter (HOSPITAL_COMMUNITY)
Admission: EM | Disposition: A | Discharge status: Skilled Nursing Facility | Payer: Medicare Other | Source: Home / Self Care | Attending: Internal Medicine

## 2012-12-06 DIAGNOSIS — D490 Neoplasm of unspecified behavior of digestive system: Secondary | ICD-10-CM

## 2012-12-06 DIAGNOSIS — R197 Diarrhea, unspecified: Secondary | ICD-10-CM | POA: Diagnosis present

## 2012-12-06 DIAGNOSIS — K922 Gastrointestinal hemorrhage, unspecified: Secondary | ICD-10-CM

## 2012-12-06 DIAGNOSIS — D62 Acute posthemorrhagic anemia: Secondary | ICD-10-CM

## 2012-12-06 HISTORY — PX: FLEXIBLE SIGMOIDOSCOPY: SHX5431

## 2012-12-06 LAB — BASIC METABOLIC PANEL
CO2: 21 mEq/L (ref 19–32)
Calcium: 8.9 mg/dL (ref 8.4–10.5)
GFR calc non Af Amer: 86 mL/min — ABNORMAL LOW (ref >90)
Glucose, Bld: 88 mg/dL (ref 70–99)
Potassium: 3.9 mEq/L (ref 3.5–5.1)
Sodium: 137 mEq/L (ref 135–145)

## 2012-12-06 LAB — CBC
HCT: 26.3 % — ABNORMAL LOW (ref 36.0–46.0)
Hemoglobin: 8.9 g/dL — ABNORMAL LOW (ref 12.0–15.0)
MCH: 30.6 pg (ref 26.0–34.0)
Platelets: 217 10*3/uL (ref 150–400)
RBC: 2.91 MIL/uL — ABNORMAL LOW (ref 3.87–5.11)
RDW: 16.8 % — ABNORMAL HIGH (ref 11.5–15.5)
WBC: 4.1 10*3/uL (ref 4.0–10.5)

## 2012-12-06 LAB — SURGICAL PCR SCREEN: MRSA, PCR: NEGATIVE

## 2012-12-06 SURGERY — SIGMOIDOSCOPY, FLEXIBLE
Anesthesia: Moderate Sedation

## 2012-12-06 MED ORDER — SPOT INK MARKER SYRINGE KIT
PACK | SUBMUCOSAL | Status: DC | PRN
  Administered 2012-12-06 (×2): 5 mL via SUBMUCOSAL

## 2012-12-06 MED ORDER — DIPHENHYDRAMINE HCL 50 MG/ML IJ SOLN
INTRAMUSCULAR | Status: AC
  Filled 2012-12-06: qty 1

## 2012-12-06 MED ORDER — FENTANYL CITRATE 0.05 MG/ML IJ SOLN
INTRAMUSCULAR | Status: DC | PRN
  Administered 2012-12-06 (×3): 25 ug via INTRAVENOUS

## 2012-12-06 MED ORDER — SPOT INK MARKER SYRINGE KIT
PACK | SUBMUCOSAL | Status: AC
  Filled 2012-12-06: qty 5

## 2012-12-06 MED ORDER — DEXTROSE 5 % IV SOLN
2.0000 g | INTRAVENOUS | Status: AC
  Administered 2012-12-07: 2 g via INTRAVENOUS
  Filled 2012-12-06 (×2): qty 2

## 2012-12-06 MED ORDER — ALVIMOPAN 12 MG PO CAPS
12.0000 mg | ORAL_CAPSULE | ORAL | Status: AC
  Filled 2012-12-06: qty 1

## 2012-12-06 MED ORDER — MIDAZOLAM HCL 5 MG/ML IJ SOLN
INTRAMUSCULAR | Status: AC
  Filled 2012-12-06: qty 2

## 2012-12-06 MED ORDER — FENTANYL CITRATE 0.05 MG/ML IJ SOLN
INTRAMUSCULAR | Status: AC
  Filled 2012-12-06: qty 2

## 2012-12-06 MED ORDER — MIDAZOLAM HCL 10 MG/2ML IJ SOLN
INTRAMUSCULAR | Status: DC | PRN
  Administered 2012-12-06 (×3): 2 mg via INTRAVENOUS

## 2012-12-06 NOTE — Progress Notes (Signed)
Subjective: Tamara [name] feels well this morning.  No abdominal pain.  Still having some dark-colored loose stool.  Flexible sigmoidoscopy scheduled this morning to assess lower bowel  Objective: Weight change:   Intake/Output Summary (Last 24 hours) at 12/06/12 0758 Last data filed at 12/05/12 2100  Gross per 24 hour  Intake   1200 ml  Output      0 ml  Net   1200 ml   Filed Vitals:   12/05/12 1013 12/05/12 1358 12/05/12 2106 12/06/12 0536  BP: 129/75 129/56 152/74 169/78  Pulse: 78 80 82 90  Temp: 98.7 F (37.1 C) 98.1 F (36.7 C) 97.5 F (36.4 C) 98.6 F (37 C)  TempSrc: Oral Oral Oral Oral  Resp:   18 20  Height:      Weight:      SpO2: 97% 100% 98% 98%    General Appearance: Alert, cooperative, no distress, appears stated age  Lungs: Clear to auscultation bilaterally, respirations unlabored  Heart: Regular rate and rhythm, S1 and S2 normal, no murmur, rub or gallop  Abdomen: Soft, non-tender, bowel sounds active all four quadrants, no masses, no organomegaly  Extremities: Extremities normal, atraumatic, no cyanosis or edema  Neuro: Alert and NF    Lab Results: Results for orders placed during the hospital encounter of 12/05/12 (from the past 48 hour(s))  CBC WITH DIFFERENTIAL     Status: Abnormal   Collection Time    12/04/12 11:48 PM      Result Value Range   WBC 6.1  4.0 - 10.5 K/uL   RBC 3.34 (*) 3.87 - 5.11 MIL/uL   Hemoglobin 10.2 (*) 12.0 - 15.0 g/dL   HCT 16.1 (*) 09.6 - 04.5 %   MCV 90.4  78.0 - 100.0 fL   MCH 30.5  26.0 - 34.0 pg   MCHC 33.8  30.0 - 36.0 g/dL   RDW 40.9 (*) 81.1 - 91.4 %   Platelets 249  150 - 400 K/uL   Neutrophils Relative % 45  43 - 77 %   Neutro Abs 2.8  1.7 - 7.7 K/uL   Lymphocytes Relative 36  12 - 46 %   Lymphs Abs 2.2  0.7 - 4.0 K/uL   Monocytes Relative 15 (*) 3 - 12 %   Monocytes Absolute 0.9  0.1 - 1.0 K/uL   Eosinophils Relative 2  0 - 5 %   Eosinophils Absolute 0.1  0.0 - 0.7 K/uL   Basophils Relative 2 (*) 0 - 1  %   Basophils Absolute 0.1  0.0 - 0.1 K/uL  COMPREHENSIVE METABOLIC PANEL     Status: Abnormal   Collection Time    12/04/12 11:48 PM      Result Value Range   Sodium 134 (*) 135 - 145 mEq/L   Potassium 4.7  3.5 - 5.1 mEq/L   Chloride 99  96 - 112 mEq/L   CO2 25  19 - 32 mEq/L   Glucose, Bld 97  70 - 99 mg/dL   BUN 18  6 - 23 mg/dL   Creatinine, Ser 7.82  0.50 - 1.10 mg/dL   Calcium 9.9  8.4 - 95.6 mg/dL   Total Protein 7.1  6.0 - 8.3 g/dL   Albumin 3.8  3.5 - 5.2 g/dL   AST 43 (*) 0 - 37 U/L   ALT 39 (*) 0 - 35 U/L   Alkaline Phosphatase 98  39 - 117 U/L   Total Bilirubin 0.3  0.3 - 1.2  mg/dL   GFR calc non Af Amer 81 (*) >90 mL/min   GFR calc Af Amer >90  >90 mL/min   Comment: (NOTE)     The eGFR has been calculated using the CKD EPI equation.     This calculation has not been validated in all clinical situations.     eGFR's persistently <90 mL/min signify possible Chronic Kidney     Disease.  URINALYSIS, ROUTINE W REFLEX MICROSCOPIC     Status: None   Collection Time    12/05/12 12:47 AM      Result Value Range   Color, Urine YELLOW  YELLOW   APPearance CLEAR  CLEAR   Specific Gravity, Urine 1.006  1.005 - 1.030   pH 6.5  5.0 - 8.0   Glucose, UA NEGATIVE  NEGATIVE mg/dL   Hgb urine dipstick NEGATIVE  NEGATIVE   Bilirubin Urine NEGATIVE  NEGATIVE   Ketones, ur NEGATIVE  NEGATIVE mg/dL   Protein, ur NEGATIVE  NEGATIVE mg/dL   Urobilinogen, UA 0.2  0.0 - 1.0 mg/dL   Nitrite NEGATIVE  NEGATIVE   Leukocytes, UA NEGATIVE  NEGATIVE   Comment: MICROSCOPIC NOT DONE ON URINES WITH NEGATIVE PROTEIN, BLOOD, LEUKOCYTES, NITRITE, OR GLUCOSE <1000 mg/dL.  OCCULT BLOOD, POC DEVICE     Status: Abnormal   Collection Time    12/05/12 12:50 AM      Result Value Range   Fecal Occult Bld POSITIVE (*) NEGATIVE  CG4 I-STAT (LACTIC ACID)     Status: None   Collection Time    12/05/12  1:07 AM      Result Value Range   Lactic Acid, Venous 0.69  0.5 - 2.2 mmol/L  CLOSTRIDIUM DIFFICILE  BY PCR     Status: None   Collection Time    12/05/12  6:25 AM      Result Value Range   C difficile by pcr NEGATIVE  NEGATIVE  CBC WITH DIFFERENTIAL     Status: Abnormal   Collection Time    12/05/12  7:44 AM      Result Value Range   WBC 5.3  4.0 - 10.5 K/uL   RBC 2.99 (*) 3.87 - 5.11 MIL/uL   Hemoglobin 9.1 (*) 12.0 - 15.0 g/dL   HCT 40.9 (*) 81.1 - 91.4 %   MCV 89.0  78.0 - 100.0 fL   MCH 30.4  26.0 - 34.0 pg   MCHC 34.2  30.0 - 36.0 g/dL   RDW 78.2 (*) 95.6 - 21.3 %   Platelets 213  150 - 400 K/uL   Neutrophils Relative % 46  43 - 77 %   Neutro Abs 2.4  1.7 - 7.7 K/uL   Lymphocytes Relative 38  12 - 46 %   Lymphs Abs 2.0  0.7 - 4.0 K/uL   Monocytes Relative 13 (*) 3 - 12 %   Monocytes Absolute 0.7  0.1 - 1.0 K/uL   Eosinophils Relative 2  0 - 5 %   Eosinophils Absolute 0.1  0.0 - 0.7 K/uL   Basophils Relative 1  0 - 1 %   Basophils Absolute 0.1  0.0 - 0.1 K/uL  COMPREHENSIVE METABOLIC PANEL     Status: Abnormal   Collection Time    12/05/12  7:44 AM      Result Value Range   Sodium 137  135 - 145 mEq/L   Potassium 4.2  3.5 - 5.1 mEq/L   Chloride 105  96 - 112 mEq/L   CO2  22  19 - 32 mEq/L   Glucose, Bld 89  70 - 99 mg/dL   BUN 12  6 - 23 mg/dL   Creatinine, Ser 1.61  0.50 - 1.10 mg/dL   Calcium 9.1  8.4 - 09.6 mg/dL   Total Protein 6.1  6.0 - 8.3 g/dL   Albumin 3.2 (*) 3.5 - 5.2 g/dL   AST 35  0 - 37 U/L   ALT 31  0 - 35 U/L   Alkaline Phosphatase 81  39 - 117 U/L   Total Bilirubin 0.2 (*) 0.3 - 1.2 mg/dL   GFR calc non Af Amer 86 (*) >90 mL/min   GFR calc Af Amer >90  >90 mL/min   Comment: (NOTE)     The eGFR has been calculated using the CKD EPI equation.     This calculation has not been validated in all clinical situations.     eGFR's persistently <90 mL/min signify possible Chronic Kidney     Disease.  TYPE AND SCREEN     Status: None   Collection Time    12/05/12  7:44 AM      Result Value Range   ABO/RH(D) A POS     Antibody Screen NEG     Sample  Expiration 12/08/2012    ABO/RH     Status: None   Collection Time    12/05/12  7:44 AM      Result Value Range   ABO/RH(D) A POS    CBC     Status: Abnormal   Collection Time    12/06/12  6:25 AM      Result Value Range   WBC 4.1  4.0 - 10.5 K/uL   RBC 2.91 (*) 3.87 - 5.11 MIL/uL   Hemoglobin 8.9 (*) 12.0 - 15.0 g/dL   HCT 04.5 (*) 40.9 - 81.1 %   MCV 90.4  78.0 - 100.0 fL   MCH 30.6  26.0 - 34.0 pg   MCHC 33.8  30.0 - 36.0 g/dL   RDW 91.4 (*) 78.2 - 95.6 %   Platelets 217  150 - 400 K/uL  BASIC METABOLIC PANEL     Status: Abnormal   Collection Time    12/06/12  6:25 AM      Result Value Range   Sodium 137  135 - 145 mEq/L   Potassium 3.9  3.5 - 5.1 mEq/L   Chloride 106  96 - 112 mEq/L   CO2 21  19 - 32 mEq/L   Glucose, Bld 88  70 - 99 mg/dL   BUN 7  6 - 23 mg/dL   Creatinine, Ser 2.13  0.50 - 1.10 mg/dL   Calcium 8.9  8.4 - 08.6 mg/dL   GFR calc non Af Amer 86 (*) >90 mL/min   GFR calc Af Amer >90  >90 mL/min   Comment: (NOTE)     The eGFR has been calculated using the CKD EPI equation.     This calculation has not been validated in all clinical situations.     eGFR's persistently <90 mL/min signify possible Chronic Kidney     Disease.    Studies/Results: No results found. Medications: Scheduled Meds: . acidophilus  1 capsule Oral Daily  . cholecalciferol  2,000 Units Oral Q breakfast  . lisinopril  10 mg Oral Daily  . metronidazole  500 mg Intravenous Q8H  . multivitamin with minerals  1 tablet Oral Q breakfast   Continuous Infusions: . sodium chloride Stopped (12/05/12 1500)  PRN Meds:.acetaminophen, acetaminophen, ondansetron (ZOFRAN) IV, ondansetron  Assessment/Plan:  Principal Problem:  GI bleed - etiology still unclear after Abx tx - quite possibly ischemic - continue metronidazole and I appreciate Dr. Marge Duncans consultation and plan for workup with sigmoidoscopy today Active Problems:  Pacemaker implanted 08/28/12 - aware  Essential hypertension,  benign - ok Anemia - hemoglobin 8.9 - continue to follow   LOS: 1 day   Pearla Dubonnet, MD 12/06/2012, 7:58 AM

## 2012-12-06 NOTE — Interval H&P Note (Signed)
History and Physical Interval Note:  12/06/2012 10:36 AM  Tamara [name]  has presented today for surgery, with the diagnosis of diarrhea  The various methods of treatment have been discussed with the patient and family. After consideration of risks, benefits and other options for treatment, the patient has consented to  Procedure(s): FLEXIBLE SIGMOIDOSCOPY (N/A) as a surgical intervention .  The patient's history has been reviewed, patient examined, no change in status, stable for surgery.  I have reviewed the patient's chart and labs.  Questions were answered to the patient's satisfaction.     [name] C.

## 2012-12-06 NOTE — Op Note (Signed)
[name] [address] [place], [address]   FLEXIBLE SIGMOIDOSCOPY PROCEDURE REPORT  PATIENT: Tamara, [name]  MR#: [id number] BIRTHDATE: 21-Oct-1929 , 83  yrs. old GENDER: Female ENDOSCOPIST: Charlott Rakes, MD REFERRED BY:  Dr. Kevan Ny PROCEDURE DATE:  12/06/2012 PROCEDURE:     Flexible Sigmoidoscopy with biopsies ASA CLASS:    Class III INDICATIONS: Rectal bleeding; Diarrhea MEDICATIONS:    Fentanyl 75 mcg IV, Versed 6 mg IV  DESCRIPTION OF PROCEDURE:   Rectal exam normal. Pediatric colonoscope inserted into an adequately prepped colon and advanced to the hepatic flexure. During insertion the sigmoid and descending colon were normal in appearance. At the splenic flexure a focal area of erythema was seen without ulceration. In the transverse colon a fungating ulcerated nonbostructing mass was seen consistent with malignancy. Biopsies were taken and tattooing was completed successfully. The colonoscope advanced proximal to the mass to the hepatic flexure without difficulty. Due to the large ulcerated mass and concern for bleeding, the colonoscope was not advanced proximal to the hepatic flexure. The colonoscope was withdrawn and retroflexion showed small internal hemorrhoids.        COMPLICATIONS:  None  ENDOSCOPIC IMPRESSION: Fungating ulcerated nonobstructing mass in transverse colon -s/p biopsies and tattooing; Internal hemorrhoids; No visualized evidence of ischemic colitis in the examined colon.  RECOMMENDATIONS:  Clears; Surgical consult; F/U on path   _______________________________ eSigned:  Charlott Rakes, MD 12/06/2012 11:20 AM   PATIENT NAME:  Tamara, [name] MR#: [id number]

## 2012-12-06 NOTE — Brief Op Note (Signed)
Nonobstructing colon mass in transverse colon that is likely malignant. Surgery consult. Clear liquid. See endopro for details.

## 2012-12-06 NOTE — Consult Note (Signed)
The procedure was discussed with the patient.  Laparoscopic partial colectomy discussed with the patient as well as non operative treatments. The risks of operative management include bleeding,  Infection,  Leak of anastamosis,  Ostomy formation, open procedure,  Sepsis,  Abcess,  Hernia,  DVT,  Pulmonary complications,  Cardiovascular  complications,  Injury to ureter,  Bladder,kidney,and anesthesia risks,  And death. The patient understands.  Questions answered.   The success of the procedure is 50-85 % for treating the patients symptoms. She agree to proceed.

## 2012-12-06 NOTE — Consult Note (Signed)
Tamara [name] 07-07-1929  [id number].    Requesting MD: Dr. Bosie Clos Chief Complaint/Reason for Consult: non-obstructing transverse colon mass HPI:  79 y.o. female who was recently admitted (11/26/12-11/30/12) for colitis and was discharged home on Cipro/flagyl presented to [facility] on 12/05/12 because of rectal bleeding. Patient states that since her discharge she has been having dark liquidy stools. Yesterday she had at least 4 episodes and last episode was frankly bloody. Her hemoglobin in the ER was stable when compared to her recent. Patient is hemodynamically stable. Patient states that during her last admission she had significant abdominal pain, dizziness which at this time patient denies any abdominal pain, fever, chills, nausea/vomiting, chest pain/SOB, or headaches. Patient was admitted on 12/05/12 by the [facility]ad hospitalist service and is being followed by Dr. Kevan Ny her PCP.    [facility] was consulted and completed a colonoscopy today which showed a nonobstructive transverse colon mass which is thought to be malignant.  Her H&H were 10.2/30.2 on arrival and have since dropped to Hgb 8.9.  Her only abdominal surgery was a open cholecystectomy in 1987 and she notes to have an "internal hernia" a few years ago which has resolved without surgical management.  No other gastrointestinal problems in the past.    ROS: All systems reviewed and otherwise negative except for as above  Family History  Problem Relation Age of Onset  . Alzheimer's disease Mother   . Renal Disease Father   . Coronary artery disease Brother     Past Medical History  Diagnosis Date  . Breast cancer   . HTN (hypertension) August 2014  . Syncope and collapse August 2014    sinus pauses/ PTVDP  . Colitis     Past Surgical History  Procedure Laterality Date  . Breast lumpectomy    . Cholecystectomy    . Tonsillectomy    . Pacemaker insertion  08/28/12    MDT    Social History:  reports that she has never smoked.  She does not have any smokeless tobacco history on file. She reports that she does not drink alcohol or use illicit drugs.  Allergies:  Allergies  Allergen Reactions  . Codeine Nausea And Vomiting    Medications Prior to Admission  Medication Sig Dispense Refill  . acetaminophen (TYLENOL) 325 MG tablet Take 2 tablets (650 mg total) by mouth every 4 (four) hours as needed.      . B Complex-C (SUPER B COMPLEX PO) Take by mouth every other day.      . Cholecalciferol (VITAMIN D) 2000 UNITS tablet Take 2,000 Units by mouth daily with breakfast.      . Cyanocobalamin (VITAMIN B-12 PO) Take by mouth every other day.      Marland Kitchen GLUCOSAMINE-CHONDROITIN PO Take 1 capsule by mouth daily with breakfast.      . lisinopril (PRINIVIL,ZESTRIL) 10 MG tablet Take 1 tablet (10 mg total) by mouth daily.  90 tablet  3  . Multiple Vitamin (MULTIVITAMIN WITH MINERALS) TABS Take 1 tablet by mouth daily with breakfast.      . Multiple Vitamins-Minerals (ANTIOXIDANT FORMULA SG) capsule Take 1 capsule by mouth daily.      . Omega-3 Fatty Acids (OMEGA 3 PO) Take 1 capsule by mouth daily with breakfast.      . Probiotic Product (ALIGN PO) Take 1 capsule by mouth daily with breakfast.      . metroNIDAZOLE (FLAGYL) 500 MG tablet Take 1 tablet (500 mg total) by mouth every 12 (twelve) hours.  8 tablet  0    Blood pressure 159/76, pulse 65, temperature 97.7 F (36.5 C), temperature source Axillary, resp. rate 18, height 5\' 5"  (1.651 m), weight 127 lb 10.3 oz (57.9 kg), SpO2 97.00%. Physical Exam: General: pleasant, WD/WN white female who is laying in bed in NAD HEENT: head is normocephalic, atraumatic.  Sclera are noninjected.  PERRL.  Ears and nose without any masses or lesions.  Mouth is pink and moist Heart: regular, rate, and rhythm.  No obvious murmurs, gallops, or rubs noted.  Defib palpated in left chest.  Palpable pedal pulses bilaterally Lungs: CTAB, no wheezes, rhonchi, or rales noted.  Respiratory effort  nonlabored Abd: soft, NT/ND, +BS, no masses, hernias, or organomegaly; RUQ horizontal scar from open GB surgery MS: all 4 extremities are symmetrical with no cyanosis, clubbing, or edema. Skin: warm and dry with no masses, lesions, or rashes Psych: A&Ox3 with an appropriate affect.  Results for orders placed during the hospital encounter of 12/05/12 (from the past 48 hour(s))  CBC WITH DIFFERENTIAL     Status: Abnormal   Collection Time    12/04/12 11:48 PM      Result Value Range   WBC 6.1  4.0 - 10.5 K/uL   RBC 3.34 (*) 3.87 - 5.11 MIL/uL   Hemoglobin 10.2 (*) 12.0 - 15.0 g/dL   HCT 08.6 (*) 57.8 - 46.9 %   MCV 90.4  78.0 - 100.0 fL   MCH 30.5  26.0 - 34.0 pg   MCHC 33.8  30.0 - 36.0 g/dL   RDW 62.9 (*) 52.8 - 41.3 %   Platelets 249  150 - 400 K/uL   Neutrophils Relative % 45  43 - 77 %   Neutro Abs 2.8  1.7 - 7.7 K/uL   Lymphocytes Relative 36  12 - 46 %   Lymphs Abs 2.2  0.7 - 4.0 K/uL   Monocytes Relative 15 (*) 3 - 12 %   Monocytes Absolute 0.9  0.1 - 1.0 K/uL   Eosinophils Relative 2  0 - 5 %   Eosinophils Absolute 0.1  0.0 - 0.7 K/uL   Basophils Relative 2 (*) 0 - 1 %   Basophils Absolute 0.1  0.0 - 0.1 K/uL  COMPREHENSIVE METABOLIC PANEL     Status: Abnormal   Collection Time    12/04/12 11:48 PM      Result Value Range   Sodium 134 (*) 135 - 145 mEq/L   Potassium 4.7  3.5 - 5.1 mEq/L   Chloride 99  96 - 112 mEq/L   CO2 25  19 - 32 mEq/L   Glucose, Bld 97  70 - 99 mg/dL   BUN 18  6 - 23 mg/dL   Creatinine, Ser 2.44  0.50 - 1.10 mg/dL   Calcium 9.9  8.4 - 01.0 mg/dL   Total Protein 7.1  6.0 - 8.3 g/dL   Albumin 3.8  3.5 - 5.2 g/dL   AST 43 (*) 0 - 37 U/L   ALT 39 (*) 0 - 35 U/L   Alkaline Phosphatase 98  39 - 117 U/L   Total Bilirubin 0.3  0.3 - 1.2 mg/dL   GFR calc non Af Amer 81 (*) >90 mL/min   GFR calc Af Amer >90  >90 mL/min   Comment: (NOTE)     The eGFR has been calculated using the CKD EPI equation.     This calculation has not been validated in all  clinical situations.  eGFR's persistently <90 mL/min signify possible Chronic Kidney     Disease.  URINALYSIS, ROUTINE W REFLEX MICROSCOPIC     Status: None   Collection Time    12/05/12 12:47 AM      Result Value Range   Color, Urine YELLOW  YELLOW   APPearance CLEAR  CLEAR   Specific Gravity, Urine 1.006  1.005 - 1.030   pH 6.5  5.0 - 8.0   Glucose, UA NEGATIVE  NEGATIVE mg/dL   Hgb urine dipstick NEGATIVE  NEGATIVE   Bilirubin Urine NEGATIVE  NEGATIVE   Ketones, ur NEGATIVE  NEGATIVE mg/dL   Protein, ur NEGATIVE  NEGATIVE mg/dL   Urobilinogen, UA 0.2  0.0 - 1.0 mg/dL   Nitrite NEGATIVE  NEGATIVE   Leukocytes, UA NEGATIVE  NEGATIVE   Comment: MICROSCOPIC NOT DONE ON URINES WITH NEGATIVE PROTEIN, BLOOD, LEUKOCYTES, NITRITE, OR GLUCOSE <1000 mg/dL.  OCCULT BLOOD, POC DEVICE     Status: Abnormal   Collection Time    12/05/12 12:50 AM      Result Value Range   Fecal Occult Bld POSITIVE (*) NEGATIVE  CG4 I-STAT (LACTIC ACID)     Status: None   Collection Time    12/05/12  1:07 AM      Result Value Range   Lactic Acid, Venous 0.69  0.5 - 2.2 mmol/L  CLOSTRIDIUM DIFFICILE BY PCR     Status: None   Collection Time    12/05/12  6:25 AM      Result Value Range   C difficile by pcr NEGATIVE  NEGATIVE  CBC WITH DIFFERENTIAL     Status: Abnormal   Collection Time    12/05/12  7:44 AM      Result Value Range   WBC 5.3  4.0 - 10.5 K/uL   RBC 2.99 (*) 3.87 - 5.11 MIL/uL   Hemoglobin 9.1 (*) 12.0 - 15.0 g/dL   HCT 82.9 (*) 56.2 - 13.0 %   MCV 89.0  78.0 - 100.0 fL   MCH 30.4  26.0 - 34.0 pg   MCHC 34.2  30.0 - 36.0 g/dL   RDW 86.5 (*) 78.4 - 69.6 %   Platelets 213  150 - 400 K/uL   Neutrophils Relative % 46  43 - 77 %   Neutro Abs 2.4  1.7 - 7.7 K/uL   Lymphocytes Relative 38  12 - 46 %   Lymphs Abs 2.0  0.7 - 4.0 K/uL   Monocytes Relative 13 (*) 3 - 12 %   Monocytes Absolute 0.7  0.1 - 1.0 K/uL   Eosinophils Relative 2  0 - 5 %   Eosinophils Absolute 0.1  0.0 - 0.7 K/uL    Basophils Relative 1  0 - 1 %   Basophils Absolute 0.1  0.0 - 0.1 K/uL  COMPREHENSIVE METABOLIC PANEL     Status: Abnormal   Collection Time    12/05/12  7:44 AM      Result Value Range   Sodium 137  135 - 145 mEq/L   Potassium 4.2  3.5 - 5.1 mEq/L   Chloride 105  96 - 112 mEq/L   CO2 22  19 - 32 mEq/L   Glucose, Bld 89  70 - 99 mg/dL   BUN 12  6 - 23 mg/dL   Creatinine, Ser 2.95  0.50 - 1.10 mg/dL   Calcium 9.1  8.4 - 28.4 mg/dL   Total Protein 6.1  6.0 - 8.3 g/dL   Albumin 3.2 (*)  3.5 - 5.2 g/dL   AST 35  0 - 37 U/L   ALT 31  0 - 35 U/L   Alkaline Phosphatase 81  39 - 117 U/L   Total Bilirubin 0.2 (*) 0.3 - 1.2 mg/dL   GFR calc non Af Amer 86 (*) >90 mL/min   GFR calc Af Amer >90  >90 mL/min   Comment: (NOTE)     The eGFR has been calculated using the CKD EPI equation.     This calculation has not been validated in all clinical situations.     eGFR's persistently <90 mL/min signify possible Chronic Kidney     Disease.  TYPE AND SCREEN     Status: None   Collection Time    12/05/12  7:44 AM      Result Value Range   ABO/RH(D) A POS     Antibody Screen NEG     Sample Expiration 12/08/2012    ABO/RH     Status: None   Collection Time    12/05/12  7:44 AM      Result Value Range   ABO/RH(D) A POS    CBC     Status: Abnormal   Collection Time    12/06/12  6:25 AM      Result Value Range   WBC 4.1  4.0 - 10.5 K/uL   RBC 2.91 (*) 3.87 - 5.11 MIL/uL   Hemoglobin 8.9 (*) 12.0 - 15.0 g/dL   HCT 16.1 (*) 09.6 - 04.5 %   MCV 90.4  78.0 - 100.0 fL   MCH 30.6  26.0 - 34.0 pg   MCHC 33.8  30.0 - 36.0 g/dL   RDW 40.9 (*) 81.1 - 91.4 %   Platelets 217  150 - 400 K/uL  BASIC METABOLIC PANEL     Status: Abnormal   Collection Time    12/06/12  6:25 AM      Result Value Range   Sodium 137  135 - 145 mEq/L   Potassium 3.9  3.5 - 5.1 mEq/L   Chloride 106  96 - 112 mEq/L   CO2 21  19 - 32 mEq/L   Glucose, Bld 88  70 - 99 mg/dL   BUN 7  6 - 23 mg/dL   Creatinine, Ser 7.82  0.50  - 1.10 mg/dL   Calcium 8.9  8.4 - 95.6 mg/dL   GFR calc non Af Amer 86 (*) >90 mL/min   GFR calc Af Amer >90  >90 mL/min   Comment: (NOTE)     The eGFR has been calculated using the CKD EPI equation.     This calculation has not been validated in all clinical situations.     eGFR's persistently <90 mL/min signify possible Chronic Kidney     Disease.   No results found.     Assessment/Plan Non-obstruction colon mass in the transverse colon suspected to be malignant per sigmoidoscopy ABL Anemia from GI bleed 1.  Plan for OR tomorrow for laparoscopic possible open colectomy 2.  Clear liquids for now, NPO after midnight 3.  Ordered bowel prep antibiotics, pre-op heparin, and entereg 4.  Consent form 5.  Discussed with patient and husband at bedside who understand the risks (bleeding, infection, injury to other abdominal structures, anastomosis leak) and wish to proceed as soon as possible. 6.  CXR and ECG have been done   DORT, Aundra Millet 12/06/2012, 2:21 PM Pager: [phone]

## 2012-12-06 NOTE — H&P (View-Only) (Signed)
Patient ID: Tamara [name], female   DOB: 11/03/1929, 79 y.o.   MRN: [id number] [facility] Progress Note  Tamara [name] 79 y.o. 08/06/1929   Subjective: Patient readmitted for recurrent bloody diarrhea. Denies abdominal pain following recent discharge. Reports normal colonoscopy 7 years ago by Dr. Johnson.   Objective: Vital signs in last 24 hours: Filed Vitals:   12/05/12 1358  BP: 129/56  Pulse: 80  Temp: 98.1 F (36.7 C)  Resp:     Physical Exam: Gen: alert, no acute distress Abd: soft, nontender, nondistended, +BS  Lab Results:  Recent Labs  12/04/12 2348 12/05/12 0744  NA 134* 137  K 4.7 4.2  CL 99 105  CO2 25 22  GLUCOSE 97 89  BUN 18 12  CREATININE 0.65 0.54  CALCIUM 9.9 9.1    Recent Labs  12/04/12 2348 12/05/12 0744  AST 43* 35  ALT 39* 31  ALKPHOS 98 81  BILITOT 0.3 0.2*  PROT 7.1 6.1  ALBUMIN 3.8 3.2*    Recent Labs  12/04/12 2348 12/05/12 0744  WBC 6.1 5.3  NEUTROABS 2.8 2.4  HGB 10.2* 9.1*  HCT 30.2* 26.6*  MCV 90.4 89.0  PLT 249 213   No results found for this basename: LABPROT, INR,  in the last 72 hours    Assessment/Plan: 79 yo with persistent bloody diarrhea and CT showing descending colon inflammation concerning for ischemia. Doubt infection or malignancy. Needs flex sig tomorrow and will give Miralax prep today. Clears. NPO p MN.   Tamara [name] C. 12/05/2012, 2:42 PM   

## 2012-12-07 ENCOUNTER — Encounter (HOSPITAL_COMMUNITY)
Admission: EM | Disposition: A | Discharge status: Skilled Nursing Facility | Payer: Self-pay | Source: Home / Self Care | Attending: Internal Medicine

## 2012-12-07 ENCOUNTER — Encounter (HOSPITAL_COMMUNITY): Payer: Self-pay | Admitting: Gastroenterology

## 2012-12-07 ENCOUNTER — Inpatient Hospital Stay (HOSPITAL_COMMUNITY): Payer: Medicare Other | Admitting: Certified Registered"

## 2012-12-07 ENCOUNTER — Telehealth: Payer: Self-pay | Admitting: Hematology & Oncology

## 2012-12-07 ENCOUNTER — Encounter (HOSPITAL_COMMUNITY): Payer: Medicare Other | Admitting: Certified Registered"

## 2012-12-07 DIAGNOSIS — C189 Malignant neoplasm of colon, unspecified: Secondary | ICD-10-CM

## 2012-12-07 HISTORY — PX: LAPAROSCOPIC PARTIAL COLECTOMY: SHX5907

## 2012-12-07 LAB — CBC
MCH: 30.4 pg (ref 26.0–34.0)
MCV: 90.4 fL (ref 78.0–100.0)
Platelets: 241 10*3/uL (ref 150–400)
RDW: 16.7 % — ABNORMAL HIGH (ref 11.5–15.5)

## 2012-12-07 SURGERY — LAPAROSCOPIC PARTIAL COLECTOMY
Anesthesia: General | Site: Abdomen | Wound class: Clean Contaminated

## 2012-12-07 MED ORDER — SODIUM CHLORIDE 0.9 % IR SOLN
Status: DC | PRN
  Administered 2012-12-07: 1000 mL

## 2012-12-07 MED ORDER — HYDROMORPHONE HCL PF 1 MG/ML IJ SOLN
0.2500 mg | INTRAMUSCULAR | Status: DC | PRN
  Administered 2012-12-07 (×4): 0.5 mg via INTRAVENOUS

## 2012-12-07 MED ORDER — HYDROMORPHONE HCL PF 1 MG/ML IJ SOLN
INTRAMUSCULAR | Status: AC
  Administered 2012-12-07: 0.5 mg via INTRAVENOUS
  Filled 2012-12-07: qty 1

## 2012-12-07 MED ORDER — FENTANYL CITRATE 0.05 MG/ML IJ SOLN
50.0000 ug | INTRAMUSCULAR | Status: DC | PRN
  Administered 2012-12-07 – 2012-12-12 (×11): 50 ug via INTRAVENOUS
  Filled 2012-12-07 (×11): qty 2

## 2012-12-07 MED ORDER — BUPIVACAINE-EPINEPHRINE PF 0.25-1:200000 % IJ SOLN
INTRAMUSCULAR | Status: AC
  Filled 2012-12-07: qty 30

## 2012-12-07 MED ORDER — ROCURONIUM BROMIDE 100 MG/10ML IV SOLN
INTRAVENOUS | Status: DC | PRN
  Administered 2012-12-07: 5 mg via INTRAVENOUS
  Administered 2012-12-07: 40 mg via INTRAVENOUS

## 2012-12-07 MED ORDER — NEOSTIGMINE METHYLSULFATE 1 MG/ML IJ SOLN
INTRAMUSCULAR | Status: DC | PRN
  Administered 2012-12-07: 4 mg via INTRAVENOUS

## 2012-12-07 MED ORDER — LACTATED RINGERS IV SOLN
INTRAVENOUS | Status: DC
  Administered 2012-12-07: 11:00:00 via INTRAVENOUS

## 2012-12-07 MED ORDER — FENTANYL CITRATE 0.05 MG/ML IJ SOLN
INTRAMUSCULAR | Status: DC | PRN
  Administered 2012-12-07 (×7): 50 ug via INTRAVENOUS

## 2012-12-07 MED ORDER — ALBUMIN HUMAN 5 % IV SOLN
INTRAVENOUS | Status: DC | PRN
  Administered 2012-12-07: 12:00:00 via INTRAVENOUS

## 2012-12-07 MED ORDER — 0.9 % SODIUM CHLORIDE (POUR BTL) OPTIME
TOPICAL | Status: DC | PRN
  Administered 2012-12-07 (×3): 1000 mL

## 2012-12-07 MED ORDER — DIAZEPAM 5 MG/ML IJ SOLN
5.0000 mg | Freq: Once | INTRAMUSCULAR | Status: AC
  Administered 2012-12-07: 5 mg via INTRAVENOUS

## 2012-12-07 MED ORDER — DEXTROSE IN LACTATED RINGERS 5 % IV SOLN
INTRAVENOUS | Status: DC
  Administered 2012-12-07 – 2012-12-09 (×3): via INTRAVENOUS

## 2012-12-07 MED ORDER — DIAZEPAM 5 MG/ML IJ SOLN
INTRAMUSCULAR | Status: AC
  Filled 2012-12-07: qty 2

## 2012-12-07 MED ORDER — ALVIMOPAN 12 MG PO CAPS
12.0000 mg | ORAL_CAPSULE | Freq: Two times a day (BID) | ORAL | Status: DC
  Administered 2012-12-08 – 2012-12-11 (×7): 12 mg via ORAL
  Filled 2012-12-07 (×10): qty 1

## 2012-12-07 MED ORDER — ENOXAPARIN SODIUM 40 MG/0.4ML ~~LOC~~ SOLN
40.0000 mg | SUBCUTANEOUS | Status: DC
  Administered 2012-12-08 – 2012-12-10 (×3): 40 mg via SUBCUTANEOUS
  Filled 2012-12-07 (×4): qty 0.4

## 2012-12-07 MED ORDER — LISINOPRIL 10 MG PO TABS
10.0000 mg | ORAL_TABLET | Freq: Every day | ORAL | Status: DC
  Administered 2012-12-08 – 2012-12-11 (×4): 10 mg via ORAL
  Filled 2012-12-07 (×5): qty 1

## 2012-12-07 MED ORDER — ONDANSETRON HCL 4 MG/2ML IJ SOLN
INTRAMUSCULAR | Status: DC | PRN
  Administered 2012-12-07: 4 mg via INTRAVENOUS

## 2012-12-07 MED ORDER — BUPIVACAINE-EPINEPHRINE (PF) 0.25% -1:200000 IJ SOLN
INTRAMUSCULAR | Status: DC | PRN
  Administered 2012-12-07: 30 mL

## 2012-12-07 MED ORDER — MIDAZOLAM HCL 2 MG/2ML IJ SOLN: 1.0000 mg | INTRAMUSCULAR | Status: DC | PRN

## 2012-12-07 MED ORDER — ONDANSETRON HCL 4 MG/2ML IJ SOLN
4.0000 mg | INTRAMUSCULAR | Status: DC | PRN
  Administered 2012-12-09 – 2012-12-10 (×5): 4 mg via INTRAVENOUS
  Filled 2012-12-07 (×5): qty 2

## 2012-12-07 MED ORDER — LACTATED RINGERS IV SOLN
INTRAVENOUS | Status: DC | PRN
  Administered 2012-12-07 (×2): via INTRAVENOUS

## 2012-12-07 MED ORDER — OXYCODONE HCL 5 MG PO TABS
5.0000 mg | ORAL_TABLET | Freq: Once | ORAL | Status: DC | PRN
Start: 2012-12-07 — End: 2012-12-07

## 2012-12-07 MED ORDER — PHENYLEPHRINE HCL 10 MG/ML IJ SOLN
INTRAMUSCULAR | Status: DC | PRN
  Administered 2012-12-07 (×3): 40 ug via INTRAVENOUS

## 2012-12-07 MED ORDER — GLYCOPYRROLATE 0.2 MG/ML IJ SOLN
INTRAMUSCULAR | Status: DC | PRN
  Administered 2012-12-07: 0.6 mg via INTRAVENOUS
  Administered 2012-12-07: 0.2 mg via INTRAVENOUS

## 2012-12-07 MED ORDER — OXYCODONE HCL 5 MG/5ML PO SOLN: 5.0000 mg | Freq: Once | ORAL | Status: DC | PRN

## 2012-12-07 MED ORDER — FENTANYL CITRATE 0.05 MG/ML IJ SOLN
50.0000 ug | Freq: Once | INTRAMUSCULAR | Status: DC
  Filled 2012-12-07: qty 2

## 2012-12-07 MED ORDER — PROPOFOL 10 MG/ML IV BOLUS
INTRAVENOUS | Status: DC | PRN
  Administered 2012-12-07: 100 mg via INTRAVENOUS
  Administered 2012-12-07: 20 mg via INTRAVENOUS

## 2012-12-07 MED ORDER — LIDOCAINE HCL (CARDIAC) 20 MG/ML IV SOLN
INTRAVENOUS | Status: DC | PRN
  Administered 2012-12-07: 50 mg via INTRAVENOUS

## 2012-12-07 SURGICAL SUPPLY — 80 items
APPLIER CLIP ROT 10 11.4 M/L (STAPLE)
BLADE SURG 10 STRL SS (BLADE) ×2 IMPLANT
BLADE SURG ROTATE 9660 (MISCELLANEOUS) IMPLANT
CANISTER SUCTION 2500CC (MISCELLANEOUS) ×4 IMPLANT
CELLS DAT CNTRL 66122 CELL SVR (MISCELLANEOUS) IMPLANT
CHLORAPREP W/TINT 26ML (MISCELLANEOUS) ×2 IMPLANT
CLIP APPLIE ROT 10 11.4 M/L (STAPLE) IMPLANT
COVER MAYO STAND STRL (DRAPES) ×2 IMPLANT
COVER SURGICAL LIGHT HANDLE (MISCELLANEOUS) ×2 IMPLANT
DECANTER SPIKE VIAL GLASS SM (MISCELLANEOUS) IMPLANT
DRAPE PROXIMA HALF (DRAPES) IMPLANT
DRAPE UTILITY 15X26 W/TAPE STR (DRAPE) ×6 IMPLANT
DRAPE WARM FLUID 44X44 (DRAPE) ×2 IMPLANT
DRSG OPSITE POSTOP 4X10 (GAUZE/BANDAGES/DRESSINGS) IMPLANT
DRSG OPSITE POSTOP 4X6 (GAUZE/BANDAGES/DRESSINGS) ×2 IMPLANT
DRSG OPSITE POSTOP 4X8 (GAUZE/BANDAGES/DRESSINGS) IMPLANT
ELECT BLADE 6.5 EXT (BLADE) ×2 IMPLANT
ELECT CAUTERY BLADE 6.4 (BLADE) ×6 IMPLANT
ELECT REM PT RETURN 9FT ADLT (ELECTROSURGICAL) ×2
ELECTRODE REM PT RTRN 9FT ADLT (ELECTROSURGICAL) ×1 IMPLANT
GAUZE SPONGE 2X2 8PLY STRL LF (GAUZE/BANDAGES/DRESSINGS) ×1 IMPLANT
GEL ULTRASOUND 20GR AQUASONIC (MISCELLANEOUS) IMPLANT
GLOVE BIO SURGEON STRL SZ8 (GLOVE) ×4 IMPLANT
GLOVE BIOGEL PI IND STRL 6.5 (GLOVE) ×2 IMPLANT
GLOVE BIOGEL PI IND STRL 7.5 (GLOVE) ×1 IMPLANT
GLOVE BIOGEL PI IND STRL 8 (GLOVE) ×4 IMPLANT
GLOVE BIOGEL PI INDICATOR 6.5 (GLOVE) ×2
GLOVE BIOGEL PI INDICATOR 7.5 (GLOVE) ×1
GLOVE BIOGEL PI INDICATOR 8 (GLOVE) ×4
GLOVE ECLIPSE 7.0 STRL STRAW (GLOVE) ×4 IMPLANT
GOWN STRL NON-REIN LRG LVL3 (GOWN DISPOSABLE) ×14 IMPLANT
GOWN STRL REIN XL XLG (GOWN DISPOSABLE) ×4 IMPLANT
KIT BASIN OR (CUSTOM PROCEDURE TRAY) ×2 IMPLANT
KIT ROOM TURNOVER OR (KITS) ×2 IMPLANT
LEGGING LITHOTOMY PAIR STRL (DRAPES) IMPLANT
LIGASURE IMPACT 36 18CM CVD LR (INSTRUMENTS) ×2 IMPLANT
NS IRRIG 1000ML POUR BTL (IV SOLUTION) ×6 IMPLANT
PAD ARMBOARD 7.5X6 YLW CONV (MISCELLANEOUS) ×4 IMPLANT
PENCIL BUTTON HOLSTER BLD 10FT (ELECTRODE) ×4 IMPLANT
RELOAD PROXIMATE 75MM BLUE (ENDOMECHANICALS) ×10 IMPLANT
RTRCTR WOUND ALEXIS 18CM MED (MISCELLANEOUS)
SCALPEL HARMONIC ACE (MISCELLANEOUS) ×2 IMPLANT
SCISSORS LAP 5X35 DISP (ENDOMECHANICALS) ×2 IMPLANT
SET IRRIG TUBING LAPAROSCOPIC (IRRIGATION / IRRIGATOR) ×2 IMPLANT
SLEEVE ENDOPATH XCEL 5M (ENDOMECHANICALS) ×4 IMPLANT
SPECIMEN JAR LARGE (MISCELLANEOUS) IMPLANT
SPONGE GAUZE 2X2 STER 10/PKG (GAUZE/BANDAGES/DRESSINGS) ×1
STAPLER GUN LINEAR PROX 60 (STAPLE) ×2 IMPLANT
STAPLER PROXIMATE 75MM BLUE (STAPLE) ×2 IMPLANT
STAPLER VISISTAT 35W (STAPLE) ×2 IMPLANT
SUCTION POOLE TIP (SUCTIONS) ×2 IMPLANT
SURGILUBE 2OZ TUBE FLIPTOP (MISCELLANEOUS) IMPLANT
SUT PDS AB 1 CT  36 (SUTURE)
SUT PDS AB 1 CT 36 (SUTURE) IMPLANT
SUT PDS AB 1 CTX 36 (SUTURE) ×6 IMPLANT
SUT PROLENE 2 0 CT2 30 (SUTURE) IMPLANT
SUT PROLENE 2 0 KS (SUTURE) IMPLANT
SUT SILK 2 0 (SUTURE)
SUT SILK 2-0 18XBRD TIE 12 (SUTURE) IMPLANT
SUT SILK 3 0 (SUTURE)
SUT SILK 3-0 18XBRD TIE 12 (SUTURE) IMPLANT
SUT VIC AB 3-0 54X BRD REEL (SUTURE) ×1 IMPLANT
SUT VIC AB 3-0 BRD 54 (SUTURE) ×1
SUT VIC AB 3-0 SH 18 (SUTURE) ×12 IMPLANT
SUT VICRYL AB 2 0 TIES (SUTURE) IMPLANT
SYR BULB IRRIGATION 50ML (SYRINGE) IMPLANT
SYS LAPSCP GELPORT 120MM (MISCELLANEOUS) ×2
SYSTEM LAPSCP GELPORT 120MM (MISCELLANEOUS) ×1 IMPLANT
TOWEL OR 17X26 10 PK STRL BLUE (TOWEL DISPOSABLE) ×4 IMPLANT
TRAY FOLEY CATH 14FRSI W/METER (CATHETERS) ×2 IMPLANT
TRAY LAPAROSCOPIC (CUSTOM PROCEDURE TRAY) ×2 IMPLANT
TRAY PROCTOSCOPIC FIBER OPTIC (SET/KITS/TRAYS/PACK) IMPLANT
TROCAR XCEL BLADELESS 5X75MML (TROCAR) ×4 IMPLANT
TROCAR XCEL BLUNT TIP 100MML (ENDOMECHANICALS) IMPLANT
TROCAR XCEL NON-BLD 11X100MML (ENDOMECHANICALS) ×2 IMPLANT
TROCAR XCEL NON-BLD 5MMX100MML (ENDOMECHANICALS) ×2 IMPLANT
TUBE CONNECTING 12X1/4 (SUCTIONS) ×4 IMPLANT
TUBING FILTER THERMOFLATOR (ELECTROSURGICAL) ×2 IMPLANT
WATER STERILE IRR 1000ML POUR (IV SOLUTION) IMPLANT
YANKAUER SUCT BULB TIP NO VENT (SUCTIONS) ×4 IMPLANT

## 2012-12-07 NOTE — Progress Notes (Signed)
Subjective: Patient seems to have slept well and feels well.  Anticipating surgery today  Objective: Weight change:   Intake/Output Summary (Last 24 hours) at 12/07/12 0529 Last data filed at 12/07/12 0342  Gross per 24 hour  Intake   1420 ml  Output   3501 ml  Net  -2081 ml   Filed Vitals:   12/06/12 1217 12/06/12 1441 12/06/12 2024 12/07/12 0429  BP: 159/76 159/75 169/77 172/75  Pulse: 65 75 83 80  Temp: 97.7 F (36.5 C) 97.4 F (36.3 C) 98.7 F (37.1 C) 98.7 F (37.1 C)  TempSrc: Axillary Oral Oral Oral  Resp: 18 18 20 18   Height:      Weight:      SpO2: 97% 99% 97% 97%    General Appearance: Alert, cooperative, no distress, appears stated age Lungs: Clear to auscultation bilaterally, respirations unlabored Heart: Regular rate and rhythm, S1 and S2 normal, no murmur, rub or gallop Abdomen: Soft, non-tender, bowel sounds active all four quadrants, no masses, no organomegaly Extremities: Extremities normal, atraumatic, no cyanosis or edema Neuro: Alert and oriented nonfocal  Lab Results: Results for orders placed during the hospital encounter of 12/05/12 (from the past 48 hour(s))  CLOSTRIDIUM DIFFICILE BY PCR     Status: None   Collection Time    12/05/12  6:25 AM      Result Value Range   C difficile by pcr NEGATIVE  NEGATIVE  CBC WITH DIFFERENTIAL     Status: Abnormal   Collection Time    12/05/12  7:44 AM      Result Value Range   WBC 5.3  4.0 - 10.5 K/uL   RBC 2.99 (*) 3.87 - 5.11 MIL/uL   Hemoglobin 9.1 (*) 12.0 - 15.0 g/dL   HCT 16.1 (*) 09.6 - 04.5 %   MCV 89.0  78.0 - 100.0 fL   MCH 30.4  26.0 - 34.0 pg   MCHC 34.2  30.0 - 36.0 g/dL   RDW 40.9 (*) 81.1 - 91.4 %   Platelets 213  150 - 400 K/uL   Neutrophils Relative % 46  43 - 77 %   Neutro Abs 2.4  1.7 - 7.7 K/uL   Lymphocytes Relative 38  12 - 46 %   Lymphs Abs 2.0  0.7 - 4.0 K/uL   Monocytes Relative 13 (*) 3 - 12 %   Monocytes Absolute 0.7  0.1 - 1.0 K/uL   Eosinophils Relative 2  0 - 5 %   Eosinophils Absolute 0.1  0.0 - 0.7 K/uL   Basophils Relative 1  0 - 1 %   Basophils Absolute 0.1  0.0 - 0.1 K/uL  COMPREHENSIVE METABOLIC PANEL     Status: Abnormal   Collection Time    12/05/12  7:44 AM      Result Value Range   Sodium 137  135 - 145 mEq/L   Potassium 4.2  3.5 - 5.1 mEq/L   Chloride 105  96 - 112 mEq/L   CO2 22  19 - 32 mEq/L   Glucose, Bld 89  70 - 99 mg/dL   BUN 12  6 - 23 mg/dL   Creatinine, Ser 7.82  0.50 - 1.10 mg/dL   Calcium 9.1  8.4 - 95.6 mg/dL   Total Protein 6.1  6.0 - 8.3 g/dL   Albumin 3.2 (*) 3.5 - 5.2 g/dL   AST 35  0 - 37 U/L   ALT 31  0 - 35 U/L   Alkaline  Phosphatase 81  39 - 117 U/L   Total Bilirubin 0.2 (*) 0.3 - 1.2 mg/dL   GFR calc non Af Amer 86 (*) >90 mL/min   GFR calc Af Amer >90  >90 mL/min   Comment: (NOTE)     The eGFR has been calculated using the CKD EPI equation.     This calculation has not been validated in all clinical situations.     eGFR's persistently <90 mL/min signify possible Chronic Kidney     Disease.  TYPE AND SCREEN     Status: None   Collection Time    12/05/12  7:44 AM      Result Value Range   ABO/RH(D) A POS     Antibody Screen NEG     Sample Expiration 12/08/2012    ABO/RH     Status: None   Collection Time    12/05/12  7:44 AM      Result Value Range   ABO/RH(D) A POS    CBC     Status: Abnormal   Collection Time    12/06/12  6:25 AM      Result Value Range   WBC 4.1  4.0 - 10.5 K/uL   RBC 2.91 (*) 3.87 - 5.11 MIL/uL   Hemoglobin 8.9 (*) 12.0 - 15.0 g/dL   HCT 95.2 (*) 84.1 - 32.4 %   MCV 90.4  78.0 - 100.0 fL   MCH 30.6  26.0 - 34.0 pg   MCHC 33.8  30.0 - 36.0 g/dL   RDW 40.1 (*) 02.7 - 25.3 %   Platelets 217  150 - 400 K/uL  BASIC METABOLIC PANEL     Status: Abnormal   Collection Time    12/06/12  6:25 AM      Result Value Range   Sodium 137  135 - 145 mEq/L   Potassium 3.9  3.5 - 5.1 mEq/L   Chloride 106  96 - 112 mEq/L   CO2 21  19 - 32 mEq/L   Glucose, Bld 88  70 - 99 mg/dL   BUN 7   6 - 23 mg/dL   Creatinine, Ser 6.64  0.50 - 1.10 mg/dL   Calcium 8.9  8.4 - 40.3 mg/dL   GFR calc non Af Amer 86 (*) >90 mL/min   GFR calc Af Amer >90  >90 mL/min   Comment: (NOTE)     The eGFR has been calculated using the CKD EPI equation.     This calculation has not been validated in all clinical situations.     eGFR's persistently <90 mL/min signify possible Chronic Kidney     Disease.  SURGICAL PCR SCREEN     Status: None   Collection Time    12/06/12  6:45 PM      Result Value Range   MRSA, PCR NEGATIVE  NEGATIVE   Staphylococcus aureus NEGATIVE  NEGATIVE   Comment:            The Xpert SA Assay (FDA     approved for NASAL specimens     in patients over 69 years of age),     is one component of     a comprehensive surveillance     program.  Test performance has     been validated by [employer] for patients greater     than or equal to 50 year old.     It is not intended     to diagnose infection  nor to     guide or monitor treatment.  CBC     Status: Abnormal   Collection Time    12/07/12  4:07 AM      Result Value Range   WBC 6.8  4.0 - 10.5 K/uL   RBC 2.93 (*) 3.87 - 5.11 MIL/uL   Hemoglobin 8.9 (*) 12.0 - 15.0 g/dL   HCT 40.9 (*) 81.1 - 91.4 %   MCV 90.4  78.0 - 100.0 fL   MCH 30.4  26.0 - 34.0 pg   MCHC 33.6  30.0 - 36.0 g/dL   RDW 78.2 (*) 95.6 - 21.3 %   Platelets 241  150 - 400 K/uL    Studies/Results: No results found. Medications: Scheduled Meds: . acidophilus  1 capsule Oral Daily  . alvimopan  12 mg Oral On Call to OR  . cefoTEtan (CEFOTAN) IV  2 g Intravenous On Call to OR  . cholecalciferol  2,000 Units Oral Q breakfast  . lisinopril  10 mg Oral Daily  . multivitamin with minerals  1 tablet Oral Q breakfast   Continuous Infusions: . sodium chloride Stopped (12/05/12 1500)   PRN Meds:.acetaminophen, acetaminophen, ondansetron (ZOFRAN) IV, ondansetron  Assessment/Plan:  Principal Problem:  GI bleed - appears to be coming from a  transverse colonic mass consistent with colon carcinoma.  Appreciate help from Dr. Charlott Rakes and general surgery.  Plans are for partial colectomy today with the anastomosis.  Patient understanding of the procedure.  We'll draw a preoperative CEA level Active Problems:  Pacemaker implanted 08/28/12 - aware  Essential hypertension, benign - ok  Anemia - hemoglobin 8.9 - stable   LOS: 2 days   Pearla Dubonnet, MD 12/07/2012, 5:29 AM

## 2012-12-07 NOTE — Progress Notes (Signed)
Spoke with Dr. Gypsy Balsam, regarding whether to place PIV in left hand/arm.  Pt did inform me that she had lumpectomy with 1-2 nodes removed, however Dr. Gypsy Balsam gave approval of new IV in that arm.   DA

## 2012-12-07 NOTE — Transfer of Care (Signed)
Immediate Anesthesia Transfer of Care Note  Patient: Tamara [name]  Procedure(s) Performed: Procedure(s): LAPAROSCOPIC-ASSISTED TRANSVERSE COLECTOMY (N/A)  Patient Location: PACU  Anesthesia Type:General  Level of Consciousness: awake, alert  and oriented  Airway & Oxygen Therapy: Patient connected to face mask oxygen  Post-op Assessment: Report given to PACU RN  Post vital signs: stable  Complications: No apparent anesthesia complications

## 2012-12-07 NOTE — Progress Notes (Signed)
Pt BP 172/75. Paged on-call MD to notify. Will continue to monitor.

## 2012-12-07 NOTE — H&P (View-Only) (Signed)
Pt seen and questions answered.The procedure was discussed with the patient.  Laparoscopic partial colectomy discussed with the patient as well as non operative treatments. The risks of operative management include bleeding,  Infection,  Leak of anastamosis,  Ostomy formation, open procedure,  Sepsis,  Abcess,  Hernia,  DVT,  Pulmonary complications,  Cardiovascular  complications,  Injury to ureter,  Bladder,kidney,and anesthesia risks,  And death. The patient understands.  Questions answered.   The success of the procedure is 50-85 % for treating the patients symptoms. They agree to proceed.

## 2012-12-07 NOTE — Anesthesia Postprocedure Evaluation (Signed)
  Anesthesia Post-op Note  Patient: Tamara [name]  Procedure(s) Performed: Procedure(s): LAPAROSCOPIC-ASSISTED TRANSVERSE COLECTOMY (N/A)  Patient Location: PACU  Anesthesia Type:General  Level of Consciousness: awake  Airway and Oxygen Therapy: Patient Spontanous Breathing  Post-op Pain: mild  Post-op Assessment: Post-op Vital signs reviewed, Patient's Cardiovascular Status Stable, Respiratory Function Stable, Patent Airway, No signs of Nausea or vomiting and Pain level controlled  Post-op Vital Signs: Reviewed and stable  Complications: No apparent anesthesia complications

## 2012-12-07 NOTE — Telephone Encounter (Signed)
Pt cx 11-18 will call back to reschedule

## 2012-12-07 NOTE — Anesthesia Preprocedure Evaluation (Addendum)
Anesthesia Evaluation  Patient identified by MRN, date of birth, ID band Patient awake    Reviewed: Allergy & Precautions, H&P , NPO status , Patient's Chart, lab work & pertinent test results, reviewed documented beta blocker date and time   Airway Mallampati: I TM Distance: >3 FB Neck ROM: Full    Dental   Pulmonary  breath sounds clear to auscultation        Cardiovascular hypertension, Pt. on medications + dysrhythmias + pacemaker Rhythm:Regular Rate:Normal  Syncopal spells prior to pacemaker   Neuro/Psych    GI/Hepatic   Endo/Other    Renal/GU      Musculoskeletal   Abdominal   Peds  Hematology  (+) anemia ,   Anesthesia Other Findings   Reproductive/Obstetrics                        Anesthesia Physical Anesthesia Plan  ASA: III  Anesthesia Plan: General   Post-op Pain Management:    Induction: Intravenous  Airway Management Planned: Oral ETT  Additional Equipment:   Intra-op Plan:   Post-operative Plan: Extubation in OR  Informed Consent: I have reviewed the patients History and Physical, chart, labs and discussed the procedure including the risks, benefits and alternatives for the proposed anesthesia with the patient or authorized representative who has indicated his/her understanding and acceptance.     Plan Discussed with: CRNA and Surgeon  Anesthesia Plan Comments:         Anesthesia Quick Evaluation

## 2012-12-07 NOTE — Anesthesia Procedure Notes (Signed)
Procedure Name: Intubation Date/Time: 12/07/2012 11:51 AM Performed by: Ellin Goodie Pre-anesthesia Checklist: Patient identified, Emergency Drugs available, Suction available, Patient being monitored and Timeout performed Patient Re-evaluated:Patient Re-evaluated prior to inductionOxygen Delivery Method: Circle system utilized Preoxygenation: Pre-oxygenation with 100% oxygen Intubation Type: IV induction Ventilation: Mask ventilation without difficulty Laryngoscope Size: Mac and 3 Grade View: Grade III Tube type: Oral Tube size: 7.5 mm Number of attempts: 1 Airway Equipment and Method: Stylet Placement Confirmation: ETT inserted through vocal cords under direct vision,  positive ETCO2 and breath sounds checked- equal and bilateral Secured at: 21 cm Tube secured with: Tape Comments: Atraumatic induction and intubation with MAC 3 blade.  Dr. Gypsy Balsam verified placement of ETT.  Carlynn Herald, CRNA

## 2012-12-07 NOTE — Progress Notes (Signed)
Pt seen and questions answered.The procedure was discussed with the patient.  Laparoscopic partial colectomy discussed with the patient as well as non operative treatments. The risks of operative management include bleeding,  Infection,  Leak of anastamosis,  Ostomy formation, open procedure,  Sepsis,  Abcess,  Hernia,  DVT,  Pulmonary complications,  Cardiovascular  complications,  Injury to ureter,  Bladder,kidney,and anesthesia risks,  And death. The patient understands.  Questions answered.   The success of the procedure is 50-85 % for treating the patients symptoms. They agree to proceed. 

## 2012-12-07 NOTE — Brief Op Note (Signed)
12/05/2012 - 12/07/2012  3:02 PM  PATIENT:  Tamara [name]  79 y.o. female  PRE-OPERATIVE DIAGNOSIS:  NONOBSTRUCTIVE TRANSVERSE COLON MASS  POST-OPERATIVE DIAGNOSIS:  NONOBSTRUCTIVE TRANSVERSE COLON MASS  PROCEDURE:  Procedure(s): LAPAROSCOPIC-ASSISTED TRANSVERSE COLECTOMY (N/A)  SURGEON:  Surgeon(s) and Role:    * Logann Whitebread A. Sade Hollon, MD - Primary    }   ANESTHESIA:   general  EBL:  Total I/O In: 1550 [I.V.:1300; IV Piggyback:250] Out: 250 [Urine:200; Blood:50]  BLOOD ADMINISTERED:none  DRAINS: none   LOCAL MEDICATIONS USED:  NONE  SPECIMEN:  Source of Specimen:  transverse,  descending colon  DISPOSITION OF SPECIMEN:  PATHOLOGY  COUNTS:  YES  TOURNIQUET:  * No tourniquets in log *  DICTATION: .Other Dictation: Dictation Number [id number]  PLAN OF CARE: Admit to inpatient   PATIENT DISPOSITION:  PACU - hemodynamically stable.   Delay start of Pharmacological VTE agent (>24hrs) due to surgical blood loss or risk of bleeding: no

## 2012-12-07 NOTE — Interval H&P Note (Signed)
History and Physical Interval Note:  12/07/2012 11:08 AM  Tamara [name]  has presented today for surgery, with the diagnosis of mass  The various methods of treatment have been discussed with the patient and family. After consideration of risks, benefits and other options for treatment, the patient has consented to  Procedure(s): LAPAROSCOPIC PARTIAL COLECTOMY (N/A) as a surgical intervention .  The patient's history has been reviewed, patient examined, no change in status, stable for surgery.  I have reviewed the patient's chart and labs.  Questions were answered to the patient's satisfaction.     Tamora Huneke A.

## 2012-12-08 DIAGNOSIS — K6389 Other specified diseases of intestine: Secondary | ICD-10-CM | POA: Diagnosis present

## 2012-12-08 MED ORDER — HYDROMORPHONE HCL 2 MG PO TABS
1.0000 mg | ORAL_TABLET | ORAL | Status: DC | PRN
  Administered 2012-12-09: 1 mg via ORAL
  Filled 2012-12-08: qty 1

## 2012-12-08 MED ORDER — TEMAZEPAM 7.5 MG PO CAPS
7.5000 mg | ORAL_CAPSULE | Freq: Once | ORAL | Status: AC
  Administered 2012-12-08: 22:00:00 7.5 mg via ORAL
  Filled 2012-12-08: qty 1

## 2012-12-08 MED ORDER — MENTHOL 3 MG MT LOZG
1.0000 | LOZENGE | OROMUCOSAL | Status: DC | PRN
  Filled 2012-12-08: qty 9

## 2012-12-08 NOTE — Op Note (Signed)
NAMESHADAVIA, DAMPIER              ACCOUNT NO.:  [id number]  MEDICAL RECORD NO.:  [id number]  LOCATION:  5W10C                        FACILITY:  [facility]  PHYSICIAN:  Maisie Fus A. Barnes Florek, M.D.DATE OF BIRTH:  21-May-1929  DATE OF PROCEDURE:  12/07/2012 DATE OF DISCHARGE:                              OPERATIVE REPORT   PREOPERATIVE DIAGNOSIS:  Partially obstructing transverse colon mass.  POSTOPERATIVE DIAGNOSIS:  Partially obstructing transverse colon mass.  PROCEDURE: 1. Laparoscopic-assisted resection of transverse colon and descending     colon. 2. Mobilization of the splenic flexure.  SURGEON:  Maisie Fus A. Elton Heid, M.D.  ANESTHESIA:  General endotracheal anesthesia.  EBL:  30 mL.  IV FLUIDS:  1000 of crystalloid and 250 of albumin.  SPECIMEN:  Transverse colon, splenic flexure, and descending colon.  All to pathology.  INDICATIONS FOR PROCEDURE:  The patient is an 79 year old female with anemia.  She underwent colonoscopy which showed a near obstructing mass of the transverse colon, concerning for malignancy.  We discussed options of resection of this, given the fact that she is anemic and that is concerning for colon cancer.  Risk of bleeding, infection, anastomotic breakdown, need for colostomy, intraabdominal abscess, sepsis, death, DVT, stroke, myocardial infarction, exacerbation underlying medical problems were all discussed with the patient at the bedside.  We discussed all the above, and she wished to proceed.  DESCRIPTION OF PROCEDURE:  The patient when in the holding area, questions were answered.  She was taken back to the operating room and placed supine on the OR table.  After induction of general anesthesia, right arm was tucked.  Foley catheter was placed.  The abdomen is prepped and draped in sterile fashion.  She received preoperative antibiotics.  Time-out was done.  A 5-mm Optiview port was placed in the right upper quadrant.  Pneumoperitoneum was created  to 50 mmHg of CO2 and laparoscope was placed.  Upon inspection, there was some right upper quadrant adhesions from previous open cholecystectomy.  We then placed an additional right upper quadrant 5 mm port, a left lower quadrant 5 mm port, and addition of suprapubic 5 mm port.  The tattoo was easily seen in the transverse colon.  We then began to mobilize the left colon using Harmonic scalpel.  The splenic flexure was then taken down as well with the Harmonic scalpel.  Care taken not to injure the spleen.  We then mobilized the distal transverse colon to the level of the middle colic vessels in its entirety.  It was very redundant and quite floppy.  I then placed a hand port using a 7 cm midline incision just above the umbilicus.  I was able to place my hand and do a little more mobilization that way.  The specimen was easily excised.  I then went ahead into the port itself, pulled out the transverse colon, splenic flexure, and descending colon.  I then chose an area for section just proximal to the mass which overlie the middle colic vessel.  We used a GIA-75 stapling device.  I then chose an area distal to the splenic flexure, since I wish to avoid the watershed zone of the splenic flexure.  This was stapled across using  a GIA-75 stapling device. LigaSure was __________ take down the mesentery in its entirety down through the mesentery.  Specimens passed off the field and oriented.  We then constructed an initial side-to-side functional end-to-end anastomosis between the proximal transverse colon, which reached easily across the midline down to the descending colon.  This was done using a GIA-75 stapling device and TA-60 device.  The distal part of the colon anastomosis though looked ischemic to me and I felt that I need to go little further down streams.  I went ahead and resected this anastomosis using a GIA-75 stapling device x2.  We then recreated the anastomosis in a similar  fashion using the GIA-75 stapling device and TA-60.  I oversewed the common enterotomy staple line with a 2-0 Vicryl.  This looked a lot better and did not have a dusky appearance of the distal part of the anastomosis as was present before.  I irrigated out the abdominal cavity.  There was no tension at the anastomosis; and I laid quite nicely in the left lower quadrant.  Irrigation was used and suctioned out.  Hemostasis was achieved.  I went ahead and closed the fascia with #1 PDS and skin incision with staples after removal of the port and passing all dirty instruments off the field prior to closing, changing gloves and gowns.  I then reinserted the laparoscope. Anastomosis looked hemostatic.  Did not see any significant undue tension upon it.  There was no evidence of any bleeding from the splenic region or hepatic flexure.  At this point, Doucette removed our ports and passed CO2 off the field.  Staples used to close the port sites.  All final counts of sponge, instrument, needle were found to be correct this portion of the case.  The patient was awoke, extubated, taken to recovery in satisfactory condition.  EBL was 50 mL.     Krista Som A. Dick Hark, M.D.     TAC/MEDQ  D:  12/07/2012  T:  12/08/2012  Job:  [id number]

## 2012-12-08 NOTE — Progress Notes (Signed)
Try clears.  Looks ok oob D/C  FOLEY

## 2012-12-08 NOTE — Progress Notes (Signed)
Subjective: Patient is alert and oriented.  She had a lot of pain through the night but it has been fairly well treated.  No flatulence.  Denies nausea or vomiting   Objective: Weight change:   Intake/Output Summary (Last 24 hours) at 12/08/12 0803 Last data filed at 12/08/12 0144  Gross per 24 hour  Intake   1550 ml  Output   1000 ml  Net    550 ml   Filed Vitals:   12/07/12 1715 12/07/12 1730 12/07/12 2139 12/08/12 0358  BP: 146/60 139/58 137/98 161/84  Pulse: 72 70 85 84  Temp:  97.5 F (36.4 C) 97.7 F (36.5 C) 98.4 F (36.9 C)  TempSrc:      Resp: 11 13 16 18   Height:      Weight:      SpO2: 99% 100% 100% 98%    General Appearance: Alert, cooperative, no distress, appears stated age Lungs: Clear to auscultation bilaterally, respirations unlabored Heart: Regular rate and rhythm, S1 and S2 normal, no murmur, rub or gallop Abdomen: Midline surgical site is dry as are other surgical sites.  Patient has bowel sounds in all quadrants.  No distention  Extremities: Extremities normal, atraumatic, no cyanosis or edema Neuro: Alert and oriented x3, nonfocal  Lab Results: Results for orders placed during the hospital encounter of 12/05/12 (from the past 48 hour(s))  SURGICAL PCR SCREEN     Status: None   Collection Time    12/06/12  6:45 PM      Result Value Range   MRSA, PCR NEGATIVE  NEGATIVE   Staphylococcus aureus NEGATIVE  NEGATIVE   Comment:            The Xpert SA Assay (FDA     approved for NASAL specimens     in patients over 77 years of age),     is one component of     a comprehensive surveillance     program.  Test performance has     been validated by [employer] for patients greater     than or equal to 47 year old.     It is not intended     to diagnose infection nor to     guide or monitor treatment.  CBC     Status: Abnormal   Collection Time    12/07/12  4:07 AM      Result Value Range   WBC 6.8  4.0 - 10.5 K/uL   RBC 2.93 (*) 3.87 - 5.11  MIL/uL   Hemoglobin 8.9 (*) 12.0 - 15.0 g/dL   HCT 09.8 (*) 11.9 - 14.7 %   MCV 90.4  78.0 - 100.0 fL   MCH 30.4  26.0 - 34.0 pg   MCHC 33.6  30.0 - 36.0 g/dL   RDW 82.9 (*) 56.2 - 13.0 %   Platelets 241  150 - 400 K/uL  CEA     Status: None   Collection Time    12/07/12  6:42 AM      Result Value Range   CEA 1.3  0.0 - 5.0 ng/mL   Comment: Performed at [facility]    Studies/Results: No results found. Medications: Scheduled Meds: . alvimopan  12 mg Oral BID  . enoxaparin (LOVENOX) injection  40 mg Subcutaneous Q24H  . fentaNYL  50-100 mcg Intravenous Once  . lisinopril  10 mg Oral Daily   Continuous Infusions: . sodium chloride 20 mL/hr at 12/07/12 0738  .  dextrose 5% lactated ringers 100 mL/hr at 12/07/12 2240   PRN Meds:.acetaminophen, acetaminophen, fentaNYL, midazolam, ondansetron (ZOFRAN) IV, ondansetron  Assessment/Plan: Principal Problem:   GI bleed - likely secondary to colonic mass, resected.  As per surgery.  Check CBC in a.m. Active Problems:   Pacemaker implanted 08/28/12 - possible malfunction at times, Dr. Lewayne Bunting to consult   Essential hypertension, benign - blood pressure mildly elevated systolically, likely secondary to pain   D (diarrhea) - resolved with surgery   Colonic mass - surgical pathology pending.  Biopsies from colonoscopy revealed dysplasia   Start incentive spirometry    LOS: 3 days   Pearla Dubonnet, MD 12/08/2012, 8:03 AM

## 2012-12-08 NOTE — Evaluation (Signed)
Physical Therapy Evaluation Patient Details Name: Tamara [name] MRN: [id number] DOB: 1930-01-20 Today's Date: 12/08/2012 Time: 4098-1191 PT Time Calculation (min): 40 min  PT Assessment / Plan / Recommendation History of Present Illness  79 y/o WF admitted with GI bleed and s/p partialcolectomy after sigmoidoscopy revealed mass consistent with malignancy.  Clinical Impression  Pt ambulating at MIN/guard level 15' with RW.  Pt would like to go home with HHPT and husband support. Recommend RW for home use.  Will follow pt acutely to work towards returning to independent level of function.    PT Assessment  Patient needs continued PT services    Follow Up Recommendations  Home health PT    Does the patient have the potential to tolerate intense rehabilitation      Barriers to Discharge   split level house (3 steps to bedroom). Pt reports no other support besides husband.    Equipment Recommendations  Rolling walker with 5" wheels    Recommendations for Other Services     Frequency Min 3X/week    Precautions / Restrictions Precautions Precaution Comments: Abd surgery   Pertinent Vitals/Pain 4/10 abd pain. RN made aware      Mobility  Bed Mobility Bed Mobility: Rolling Right;Right Sidelying to Sit Rolling Right: 4: Min guard;With rail Right Sidelying to Sit: 4: Min assist Details for Bed Mobility Assistance: cues for body mechanics.  MIN A to get trunk upright Transfers Transfers: Sit to Stand;Stand to Sit Sit to Stand: 4: Min guard Stand to Sit: 4: Min guard Ambulation/Gait Ambulation/Gait Assistance: 4: Min guard Ambulation Distance (Feet): 15 Feet Assistive device: Rolling walker Ambulation/Gait Assistance Details: Guarded gait due to abd pain Gait velocity: decreased    Exercises     PT Diagnosis: Acute pain;Difficulty walking  PT Problem List: Decreased activity tolerance;Decreased mobility;Decreased knowledge of use of DME PT Treatment Interventions: Gait  training;Stair training;Functional mobility training;Therapeutic activities;Therapeutic exercise;Patient/family education     PT Goals(Current goals can be found in the care plan section) Acute Rehab PT Goals Patient Stated Goal: To get stronger and go home PT Goal Formulation: With patient Time For Goal Achievement: 12/22/12 Potential to Achieve Goals: Good  Visit Information  Last PT Received On: 12/08/12 Assistance Needed: +1 History of Present Illness: 79 y/o WF admitted with GI bleed and s/p partialcolectomy after sigmoidoscopy revealed mass consistent with malignancy.       Prior Functioning  Home Living Family/patient expects to be discharged to:: Private residence Living Arrangements: Spouse/significant other Type of Home: House Home Access: Level entry Home Layout: Multi-level Alternate Level Stairs-Number of Steps: 3 steps from kitchen level to BR level Home Equipment: None Prior Function Level of Independence: Independent    Cognition  Cognition Arousal/Alertness: Awake/alert Behavior During Therapy: WFL for tasks assessed/performed Overall Cognitive Status: Within Functional Limits for tasks assessed    Extremity/Trunk Assessment Lower Extremity Assessment Lower Extremity Assessment: Overall WFL for tasks assessed Cervical / Trunk Assessment Cervical / Trunk Assessment: Normal   Balance Balance Balance Assessed: Yes Static Sitting Balance Static Sitting - Balance Support: No upper extremity supported Static Sitting - Level of Assistance: 5: Stand by assistance  End of Session PT - End of Session Equipment Utilized During Treatment: Gait belt Activity Tolerance: Patient tolerated treatment well Patient left: in chair;with call bell/phone within reach Nurse Communication: Mobility status;Patient requests pain meds  GP     Tamara [name] 12/08/2012, 11:24 AM

## 2012-12-08 NOTE — Care Management Note (Signed)
  Page 2 of 2   12/27/2012     10:26:35 AM   CARE MANAGEMENT NOTE 12/27/2012  Patient:  Tamara [name], Tamara [name]   Account Number:  [id number]  Date Initiated:  12/08/2012  Documentation initiated by:  Letha Cape  Subjective/Objective Assessment:   dx gib  admit- lives with spouse.     Action/Plan:   11/13- s/p colectomy  pt eval- rec hhpt and rolling walker  TPN   Anticipated DC Date:  12/22/2012   Anticipated DC Plan:  SKILLED NURSING FACILITY  In-house referral  Clinical Social Worker      DC Planning Services  CM consult      Choice offered to / List presented to:             Status of service:  In process, will continue to follow Medicare Important Message given?   (If response is "NO", the following Medicare IM given date fields will be blank) Date Medicare IM given:   Date Additional Medicare IM given:    Discharge Disposition:    Per UR Regulation:  Reviewed for med. necessity/level of care/duration of stay  If discussed at Long Length of Stay Meetings, dates discussed:   12/12/2012  12/14/2012  12/19/2012    Comments:  12-27-12 Confirmed with Aris [place] PA discharge plan is for SNF . Irving Burton with SW aware. Ronny Flurry RN BSN [phone]    12-26-12 Patient transferred from another unit. Spoke with patient she would like to go to SNF short term for rehab and then go home.  Paged PA to discuss LTAC . Understand plan is to wean TNA . Left message for [facility] rep , previous case manager reported Kathlene November with [facility] would reacess patient in morning . Awaiting call back from surgery . Ronny Flurry RN BSN [phone]   12-25-12 2:30pm Avie Arenas, RNBSN [phone] Post op in ICU for exp lap on 11-28.  Tx to intermediate unit today.  Continues on TNA - Would be good Ltach candidate prior to going home.  Sticky note left for physicians.  CM will continue to follow.  12/19/12 15:06 Letha Cape RN, BSN [phone] patient conts with NG tube and TPN, abd sl distended, conts with  ileus.  NCM spoke to MD about Ltac  for patient.  12/14/12 12:12 Letha Cape RN, BSN [phone] patient still with abd pain, and distended, ct shows possible obstruction at the anstomotic site, patient still has not passed flatus, no bowel sounds, per surgery , they may have to do repeat surgery if anastomosis site is not patent.  12/08/12 16:02 Letha Cape RN, BSN [phone] patient is pod 1 for colectomy, patient lives with spouse, per physical therapy recs hhpt and rolling walker. I spoke with patient and she would like to speak with her spouse about the HHPT and rolling walker before she chooses .  I left the agency list in the room with the patient and explained to her that another NCM will be here over the weekend.

## 2012-12-08 NOTE — Progress Notes (Signed)
LOS: 3 days   Subjective: POD1 Patient complains of abdominal pain with moving around, otherwise pain is well tolerated with pain medication. She complains about a dry mouth and pain in her throat due to endotracheal tube following surgery. She denies any fever, chills, nausea or vomiting. No flatus. No BM.  Objective: Vital signs in last 24 hours: Temp:  [97.5 F (36.4 C)-98.4 F (36.9 C)] 98.4 F (36.9 C) (11/14 0358) Pulse Rate:  [62-85] 84 (11/14 0358) Resp:  [11-22] 18 (11/14 0358) BP: (136-181)/(58-98) 161/84 mmHg (11/14 0358) SpO2:  [97 %-100 %] 98 % (11/14 0358) Last BM Date: 12/06/12   Laboratory  CBC  Recent Labs  12/06/12 0625 12/07/12 0407  WBC 4.1 6.8  HGB 8.9* 8.9*  HCT 26.3* 26.5*  PLT 217 241   BMET  Recent Labs  12/06/12 0625  NA 137  K 3.9  CL 106  CO2 21  GLUCOSE 88  BUN 7  CREATININE 0.54  CALCIUM 8.9     Physical Exam General appearance: alert, cooperative and no distress Resp: clear to auscultation bilaterally Cardio: regular rate and rhythm GI: +BS, soft, diffuse tenderness, no distension. Midline surgical wound clean and dry.  Extremities: extremities normal, atraumatic, no cyanosis or edema Neuro: grossly normal    Assessment/Plan: Non-obstructive colonic mass in transverse colon POD 1 S/P laparoscopic assisted transverse colectomy: patient recovering well; advance to clears as tolerated; pain control; biopsies reveal high grade dysplasia; surgical pathology pending.   Maris Berger, PA-S General Surgery   12/08/2012

## 2012-12-08 NOTE — Progress Notes (Signed)
Pt requesting sleep aide, Dr. Kevan Ny notified verbal telephone order given to give Restoril 7.5mg  PO if first dose is ineffective repeat restoril 7.5mg  PO. If both doses are ineffective then give zaleplon 10mg  PO. Will continue to monitor and assist as needed. Julien Nordmann [facility]

## 2012-12-09 ENCOUNTER — Inpatient Hospital Stay (HOSPITAL_COMMUNITY): Payer: Medicare Other

## 2012-12-09 LAB — CBC WITH DIFFERENTIAL/PLATELET
Basophils Absolute: 0 10*3/uL (ref 0.0–0.1)
Basophils Relative: 0 % (ref 0–1)
Eosinophils Relative: 1 % (ref 0–5)
HCT: 27.6 % — ABNORMAL LOW (ref 36.0–46.0)
Hemoglobin: 9.5 g/dL — ABNORMAL LOW (ref 12.0–15.0)
Lymphocytes Relative: 11 % — ABNORMAL LOW (ref 12–46)
MCHC: 34.4 g/dL (ref 30.0–36.0)
Monocytes Absolute: 0.9 10*3/uL (ref 0.1–1.0)
Monocytes Relative: 9 % (ref 3–12)
Neutro Abs: 7.8 10*3/uL — ABNORMAL HIGH (ref 1.7–7.7)
WBC: 9.9 10*3/uL (ref 4.0–10.5)

## 2012-12-09 LAB — BASIC METABOLIC PANEL
BUN: <3 mg/dL — ABNORMAL LOW (ref 6–23)
CO2: 25 mEq/L (ref 19–32)
Calcium: 9.5 mg/dL (ref 8.4–10.5)
Chloride: 100 mEq/L (ref 96–112)
Creatinine, Ser: 0.43 mg/dL — ABNORMAL LOW (ref 0.50–1.10)
Potassium: 3.2 mEq/L — ABNORMAL LOW (ref 3.5–5.1)

## 2012-12-09 MED ORDER — KCL IN DEXTROSE-NACL 20-5-0.45 MEQ/L-%-% IV SOLN
INTRAVENOUS | Status: DC
  Administered 2012-12-09 (×2): via INTRAVENOUS
  Filled 2012-12-09 (×4): qty 1000

## 2012-12-09 MED ORDER — POTASSIUM CHLORIDE 10 MEQ/100ML IV SOLN
10.0000 meq | INTRAVENOUS | Status: AC
  Administered 2012-12-09 (×3): 10 meq via INTRAVENOUS
  Filled 2012-12-09 (×3): qty 100

## 2012-12-09 NOTE — Progress Notes (Signed)
2 Days Post-Op   Assessment: s/p Procedure(s): LAPAROSCOPIC-ASSISTED TRANSVERSE COLECTOMY Patient Active Problem List   Diagnosis Date Noted  . Colonic mass 12/08/2012  . D (diarrhea) 12/06/2012  . GI bleed 12/05/2012  . Essential hypertension, benign 11/27/2012  . Colitis 11/26/2012  . Nausea and vomiting 11/26/2012  . LLQ abdominal pain 11/26/2012  . Diastolic dysfunction- 2 09/06/2012  . Syncope 08/30/2012  . Pacemaker implanted 08/28/12 08/30/2012  . Cancer of breast-  08/30/2012  . Elevated blood pressure 08/30/2012  . Sinus arrest 08/28/2012    Moderate pain, which I think is from gaseous distention of intestine. Mild hypokalemia  Plan: Will give some supplemental K+, recheck labs in am  Subjective: More pain this am, not passed gas, feels worse than yesterday, no nausea not taking much PO,   Objective: Vital signs in last 24 hours: Temp:  [98 F (36.7 C)-98.7 F (37.1 C)] 98 F (36.7 C) (11/15 0622) Pulse Rate:  [90-93] 92 (11/15 0622) Resp:  [18-20] 20 (11/15 0622) BP: (183-197)/(74-81) 183/81 mmHg (11/15 0622) SpO2:  [96 %-97 %] 97 % (11/15 0622)   Intake/Output from previous day: 11/14 0701 - 11/15 0700 In: 1500 [P.O.:300; I.V.:1200] Out: 2200 [Urine:2200]  General appearance: alert, cooperative and mild distress Resp: clear to auscultation bilaterally GI: Distended, tender  more on right, but fairly soft right now, few BS+ (more heard earlier by Dr Kevan Ny  Incision: healing well  Lab Results:   Recent Labs  12/07/12 0407 12/09/12 0635  WBC 6.8 9.9  HGB 8.9* 9.5*  HCT 26.5* 27.6*  PLT 241 252   BMET  Recent Labs  12/09/12 0635  NA 135  K 3.2*  CL 100  CO2 25  GLUCOSE 134*  BUN <3*  CREATININE 0.43*  CALCIUM 9.5    MEDS, Scheduled . alvimopan  12 mg Oral BID  . enoxaparin (LOVENOX) injection  40 mg Subcutaneous Q24H  . fentaNYL  50-100 mcg Intravenous Once  . lisinopril  10 mg Oral Daily    Studies/Results: Dg Abd Portable  1v  12/09/2012   CLINICAL DATA:  Severe lower abdominal pain status post surgery.  EXAM: PORTABLE ABDOMEN - 1 VIEW  COMPARISON:  CT abdomen 11/26/2012.  FINDINGS: There is gaseous distention of the small bowel and colon. . There is no pneumoperitoneum or portal venous gas. There is no pneumatosis. There is no bowel wall thickening. There are no pathologic calcifications. Surgical clips in the right upper quadrant are noted from prior cholecystectomy. There are cutaneous surgical staples along the midline.  There are severe degenerative changes of the right hip.  IMPRESSION: Gaseous distention of small bowel: Most consistent with postoperative ileus given the patient's history.   Electronically Signed   By: Elige Ko   On: 12/09/2012 09:27      LOS: 4 days     Currie Paris, MD, [facility], [place] [phone]   12/09/2012 11:37 AM

## 2012-12-09 NOTE — Progress Notes (Signed)
Subjective: Patient slept relatively well with small dose of Restoril last night but this morning is having very intense abdominal pain.  Feels more gassy and crampy.  Also feeling nausea  Objective: Weight change:   Intake/Output Summary (Last 24 hours) at 12/09/12 0818 Last data filed at 12/09/12 0700  Gross per 24 hour  Intake   1500 ml  Output   2200 ml  Net   -700 ml   Filed Vitals:   12/08/12 1434 12/08/12 2106 12/08/12 2146 12/09/12 0622  BP: 197/74 195/76 183/76 183/81  Pulse: 93 92 90 92  Temp: 98.2 F (36.8 C) 98.7 F (37.1 C)  98 F (36.7 C)  TempSrc: Oral Oral  Oral  Resp: 18 18  20   Height:      Weight:      SpO2: 96% 97%  97%    General Appearance: Alert, obviously in pain and distress but not tachypneic Lungs: Clear to auscultation bilaterally, respirations unlabored Heart: Regular rate and rhythm, S1 and S2 normal, no murmur, rub or gallop Abdomen: Moderately distended with moderate borborygmi, exquisite tenderness to palpation throughout the abdomen.  No erythema and surgical abdominal wounds appear normal Extremities: Extremities normal, atraumatic, no cyanosis or edema Neuro: Alert and oriented, nonfocal  Lab Results: Results for orders placed during the hospital encounter of 12/05/12 (from the past 48 hour(s))  CBC WITH DIFFERENTIAL     Status: Abnormal   Collection Time    12/09/12  6:35 AM      Result Value Range   WBC 9.9  4.0 - 10.5 K/uL   RBC 3.10 (*) 3.87 - 5.11 MIL/uL   Hemoglobin 9.5 (*) 12.0 - 15.0 g/dL   HCT 16.1 (*) 09.6 - 04.5 %   MCV 89.0  78.0 - 100.0 fL   MCH 30.6  26.0 - 34.0 pg   MCHC 34.4  30.0 - 36.0 g/dL   RDW 40.9 (*) 81.1 - 91.4 %   Platelets 252  150 - 400 K/uL   Neutrophils Relative % 79 (*) 43 - 77 %   Neutro Abs 7.8 (*) 1.7 - 7.7 K/uL   Lymphocytes Relative 11 (*) 12 - 46 %   Lymphs Abs 1.1  0.7 - 4.0 K/uL   Monocytes Relative 9  3 - 12 %   Monocytes Absolute 0.9  0.1 - 1.0 K/uL   Eosinophils Relative 1  0 - 5 %   Eosinophils Absolute 0.1  0.0 - 0.7 K/uL   Basophils Relative 0  0 - 1 %   Basophils Absolute 0.0  0.0 - 0.1 K/uL    Studies/Results: No results found. Medications: Scheduled Meds: . alvimopan  12 mg Oral BID  . enoxaparin (LOVENOX) injection  40 mg Subcutaneous Q24H  . fentaNYL  50-100 mcg Intravenous Once  . lisinopril  10 mg Oral Daily   Continuous Infusions: . sodium chloride 20 mL/hr at 12/07/12 0738  . dextrose 5% lactated ringers 100 mL/hr at 12/09/12 0205   PRN Meds:.acetaminophen, acetaminophen, fentaNYL, HYDROmorphone, menthol-cetylpyridinium, midazolam, ondansetron (ZOFRAN) IV, ondansetron  Assessment/Plan:  Principal Problem: Abdominal pain - fairly intense this morning but may be simply secondary to gas.  Rule out bowel obstruction.  She has not passed flatus yet.  Check abdominal x-ray.  White cell count is normal and hemoglobin is stable GI bleed - likely secondary to colonic mass, resected. As per surgery.  Hemoglobin stable  Active Problems:  Pacemaker implanted 08/28/12 - possible malfunction at times, Dr. Lewayne Bunting to consult  Essential hypertension, benign - blood pressure mildly elevated systolically, likely secondary to pain  D (diarrhea) - resolved with surgery - has not passed flatus yet Colonic mass - surgical pathology pending. Biopsies from colonoscopy revealed dysplasia  Continue incentive spirometry Physical therapy as tolerated FEN - will increase fluids and not feed until abdomen further worked up this morning - need to rule out obstruction   LOS: 4 days   Pearla Dubonnet, MD 12/09/2012, 8:18 AM

## 2012-12-10 ENCOUNTER — Inpatient Hospital Stay (HOSPITAL_COMMUNITY): Payer: Medicare Other

## 2012-12-10 LAB — BASIC METABOLIC PANEL
BUN: 4 mg/dL — ABNORMAL LOW (ref 6–23)
CO2: 23 mEq/L (ref 19–32)
Calcium: 9.4 mg/dL (ref 8.4–10.5)
Creatinine, Ser: 0.42 mg/dL — ABNORMAL LOW (ref 0.50–1.10)
Glucose, Bld: 151 mg/dL — ABNORMAL HIGH (ref 70–99)
Potassium: 3.7 mEq/L (ref 3.5–5.1)

## 2012-12-10 LAB — CBC WITH DIFFERENTIAL/PLATELET
Basophils Absolute: 0 10*3/uL (ref 0.0–0.1)
Basophils Relative: 0 % (ref 0–1)
Eosinophils Relative: 0 % (ref 0–5)
HCT: 28.6 % — ABNORMAL LOW (ref 36.0–46.0)
Hemoglobin: 10 g/dL — ABNORMAL LOW (ref 12.0–15.0)
Lymphocytes Relative: 8 % — ABNORMAL LOW (ref 12–46)
Lymphs Abs: 0.8 10*3/uL (ref 0.7–4.0)
MCHC: 35 g/dL (ref 30.0–36.0)
MCV: 88.3 fL (ref 78.0–100.0)
Monocytes Absolute: 0.7 10*3/uL (ref 0.1–1.0)
Monocytes Relative: 6 % (ref 3–12)
RDW: 15.8 % — ABNORMAL HIGH (ref 11.5–15.5)
WBC: 11.3 10*3/uL — ABNORMAL HIGH (ref 4.0–10.5)

## 2012-12-10 MED ORDER — KCL IN DEXTROSE-NACL 20-5-0.9 MEQ/L-%-% IV SOLN
INTRAVENOUS | Status: AC
  Administered 2012-12-10: 13:00:00 via INTRAVENOUS
  Administered 2012-12-11: 75 mL/h via INTRAVENOUS
  Administered 2012-12-12: 01:00:00 via INTRAVENOUS
  Filled 2012-12-10 (×7): qty 1000

## 2012-12-10 MED ORDER — PHENOL 1.4 % MT LIQD
1.0000 | OROMUCOSAL | Status: DC | PRN
  Administered 2012-12-18 – 2012-12-19 (×3): 1 via OROMUCOSAL
  Filled 2012-12-10 (×2): qty 177

## 2012-12-10 MED ORDER — HYDRALAZINE HCL 20 MG/ML IJ SOLN
5.0000 mg | Freq: Four times a day (QID) | INTRAMUSCULAR | Status: DC | PRN
  Administered 2012-12-10 – 2012-12-11 (×3): 5 mg via INTRAVENOUS
  Filled 2012-12-10 (×4): qty 1

## 2012-12-10 MED ORDER — PROMETHAZINE HCL 25 MG/ML IJ SOLN
6.2500 mg | INTRAMUSCULAR | Status: DC | PRN
  Administered 2012-12-10 – 2012-12-21 (×18): 6.25 mg via INTRAVENOUS
  Filled 2012-12-10 (×21): qty 1

## 2012-12-10 NOTE — Progress Notes (Signed)
Subjective: Tamara [name] is pretty miserable this morning.  Throughout last night once.  Tried some clear liquids this morning and has very intense nausea and a distended abdomen with no bowel sounds.  Clinically has a very intense ileus.  Blood pressure is elevated secondary to abdominal distress  Objective: Weight change:   Intake/Output Summary (Last 24 hours) at 12/10/12 0950 Last data filed at 12/10/12 0617  Gross per 24 hour  Intake 2228.33 ml  Output   1254 ml  Net 974.33 ml   Filed Vitals:   12/09/12 2100 12/09/12 2211 12/10/12 0458 12/10/12 0900  BP: 218/95 187/79 173/83 183/101  Pulse: 95 86 92 98  Temp: 97.9 F (36.6 C)  97.7 F (36.5 C)   TempSrc: Oral  Oral   Resp: 18  18   Height:      Weight:      SpO2: 97%  99%     General Appearance: Alert, obviously in pain and distress but not tachypneic  Lungs: Clear to auscultation bilaterally, respirations unlabored  Heart: Regular rate and rhythm, S1 and S2 normal, no murmur, rub or gallop  Abdomen: Clinically more distended than yesterday and without bowel sounds today, rarely exquisite tenderness to palpation throughout the abdomen. No erythema and surgical abdominal wounds appear normal  Extremities: Extremities normal, atraumatic, no cyanosis or edema  Neuro: Alert and oriented, nonfocal  Lab Results: Results for orders placed during the hospital encounter of 12/05/12 (from the past 48 hour(s))  CBC WITH DIFFERENTIAL     Status: Abnormal   Collection Time    12/09/12  6:35 AM      Result Value Range   WBC 9.9  4.0 - 10.5 K/uL   RBC 3.10 (*) 3.87 - 5.11 MIL/uL   Hemoglobin 9.5 (*) 12.0 - 15.0 g/dL   HCT 21.3 (*) 08.6 - 57.8 %   MCV 89.0  78.0 - 100.0 fL   MCH 30.6  26.0 - 34.0 pg   MCHC 34.4  30.0 - 36.0 g/dL   RDW 46.9 (*) 62.9 - 52.8 %   Platelets 252  150 - 400 K/uL   Neutrophils Relative % 79 (*) 43 - 77 %   Neutro Abs 7.8 (*) 1.7 - 7.7 K/uL   Lymphocytes Relative 11 (*) 12 - 46 %   Lymphs Abs 1.1  0.7  - 4.0 K/uL   Monocytes Relative 9  3 - 12 %   Monocytes Absolute 0.9  0.1 - 1.0 K/uL   Eosinophils Relative 1  0 - 5 %   Eosinophils Absolute 0.1  0.0 - 0.7 K/uL   Basophils Relative 0  0 - 1 %   Basophils Absolute 0.0  0.0 - 0.1 K/uL  BASIC METABOLIC PANEL     Status: Abnormal   Collection Time    12/09/12  6:35 AM      Result Value Range   Sodium 135  135 - 145 mEq/L   Potassium 3.2 (*) 3.5 - 5.1 mEq/L   Chloride 100  96 - 112 mEq/L   CO2 25  19 - 32 mEq/L   Glucose, Bld 134 (*) 70 - 99 mg/dL   BUN <3 (*) 6 - 23 mg/dL   Creatinine, Ser 4.13 (*) 0.50 - 1.10 mg/dL   Calcium 9.5  8.4 - 24.4 mg/dL   GFR calc non Af Amer >90  >90 mL/min   GFR calc Af Amer >90  >90 mL/min   Comment: (NOTE)     The eGFR  has been calculated using the CKD EPI equation.     This calculation has not been validated in all clinical situations.     eGFR's persistently <90 mL/min signify possible Chronic Kidney     Disease.  CBC WITH DIFFERENTIAL     Status: Abnormal   Collection Time    12/10/12  6:30 AM      Result Value Range   WBC 11.3 (*) 4.0 - 10.5 K/uL   RBC 3.24 (*) 3.87 - 5.11 MIL/uL   Hemoglobin 10.0 (*) 12.0 - 15.0 g/dL   HCT 09.3 (*) 23.5 - 57.3 %   MCV 88.3  78.0 - 100.0 fL   MCH 30.9  26.0 - 34.0 pg   MCHC 35.0  30.0 - 36.0 g/dL   RDW 22.0 (*) 25.4 - 27.0 %   Platelets 305  150 - 400 K/uL   Neutrophils Relative % 86 (*) 43 - 77 %   Neutro Abs 9.7 (*) 1.7 - 7.7 K/uL   Lymphocytes Relative 8 (*) 12 - 46 %   Lymphs Abs 0.8  0.7 - 4.0 K/uL   Monocytes Relative 6  3 - 12 %   Monocytes Absolute 0.7  0.1 - 1.0 K/uL   Eosinophils Relative 0  0 - 5 %   Eosinophils Absolute 0.0  0.0 - 0.7 K/uL   Basophils Relative 0  0 - 1 %   Basophils Absolute 0.0  0.0 - 0.1 K/uL  BASIC METABOLIC PANEL     Status: Abnormal   Collection Time    12/10/12  6:30 AM      Result Value Range   Sodium 128 (*) 135 - 145 mEq/L   Comment: DELTA CHECK NOTED   Potassium 3.7  3.5 - 5.1 mEq/L   Chloride 95 (*) 96 -  112 mEq/L   CO2 23  19 - 32 mEq/L   Glucose, Bld 151 (*) 70 - 99 mg/dL   BUN 4 (*) 6 - 23 mg/dL   Creatinine, Ser 6.23 (*) 0.50 - 1.10 mg/dL   Calcium 9.4  8.4 - 76.2 mg/dL   GFR calc non Af Amer >90  >90 mL/min   GFR calc Af Amer >90  >90 mL/min   Comment: (NOTE)     The eGFR has been calculated using the CKD EPI equation.     This calculation has not been validated in all clinical situations.     eGFR's persistently <90 mL/min signify possible Chronic Kidney     Disease.    Studies/Results: Dg Abd Portable 1v  12/09/2012   CLINICAL DATA:  Severe lower abdominal pain status post surgery.  EXAM: PORTABLE ABDOMEN - 1 VIEW  COMPARISON:  CT abdomen 11/26/2012.  FINDINGS: There is gaseous distention of the small bowel and colon. . There is no pneumoperitoneum or portal venous gas. There is no pneumatosis. There is no bowel wall thickening. There are no pathologic calcifications. Surgical clips in the right upper quadrant are noted from prior cholecystectomy. There are cutaneous surgical staples along the midline.  There are severe degenerative changes of the right hip.  IMPRESSION: Gaseous distention of small bowel: Most consistent with postoperative ileus given the patient's history.   Electronically Signed   By: Elige Ko   On: 12/09/2012 09:27   Medications: Scheduled Meds: . alvimopan  12 mg Oral BID  . enoxaparin (LOVENOX) injection  40 mg Subcutaneous Q24H  . fentaNYL  50-100 mcg Intravenous Once  . lisinopril  10 mg Oral Daily  Continuous Infusions: . dextrose 5 % and 0.45 % NaCl with KCl 20 mEq/L 100 mL/hr at 12/10/12 0617   PRN Meds:.acetaminophen, acetaminophen, fentaNYL, HYDROmorphone, menthol-cetylpyridinium, midazolam, ondansetron (ZOFRAN) IV, ondansetron  Assessment/Plan:  Principal Problem:  Abdominal pain - fairly intense this morning and likely secondary to ileus and increased intestinal gas.  Still has not passed flatus. Check abdominal x-ray - may need NG tube  for comfort. White cell count is normal and hemoglobin is stable  GI bleed - likely secondary to colonic mass, resected. As per surgery. Hemoglobin stable  Active Problems:  Pacemaker implanted 08/28/12 - possible malfunction at times, Dr. Lewayne Bunting to consult  Essential hypertension, benign - blood pressure mildly elevated systolically, likely secondary to pain  D (diarrhea) - resolved with surgery - has not passed flatus yet  Colonic mass - will continue to check for surgical pathology results. Biopsies from colonoscopy revealed dysplasia  Continue incentive spirometry  Physical therapy as tolerated  FEN - will increase fluids and only sips of clear liquids until abdomen further worked up this morning.  Again, NG tube may be helpful until flatus nurse.  Try to minimize pain meds when possible   LOS: 5 days   Pearla Dubonnet, MD 12/10/2012, 9:50 AM

## 2012-12-10 NOTE — Progress Notes (Signed)
3 Days Post-Op  Subjective: C/O abdominal distention, emesis X 1 this AM. No flatus. Tolerated clears later this AM.  Objective: Vital signs in last 24 hours: Temp:  [97.4 F (36.3 C)-97.9 F (36.6 C)] 97.7 F (36.5 C) (11/16 0458) Pulse Rate:  [86-98] 98 (11/16 0900) Resp:  [18] 18 (11/16 0458) BP: (173-218)/(78-101) 183/101 mmHg (11/16 0900) SpO2:  [97 %-99 %] 99 % (11/16 0458) Last BM Date: 12/06/12  Intake/Output from previous day: 11/15 0701 - 11/16 0700 In: 2228.3 [P.O.:200; I.V.:1728.3; IV Piggyback:300] Out: 1254 [Urine:1254] Intake/Output this shift:    General appearance: cooperative Resp: clear to auscultation bilaterally Cardio: regular rate and rhythm GI: distended but soft, tender at midline incision, no generalized tenderness, few BS Dry staining at midline dressing. Other dressings CDI  Lab Results:   Recent Labs  12/09/12 0635 12/10/12 0630  WBC 9.9 11.3*  HGB 9.5* 10.0*  HCT 27.6* 28.6*  PLT 252 305   BMET  Recent Labs  12/09/12 0635 12/10/12 0630  NA 135 128*  K 3.2* 3.7  CL 100 95*  CO2 25 23  GLUCOSE 134* 151*  BUN <3* 4*  CREATININE 0.43* 0.42*  CALCIUM 9.5 9.4   PT/INR No results found for this basename: LABPROT, INR,  in the last 72 hours ABG No results found for this basename: PHART, PCO2, PO2, HCO3,  in the last 72 hours  Studies/Results: Dg Abd Portable 1v  12/09/2012   CLINICAL DATA:  Severe lower abdominal pain status post surgery.  EXAM: PORTABLE ABDOMEN - 1 VIEW  COMPARISON:  CT abdomen 11/26/2012.  FINDINGS: There is gaseous distention of the small bowel and colon. . There is no pneumoperitoneum or portal venous gas. There is no pneumatosis. There is no bowel wall thickening. There are no pathologic calcifications. Surgical clips in the right upper quadrant are noted from prior cholecystectomy. There are cutaneous surgical staples along the midline.  There are severe degenerative changes of the right hip.  IMPRESSION:  Gaseous distention of small bowel: Most consistent with postoperative ileus given the patient's history.   Electronically Signed   By: Elige Ko   On: 12/09/2012 09:27    Anti-infectives: Anti-infectives   Start     Dose/Rate Route Frequency Ordered Stop   12/07/12 0600  cefoTEtan (CEFOTAN) 2 g in dextrose 5 % 50 mL IVPB     2 g 100 mL/hr over 30 Minutes Intravenous On call to O.R. 12/06/12 1420 12/07/12 1219   12/05/12 0600  metroNIDAZOLE (FLAGYL) IVPB 500 mg  Status:  Discontinued     500 mg 100 mL/hr over 60 Minutes Intravenous Every 8 hours 12/05/12 0525 12/07/12 0528      Assessment/Plan: s/p Procedure(s): LAPAROSCOPIC-ASSISTED TRANSVERSE COLECTOMY (N/A) POD#3 Post-op ileus - may need NGT if vomits again, ambulating, prelim films show mild improvement this AM Hypokalemia - corrected Hyponatremia - decrease IVF, should help to take clears also VTE - Lovenox I spoke to her husband  LOS: 5 days    Tamara [name] E 12/10/2012

## 2012-12-10 NOTE — Progress Notes (Signed)
IN TO PLACE NEW PIV PER STAFF RN REQUEST, PT REQUESTED PIV BE STARTED LATER DUE TO HER NOT FEELING WELL AT THIS TIME, STAFF RN MADE AWARE

## 2012-12-10 NOTE — Progress Notes (Signed)
Patient has had intermittent nausea throughout the night relieved by Zofran IV. Emesis X1 @ 0535 Zofran IV given @ 0542. Patient resting comfortably in bed. Will continue to monitor and offer support to the patient.

## 2012-12-10 NOTE — Progress Notes (Signed)
Patient Vomited around 1430 and continues to  complains of not feeling well placed a paged to [phone].

## 2012-12-11 ENCOUNTER — Ambulatory Visit: Payer: Medicare Other | Admitting: Hematology & Oncology

## 2012-12-11 ENCOUNTER — Inpatient Hospital Stay (HOSPITAL_COMMUNITY): Payer: Medicare Other

## 2012-12-11 ENCOUNTER — Other Ambulatory Visit: Payer: Medicare Other | Admitting: Lab

## 2012-12-11 ENCOUNTER — Encounter (HOSPITAL_COMMUNITY): Payer: Self-pay | Admitting: Surgery

## 2012-12-11 DIAGNOSIS — K922 Gastrointestinal hemorrhage, unspecified: Secondary | ICD-10-CM | POA: Diagnosis not present

## 2012-12-11 DIAGNOSIS — K567 Ileus, unspecified: Secondary | ICD-10-CM | POA: Diagnosis not present

## 2012-12-11 LAB — CBC WITH DIFFERENTIAL/PLATELET
Basophils Absolute: 0 10*3/uL (ref 0.0–0.1)
Basophils Relative: 0 % (ref 0–1)
Eosinophils Absolute: 0 10*3/uL (ref 0.0–0.7)
Eosinophils Relative: 0 % (ref 0–5)
Lymphocytes Relative: 8 % — ABNORMAL LOW (ref 12–46)
MCHC: 35.4 g/dL (ref 30.0–36.0)
MCV: 86.8 fL (ref 78.0–100.0)
Platelets: 351 10*3/uL (ref 150–400)
RDW: 16.3 % — ABNORMAL HIGH (ref 11.5–15.5)
WBC: 12.1 10*3/uL — ABNORMAL HIGH (ref 4.0–10.5)

## 2012-12-11 LAB — COMPREHENSIVE METABOLIC PANEL
ALT: 17 U/L (ref 0–35)
AST: 18 U/L (ref 0–37)
Albumin: 3 g/dL — ABNORMAL LOW (ref 3.5–5.2)
Alkaline Phosphatase: 77 U/L (ref 39–117)
BUN: 12 mg/dL (ref 6–23)
CO2: 26 mEq/L (ref 19–32)
Calcium: 9.7 mg/dL (ref 8.4–10.5)
Chloride: 98 mEq/L (ref 96–112)
Creatinine, Ser: 0.58 mg/dL (ref 0.50–1.10)
GFR calc Af Amer: >90 mL/min (ref >90)
GFR calc non Af Amer: 84 mL/min — ABNORMAL LOW (ref >90)
Glucose, Bld: 129 mg/dL — ABNORMAL HIGH (ref 70–99)
Potassium: 4.1 mEq/L (ref 3.5–5.1)
Sodium: 132 mEq/L — ABNORMAL LOW (ref 135–145)
Total Bilirubin: 0.4 mg/dL (ref 0.3–1.2)
Total Protein: 6.3 g/dL (ref 6.0–8.3)

## 2012-12-11 LAB — OCCULT BLOOD GASTRIC / DUODENUM (SPECIMEN CUP): Occult Blood, Gastric: POSITIVE — AB

## 2012-12-11 LAB — HEMOGLOBIN AND HEMATOCRIT, BLOOD
HCT: 27.7 % — ABNORMAL LOW (ref 36.0–46.0)
Hemoglobin: 9.9 g/dL — ABNORMAL LOW (ref 12.0–15.0)

## 2012-12-11 MED ORDER — SODIUM CHLORIDE 0.9 % IJ SOLN
10.0000 mL | INTRAMUSCULAR | Status: DC | PRN
  Administered 2012-12-11 – 2013-01-01 (×16): 10 mL

## 2012-12-11 MED ORDER — MORPHINE SULFATE 2 MG/ML IJ SOLN
1.0000 mg | INTRAMUSCULAR | Status: DC | PRN
  Administered 2012-12-12: 2 mg via INTRAVENOUS
  Administered 2012-12-13: 1 mg via INTRAVENOUS
  Administered 2012-12-13 – 2012-12-14 (×2): 2 mg via INTRAVENOUS
  Filled 2012-12-11 (×5): qty 1

## 2012-12-11 MED ORDER — HYDRALAZINE HCL 20 MG/ML IJ SOLN
10.0000 mg | Freq: Four times a day (QID) | INTRAMUSCULAR | Status: DC | PRN
  Administered 2012-12-12 – 2012-12-29 (×8): 10 mg via INTRAVENOUS
  Filled 2012-12-11 (×9): qty 1

## 2012-12-11 MED ORDER — PANTOPRAZOLE SODIUM 40 MG IV SOLR
40.0000 mg | Freq: Two times a day (BID) | INTRAVENOUS | Status: DC
  Administered 2012-12-11 – 2013-01-01 (×43): 40 mg via INTRAVENOUS
  Filled 2012-12-11 (×53): qty 40

## 2012-12-11 NOTE — Progress Notes (Signed)
Pt BP 217/101 at 1451. Hydralazine PRN was given. Follow up BP was 170/79 at 1548. MD contacted. PRN hydralazine dose was changed and only to be given for Systolic greater than 180. Will continue to monitor patient.

## 2012-12-11 NOTE — Progress Notes (Signed)
Subjective: Patient has still not had flatus.  NG placed yesterday with approximately 500 cc of fluid evacuated.  Now has black NG aspirate, quite possibly heme positive.  Patient seemed to complain that Zofran made her feel extremely sick.  Switched to low dose of IV Phenergan.  Albumin 3.0 yesterday.  Blood pressure okay on IV hydralazine when necessary  Objective: Weight change:   Intake/Output Summary (Last 24 hours) at 12/11/12 0812 Last data filed at 12/11/12 1610  Gross per 24 hour  Intake 921.67 ml  Output   1650 ml  Net -728.33 ml   Filed Vitals:   12/10/12 1444 12/10/12 1506 12/10/12 2100 12/11/12 0542  BP:  165/75 180/80 167/79  Pulse:   98 104  Temp: 97.7 F (36.5 C)  97.9 F (36.6 C) 98.6 F (37 C)  TempSrc: Oral  Oral Oral  Resp:   18 18  Height:      Weight:      SpO2:   98% 98%    General Appearance:  Eyes closed, conversive, feels very weak NG aspirate: Very dark/black Lungs: Clear to auscultation bilaterally, respirations unlabored Heart: Regular rate and rhythm, S1 and S2 normal, no murmur, rub or gallop Abdomen:  Soft with moderate tenderness.  Bowel sounds are decreased but present Extremities: Extremities normal, atraumatic, no cyanosis or edema Neuro: Nonfocal, lethargic  Lab Results: Results for orders placed during the hospital encounter of 12/05/12 (from the past 48 hour(s))  CBC WITH DIFFERENTIAL     Status: Abnormal   Collection Time    12/10/12  6:30 AM      Result Value Range   WBC 11.3 (*) 4.0 - 10.5 K/uL   RBC 3.24 (*) 3.87 - 5.11 MIL/uL   Hemoglobin 10.0 (*) 12.0 - 15.0 g/dL   HCT 96.0 (*) 45.4 - 09.8 %   MCV 88.3  78.0 - 100.0 fL   MCH 30.9  26.0 - 34.0 pg   MCHC 35.0  30.0 - 36.0 g/dL   RDW 11.9 (*) 14.7 - 82.9 %   Platelets 305  150 - 400 K/uL   Neutrophils Relative % 86 (*) 43 - 77 %   Neutro Abs 9.7 (*) 1.7 - 7.7 K/uL   Lymphocytes Relative 8 (*) 12 - 46 %   Lymphs Abs 0.8  0.7 - 4.0 K/uL   Monocytes Relative 6  3 - 12 %    Monocytes Absolute 0.7  0.1 - 1.0 K/uL   Eosinophils Relative 0  0 - 5 %   Eosinophils Absolute 0.0  0.0 - 0.7 K/uL   Basophils Relative 0  0 - 1 %   Basophils Absolute 0.0  0.0 - 0.1 K/uL  BASIC METABOLIC PANEL     Status: Abnormal   Collection Time    12/10/12  6:30 AM      Result Value Range   Sodium 128 (*) 135 - 145 mEq/L   Comment: DELTA CHECK NOTED   Potassium 3.7  3.5 - 5.1 mEq/L   Chloride 95 (*) 96 - 112 mEq/L   CO2 23  19 - 32 mEq/L   Glucose, Bld 151 (*) 70 - 99 mg/dL   BUN 4 (*) 6 - 23 mg/dL   Creatinine, Ser 5.62 (*) 0.50 - 1.10 mg/dL   Calcium 9.4  8.4 - 13.0 mg/dL   GFR calc non Af Amer >90  >90 mL/min   GFR calc Af Amer >90  >90 mL/min   Comment: (NOTE)  The eGFR has been calculated using the CKD EPI equation.     This calculation has not been validated in all clinical situations.     eGFR's persistently <90 mL/min signify possible Chronic Kidney     Disease.  CBC WITH DIFFERENTIAL     Status: Abnormal   Collection Time    12/11/12  6:53 AM      Result Value Range   WBC 12.1 (*) 4.0 - 10.5 K/uL   RBC 3.48 (*) 3.87 - 5.11 MIL/uL   Hemoglobin 10.7 (*) 12.0 - 15.0 g/dL   HCT 40.9 (*) 81.1 - 91.4 %   MCV 86.8  78.0 - 100.0 fL   MCH 30.7  26.0 - 34.0 pg   MCHC 35.4  30.0 - 36.0 g/dL   RDW 78.2 (*) 95.6 - 21.3 %   Platelets 351  150 - 400 K/uL   Neutrophils Relative % 84 (*) 43 - 77 %   Neutro Abs 10.2 (*) 1.7 - 7.7 K/uL   Lymphocytes Relative 8 (*) 12 - 46 %   Lymphs Abs 1.0  0.7 - 4.0 K/uL   Monocytes Relative 8  3 - 12 %   Monocytes Absolute 0.9  0.1 - 1.0 K/uL   Eosinophils Relative 0  0 - 5 %   Eosinophils Absolute 0.0  0.0 - 0.7 K/uL   Basophils Relative 0  0 - 1 %   Basophils Absolute 0.0  0.0 - 0.1 K/uL  COMPREHENSIVE METABOLIC PANEL     Status: Abnormal   Collection Time    12/11/12  6:53 AM      Result Value Range   Sodium 132 (*) 135 - 145 mEq/L   Potassium 4.1  3.5 - 5.1 mEq/L   Chloride 98  96 - 112 mEq/L   CO2 26  19 - 32 mEq/L    Glucose, Bld 129 (*) 70 - 99 mg/dL   BUN 12  6 - 23 mg/dL   Creatinine, Ser 0.86  0.50 - 1.10 mg/dL   Calcium 9.7  8.4 - 57.8 mg/dL   Total Protein 6.3  6.0 - 8.3 g/dL   Albumin 3.0 (*) 3.5 - 5.2 g/dL   AST 18  0 - 37 U/L   ALT 17  0 - 35 U/L   Alkaline Phosphatase 77  39 - 117 U/L   Total Bilirubin 0.4  0.3 - 1.2 mg/dL   GFR calc non Af Amer 84 (*) >90 mL/min   GFR calc Af Amer >90  >90 mL/min   Comment: (NOTE)     The eGFR has been calculated using the CKD EPI equation.     This calculation has not been validated in all clinical situations.     eGFR's persistently <90 mL/min signify possible Chronic Kidney     Disease.    Studies/Results: Dg Abd Portable 1v  12/09/2012   CLINICAL DATA:  Severe lower abdominal pain status post surgery.  EXAM: PORTABLE ABDOMEN - 1 VIEW  COMPARISON:  CT abdomen 11/26/2012.  FINDINGS: There is gaseous distention of the small bowel and colon. . There is no pneumoperitoneum or portal venous gas. There is no pneumatosis. There is no bowel wall thickening. There are no pathologic calcifications. Surgical clips in the right upper quadrant are noted from prior cholecystectomy. There are cutaneous surgical staples along the midline.  There are severe degenerative changes of the right hip.  IMPRESSION: Gaseous distention of small bowel: Most consistent with postoperative ileus given the patient's history.  Electronically Signed   By: Elige Ko   On: 12/09/2012 09:27   Dg Abd Portable 2v  12/10/2012   CLINICAL DATA:  Postop abdominal pain.  EXAM: PORTABLE ABDOMEN - 2 VIEW  COMPARISON:  12/09/2012  FINDINGS: Mild right colon distention is stable. There is no convincing obstruction. Findings are consistent with an adynamic ileus as described on the previous day's study. Some air is seen within the rectum.  Surgical staples are unchanged.  IMPRESSION: Persistent postoperative ileus.  No convincing obstruction.   Electronically Signed   By: Amie Portland M.D.   On:  12/10/2012 11:19   Medications: Scheduled Meds: . alvimopan  12 mg Oral BID  . fentaNYL  50-100 mcg Intravenous Once  . lisinopril  10 mg Oral Daily  . pantoprazole (PROTONIX) IV  40 mg Intravenous Q12H   Continuous Infusions: . dextrose 5 % and 0.9 % NaCl with KCl 20 mEq/L 50 mL/hr at 12/10/12 1246   PRN Meds:.acetaminophen, acetaminophen, fentaNYL, hydrALAZINE, HYDROmorphone, menthol-cetylpyridinium, midazolam, phenol, promethazine  Assessment/Plan: Principal Problem:   GI bleed Active Problems:   Pacemaker implanted 08/28/12   Essential hypertension, benign   D (diarrhea)   Colonic mass   Gastric bleed   Ileus, postoperative  Principal Problem:  Abdominal pain - somewhat better with NG tube. Still has not passed flatus.  Minimal bowel sounds.  NG aspirate is black, likely heme positive.  Will start IV Protonix Active Problems:  Pacemaker implanted 08/28/12 - functioning normally currently Essential hypertension, benign - blood pressure mildly elevated systolically, likely secondary to pain  D (diarrhea) - resolved with surgery - has not passed flatus yet  Colonic mass - will continue to check for surgical pathology results. Biopsies from colonoscopy revealed dysplasia  Continue incentive spirometry  Physical therapy as tolerated  FEN -will keep n.p.o. except for sips of clear liquids.  Continue IV fluids. Continue NG tube.  We'll place PICC - quite likely will need TPN short-term - would like to be proactive in this regard considering her age Probable heme positive GI aspirate - confirmed with heme testing and follow hemoglobin every 12 and start IV Protonix   LOS: 6 days   Pearla Dubonnet, MD 12/11/2012, 8:12 AM

## 2012-12-11 NOTE — Progress Notes (Signed)
Patient ID: Tamara [name], female   DOB: 25-Jul-1929, 79 y.o.   MRN: [id number]  Subjective: Pt feels terrible, c/o abdominal pain and nausea.  No flatus.  Pain meds are making her sick.  out of NGT brown/bloody in appearance. Husband at bedside  Objective:  Vital signs:  Filed Vitals:   12/10/12 1444 12/10/12 1506 12/10/12 2100 12/11/12 0542  BP:  165/75 180/80 167/79  Pulse:   98 104  Temp: 97.7 F (36.5 C)  97.9 F (36.6 C) 98.6 F (37 C)  TempSrc: Oral  Oral Oral  Resp:   18 18  Height:      Weight:      SpO2:   98% 98%    Last BM Date: 12/06/12  Intake/Output   Yesterday:  11/16 0701 - 11/17 0700 In: 921.7 [P.O.:360; I.V.:561.7] Out: 1650 [Urine:700; Emesis/NG output:950] This shift:    I/O last 3 completed shifts: In: 2050 [P.O.:360; I.V.:1690] Out: 2200 [Urine:1250; Emesis/NG output:950]       Bowel function:  Flatus: none  BM: none  Drain: NGT  Physical Exam:  General: Pt awake/alert/oriented x3 in mild acute distress Chest: CTA No chest wall pain w good excursion CV:  s1s2 regularly irregular rhythm. No murmurs, gallops or rubs.  +2 distal pulses.  No edema. Abdomen:hypoactive bowel sounds, abdomen is distended. No evidence of peritonitis.  No incarcerated hernias.  Midline incision-edges are approximated, honeycomb dressing removed, staples are intact, no erythema.  Lap sites are intact, staples, no erythema.  NGT with brown, bloody drainage.     Problem List:   Principal Problem:   GI bleed Active Problems:   Pacemaker implanted 08/28/12   Essential hypertension, benign   D (diarrhea)   Colonic mass   Gastric bleed   Ileus, postoperative    Results:   Labs: Results for orders placed during the hospital encounter of 12/05/12 (from the past 48 hour(s))  CBC WITH DIFFERENTIAL     Status: Abnormal   Collection Time    12/10/12  6:30 AM      Result Value Range   WBC 11.3 (*) 4.0 - 10.5 K/uL   RBC 3.24 (*) 3.87 - 5.11 MIL/uL   Hemoglobin 10.0 (*) 12.0 - 15.0 g/dL   HCT 40.9 (*) 81.1 - 91.4 %   MCV 88.3  78.0 - 100.0 fL   MCH 30.9  26.0 - 34.0 pg   MCHC 35.0  30.0 - 36.0 g/dL   RDW 78.2 (*) 95.6 - 21.3 %   Platelets 305  150 - 400 K/uL   Neutrophils Relative % 86 (*) 43 - 77 %   Neutro Abs 9.7 (*) 1.7 - 7.7 K/uL   Lymphocytes Relative 8 (*) 12 - 46 %   Lymphs Abs 0.8  0.7 - 4.0 K/uL   Monocytes Relative 6  3 - 12 %   Monocytes Absolute 0.7  0.1 - 1.0 K/uL   Eosinophils Relative 0  0 - 5 %   Eosinophils Absolute 0.0  0.0 - 0.7 K/uL   Basophils Relative 0  0 - 1 %   Basophils Absolute 0.0  0.0 - 0.1 K/uL  BASIC METABOLIC PANEL     Status: Abnormal   Collection Time    12/10/12  6:30 AM      Result Value Range   Sodium 128 (*) 135 - 145 mEq/L   Comment: DELTA CHECK NOTED   Potassium 3.7  3.5 - 5.1 mEq/L   Chloride 95 (*) 96 -  112 mEq/L   CO2 23  19 - 32 mEq/L   Glucose, Bld 151 (*) 70 - 99 mg/dL   BUN 4 (*) 6 - 23 mg/dL   Creatinine, Ser 1.61 (*) 0.50 - 1.10 mg/dL   Calcium 9.4  8.4 - 09.6 mg/dL   GFR calc non Af Amer >90  >90 mL/min   GFR calc Af Amer >90  >90 mL/min   Comment: (NOTE)     The eGFR has been calculated using the CKD EPI equation.     This calculation has not been validated in all clinical situations.     eGFR's persistently <90 mL/min signify possible Chronic Kidney     Disease.  CBC WITH DIFFERENTIAL     Status: Abnormal   Collection Time    12/11/12  6:53 AM      Result Value Range   WBC 12.1 (*) 4.0 - 10.5 K/uL   RBC 3.48 (*) 3.87 - 5.11 MIL/uL   Hemoglobin 10.7 (*) 12.0 - 15.0 g/dL   HCT 04.5 (*) 40.9 - 81.1 %   MCV 86.8  78.0 - 100.0 fL   MCH 30.7  26.0 - 34.0 pg   MCHC 35.4  30.0 - 36.0 g/dL   RDW 91.4 (*) 78.2 - 95.6 %   Platelets 351  150 - 400 K/uL   Neutrophils Relative % 84 (*) 43 - 77 %   Neutro Abs 10.2 (*) 1.7 - 7.7 K/uL   Lymphocytes Relative 8 (*) 12 - 46 %   Lymphs Abs 1.0  0.7 - 4.0 K/uL   Monocytes Relative 8  3 - 12 %   Monocytes Absolute 0.9  0.1 - 1.0  K/uL   Eosinophils Relative 0  0 - 5 %   Eosinophils Absolute 0.0  0.0 - 0.7 K/uL   Basophils Relative 0  0 - 1 %   Basophils Absolute 0.0  0.0 - 0.1 K/uL  COMPREHENSIVE METABOLIC PANEL     Status: Abnormal   Collection Time    12/11/12  6:53 AM      Result Value Range   Sodium 132 (*) 135 - 145 mEq/L   Potassium 4.1  3.5 - 5.1 mEq/L   Chloride 98  96 - 112 mEq/L   CO2 26  19 - 32 mEq/L   Glucose, Bld 129 (*) 70 - 99 mg/dL   BUN 12  6 - 23 mg/dL   Creatinine, Ser 2.13  0.50 - 1.10 mg/dL   Calcium 9.7  8.4 - 08.6 mg/dL   Total Protein 6.3  6.0 - 8.3 g/dL   Albumin 3.0 (*) 3.5 - 5.2 g/dL   AST 18  0 - 37 U/L   ALT 17  0 - 35 U/L   Alkaline Phosphatase 77  39 - 117 U/L   Total Bilirubin 0.4  0.3 - 1.2 mg/dL   GFR calc non Af Amer 84 (*) >90 mL/min   GFR calc Af Amer >90  >90 mL/min   Comment: (NOTE)     The eGFR has been calculated using the CKD EPI equation.     This calculation has not been validated in all clinical situations.     eGFR's persistently <90 mL/min signify possible Chronic Kidney     Disease.    Imaging / Studies: Dg Abd Portable 1v  12/09/2012   CLINICAL DATA:  Severe lower abdominal pain status post surgery.  EXAM: PORTABLE ABDOMEN - 1 VIEW  COMPARISON:  CT abdomen 11/26/2012.  FINDINGS:  There is gaseous distention of the small bowel and colon. . There is no pneumoperitoneum or portal venous gas. There is no pneumatosis. There is no bowel wall thickening. There are no pathologic calcifications. Surgical clips in the right upper quadrant are noted from prior cholecystectomy. There are cutaneous surgical staples along the midline.  There are severe degenerative changes of the right hip.  IMPRESSION: Gaseous distention of small bowel: Most consistent with postoperative ileus given the patient's history.   Electronically Signed   By: Elige Ko   On: 12/09/2012 09:27   Dg Abd Portable 2v  12/10/2012   CLINICAL DATA:  Postop abdominal pain.  EXAM: PORTABLE ABDOMEN -  2 VIEW  COMPARISON:  12/09/2012  FINDINGS: Mild right colon distention is stable. There is no convincing obstruction. Findings are consistent with an adynamic ileus as described on the previous day's study. Some air is seen within the rectum.  Surgical staples are unchanged.  IMPRESSION: Persistent postoperative ileus.  No convincing obstruction.   Electronically Signed   By: Amie Portland M.D.   On: 12/10/2012 11:19    Medications / Allergies: per chart  Antibiotics: Anti-infectives   Start     Dose/Rate Route Frequency Ordered Stop   12/07/12 0600  cefoTEtan (CEFOTAN) 2 g in dextrose 5 % 50 mL IVPB     2 g 100 mL/hr over 30 Minutes Intravenous On call to O.R. 12/06/12 1420 12/07/12 1219   12/05/12 0600  metroNIDAZOLE (FLAGYL) IVPB 500 mg  Status:  Discontinued     500 mg 100 mL/hr over 60 Minutes Intravenous Every 8 hours 12/05/12 0525 12/07/12 0528      Assessment/Plan Partially obstructing transverse colon mass Laparoscopic assisted transverse colectomy(Dr. Cornett 12/07/12) Post operative ileus -POD #4 -continue with NGT to low intermittent suction, bowel rest -mobilize  -change dilaudid to IV morphine perhaps she can tolerate this better. -avoid Toradol given bloody appearance in NG output.  Agree with protonix, monitoring h&h -IV hydration.  Agree with PICC line placement and enteral nutrition -repeat films in AM -await surgical pathology  Hypokalemia-resolved Hyponatremia -improved   Ashok Norris, [facility] Pager [phone] Office [phone]  12/11/2012 9:26 AM

## 2012-12-11 NOTE — Progress Notes (Signed)
Peripherally Inserted Central Catheter/Midline Placement  The IV Nurse has discussed with the patient and/or persons authorized to consent for the patient, the purpose of this procedure and the potential benefits and risks involved with this procedure.  The benefits include less needle sticks, lab draws from the catheter and patient may be discharged home with the catheter.  Risks include, but not limited to, infection, bleeding, blood clot (thrombus formation), and puncture of an artery; nerve damage and irregular heat beat.  Alternatives to this procedure were also discussed.  PICC/Midline Placement Documentation        Vevelyn Pat 12/11/2012, 10:43 AM

## 2012-12-11 NOTE — Progress Notes (Signed)
Physical Therapy Treatment Patient Details Name: Tamara [name] MRN: [id number] DOB: 01-Jul-1929 Today's Date: 12/11/2012 Time: 4098-1191 PT Time Calculation (min): 23 min  PT Assessment / Plan / Recommendation  History of Present Illness 79 y/o WF admitted with GI bleed and s/p partialcolectomy after sigmoidoscopy revealed mass consistent with malignancy.   PT Comments   Pt required encouragement to participate in therapy.  States she doesn't feel well.  Pt now with NG tube.  Discussed with pt importance of participating & attempting mobility if able-- pt then agreeable.      Follow Up Recommendations  Home health PT     Does the patient have the potential to tolerate intense rehabilitation     Barriers to Discharge        Equipment Recommendations  Rolling walker with 5" wheels    Recommendations for Other Services    Frequency Min 3X/week   Progress towards PT Goals Progress towards PT goals: Progressing toward goals  Plan Current plan remains appropriate    Precautions / Restrictions Restrictions Weight Bearing Restrictions: No       Mobility  Bed Mobility Bed Mobility: Rolling Left;Left Sidelying to Sit;Sitting - Scoot to Edge of Bed Rolling Right: 4: Min guard;With rail Right Sidelying to Sit: 4: Min assist;HOB flat;With rails Sitting - Scoot to Edge of Bed: 4: Min guard Details for Bed Mobility Assistance: Incr time.  (A) to lift shoulders/trunk to sitting upright Transfers Transfers: Sit to Stand;Stand to Sit;Stand Pivot Transfers Sit to Stand: 4: Min guard;With upper extremity assist;From bed Stand to Sit: 4: Min guard;With upper extremity assist;With armrests;To chair/3-in-1 Stand Pivot Transfers: 4: Min guard Ambulation/Gait Ambulation/Gait Assistance: Not tested (comment) (per pt's request & hooked up to wall suction)    Exercises General Exercises - Lower Extremity Ankle Circles/Pumps: AROM;Both;10 reps Long Arc Quad: AROM;Strengthening;Both;10  reps Heel Slides: AROM;Strengthening;Both;10 reps Straight Leg Raises: AROM;Strengthening;Both;10 reps Hip Flexion/Marching: AROM;Strengthening;Both;10 reps Toe Raises: AROM;Both;10 reps Heel Raises: AROM;Both;10 reps     PT Goals (current goals can now be found in the care plan section) Acute Rehab PT Goals PT Goal Formulation: With patient Time For Goal Achievement: 12/22/12 Potential to Achieve Goals: Good  Visit Information  Last PT Received On: 12/11/12 Assistance Needed: +1 History of Present Illness: 79 y/o WF admitted with GI bleed and s/p partialcolectomy after sigmoidoscopy revealed mass consistent with malignancy.    Subjective Data      Cognition  Cognition Arousal/Alertness: Awake/alert Behavior During Therapy: WFL for tasks assessed/performed Overall Cognitive Status: Within Functional Limits for tasks assessed    Balance     End of Session PT - End of Session Activity Tolerance: Patient tolerated treatment well Patient left: in chair;with call bell/phone within reach;with family/visitor present Nurse Communication: Mobility status   GP     Lara Mulch 12/11/2012, 2:57 PM  Verdell Face, PTA [phone] 12/11/2012

## 2012-12-11 NOTE — Progress Notes (Signed)
Agree with above, it appears she has an ileus right now and not anything more sinister, will cont treat with ng repeat films in am, recheck hct later today increase protonix due to some possible blood in tube (this may just be enteric contents)

## 2012-12-12 ENCOUNTER — Inpatient Hospital Stay (HOSPITAL_COMMUNITY): Payer: Medicare Other

## 2012-12-12 ENCOUNTER — Ambulatory Visit: Payer: Medicare Other | Admitting: Hematology & Oncology

## 2012-12-12 ENCOUNTER — Encounter (HOSPITAL_COMMUNITY): Payer: Self-pay | Admitting: Radiology

## 2012-12-12 ENCOUNTER — Other Ambulatory Visit: Payer: Medicare Other | Admitting: Lab

## 2012-12-12 LAB — CBC WITH DIFFERENTIAL/PLATELET
Basophils Absolute: 0 10*3/uL (ref 0.0–0.1)
Eosinophils Absolute: 0.1 10*3/uL (ref 0.0–0.7)
HCT: 28.2 % — ABNORMAL LOW (ref 36.0–46.0)
Lymphocytes Relative: 15 % (ref 12–46)
Lymphs Abs: 1.6 10*3/uL (ref 0.7–4.0)
MCHC: 33.7 g/dL (ref 30.0–36.0)
Neutro Abs: 7.4 10*3/uL (ref 1.7–7.7)
Neutrophils Relative %: 72 % (ref 43–77)
Platelets: 302 10*3/uL (ref 150–400)
RBC: 3.19 MIL/uL — ABNORMAL LOW (ref 3.87–5.11)
RDW: 16.4 % — ABNORMAL HIGH (ref 11.5–15.5)
WBC: 10.2 10*3/uL (ref 4.0–10.5)

## 2012-12-12 LAB — BASIC METABOLIC PANEL
CO2: 26 mEq/L (ref 19–32)
GFR calc non Af Amer: 88 mL/min — ABNORMAL LOW (ref >90)
Glucose, Bld: 116 mg/dL — ABNORMAL HIGH (ref 70–99)
Potassium: 3.9 mEq/L (ref 3.5–5.1)
Sodium: 133 mEq/L — ABNORMAL LOW (ref 135–145)

## 2012-12-12 LAB — MAGNESIUM: Magnesium: 2 mg/dL (ref 1.5–2.5)

## 2012-12-12 LAB — PHOSPHORUS: Phosphorus: 2.6 mg/dL (ref 2.3–4.6)

## 2012-12-12 MED ORDER — IOHEXOL 300 MG/ML  SOLN
25.0000 mL | INTRAMUSCULAR | Status: AC
  Administered 2012-12-12 (×2): 25 mL via ORAL

## 2012-12-12 MED ORDER — FAT EMULSION 20 % IV EMUL
240.0000 mL | INTRAVENOUS | Status: AC
  Administered 2012-12-12: 17:00:00 240 mL via INTRAVENOUS
  Filled 2012-12-12: qty 250

## 2012-12-12 MED ORDER — IOHEXOL 300 MG/ML  SOLN
80.0000 mL | Freq: Once | INTRAMUSCULAR | Status: AC | PRN
  Administered 2012-12-12: 80 mL via INTRAVENOUS

## 2012-12-12 MED ORDER — KCL IN DEXTROSE-NACL 40-5-0.9 MEQ/L-%-% IV SOLN
INTRAVENOUS | Status: DC
  Administered 2012-12-12: 17:00:00 via INTRAVENOUS
  Administered 2012-12-13: 35 mL/h via INTRAVENOUS
  Administered 2012-12-14: via INTRAVENOUS
  Filled 2012-12-12 (×4): qty 1000

## 2012-12-12 MED ORDER — TRACE MINERALS CR-CU-F-FE-I-MN-MO-SE-ZN IV SOLN
INTRAVENOUS | Status: AC
  Administered 2012-12-12: 17:00:00 via INTRAVENOUS
  Filled 2012-12-12: qty 1000

## 2012-12-12 MED ORDER — INSULIN ASPART 100 UNIT/ML ~~LOC~~ SOLN
0.0000 [IU] | Freq: Four times a day (QID) | SUBCUTANEOUS | Status: DC
  Administered 2012-12-13 – 2012-12-16 (×13): 1 [IU] via SUBCUTANEOUS

## 2012-12-12 NOTE — Progress Notes (Signed)
Physical Therapy Treatment Patient Details Name: Tamara [name] MRN: [id number] DOB: Jan 13, 1930 Today's Date: 12/12/2012 Time: 0911-0940 PT Time Calculation (min): 29 min  PT Assessment / Plan / Recommendation  History of Present Illness 79 y/o WF admitted with GI bleed and s/p partialcolectomy after sigmoidoscopy revealed mass consistent with malignancy.   PT Comments   Pt requires max encouragement to participate in therapy. Can be self limiting at times. Pt refusing to ambulate with RW today but was able to increase ambulation distance with handheld (A). D/C disposition updated due to (A) needed at this time and overall deconditioning. Pt agreeable to ST SNF at this time prior to returning home with husband.   Follow Up Recommendations  SNF;Supervision/Assistance - 24 hour     Does the patient have the potential to tolerate intense rehabilitation     Barriers to Discharge        Equipment Recommendations  Rolling walker with 5" wheels    Recommendations for Other Services    Frequency Min 3X/week   Progress towards PT Goals Progress towards PT goals: Progressing toward goals  Plan Discharge plan needs to be updated    Precautions / Restrictions Precautions Precautions: Fall Precaution Comments: Abd surgery; NG tube Restrictions Weight Bearing Restrictions: No   Pertinent Vitals/Pain C/o of abdominal pain initially,  did not rate pain; after repositioned in chair pt denied pain.     Mobility  Bed Mobility Bed Mobility: Rolling Left;Left Sidelying to Sit Rolling Left: 4: Min guard;With rail Left Sidelying to Sit: 4: Min assist;HOB elevated Details for Bed Mobility Assistance: pt reqyires incr time to perform log rolling technique with cues and handrails; (A) to lift shoulders and trunk to sitting position at EOB Transfers Transfers: Sit to Stand;Stand to Sit Sit to Stand: 4: Min assist;From bed;With upper extremity assist Stand to Sit: 4: Min assist;To  chair/3-in-1;With upper extremity assist Details for Transfer Assistance: min (A) to maintain balance and achieve upright standing position; cues for hand placement and sequencing with RW: requires encouragement to participate Ambulation/Gait Ambulation/Gait Assistance: 4: Min guard Ambulation Distance (Feet): 70 Feet Assistive device: 1 person hand held assist Ambulation/Gait Assistance Details: pt refused to walk with RW today; stated "i just want you to hold onto me, i dont trust the walker"; narrow BOS and short shuffled length secondary to fear and pain in abdomen; cues for safety and sequencing  Gait Pattern: Decreased stride length;Narrow base of support;Shuffle Gait velocity: very decreased General Gait Details: pt tends to close eyes with ambulation cues to look forward with ambulation Stairs: No Wheelchair Mobility Wheelchair Mobility: No    Exercises General Exercises - Lower Extremity Ankle Circles/Pumps: AROM;Both;10 reps;Supine Long Arc Quad: AROM;Strengthening;Both;10 reps Heel Slides: AROM;Strengthening;Both;10 reps Hip Flexion/Marching: AROM;Strengthening;Both;10 reps   PT Diagnosis:    PT Problem List:   PT Treatment Interventions:     PT Goals (current goals can now be found in the care plan section) Acute Rehab PT Goals Patient Stated Goal: To get stronger and go home PT Goal Formulation: With patient/family Time For Goal Achievement: 12/22/12 Potential to Achieve Goals: Good  Visit Information  Last PT Received On: 12/12/12 Assistance Needed: +1 History of Present Illness: 79 y/o WF admitted with GI bleed and s/p partialcolectomy after sigmoidoscopy revealed mass consistent with malignancy.    Subjective Data  Subjective: pt lying supine with husband present; 'im so weak. i dont know what ill be able to do.' agreeable to therapy Patient Stated Goal: To get stronger and  go home   Cognition  Cognition Arousal/Alertness: Awake/alert Behavior During  Therapy: WFL for tasks assessed/performed Overall Cognitive Status: Within Functional Limits for tasks assessed    Balance  Balance Balance Assessed: No  End of Session PT - End of Session Equipment Utilized During Treatment: Gait belt Activity Tolerance: Patient tolerated treatment well Patient left: in chair;with call bell/phone within reach;with family/visitor present Nurse Communication: Mobility status   GP     Donell Sievert, [place] [phone] 12/12/2012, 10:42 AM

## 2012-12-12 NOTE — Progress Notes (Signed)
INITIAL NUTRITION ASSESSMENT  DOCUMENTATION CODES Per approved criteria  -Not Applicable   INTERVENTION: TPN per PharmD. RD to continue to follow nutrition care plan.  NUTRITION DIAGNOSIS: Inadequate oral intake related to altered GI function as evidenced by very limited PO intake.   Goal: Intake to meet >90% of estimated nutrition needs.  Monitor:  weight trends, lab trends, I/O's, TPN tolerance and transition to diet  Reason for Assessment: Prolonged NPO/Clear Liquid Status + New TPN  79 y.o. female  Admitting Dx: GI bleed  ASSESSMENT: PMHx significant for breast CA and colitis. Pt was recently admitted for colitis, and presented to ER 2/2 rectal bleeding. She states that since her last discharge she has been having dark, liquidy stools.   Underwent flex sig on 11/12 - findings revealed fungating ulcerated nonobstructing mass (suspected malignancy) in transverse colon -s/p biopsies and tattooing and internal hemorrhoids.  Underwent partial colectomy on 11/13. NGT placed 11/16 pt with 450 ml of output yesterday. Pt has not been able to advance beyond clear liquids during this admission. Discussed oral intake with patient and husband - PTA, pt had a "great appetite." This is confirmed in weight hx, weight has been very stable. Pt has had limited intake of clear liquids since admission, issues with n/v. States that her NGT is very uncomfortable and can't really swallow with it in. Understands that she is going to start receiving IV nutrition this afternoon.  Patient is to begin receiving TPN this afternoon with Clinimix E 5/15 @ 40 ml/hr and lipids @ 10 ml/hr. Provides 1162 kcal, and 48 grams protein per day. Meets 68% minimum estimated energy needs and 55% minimum estimated protein needs.  Sodium is low at 133, but trending up.  Height: Ht Readings from Last 1 Encounters:  12/05/12 5\' 5"  (1.651 m)    Weight: Wt Readings from Last 1 Encounters:  12/05/12 127 lb 10.3 oz (57.9  kg)    Ideal Body Weight: 125 lb  % Ideal Body Weight: 102%  Wt Readings from Last 10 Encounters:  12/05/12 127 lb 10.3 oz (57.9 kg)  12/05/12 127 lb 10.3 oz (57.9 kg)  12/05/12 127 lb 10.3 oz (57.9 kg)  10/04/12 128 lb 11.2 oz (58.378 kg)  09/06/12 128 lb (58.06 kg)  08/28/12 131 lb 11.2 oz (59.739 kg)  08/28/12 131 lb 11.2 oz (59.739 kg)  08/28/12 131 lb 11.2 oz (59.739 kg)  06/09/12 131 lb (59.421 kg)  12/09/11 128 lb (58.06 kg)    Usual Body Weight: 130 lb  % Usual Body Weight: 98%  BMI:  Body mass index is 21.24 kg/(m^2). WNL  Estimated Nutritional Needs: Kcal: 1700 - 1950 Protein: at least 87 g protein daily Fluid: at least 1.7 liters daily  Skin: abdomen incision  Diet Order: NPO  EDUCATION NEEDS: -No education needs identified at this time   Intake/Output Summary (Last 24 hours) at 12/12/12 0931 Last data filed at 12/12/12 0513  Gross per 24 hour  Intake 1111.25 ml  Output    800 ml  Net 311.25 ml    Last BM: 11/12  Labs:   Recent Labs Lab 12/10/12 0630 12/11/12 0653 12/12/12 0510  NA 128* 132* 133*  K 3.7 4.1 3.9  CL 95* 98 100  CO2 23 26 26   BUN 4* 12 14  CREATININE 0.42* 0.58 0.50  CALCIUM 9.4 9.7 9.4  MG  --   --  2.0  PHOS  --   --  2.6  GLUCOSE 151* 129* 116*  CBG (last 3)  No results found for this basename: GLUCAP,  in the last 72 hours  Scheduled Meds: . fentaNYL  50-100 mcg Intravenous Once  . [START ON 12/13/2012] insulin aspart  0-9 Units Subcutaneous Q6H  . pantoprazole (PROTONIX) IV  40 mg Intravenous Q12H    Continuous Infusions: . dextrose 5 % and 0.9 % NaCl with KCl 20 mEq/L 75 mL/hr at 12/12/12 0036  . dextrose 5 % and 0.9 % NaCl with KCl 40 mEq/L    . Marland KitchenTPN (CLINIMIX-E) Adult     And  . fat emulsion      Past Medical History  Diagnosis Date  . Breast cancer   . HTN (hypertension) August 2014  . Syncope and collapse August 2014    sinus pauses/ PTVDP  . Colitis     Past Surgical History   Procedure Laterality Date  . Breast lumpectomy    . Cholecystectomy    . Tonsillectomy    . Pacemaker insertion  08/28/12    MDT  . Flexible sigmoidoscopy N/A 12/06/2012    Procedure: FLEXIBLE SIGMOIDOSCOPY;  Surgeon: Shirley Friar, MD;  Location: [facility] ENDOSCOPY;  Service: Endoscopy;  Laterality: N/A;  . Laparoscopic partial colectomy N/A 12/07/2012    Procedure: LAPAROSCOPIC-ASSISTED TRANSVERSE COLECTOMY;  Surgeon: Clovis Pu. Cornett, MD;  Location: [facility] OR;  Service: General;  Laterality: N/A;    Jarold Motto MS, RD, LDN Pager: [phone] After-hours pager: [phone]

## 2012-12-12 NOTE — Clinical Social Work Placement (Signed)
Clinical Social Work Department CLINICAL SOCIAL WORK PLACEMENT NOTE 12/12/2012  Patient:  Tamara [name], Tamara [name]  Account Number:  [id number] Admit date:  12/04/2012  Clinical Social Worker:  Cherre Blanc, [place]  Date/time:  12/12/2012 06:57 PM  Clinical Social Work is seeking post-discharge placement for this patient at the following level of care:   SKILLED NURSING   (*CSW will update this form in Epic as items are completed)   12/12/2012  Patient/family provided with [facility] Department of Clinical Social Work's list of facilities offering this level of care within the geographic area requested by the patient (or if unable, by the patient's family).  12/12/2012  Patient/family informed of their freedom to choose among providers that offer the needed level of care, that participate in Medicare, Medicaid or managed care program needed by the patient, have an available bed and are willing to accept the patient.  12/12/2012  Patient/family informed of [facility]' ownership interest in [facility], as well as of the fact that they are under no obligation to receive care at this facility.  PASARR submitted to EDS on 12/12/2012 PASARR number received from EDS on 12/12/2012  FL2 transmitted to all facilities in geographic area requested by pt/family on  12/12/2012 FL2 transmitted to all facilities within larger geographic area on   Patient informed that his/her managed care company has contracts with or will negotiate with  certain facilities, including the following:     Patient/family informed of bed offers received:   Patient chooses bed at  Physician recommends and patient chooses bed at    Patient to be transferred to  on   Patient to be transferred to facility by   The following physician request were entered in Epic:   Additional Comments:  Roddie Mc, [place], [place], [phone]

## 2012-12-12 NOTE — Progress Notes (Signed)
Subjective: Patient with NG in NG aspirate has cleared, no longer black.  Positive for blood yesterday.  Still with some nausea and no passage of flatus.  Abdomen still feels distended.  She is not short of breath.  No chest pain  Objective: Weight change:   Intake/Output Summary (Last 24 hours) at 12/12/12 0737 Last data filed at 12/12/12 0513  Gross per 24 hour  Intake 1111.25 ml  Output    800 ml  Net 311.25 ml   Filed Vitals:   12/11/12 1548 12/11/12 1816 12/11/12 2120 12/12/12 0508  BP: 170/79 148/78 170/83 178/82  Pulse: 99  95 103  Temp:   98.2 F (36.8 C) 98.3 F (36.8 C)  TempSrc:   Oral Oral  Resp:   17 18  Height:      Weight:      SpO2:   97% 97%    General Appearance: Eyes closed, conversive, feels very weak  NG aspirate: Bile-colored, clear Lungs: Clear to auscultation bilaterally, respirations unlabored  Heart: Regular rate and rhythm, S1 and S2 normal, no murmur, rub or gallop  Abdomen: Soft with moderate tenderness. Bowel sounds are quiet  Extremities: Extremities normal, atraumatic, no cyanosis or edema  Neuro: Nonfocal, lethargic    Lab Results: Results for orders placed during the hospital encounter of 12/05/12 (from the past 48 hour(s))  CBC WITH DIFFERENTIAL     Status: Abnormal   Collection Time    12/11/12  6:53 AM      Result Value Range   WBC 12.1 (*) 4.0 - 10.5 K/uL   RBC 3.48 (*) 3.87 - 5.11 MIL/uL   Hemoglobin 10.7 (*) 12.0 - 15.0 g/dL   HCT 16.1 (*) 09.6 - 04.5 %   MCV 86.8  78.0 - 100.0 fL   MCH 30.7  26.0 - 34.0 pg   MCHC 35.4  30.0 - 36.0 g/dL   RDW 40.9 (*) 81.1 - 91.4 %   Platelets 351  150 - 400 K/uL   Neutrophils Relative % 84 (*) 43 - 77 %   Neutro Abs 10.2 (*) 1.7 - 7.7 K/uL   Lymphocytes Relative 8 (*) 12 - 46 %   Lymphs Abs 1.0  0.7 - 4.0 K/uL   Monocytes Relative 8  3 - 12 %   Monocytes Absolute 0.9  0.1 - 1.0 K/uL   Eosinophils Relative 0  0 - 5 %   Eosinophils Absolute 0.0  0.0 - 0.7 K/uL   Basophils Relative 0  0  - 1 %   Basophils Absolute 0.0  0.0 - 0.1 K/uL  COMPREHENSIVE METABOLIC PANEL     Status: Abnormal   Collection Time    12/11/12  6:53 AM      Result Value Range   Sodium 132 (*) 135 - 145 mEq/L   Potassium 4.1  3.5 - 5.1 mEq/L   Chloride 98  96 - 112 mEq/L   CO2 26  19 - 32 mEq/L   Glucose, Bld 129 (*) 70 - 99 mg/dL   BUN 12  6 - 23 mg/dL   Creatinine, Ser 7.82  0.50 - 1.10 mg/dL   Calcium 9.7  8.4 - 95.6 mg/dL   Total Protein 6.3  6.0 - 8.3 g/dL   Albumin 3.0 (*) 3.5 - 5.2 g/dL   AST 18  0 - 37 U/L   ALT 17  0 - 35 U/L   Alkaline Phosphatase 77  39 - 117 U/L   Total Bilirubin 0.4  0.3 - 1.2 mg/dL   GFR calc non Af Amer 84 (*) >90 mL/min   GFR calc Af Amer >90  >90 mL/min   Comment: (NOTE)     The eGFR has been calculated using the CKD EPI equation.     This calculation has not been validated in all clinical situations.     eGFR's persistently <90 mL/min signify possible Chronic Kidney     Disease.  OCCULT BLOOD GASTRIC / DUODENUM (SPECIMEN CUP)     Status: Abnormal   Collection Time    12/11/12 12:03 PM      Result Value Range   pH, Gastric NOT DONE     Occult Blood, Gastric POSITIVE (*) NEGATIVE  HEMOGLOBIN AND HEMATOCRIT, BLOOD     Status: Abnormal   Collection Time    12/11/12  5:00 PM      Result Value Range   Hemoglobin 9.9 (*) 12.0 - 15.0 g/dL   HCT 04.5 (*) 40.9 - 81.1 %  CBC WITH DIFFERENTIAL     Status: Abnormal   Collection Time    12/12/12  5:10 AM      Result Value Range   WBC 10.2  4.0 - 10.5 K/uL   RBC 3.19 (*) 3.87 - 5.11 MIL/uL   Hemoglobin 9.5 (*) 12.0 - 15.0 g/dL   HCT 91.4 (*) 78.2 - 95.6 %   MCV 88.4  78.0 - 100.0 fL   MCH 29.8  26.0 - 34.0 pg   MCHC 33.7  30.0 - 36.0 g/dL   RDW 21.3 (*) 08.6 - 57.8 %   Platelets 302  150 - 400 K/uL   Neutrophils Relative % 72  43 - 77 %   Neutro Abs 7.4  1.7 - 7.7 K/uL   Lymphocytes Relative 15  12 - 46 %   Lymphs Abs 1.6  0.7 - 4.0 K/uL   Monocytes Relative 12  3 - 12 %   Monocytes Absolute 1.2 (*) 0.1  - 1.0 K/uL   Eosinophils Relative 1  0 - 5 %   Eosinophils Absolute 0.1  0.0 - 0.7 K/uL   Basophils Relative 0  0 - 1 %   Basophils Absolute 0.0  0.0 - 0.1 K/uL  BASIC METABOLIC PANEL     Status: Abnormal   Collection Time    12/12/12  5:10 AM      Result Value Range   Sodium 133 (*) 135 - 145 mEq/L   Potassium 3.9  3.5 - 5.1 mEq/L   Chloride 100  96 - 112 mEq/L   CO2 26  19 - 32 mEq/L   Glucose, Bld 116 (*) 70 - 99 mg/dL   BUN 14  6 - 23 mg/dL   Creatinine, Ser 4.69  0.50 - 1.10 mg/dL   Calcium 9.4  8.4 - 62.9 mg/dL   GFR calc non Af Amer 88 (*) >90 mL/min   GFR calc Af Amer >90  >90 mL/min   Comment: (NOTE)     The eGFR has been calculated using the CKD EPI equation.     This calculation has not been validated in all clinical situations.     eGFR's persistently <90 mL/min signify possible Chronic Kidney     Disease.    Studies/Results: Dg Chest Port 1 View  12/11/2012   CLINICAL DATA:  Line placement.  EXAM: PORTABLE CHEST - 1 VIEW  COMPARISON:  11/26/2012 and CT chest 08/24/2012.  FINDINGS: Right PICC tip projects over the SVC. Nasogastric tube is  followed into the stomach. Left subclavian pacemaker lead tips project over the right atrium and right ventricle.  Trachea is midline. Heart size stable. Minimal biapical pleural thickening. Subsegmental atelectasis at the lung bases, left greater than right. No pleural fluid.  IMPRESSION: 1. Satisfactory tube/line placement. 2. Subsegmental atelectasis at the lung bases, left greater than right.   Electronically Signed   By: Leanna Battles M.D.   On: 12/11/2012 11:21   Dg Abd Portable 2v  12/10/2012   CLINICAL DATA:  Postop abdominal pain.  EXAM: PORTABLE ABDOMEN - 2 VIEW  COMPARISON:  12/09/2012  FINDINGS: Mild right colon distention is stable. There is no convincing obstruction. Findings are consistent with an adynamic ileus as described on the previous day's study. Some air is seen within the rectum.  Surgical staples are unchanged.   IMPRESSION: Persistent postoperative ileus.  No convincing obstruction.   Electronically Signed   By: Amie Portland M.D.   On: 12/10/2012 11:19   Medications: Scheduled Meds: . alvimopan  12 mg Oral BID  . fentaNYL  50-100 mcg Intravenous Once  . lisinopril  10 mg Oral Daily  . pantoprazole (PROTONIX) IV  40 mg Intravenous Q12H   Continuous Infusions: . dextrose 5 % and 0.9 % NaCl with KCl 20 mEq/L 75 mL/hr at 12/12/12 0036   PRN Meds:.acetaminophen, acetaminophen, fentaNYL, hydrALAZINE, menthol-cetylpyridinium, midazolam, morphine injection, phenol, promethazine, sodium chloride  Assessment/Plan:  Principal Problem:  GI bleed  Active Problems:  Pacemaker implanted 08/28/12  Essential hypertension, benign  D (diarrhea)  Colonic mass  Gastric bleed  Ileus, postoperative  Principal Problem:  Abdominal pain -  better with NG tube. Still has not passed flatus. No bowel sounds. NG aspirate is clear now, he positive yesterday.  Continue IV Protonix - ileus persisting Active Problems:  Pacemaker implanted 08/28/12 - functioning normally currently  Essential hypertension, benign - blood pressure mildly elevated systolically, likely secondary to pain  D (diarrhea) - resolved with surgery - has not passed flatus yet  Colonic mass - will continue to check for surgical pathology results. Biopsies from colonoscopy revealed dysplasia  Continue incentive spirometry  Physical therapy as soon as she is able FEN -will keep n.p.o. except for sips of clear liquids.  Start TPN. Continue NG tube.  PICC placed yesterday  Appreciate help from general surgery!!   LOS: 7 days   Pearla Dubonnet, MD 12/12/2012, 7:37 AM

## 2012-12-12 NOTE — Clinical Social Work Psychosocial (Signed)
Clinical Social Work Department BRIEF PSYCHOSOCIAL ASSESSMENT 12/12/2012  Patient:  Tamara [name], Tamara [name]     Account Number:  [id number]     Admit date:  12/04/2012  Clinical Social Worker:  Lavell Luster  Date/Time:  12/12/2012 03:00 PM  Referred by:  Physician  Date Referred:  12/12/2012 Referred for  SNF Placement   Other Referral:   Interview type:  Patient Other interview type:   Patient alert and oriented at time of assessment. Patient's husband was at bedside and was interviewed as well.    PSYCHOSOCIAL DATA Living Status:  HUSBAND Admitted from facility:   Level of care:   Primary support name:  Tamara [name] [phone] or [phone] Primary support relationship to patient:  SPOUSE Degree of support available:   Support is strong. Husabnd is at bedside with patient and is involved with patient's disposition.    CURRENT CONCERNS Current Concerns  Post-Acute Placement   Other Concerns:    SOCIAL WORK ASSESSMENT / PLAN CSW met with patient and patient's husband to discuss recommendation for SNF placement and to explain SNF search process. Patient is agreeable to SNF placement and states that she would prefer a placement close to the hospital. CSW answered patient's and husband's questions. Patient does not report any past SNF stay. Patient plans to return home with husband after SNF stay. Patient prefers private room and understands that this may cut down on SNF options available to her. CSW will continue to follow for DC needs.   Assessment/plan status:  Psychosocial Support/Ongoing Assessment of Needs Other assessment/ plan:   Complete FL2, Fax, PASRR   Information/referral to community resources:   CSW contact information and SNF list left with patient and husband.    PATIENT'S/FAMILY'S RESPONSE TO PLAN OF CARE: Patient is agreeable to SNF placement. Both patient and husband were engaged in assessment. Patient and husband were pleasant and appreciative of  CSW contact. Patient awaits bed offers.    Roddie Mc, [place], [place], [phone]

## 2012-12-12 NOTE — Progress Notes (Signed)
Patient ID: Tamara [name], female   DOB: Jul 07, 1929, 79 y.o.   MRN: [id number] 5 Days Post-Op  Subjective: "I feel rotten"  No flatus.  C/o sea sickness type feeling.  No mobilizing much at all.  Objective: Vital signs in last 24 hours: Temp:  [98.2 F (36.8 C)-98.8 F (37.1 C)] 98.3 F (36.8 C) (11/18 0508) Pulse Rate:  [93-104] 93 (11/18 0745) Resp:  [16-18] 18 (11/18 0508) BP: (146-217)/(50-101) 146/50 mmHg (11/18 0745) SpO2:  [97 %-98 %] 97 % (11/18 0508) Last BM Date: 12/06/12  Intake/Output from previous day: 11/17 0701 - 11/18 0700 In: 1111.3 [P.O.:240; I.V.:871.3] Out: 800 [Urine:350; Emesis/NG output:450] Intake/Output this shift:    PE: Abd: soft, some distention, some body wall edema, hypoactive BS, NGT with just clear output likely from ice chips and water.  Incisions c/d/i with staples  Lab Results:   Recent Labs  12/11/12 0653 12/11/12 1700 12/12/12 0510  WBC 12.1*  --  10.2  HGB 10.7* 9.9* 9.5*  HCT 30.2* 27.7* 28.2*  PLT 351  --  302   BMET  Recent Labs  12/11/12 0653 12/12/12 0510  NA 132* 133*  K 4.1 3.9  CL 98 100  CO2 26 26  GLUCOSE 129* 116*  BUN 12 14  CREATININE 0.58 0.50  CALCIUM 9.7 9.4   PT/INR No results found for this basename: LABPROT, INR,  in the last 72 hours CMP     Component Value Date/Time   NA 133* 12/12/2012 0510   K 3.9 12/12/2012 0510   CL 100 12/12/2012 0510   CO2 26 12/12/2012 0510   GLUCOSE 116* 12/12/2012 0510   BUN 14 12/12/2012 0510   CREATININE 0.50 12/12/2012 0510   CALCIUM 9.4 12/12/2012 0510   PROT 6.3 12/11/2012 0653   ALBUMIN 3.0* 12/11/2012 0653   AST 18 12/11/2012 0653   ALT 17 12/11/2012 0653   ALKPHOS 77 12/11/2012 0653   BILITOT 0.4 12/11/2012 0653   GFRNONAA 88* 12/12/2012 0510   GFRAA >90 12/12/2012 0510   Lipase     Component Value Date/Time   LIPASE 31 11/26/2012 1352       Studies/Results: Dg Chest Port 1 View  12/11/2012   CLINICAL DATA:  Line placement.  EXAM: PORTABLE  CHEST - 1 VIEW  COMPARISON:  11/26/2012 and CT chest 08/24/2012.  FINDINGS: Right PICC tip projects over the SVC. Nasogastric tube is followed into the stomach. Left subclavian pacemaker lead tips project over the right atrium and right ventricle.  Trachea is midline. Heart size stable. Minimal biapical pleural thickening. Subsegmental atelectasis at the lung bases, left greater than right. No pleural fluid.  IMPRESSION: 1. Satisfactory tube/line placement. 2. Subsegmental atelectasis at the lung bases, left greater than right.   Electronically Signed   By: Leanna Battles M.D.   On: 12/11/2012 11:21   Dg Abd Portable 2v  12/10/2012   CLINICAL DATA:  Postop abdominal pain.  EXAM: PORTABLE ABDOMEN - 2 VIEW  COMPARISON:  12/09/2012  FINDINGS: Mild right colon distention is stable. There is no convincing obstruction. Findings are consistent with an adynamic ileus as described on the previous day's study. Some air is seen within the rectum.  Surgical staples are unchanged.  IMPRESSION: Persistent postoperative ileus.  No convincing obstruction.   Electronically Signed   By: Amie Portland M.D.   On: 12/10/2012 11:19    Anti-infectives: Anti-infectives   Start     Dose/Rate Route Frequency Ordered Stop   12/07/12 0600  cefoTEtan (CEFOTAN) 2 g in dextrose 5 % 50 mL IVPB     2 g 100 mL/hr over 30 Minutes Intravenous On call to O.R. 12/06/12 1420 12/07/12 1219   12/05/12 0600  metroNIDAZOLE (FLAGYL) IVPB 500 mg  Status:  Discontinued     500 mg 100 mL/hr over 60 Minutes Intravenous Every 8 hours 12/05/12 0525 12/07/12 0528       Assessment/Plan  1. S/p lap assisted transverse, splenic flexure colectomy, POD 5 2. Post op ileus  Plan: 1. Cont NGT and await ileus resolution.  Films for today are pending 2. Cont mobilization.  Patient needs to mobilize at least TID 3. Agree with TNA while patient unable to take in oral nutrition 4. Will follow.   LOS: 7 days    Rheana Casebolt E 12/12/2012, 9:11  AM Pager: [phone]

## 2012-12-12 NOTE — Progress Notes (Signed)
Wbc normal, ng drainage clear today. I still think she has resolving ileus and will monitor. Agree with above note

## 2012-12-12 NOTE — Plan of Care (Signed)
Problem: Acute Rehab PT Goals(only PT should resolve) Goal: Pt Will Go Up/Down Stairs Outcome: Not Applicable Date Met:  12/12/12 Pt d/c disposition updated to [facility]

## 2012-12-12 NOTE — Progress Notes (Signed)
PARENTERAL NUTRITION CONSULT NOTE - INITIAL  Pharmacy Consult for TPN Indication: Prolonged post-op ileus  Allergies  Allergen Reactions  . Codeine Nausea And Vomiting  . Zofran [Ondansetron Hcl]     Patient Measurements: Height: 5\' 5"  (165.1 cm) Weight: 127 lb 10.3 oz (57.9 kg) IBW/kg (Calculated) : 57  Vital Signs: Temp: 98.3 F (36.8 C) (11/18 0508) Temp src: Oral (11/18 0508) BP: 146/50 mmHg (11/18 0745) Pulse Rate: 93 (11/18 0745) Intake/Output from previous day: 11/17 0701 - 11/18 0700 In: 1111.3 [P.O.:240; I.V.:871.3] Out: 800 [Urine:350; Emesis/NG output:450] Intake/Output from this shift:    Labs:  Recent Labs  12/10/12 0630 12/11/12 0653 12/11/12 1700 12/12/12 0510  WBC 11.3* 12.1*  --  10.2  HGB 10.0* 10.7* 9.9* 9.5*  HCT 28.6* 30.2* 27.7* 28.2*  PLT 305 351  --  302     Recent Labs  12/10/12 0630 12/11/12 0653 12/12/12 0510  NA 128* 132* 133*  K 3.7 4.1 3.9  CL 95* 98 100  CO2 23 26 26   GLUCOSE 151* 129* 116*  BUN 4* 12 14  CREATININE 0.42* 0.58 0.50  CALCIUM 9.4 9.7 9.4  MG  --   --  2.0  PHOS  --   --  2.6  PROT  --  6.3  --   ALBUMIN  --  3.0*  --   AST  --  18  --   ALT  --  17  --   ALKPHOS  --  77  --   BILITOT  --  0.4  --    Estimated Creatinine Clearance: 49.6 ml/min (by C-G formula based on Cr of 0.5).   No results found for this basename: GLUCAP,  in the last 72 hours  Medical History: Past Medical History  Diagnosis Date  . Breast cancer   . HTN (hypertension) August 2014  . Syncope and collapse August 2014    sinus pauses/ PTVDP  . Colitis     Medications:  Prescriptions prior to admission  Medication Sig Dispense Refill  . acetaminophen (TYLENOL) 325 MG tablet Take 2 tablets (650 mg total) by mouth every 4 (four) hours as needed.      . B Complex-C (SUPER B COMPLEX PO) Take by mouth every other day.      . Cholecalciferol (VITAMIN D) 2000 UNITS tablet Take 2,000 Units by mouth daily with breakfast.      .  Cyanocobalamin (VITAMIN B-12 PO) Take by mouth every other day.      Marland Kitchen GLUCOSAMINE-CHONDROITIN PO Take 1 capsule by mouth daily with breakfast.      . lisinopril (PRINIVIL,ZESTRIL) 10 MG tablet Take 1 tablet (10 mg total) by mouth daily.  90 tablet  3  . Multiple Vitamin (MULTIVITAMIN WITH MINERALS) TABS Take 1 tablet by mouth daily with breakfast.      . Multiple Vitamins-Minerals (ANTIOXIDANT FORMULA SG) capsule Take 1 capsule by mouth daily.      . Omega-3 Fatty Acids (OMEGA 3 PO) Take 1 capsule by mouth daily with breakfast.      . Probiotic Product (ALIGN PO) Take 1 capsule by mouth daily with breakfast.      . metroNIDAZOLE (FLAGYL) 500 MG tablet Take 1 tablet (500 mg total) by mouth every 12 (twelve) hours.  8 tablet  0    Insulin Requirements in the past 24 hours:  None  Current Nutrition:  Clear liquid diet  Assessment: Pt s/p lap assisted transverse colectomy on 11/13 for partially obstructin transverse  colon mass. Pt continues with post-op ileus. To begin TPN.  GI: Pt with c/o abd pain, nausea. Abd distended. NGT o/p 450 ml yesterday (trending down some). NG aspirate no longer bloody/brown. Pt continues on clear liquid diet but not taking much   Endo: No h/o DM. A.m. glucoses wnl. Will begin empiric SSI q6h when TPN starts- if maintains well-controlled CBGs once at TPN goal rate, will d/c SSI and CBGs.  Lytes: WNL except for Na slightly low at 133. K 3.9 (would like >4 with ileus)  Renal: SCr stable. UOP not being recorded accurately. MIVF: D5NS with KCl at 75 ml/hr.  Pulm: RA  Cards: h/o HTN. BP elevated. HR ok. Was on lisinopril PTA.  Hepatobil: LFTs wnl at baseline.   Neuro: A&O  ID: No issues  Best Practices: SCDs  TPN Access: PICC  TPN day#: 0  Nutritional Goals:  Await RD recommendations  Plan:  1. Begin clinimix E 5/15 at 40 ml/hr and lipids at 10 ml/hr and advance clinimix to goal rate as tolerated. 2. Change MIVF to D5NS with KCl at 35  ml/hr when TPN hangs 3. Begin empiric sensitive SSI q6h 4. F/u TPN labs 5. F/u RD recommendations  Christoper Fabian, PharmD, BCPS Clinical pharmacist, pager [phone] 12/12/2012,9:01 AM

## 2012-12-13 ENCOUNTER — Inpatient Hospital Stay (HOSPITAL_COMMUNITY): Payer: Medicare Other

## 2012-12-13 DIAGNOSIS — E871 Hypo-osmolality and hyponatremia: Secondary | ICD-10-CM

## 2012-12-13 LAB — PHOSPHORUS: Phosphorus: 2.4 mg/dL (ref 2.3–4.6)

## 2012-12-13 LAB — GLUCOSE, CAPILLARY
Glucose-Capillary: 128 mg/dL — ABNORMAL HIGH (ref 70–99)
Glucose-Capillary: 130 mg/dL — ABNORMAL HIGH (ref 70–99)
Glucose-Capillary: 131 mg/dL — ABNORMAL HIGH (ref 70–99)
Glucose-Capillary: 135 mg/dL — ABNORMAL HIGH (ref 70–99)
Glucose-Capillary: 137 mg/dL — ABNORMAL HIGH (ref 70–99)

## 2012-12-13 LAB — CBC
HCT: 27.6 % — ABNORMAL LOW (ref 36.0–46.0)
Hemoglobin: 9.6 g/dL — ABNORMAL LOW (ref 12.0–15.0)
RBC: 3.12 MIL/uL — ABNORMAL LOW (ref 3.87–5.11)
RDW: 16.6 % — ABNORMAL HIGH (ref 11.5–15.5)

## 2012-12-13 LAB — DIFFERENTIAL
Lymphocytes Relative: 14 % (ref 12–46)
Lymphs Abs: 1.2 10*3/uL (ref 0.7–4.0)
Monocytes Absolute: 1.1 10*3/uL — ABNORMAL HIGH (ref 0.1–1.0)
Monocytes Relative: 12 % (ref 3–12)
Neutro Abs: 6.5 10*3/uL (ref 1.7–7.7)
Neutrophils Relative %: 73 % (ref 43–77)

## 2012-12-13 LAB — COMPREHENSIVE METABOLIC PANEL
ALT: 40 U/L — ABNORMAL HIGH (ref 0–35)
Albumin: 2.9 g/dL — ABNORMAL LOW (ref 3.5–5.2)
Alkaline Phosphatase: 80 U/L (ref 39–117)
BUN: 13 mg/dL (ref 6–23)
CO2: 25 mEq/L (ref 19–32)
GFR calc Af Amer: >90 mL/min (ref >90)
GFR calc non Af Amer: >90 mL/min (ref >90)
Glucose, Bld: 131 mg/dL — ABNORMAL HIGH (ref 70–99)
Potassium: 3.5 mEq/L (ref 3.5–5.1)
Total Protein: 6 g/dL (ref 6.0–8.3)

## 2012-12-13 LAB — TRIGLYCERIDES: Triglycerides: 101 mg/dL (ref <150)

## 2012-12-13 LAB — MAGNESIUM: Magnesium: 2 mg/dL (ref 1.5–2.5)

## 2012-12-13 MED ORDER — FENTANYL CITRATE 0.05 MG/ML IJ SOLN
100.0000 ug | INTRAMUSCULAR | Status: DC | PRN
  Administered 2012-12-13 – 2012-12-15 (×11): 100 ug via INTRAVENOUS
  Filled 2012-12-13 (×11): qty 2

## 2012-12-13 MED ORDER — ACETAMINOPHEN 325 MG PO TABS
650.0000 mg | ORAL_TABLET | Freq: Four times a day (QID) | ORAL | Status: DC
  Administered 2012-12-22 – 2013-01-01 (×25): 650 mg via ORAL
  Filled 2012-12-13 (×25): qty 2

## 2012-12-13 MED ORDER — TRACE MINERALS CR-CU-F-FE-I-MN-MO-SE-ZN IV SOLN
INTRAVENOUS | Status: AC
  Administered 2012-12-13: 18:00:00 via INTRAVENOUS
  Filled 2012-12-13: qty 2000

## 2012-12-13 MED ORDER — TRACE MINERALS CR-CU-F-FE-I-MN-MO-SE-ZN IV SOLN
INTRAVENOUS | Status: DC
  Filled 2012-12-13: qty 1000

## 2012-12-13 MED ORDER — FAT EMULSION 20 % IV EMUL
240.0000 mL | INTRAVENOUS | Status: AC
  Administered 2012-12-13: 18:00:00 240 mL via INTRAVENOUS
  Filled 2012-12-13: qty 250

## 2012-12-13 MED ORDER — ACETAMINOPHEN 650 MG RE SUPP: 650.0000 mg | Freq: Four times a day (QID) | RECTAL | Status: DC

## 2012-12-13 MED ORDER — METHOCARBAMOL 100 MG/ML IJ SOLN
500.0000 mg | Freq: Three times a day (TID) | INTRAVENOUS | Status: DC | PRN
  Administered 2012-12-13 – 2012-12-14 (×2): 1000 mg via INTRAVENOUS
  Filled 2012-12-13 (×4): qty 10

## 2012-12-13 MED ORDER — ENOXAPARIN SODIUM 40 MG/0.4ML ~~LOC~~ SOLN
40.0000 mg | SUBCUTANEOUS | Status: DC
  Administered 2012-12-13 – 2012-12-14 (×2): 40 mg via SUBCUTANEOUS
  Filled 2012-12-13 (×3): qty 0.4

## 2012-12-13 MED ORDER — ACETAMINOPHEN 325 MG PO TABS: 650.0000 mg | ORAL_TABLET | Freq: Four times a day (QID) | ORAL | Status: DC

## 2012-12-13 MED ORDER — ACETAMINOPHEN 650 MG RE SUPP
650.0000 mg | Freq: Four times a day (QID) | RECTAL | Status: DC
  Administered 2012-12-13 – 2012-12-27 (×44): 650 mg via RECTAL
  Filled 2012-12-13 (×79): qty 1

## 2012-12-13 MED ORDER — FAT EMULSION 20 % IV EMUL
240.0000 mL | INTRAVENOUS | Status: DC
  Filled 2012-12-13: qty 250

## 2012-12-13 NOTE — Progress Notes (Signed)
Pt pain is better controlled with robaxin. She currently wants to take a nap. I encouraged the importance of ambulating to help resolve her ileus and pt still refused. Will reassess in an hour.

## 2012-12-13 NOTE — Progress Notes (Signed)
LOS: 8 days   Subjective: POD6 Patient is complaining of severe RLQ pain that is not adequately controlled.  She feels terrible and says she "can't take it anymore." Dr. Kevan Ny at bedside. She does not want any more imaging done. No flatus. Denies nausea, fever, chills, shortness of breath, or chest pain.   Objective: Vital signs in last 24 hours: Temp:  [97.3 F (36.3 C)-97.7 F (36.5 C)] 97.3 F (36.3 C) (11/19 0556) Pulse Rate:  [95-100] 95 (11/19 0556) Resp:  [18-20] 18 (11/19 0556) BP: (152-190)/(52-95) 152/52 mmHg (11/19 0644) SpO2:  [97 %-99 %] 97 % (11/19 0556) Weight:  [59.467 kg (131 lb 1.6 oz)] 59.467 kg (131 lb 1.6 oz) (11/18 0957) Last BM Date: 12/06/12  Intake/Output     11/18 0701 - 11/19 0700 11/19 0701 - 11/20 0700   P.O.     I.V. (mL/kg) 37.3 (0.6)    Total Intake(mL/kg) 37.3 (0.6)    Urine (mL/kg/hr)     Emesis/NG output 400 (0.3)    Total Output 400     Net -362.7          Urine Occurrence 1 x      Laboratory  CBC  Recent Labs  12/12/12 0510 12/13/12 0500  WBC 10.2 8.9  HGB 9.5* 9.6*  HCT 28.2* 27.6*  PLT 302 277   BMET  Recent Labs  12/12/12 0510 12/13/12 0500  NA 133* 132*  K 3.9 3.5  CL 100 98  CO2 26 25  GLUCOSE 116* 131*  BUN 14 13  CREATININE 0.50 0.47*  CALCIUM 9.4 9.2     Physical Exam General appearance: alert, resting in bed, appears very uncomfortable.  Resp: clear to auscultation bilaterally Cardio: regular rate and rhythm GI: no BS, distended, tender diffusely with severe pain over RLQ. NGT with bilious output. Midline wound site clean dry and intact with staples.  Extremities: no edema   Assessment/Plan: S/P lap assisted transverse, splenic flexure colectomy POD 6 Post-op ileus: afebrile, no WBC elevation, labs are reassuring. CT abdomen yesterday showing post-op ileus versus obstruction at the anastomosis. Likely post-op ileus. NGT with bilious output. Continue with TNA and mobilization. Continue to monitor.   PCM on TNA  Maris Berger, PA-S General Surgery  12/13/2012   Physician Assistant Preceptor Note: 1.  Agree with above PA-student note 2.  Pain uncontrolled and patient feels miserable.  Will schedule tylenol rectally, start robaxin to see if we can relax her abdominal muscles, and abdominal binder may help too.  Add ice pack.   3.  Continue NPO until return of bowel function prior to clamping trials and advancing diet, continue TNA 4.  Pt refusing KUB today.  Worry that if her pain doesn't improve she may need another operation to determine what the cause of her pain is.  The CT yesterday showed dilated loops of bowel up to the anastomosis.  Contrast could not adequately reach the anastomosis. 5.  Repeat labs tomorrow 6.  DVT proph: ambulation, SCD's, start lovenox   DORT, [facility] 12/13/2012

## 2012-12-13 NOTE — Progress Notes (Signed)
PARENTERAL NUTRITION CONSULT NOTE - Follow Up  Pharmacy Consult for TPN Indication: Prolonged post-op ileus  Allergies  Allergen Reactions  . Codeine Nausea And Vomiting  . Zofran [Ondansetron Hcl]     Patient Measurements: Height: 5\' 5"  (165.1 cm) Weight: 131 lb 1.6 oz (59.467 kg) IBW/kg (Calculated) : 57  Vital Signs: Temp: 97.3 F (36.3 C) (11/19 0556) Temp src: Oral (11/19 0556) BP: 152/52 mmHg (11/19 0644) Pulse Rate: 95 (11/19 0556) Intake/Output from previous day: 11/18 0701 - 11/19 0700 In: 37.3 [I.V.:37.3] Out: 400 [Emesis/NG output:400] Intake/Output from this shift:  Labs:  Recent Labs  12/11/12 0653 12/11/12 1700 12/12/12 0510 12/13/12 0500  WBC 12.1*  --  10.2 8.9  HGB 10.7* 9.9* 9.5* 9.6*  HCT 30.2* 27.7* 28.2* 27.6*  PLT 351  --  302 277    Recent Labs  12/11/12 0653 12/12/12 0510 12/13/12 0500  NA 132* 133* 132*  K 4.1 3.9 3.5  CL 98 100 98  CO2 26 26 25   GLUCOSE 129* 116* 131*  BUN 12 14 13   CREATININE 0.58 0.50 0.47*  CALCIUM 9.7 9.4 9.2  MG  --  2.0 2.0  PHOS  --  2.6 2.4  PROT 6.3  --  6.0  ALBUMIN 3.0*  --  2.9*  AST 18  --  63*  ALT 17  --  40*  ALKPHOS 77  --  80  BILITOT 0.4  --  0.4  TRIG  --   --  101   Estimated Creatinine Clearance: 49.6 ml/min (by C-G formula based on Cr of 0.47).   Recent Labs  12/13/12 0006 12/13/12 0551  GLUCAP 135* 128*   Medical History: Past Medical History  Diagnosis Date  . Breast cancer   . HTN (hypertension) August 2014  . Syncope and collapse August 2014    sinus pauses/ PTVDP  . Colitis    Medications:  Prescriptions prior to admission  Medication Sig Dispense Refill  . acetaminophen (TYLENOL) 325 MG tablet Take 2 tablets (650 mg total) by mouth every 4 (four) hours as needed.      . B Complex-C (SUPER B COMPLEX PO) Take by mouth every other day.      . Cholecalciferol (VITAMIN D) 2000 UNITS tablet Take 2,000 Units by mouth daily with breakfast.      . Cyanocobalamin  (VITAMIN B-12 PO) Take by mouth every other day.      Marland Kitchen GLUCOSAMINE-CHONDROITIN PO Take 1 capsule by mouth daily with breakfast.      . lisinopril (PRINIVIL,ZESTRIL) 10 MG tablet Take 1 tablet (10 mg total) by mouth daily.  90 tablet  3  . Multiple Vitamin (MULTIVITAMIN WITH MINERALS) TABS Take 1 tablet by mouth daily with breakfast.      . Multiple Vitamins-Minerals (ANTIOXIDANT FORMULA SG) capsule Take 1 capsule by mouth daily.      . Omega-3 Fatty Acids (OMEGA 3 PO) Take 1 capsule by mouth daily with breakfast.      . Probiotic Product (ALIGN PO) Take 1 capsule by mouth daily with breakfast.      . metroNIDAZOLE (FLAGYL) 500 MG tablet Take 1 tablet (500 mg total) by mouth every 12 (twelve) hours.  8 tablet  0   Insulin Requirements in the past 24 hours:  Patient received 2 units SQ insulin  Current Nutrition:  Clear liquid diet  Assessment: Pt s/p lap assisted transverse colectomy on 11/13 for partially obstructin transverse colon mass. Pt continues with post-op ileus. To begin  TPN.  GI: Pt with c/o abd pain, nausea. Abd distended. NGT o/p 400 ml yesterday (trending down some). NG aspirate no longer bloody/brown. Pt continues on clear liquid diet but not taking much.  She tolerated initial TPN without noted complications.   Endo: No h/o DM. A.m. glucoses wnl. Will begin empiric SSI q6h when TPN starts- if maintains well-controlled CBGs once at TPN goal rate, will d/c SSI and CBGs.  CBG's 128-135, controlled currently.  Will titrate TPN to goal.  Lytes: WNL except for Na slightly low at 133. K 3.9 (would like >4 with ileus)  Renal: SCr stable. UOP not being recorded accurately. MIVF: D5NS with KCl at 75 ml/hr.  Pulm: RA  Cards: h/o HTN. BP elevated. HR ok. Was on lisinopril PTA.  Hepatobil: LFTs wnl at baseline.   Neuro: A&O  ID: No issues  Best Practices: SCDs  TPN Access: PICC  TPN day#: 1  Nutritional Goals:  Estimated Nutritional Needs:  Kcal: 1700 - 1950   Protein: at least 87 g protein daily  Fluid: at least 1.7 liters daily  Plan:  1. Will increase clinimix E 5/15 to 80 ml/hr and lipids at 10 ml/hr - which provides a little more protein than RD recommends. 2. Continue MIVF to D5NS with KCl at 35 ml/hr when TPN hangs 3. Begin empiric sensitive SSI q6h 4. F/u TPN labs 5. F/u RD recommendations  Nadara Mustard, PharmD., MS Clinical Pharmacist Pager:  [phone] Thank you for allowing pharmacy to be part of this patients care team. 12/13/2012,7:52 AM

## 2012-12-13 NOTE — Clinical Social Work Placement (Signed)
Clinical Social Work Department CLINICAL SOCIAL WORK PLACEMENT NOTE 12/13/2012  Patient:  TULSI, CROSSETT  Account Number:  [id number] Admit date:  12/04/2012  Clinical Social Worker:  Cherre Blanc, [place]  Date/time:  12/12/2012 06:57 PM  Clinical Social Work is seeking post-discharge placement for this patient at the following level of care:   SKILLED NURSING   (*CSW will update this form in Epic as items are completed)   12/12/2012  Patient/family provided with [facility] Department of Clinical Social Work's list of facilities offering this level of care within the geographic area requested by the patient (or if unable, by the patient's family).  12/12/2012  Patient/family informed of their freedom to choose among providers that offer the needed level of care, that participate in Medicare, Medicaid or managed care program needed by the patient, have an available bed and are willing to accept the patient.  12/12/2012  Patient/family informed of [facility]' ownership interest in [facility], as well as of the fact that they are under no obligation to receive care at this facility.  PASARR submitted to EDS on 12/12/2012 PASARR number received from EDS on 12/12/2012  FL2 transmitted to all facilities in geographic area requested by pt/family on  12/12/2012 FL2 transmitted to all facilities within larger geographic area on   Patient informed that his/her managed care company has contracts with or will negotiate with  certain facilities, including the following:     Patient/family informed of bed offers received:  12/13/2012 Patient chooses bed at  Physician recommends and patient chooses bed at    Patient to be transferred to  on   Patient to be transferred to facility by   The following physician request were entered in Epic:   Additional Comments:   Roddie Mc, Bryon Lions, [phone]

## 2012-12-13 NOTE — Progress Notes (Signed)
6 Days Post-Op  Subjective: Feels miserable today, no flatus or bm, abdomen painful  Objective: Vital signs in last 24 hours: Temp:  [97.3 F (36.3 C)-97.7 F (36.5 C)] 97.3 F (36.3 C) (11/19 0556) Pulse Rate:  [95-100] 95 (11/19 0556) Resp:  [18-20] 18 (11/19 0556) BP: (152-190)/(52-95) 152/52 mmHg (11/19 0644) SpO2:  [97 %-99 %] 97 % (11/19 0556) Last BM Date: 12/06/12  Intake/Output from previous day: 11/18 0701 - 11/19 0700 In: 37.3 [I.V.:37.3] Out: 400 [Emesis/NG output:400] Intake/Output this shift:    General appearance: no distress Resp: diminished breath sounds bibasilar Cardio: regular rate and rhythm GI: wounds clean, no bs, tender diffusely and distended  Lab Results:   Recent Labs  12/12/12 0510 12/13/12 0500  WBC 10.2 8.9  HGB 9.5* 9.6*  HCT 28.2* 27.6*  PLT 302 277   BMET  Recent Labs  12/12/12 0510 12/13/12 0500  NA 133* 132*  K 3.9 3.5  CL 100 98  CO2 26 25  GLUCOSE 116* 131*  BUN 14 13  CREATININE 0.50 0.47*  CALCIUM 9.4 9.2   PT/INR No results found for this basename: LABPROT, INR,  in the last 72 hours ABG No results found for this basename: PHART, PCO2, PO2, HCO3,  in the last 72 hours  Studies/Results: Ct Abdomen Pelvis W Contrast  12/12/2012   CLINICAL DATA:  Recent surgery for ileus  EXAM: CT ABDOMEN AND PELVIS WITH CONTRAST  TECHNIQUE: Multidetector CT imaging of the abdomen and pelvis was performed using the standard protocol following bolus administration of intravenous contrast.  CONTRAST:  80mL OMNIPAQUE IOHEXOL 300 MG/ML  SOLN  COMPARISON:  Plain films 12/12/2012, CT 11/26/2012  FINDINGS: There is a small right effusion with associated passive atelectasis. No pericardial fluid.  NG tube extends to the stomach. Oral contrast was administered through the NG tube and fills the stomach. The proximal jejunum is normal caliber. Beginning in the proximal to mid ileum there dilated loops of small bowel with poor progression of the  oral contrast. These bowel loops measure up to 3 cm. There is a fluid within the distal small bowel leading up to the terminal ileum / cecum which is mildly distended to 3 cm. There is a large volume of fluid in the ascending colon and cecum. There is gas distension of the transverse colon with fluid up to the surgical anastomosis in the left upper abdomen. Distal to this anastomosis, the bowel and is collapsed. Rectal contrast was administered. The contrast of flowed only to the level of the sigmoid colon.  There is no intraperitoneal free air. Small amount intraperitoneal free fluid is present. No focal hepatic lesion. No portal venous gas. Post cholecystectomy anatomy. The pancreas, spleen, adrenal glands, kidneys are normal.  No free fluid the pelvis. The bladder contains a small amount of gas presumably related to recent catheterization. The uterus demonstrates several calcified leiomyoma. No pelvic lymphadenopathy. Degenerate changes of the spine.  IMPRESSION: 1. Poor progression of the oral contrast through the small bowel. Loops of small bowel are dilated and fluid-filled. The colon is also fluid-filled up to the anastomosis. Findings are suggestive of postoperative ileus versus obstruction at the anastomosis. Recommend serial plain films to evaluate progression of the oral contrast.  Findings conveyed toRamirezon 12/12/2012  at20:20.   Electronically Signed   By: Genevive Bi M.D.   On: 12/12/2012 20:37   Dg Abd 2 Views  12/12/2012   CLINICAL DATA:  Recent bowel resection.  Abdominal distention.  EXAM: ABDOMEN -  2 VIEW  COMPARISON:  12/09/2012.  FINDINGS: Surgical staples are noted over the abdomen and pelvis. NG tube noted projected over the stomach. Persistent distended loops of small and large bowel containing air-fluid levels are noted. Bowel distention has progressed slightly from prior study. Cecum measures approximately 10.5 cm in diameter. No free air is identified. Lung bases clear. No  acute bony abnormality. Degenerative changes lumbar spine and right hip. Vascular calcification.  IMPRESSION: Findings most consistent with progressive adynamic ileus. Follow-up abdominal series suggested. NG tube noted projected over the stomach.   Electronically Signed   By: Maisie Fus  Register   On: 12/12/2012 15:44    Anti-infectives: Anti-infectives   Start     Dose/Rate Route Frequency Ordered Stop   12/07/12 0600  cefoTEtan (CEFOTAN) 2 g in dextrose 5 % 50 mL IVPB     2 g 100 mL/hr over 30 Minutes Intravenous On call to O.R. 12/06/12 1420 12/07/12 1219   12/05/12 0600  metroNIDAZOLE (FLAGYL) IVPB 500 mg  Status:  Discontinued     500 mg 100 mL/hr over 60 Minutes Intravenous Every 8 hours 12/05/12 0525 12/07/12 0528      Assessment/Plan: POD 6 lap colectomy 1. Iv pain meds 2. I have reviewed ct, contrast does not get to anastomosis from either side, it appears there is ileus but there might be obstruction at anastomosis, there is no evidence of leak or abscess, wbc normal.  I think there is small chance she could need to get reexplored but will continue to monitor conservatively right now.  Will check films today. Discussed plan with patient   [facility] 12/13/2012

## 2012-12-13 NOTE — Progress Notes (Signed)
Paged Unknown Jim, PA with critical results from Abdomen x-ray  11/18: Cecum was dilated to 9.9 cm. 11/19: Cecum has dilated to 11.4 cm.

## 2012-12-13 NOTE — Progress Notes (Signed)
Subjective: Tamara [name] is miserable this morning.  She does not want to go on but on the whole her numbers look good but her CT scan is concerning for possible obstruction at the anastomosis site in the transverse colon.  Exam is consistent with ileus, not obstruction  Objective: Weight change:   Intake/Output Summary (Last 24 hours) at 12/13/12 0757 Last data filed at 12/13/12 0654  Gross per 24 hour  Intake  37.33 ml  Output    400 ml  Net -362.67 ml   Filed Vitals:   12/12/12 1425 12/12/12 2054 12/13/12 0556 12/13/12 0644  BP: 189/77 158/72 190/95 152/52  Pulse: 100 95 95   Temp: 97.7 F (36.5 C) 97.4 F (36.3 C) 97.3 F (36.3 C)   TempSrc: Oral Oral Oral   Resp: 18 20 18    Height:      Weight:      SpO2: 99% 97% 97%     General Appearance: Eyes closed, conversive, feels very weak, miserable  NG aspirate: Bile-colored, clear  Lungs: Clear to auscultation bilaterally, respirations unlabored  Heart: Regular rate and rhythm, S1 and S2 normal, no murmur, rub or gallop  Abdomen: Soft with tenderness diffusely. Bowel sounds are quiet consistent with ileus Extremities: Extremities normal, atraumatic, no cyanosis or edema  Neuro: Nonfocal, lethargic   Lab Results: Results for orders placed during the hospital encounter of 12/05/12 (from the past 48 hour(s))  OCCULT BLOOD GASTRIC / DUODENUM (SPECIMEN CUP)     Status: Abnormal   Collection Time    12/11/12 12:03 PM      Result Value Range   pH, Gastric NOT DONE     Occult Blood, Gastric POSITIVE (*) NEGATIVE  HEMOGLOBIN AND HEMATOCRIT, BLOOD     Status: Abnormal   Collection Time    12/11/12  5:00 PM      Result Value Range   Hemoglobin 9.9 (*) 12.0 - 15.0 g/dL   HCT 16.1 (*) 09.6 - 04.5 %  CBC WITH DIFFERENTIAL     Status: Abnormal   Collection Time    12/12/12  5:10 AM      Result Value Range   WBC 10.2  4.0 - 10.5 K/uL   RBC 3.19 (*) 3.87 - 5.11 MIL/uL   Hemoglobin 9.5 (*) 12.0 - 15.0 g/dL   HCT 40.9 (*) 81.1 - 91.4  %   MCV 88.4  78.0 - 100.0 fL   MCH 29.8  26.0 - 34.0 pg   MCHC 33.7  30.0 - 36.0 g/dL   RDW 78.2 (*) 95.6 - 21.3 %   Platelets 302  150 - 400 K/uL   Neutrophils Relative % 72  43 - 77 %   Neutro Abs 7.4  1.7 - 7.7 K/uL   Lymphocytes Relative 15  12 - 46 %   Lymphs Abs 1.6  0.7 - 4.0 K/uL   Monocytes Relative 12  3 - 12 %   Monocytes Absolute 1.2 (*) 0.1 - 1.0 K/uL   Eosinophils Relative 1  0 - 5 %   Eosinophils Absolute 0.1  0.0 - 0.7 K/uL   Basophils Relative 0  0 - 1 %   Basophils Absolute 0.0  0.0 - 0.1 K/uL  BASIC METABOLIC PANEL     Status: Abnormal   Collection Time    12/12/12  5:10 AM      Result Value Range   Sodium 133 (*) 135 - 145 mEq/L   Potassium 3.9  3.5 - 5.1 mEq/L  Chloride 100  96 - 112 mEq/L   CO2 26  19 - 32 mEq/L   Glucose, Bld 116 (*) 70 - 99 mg/dL   BUN 14  6 - 23 mg/dL   Creatinine, Ser 9.62  0.50 - 1.10 mg/dL   Calcium 9.4  8.4 - 95.2 mg/dL   GFR calc non Af Amer 88 (*) >90 mL/min   GFR calc Af Amer >90  >90 mL/min   Comment: (NOTE)     The eGFR has been calculated using the CKD EPI equation.     This calculation has not been validated in all clinical situations.     eGFR's persistently <90 mL/min signify possible Chronic Kidney     Disease.  MAGNESIUM     Status: None   Collection Time    12/12/12  5:10 AM      Result Value Range   Magnesium 2.0  1.5 - 2.5 mg/dL  PHOSPHORUS     Status: None   Collection Time    12/12/12  5:10 AM      Result Value Range   Phosphorus 2.6  2.3 - 4.6 mg/dL  GLUCOSE, CAPILLARY     Status: Abnormal   Collection Time    12/13/12 12:06 AM      Result Value Range   Glucose-Capillary 135 (*) 70 - 99 mg/dL   Comment 1 Notify RN     Comment 2 Documented in Chart    COMPREHENSIVE METABOLIC PANEL     Status: Abnormal   Collection Time    12/13/12  5:00 AM      Result Value Range   Sodium 132 (*) 135 - 145 mEq/L   Potassium 3.5  3.5 - 5.1 mEq/L   Chloride 98  96 - 112 mEq/L   CO2 25  19 - 32 mEq/L   Glucose,  Bld 131 (*) 70 - 99 mg/dL   BUN 13  6 - 23 mg/dL   Creatinine, Ser 8.41 (*) 0.50 - 1.10 mg/dL   Calcium 9.2  8.4 - 32.4 mg/dL   Total Protein 6.0  6.0 - 8.3 g/dL   Albumin 2.9 (*) 3.5 - 5.2 g/dL   AST 63 (*) 0 - 37 U/L   ALT 40 (*) 0 - 35 U/L   Alkaline Phosphatase 80  39 - 117 U/L   Total Bilirubin 0.4  0.3 - 1.2 mg/dL   GFR calc non Af Amer >90  >90 mL/min   GFR calc Af Amer >90  >90 mL/min   Comment: (NOTE)     The eGFR has been calculated using the CKD EPI equation.     This calculation has not been validated in all clinical situations.     eGFR's persistently <90 mL/min signify possible Chronic Kidney     Disease.  MAGNESIUM     Status: None   Collection Time    12/13/12  5:00 AM      Result Value Range   Magnesium 2.0  1.5 - 2.5 mg/dL  PHOSPHORUS     Status: None   Collection Time    12/13/12  5:00 AM      Result Value Range   Phosphorus 2.4  2.3 - 4.6 mg/dL  CBC     Status: Abnormal   Collection Time    12/13/12  5:00 AM      Result Value Range   WBC 8.9  4.0 - 10.5 K/uL   RBC 3.12 (*) 3.87 - 5.11 MIL/uL   Hemoglobin  9.6 (*) 12.0 - 15.0 g/dL   HCT 16.1 (*) 09.6 - 04.5 %   MCV 88.5  78.0 - 100.0 fL   MCH 30.8  26.0 - 34.0 pg   MCHC 34.8  30.0 - 36.0 g/dL   RDW 40.9 (*) 81.1 - 91.4 %   Platelets 277  150 - 400 K/uL  DIFFERENTIAL     Status: Abnormal   Collection Time    12/13/12  5:00 AM      Result Value Range   Neutrophils Relative % 73  43 - 77 %   Neutro Abs 6.5  1.7 - 7.7 K/uL   Lymphocytes Relative 14  12 - 46 %   Lymphs Abs 1.2  0.7 - 4.0 K/uL   Monocytes Relative 12  3 - 12 %   Monocytes Absolute 1.1 (*) 0.1 - 1.0 K/uL   Eosinophils Relative 1  0 - 5 %   Eosinophils Absolute 0.1  0.0 - 0.7 K/uL   Basophils Relative 0  0 - 1 %   Basophils Absolute 0.0  0.0 - 0.1 K/uL  TRIGLYCERIDES     Status: None   Collection Time    12/13/12  5:00 AM      Result Value Range   Triglycerides 101  <150 mg/dL  GLUCOSE, CAPILLARY     Status: Abnormal   Collection  Time    12/13/12  5:51 AM      Result Value Range   Glucose-Capillary 128 (*) 70 - 99 mg/dL   Comment 1 Notify RN     Comment 2 Documented in Chart      Studies/Results: Ct Abdomen Pelvis W Contrast  12/12/2012   CLINICAL DATA:  Recent surgery for ileus  EXAM: CT ABDOMEN AND PELVIS WITH CONTRAST  TECHNIQUE: Multidetector CT imaging of the abdomen and pelvis was performed using the standard protocol following bolus administration of intravenous contrast.  CONTRAST:  80mL OMNIPAQUE IOHEXOL 300 MG/ML  SOLN  COMPARISON:  Plain films 12/12/2012, CT 11/26/2012  FINDINGS: There is a small right effusion with associated passive atelectasis. No pericardial fluid.  NG tube extends to the stomach. Oral contrast was administered through the NG tube and fills the stomach. The proximal jejunum is normal caliber. Beginning in the proximal to mid ileum there dilated loops of small bowel with poor progression of the oral contrast. These bowel loops measure up to 3 cm. There is a fluid within the distal small bowel leading up to the terminal ileum / cecum which is mildly distended to 3 cm. There is a large volume of fluid in the ascending colon and cecum. There is gas distension of the transverse colon with fluid up to the surgical anastomosis in the left upper abdomen. Distal to this anastomosis, the bowel and is collapsed. Rectal contrast was administered. The contrast of flowed only to the level of the sigmoid colon.  There is no intraperitoneal free air. Small amount intraperitoneal free fluid is present. No focal hepatic lesion. No portal venous gas. Post cholecystectomy anatomy. The pancreas, spleen, adrenal glands, kidneys are normal.  No free fluid the pelvis. The bladder contains a small amount of gas presumably related to recent catheterization. The uterus demonstrates several calcified leiomyoma. No pelvic lymphadenopathy. Degenerate changes of the spine.  IMPRESSION: 1. Poor progression of the oral contrast  through the small bowel. Loops of small bowel are dilated and fluid-filled. The colon is also fluid-filled up to the anastomosis. Findings are suggestive of postoperative ileus versus obstruction  at the anastomosis. Recommend serial plain films to evaluate progression of the oral contrast.  Findings conveyed toRamirezon 12/12/2012  at20:20.   Electronically Signed   By: Genevive Bi M.D.   On: 12/12/2012 20:37   Dg Chest Port 1 View  12/11/2012   CLINICAL DATA:  Line placement.  EXAM: PORTABLE CHEST - 1 VIEW  COMPARISON:  11/26/2012 and CT chest 08/24/2012.  FINDINGS: Right PICC tip projects over the SVC. Nasogastric tube is followed into the stomach. Left subclavian pacemaker lead tips project over the right atrium and right ventricle.  Trachea is midline. Heart size stable. Minimal biapical pleural thickening. Subsegmental atelectasis at the lung bases, left greater than right. No pleural fluid.  IMPRESSION: 1. Satisfactory tube/line placement. 2. Subsegmental atelectasis at the lung bases, left greater than right.   Electronically Signed   By: Leanna Battles M.D.   On: 12/11/2012 11:21   Dg Abd 2 Views  12/12/2012   CLINICAL DATA:  Recent bowel resection.  Abdominal distention.  EXAM: ABDOMEN - 2 VIEW  COMPARISON:  12/09/2012.  FINDINGS: Surgical staples are noted over the abdomen and pelvis. NG tube noted projected over the stomach. Persistent distended loops of small and large bowel containing air-fluid levels are noted. Bowel distention has progressed slightly from prior study. Cecum measures approximately 10.5 cm in diameter. No free air is identified. Lung bases clear. No acute bony abnormality. Degenerative changes lumbar spine and right hip. Vascular calcification.  IMPRESSION: Findings most consistent with progressive adynamic ileus. Follow-up abdominal series suggested. NG tube noted projected over the stomach.   Electronically Signed   By: Maisie Fus  Register   On: 12/12/2012 15:44    Medications: Scheduled Meds: . fentaNYL  50-100 mcg Intravenous Once  . insulin aspart  0-9 Units Subcutaneous Q6H  . pantoprazole (PROTONIX) IV  40 mg Intravenous Q12H   Continuous Infusions: . dextrose 5 % and 0.9 % NaCl with KCl 40 mEq/L 35 mL/hr at 12/12/12 1715  . Marland KitchenTPN (CLINIMIX-E) Adult 40 mL/hr at 12/12/12 1726   And  . fat emulsion 240 mL (12/12/12 1726)   PRN Meds:.acetaminophen, acetaminophen, fentaNYL, hydrALAZINE, menthol-cetylpyridinium, midazolam, morphine injection, phenol, promethazine, sodium chloride  Assessment/Plan:  Principal Problem:  Abdominal pain - persistent and severe.  CT scan with possible obstruction at the anastomotic site.  Still has not passed flatus. No bowel sounds. NG aspirate is clear now, heme positive 2 days ago. Continue IV Protonix - ileus persisting - laboratories are okay and suspect that she does not have gangrenous bowel at this time.  Situation very tenuous Active Problems:  Pacemaker implanted 08/28/12 - functioning normally currently  Essential hypertension, benign - blood pressure mildly elevated systolically, likely secondary to pain  Colonic mass - diagnostic of adenocarcinoma  Continue incentive spirometry  Physical therapy as soon as she is able  FEN - continue TPN. Continue NG tube.  Disposition: Patient is critically ill and LTAC is not appropriate at all for this patient!!     LOS: 8 days   Pearla Dubonnet, MD 12/13/2012, 7:57 AM

## 2012-12-13 NOTE — Progress Notes (Signed)
Pt able to walk 590ft. Patient states she is "feeling so much better". Patient says that Robaxin and ice therapy were very helpful in managing her pain.

## 2012-12-13 NOTE — Progress Notes (Signed)
At 2:22pm, Dr.Wakefield's office was called. Barnetta Chapel was paged. Waiting on response. Will continue to monitor pt.

## 2012-12-13 NOTE — Progress Notes (Signed)
At 2:29, PA Megan called back. Instructed to continue care as is. Will continue to monitor pt.

## 2012-12-14 ENCOUNTER — Inpatient Hospital Stay (HOSPITAL_COMMUNITY): Payer: Medicare Other

## 2012-12-14 LAB — COMPREHENSIVE METABOLIC PANEL
AST: 41 U/L — ABNORMAL HIGH (ref 0–37)
Albumin: 2.8 g/dL — ABNORMAL LOW (ref 3.5–5.2)
BUN: 17 mg/dL (ref 6–23)
CO2: 23 mEq/L (ref 19–32)
Calcium: 8.8 mg/dL (ref 8.4–10.5)
Creatinine, Ser: 0.44 mg/dL — ABNORMAL LOW (ref 0.50–1.10)
GFR calc non Af Amer: >90 mL/min (ref >90)
Sodium: 129 mEq/L — ABNORMAL LOW (ref 135–145)
Total Bilirubin: 0.3 mg/dL (ref 0.3–1.2)
Total Protein: 5.8 g/dL — ABNORMAL LOW (ref 6.0–8.3)

## 2012-12-14 LAB — CBC WITH DIFFERENTIAL/PLATELET
Basophils Absolute: 0 10*3/uL (ref 0.0–0.1)
Basophils Relative: 0 % (ref 0–1)
Eosinophils Absolute: 0.1 10*3/uL (ref 0.0–0.7)
Eosinophils Relative: 1 % (ref 0–5)
HCT: 27.8 % — ABNORMAL LOW (ref 36.0–46.0)
Hemoglobin: 9.8 g/dL — ABNORMAL LOW (ref 12.0–15.0)
Lymphs Abs: 1.2 10*3/uL (ref 0.7–4.0)
MCH: 30.9 pg (ref 26.0–34.0)
MCHC: 35.3 g/dL (ref 30.0–36.0)
MCV: 87.7 fL (ref 78.0–100.0)
Monocytes Absolute: 1 10*3/uL (ref 0.1–1.0)
Monocytes Relative: 11 % (ref 3–12)
Neutrophils Relative %: 74 % (ref 43–77)
Platelets: 271 10*3/uL (ref 150–400)
RBC: 3.17 MIL/uL — ABNORMAL LOW (ref 3.87–5.11)

## 2012-12-14 LAB — MAGNESIUM: Magnesium: 2 mg/dL (ref 1.5–2.5)

## 2012-12-14 LAB — GLUCOSE, CAPILLARY: Glucose-Capillary: 132 mg/dL — ABNORMAL HIGH (ref 70–99)

## 2012-12-14 LAB — PHOSPHORUS: Phosphorus: 2.6 mg/dL (ref 2.3–4.6)

## 2012-12-14 MED ORDER — IOHEXOL 300 MG/ML  SOLN
450.0000 mL | Freq: Once | INTRAMUSCULAR | Status: AC | PRN
  Administered 2012-12-14: 13:00:00 450 mL

## 2012-12-14 MED ORDER — TRACE MINERALS CR-CU-F-FE-I-MN-MO-SE-ZN IV SOLN
INTRAVENOUS | Status: AC
  Administered 2012-12-14: 18:00:00 via INTRAVENOUS
  Filled 2012-12-14: qty 2000

## 2012-12-14 MED ORDER — BISACODYL 10 MG RE SUPP: 10.0000 mg | Freq: Every day | RECTAL | Status: DC | PRN

## 2012-12-14 MED ORDER — FAT EMULSION 20 % IV EMUL
240.0000 mL | INTRAVENOUS | Status: AC
  Administered 2012-12-14: 18:00:00 240 mL via INTRAVENOUS
  Filled 2012-12-14: qty 250

## 2012-12-14 MED ORDER — MORPHINE SULFATE 4 MG/ML IJ SOLN
4.0000 mg | INTRAMUSCULAR | Status: DC | PRN
  Administered 2012-12-14: 4 mg via INTRAVENOUS
  Filled 2012-12-14: qty 1

## 2012-12-14 NOTE — Progress Notes (Signed)
Pt expresses to RN that she can no longer take the pain she is in - she feels hopeless and would like to die. Pt also told RN about her discussion with Dr Kevan Ny concerning her rights to be DNR. Pt express that she and her husband have spoken about DNR status. Patient would like to change her status to DNR.

## 2012-12-14 NOTE — Progress Notes (Signed)
Pt had a bowel movement 11/20-black and loose, small amount.  Will continue to monitor.

## 2012-12-14 NOTE — Progress Notes (Signed)
PT Cancellation Note  Patient Details Name: MAIMUNA LEAMAN MRN: [id number] DOB: [birth year]   Cancelled Treatment:    Reason Eval/Treat Not Completed: Patient at procedure or test/unavailable.  Nurse requested to hold off as pt about to leave floor for x-ray of colon.   Dietrich Samuelson LUBECK 12/14/2012, 12:38 PM

## 2012-12-14 NOTE — Progress Notes (Signed)
7 Days Post-Op  Subjective: Pt's pain is controlled, but still significant when the pain meds wear off.  The tylenol and robaxin are helping significantly, but still very distended.  Ambulated OOB 2x yesterday, none today yet.  Dr. Kevan Ny increased her pain medication.  Husband at bedside thankful for her care.    Objective: Vital signs in last 24 hours: Temp:  [97.5 F (36.4 C)-98.7 F (37.1 C)] 97.5 F (36.4 C) (11/20 0516) Pulse Rate:  [91-104] 102 (11/20 0516) Resp:  [16-18] 18 (11/20 0516) BP: (137-192)/(64-101) 137/70 mmHg (11/20 0516) SpO2:  [97 %-98 %] 98 % (11/20 0516) Weight:  [142 lb 8 oz (64.638 kg)] 142 lb 8 oz (64.638 kg) (11/20 0947) Last BM Date: 12/06/12  Intake/Output from previous day:   Intake/Output this shift:    PE: Gen:  Alert, NAD, pleasant Abd: Soft, severely distended, tender diffusely, +BS,  incisions C/D/I with staples  Lab Results:   Recent Labs  12/12/12 0510 12/13/12 0500  WBC 10.2 8.9  HGB 9.5* 9.6*  HCT 28.2* 27.6*  PLT 302 277   BMET  Recent Labs  12/13/12 0500 12/14/12 0505  NA 132* 129*  K 3.5 3.5  CL 98 97  CO2 25 23  GLUCOSE 131* 140*  BUN 13 17  CREATININE 0.47* 0.44*  CALCIUM 9.2 8.8   PT/INR No results found for this basename: LABPROT, INR,  in the last 72 hours CMP     Component Value Date/Time   NA 129* 12/14/2012 0505   K 3.5 12/14/2012 0505   CL 97 12/14/2012 0505   CO2 23 12/14/2012 0505   GLUCOSE 140* 12/14/2012 0505   BUN 17 12/14/2012 0505   CREATININE 0.44* 12/14/2012 0505   CALCIUM 8.8 12/14/2012 0505   PROT 5.8* 12/14/2012 0505   ALBUMIN 2.8* 12/14/2012 0505   AST 41* 12/14/2012 0505   ALT 36* 12/14/2012 0505   ALKPHOS 79 12/14/2012 0505   BILITOT 0.3 12/14/2012 0505   GFRNONAA >90 12/14/2012 0505   GFRAA >90 12/14/2012 0505   Lipase     Component Value Date/Time   LIPASE 31 11/26/2012 1352       Studies/Results: Ct Abdomen Pelvis W Contrast  12/12/2012   CLINICAL DATA:  Recent  surgery for ileus  EXAM: CT ABDOMEN AND PELVIS WITH CONTRAST  TECHNIQUE: Multidetector CT imaging of the abdomen and pelvis was performed using the standard protocol following bolus administration of intravenous contrast.  CONTRAST:  80mL OMNIPAQUE IOHEXOL 300 MG/ML  SOLN  COMPARISON:  Plain films 12/12/2012, CT 11/26/2012  FINDINGS: There is a small right effusion with associated passive atelectasis. No pericardial fluid.  NG tube extends to the stomach. Oral contrast was administered through the NG tube and fills the stomach. The proximal jejunum is normal caliber. Beginning in the proximal to mid ileum there dilated loops of small bowel with poor progression of the oral contrast. These bowel loops measure up to 3 cm. There is a fluid within the distal small bowel leading up to the terminal ileum / cecum which is mildly distended to 3 cm. There is a large volume of fluid in the ascending colon and cecum. There is gas distension of the transverse colon with fluid up to the surgical anastomosis in the left upper abdomen. Distal to this anastomosis, the bowel and is collapsed. Rectal contrast was administered. The contrast of flowed only to the level of the sigmoid colon.  There is no intraperitoneal free air. Small amount intraperitoneal free fluid is  present. No focal hepatic lesion. No portal venous gas. Post cholecystectomy anatomy. The pancreas, spleen, adrenal glands, kidneys are normal.  No free fluid the pelvis. The bladder contains a small amount of gas presumably related to recent catheterization. The uterus demonstrates several calcified leiomyoma. No pelvic lymphadenopathy. Degenerate changes of the spine.  IMPRESSION: 1. Poor progression of the oral contrast through the small bowel. Loops of small bowel are dilated and fluid-filled. The colon is also fluid-filled up to the anastomosis. Findings are suggestive of postoperative ileus versus obstruction at the anastomosis. Recommend serial plain films to  evaluate progression of the oral contrast.  Findings conveyed toRamirezon 12/12/2012  at20:20.   Electronically Signed   By: Genevive Bi M.D.   On: 12/12/2012 20:37   Dg Abd 2 Views  12/12/2012   CLINICAL DATA:  Recent bowel resection.  Abdominal distention.  EXAM: ABDOMEN - 2 VIEW  COMPARISON:  12/09/2012.  FINDINGS: Surgical staples are noted over the abdomen and pelvis. NG tube noted projected over the stomach. Persistent distended loops of small and large bowel containing air-fluid levels are noted. Bowel distention has progressed slightly from prior study. Cecum measures approximately 10.5 cm in diameter. No free air is identified. Lung bases clear. No acute bony abnormality. Degenerative changes lumbar spine and right hip. Vascular calcification.  IMPRESSION: Findings most consistent with progressive adynamic ileus. Follow-up abdominal series suggested. NG tube noted projected over the stomach.   Electronically Signed   By: Maisie Fus  Register   On: 12/12/2012 15:44   Dg Abd Portable 2v  12/13/2012   CLINICAL DATA:  Colitis, followup contrast question ileus versus obstruction  EXAM: PORTABLE ABDOMEN - 2 VIEW  COMPARISON:  Radiographs and CT abdomen/pelvis of 12/12/2012  FINDINGS: Tip of nasogastric tube projects over stomach.  Skin clips project over mid abdomen.  Surgical clips right upper quadrant likely related to cholecystectomy.  Gaseous distention of cecum up to 11.4 cm diameter.  Gaseous dilatation of small bowel loops.  Findings may represent ileus or colonic obstruction.  No definite free intraperitoneal air or bowel wall thickening.  Bones appear severely demineralized with advanced osteoarthritic changes of the right hip joint.  Excreted contrast noted within urinary bladder.  IMPRESSION: Persistent dilatation of large and small bowel loops question ileus versus obstruction.  Cecum now measures 11.4 cm diameter, increased from the 9.9 cm on the previous CT.  Findings called to patient's  nurse Raoul Pitch on [address] on 12/13/2012 at 1407 hr.   Electronically Signed   By: Ulyses Southward M.D.   On: 12/13/2012 14:07    Anti-infectives: Anti-infectives   Start     Dose/Rate Route Frequency Ordered Stop   12/07/12 0600  cefoTEtan (CEFOTAN) 2 g in dextrose 5 % 50 mL IVPB     2 g 100 mL/hr over 30 Minutes Intravenous On call to O.R. 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 Not significantly improved today 2. Continue IV pain meds, robaxin, tylenol, and ice pack 3. Continue NPO until return of bowel function prior to clamping trials and advancing diet, continue TNA  4. Ordered a DG  colon water soluble enema to better evaluate the anastomosis site since CT 2 days ago wasn't adequate 5. Repeat labs tomorrow  6. DVT proph: ambulation, SCD's, lovenox 7. If her anastomosis site is not patient will likely need to go back to the OR for repeat surgery.  The patient is very hesitant to do this.    LOS: 9 days    DORT, Caoimhe Damron 12/14/2012, 10:08 AM Pager: [phone]

## 2012-12-14 NOTE — Clinical Social Work Placement (Signed)
Per RNCM patient will likely DC with TPN. CSW has updated SNFs as most SNFs are not certified to provide this service.  Roddie Mc, [place], [place], [phone]

## 2012-12-14 NOTE — Progress Notes (Addendum)
Subjective: Jolita again is miserable this morning but surprisingly she felt quite good yesterday afternoon.  She did have a bowel movement this morning but it was loose and black.  Hemoccult pending.  Abdomen still distended with ileus.  She was to be a DO NOT RESUSCITATE and pull back on all therapy though I am not sure she is making the right decision because she may be close to starting to recover.  Will increase her narcotics and continued TPN today.  She refuses any additional surgery.  Objective: Weight change:  No intake or output data in the 24 hours ending 12/14/12 0716 Filed Vitals:   12/13/12 2245 12/14/12 0100 12/14/12 0338 12/14/12 0516  BP: 176/101 192/82 142/64 137/70  Pulse:  103 104 102  Temp:    97.5 F (36.4 C)  TempSrc:    Oral  Resp:    18  Height:      Weight:      SpO2:    98%    General Appearance: Eyes closed, conversive, feels very weak, miserable  NG aspirate: Bile-colored, clear  Lungs: Clear to auscultation bilaterally, respirations unlabored  Heart: Regular rate and rhythm, S1 and S2 normal, no murmur, rub or gallop  Abdomen: Soft with tenderness diffusely. Bowel sounds are quiet consistent with ileus  Extremities: Extremities normal, atraumatic, no cyanosis or edema  Neuro: Nonfocal, lethargic   Lab Results: Results for orders placed during the hospital encounter of 12/05/12 (from the past 48 hour(s))  GLUCOSE, CAPILLARY     Status: Abnormal   Collection Time    12/13/12 12:06 AM      Result Value Range   Glucose-Capillary 135 (*) 70 - 99 mg/dL   Comment 1 Notify RN     Comment 2 Documented in Chart    COMPREHENSIVE METABOLIC PANEL     Status: Abnormal   Collection Time    12/13/12  5:00 AM      Result Value Range   Sodium 132 (*) 135 - 145 mEq/L   Potassium 3.5  3.5 - 5.1 mEq/L   Chloride 98  96 - 112 mEq/L   CO2 25  19 - 32 mEq/L   Glucose, Bld 131 (*) 70 - 99 mg/dL   BUN 13  6 - 23 mg/dL   Creatinine, Ser 1.61 (*) 0.50 - 1.10 mg/dL    Calcium 9.2  8.4 - 09.6 mg/dL   Total Protein 6.0  6.0 - 8.3 g/dL   Albumin 2.9 (*) 3.5 - 5.2 g/dL   AST 63 (*) 0 - 37 U/L   ALT 40 (*) 0 - 35 U/L   Alkaline Phosphatase 80  39 - 117 U/L   Total Bilirubin 0.4  0.3 - 1.2 mg/dL   GFR calc non Af Amer >90  >90 mL/min   GFR calc Af Amer >90  >90 mL/min   Comment: (NOTE)     The eGFR has been calculated using the CKD EPI equation.     This calculation has not been validated in all clinical situations.     eGFR's persistently <90 mL/min signify possible Chronic Kidney     Disease.  PREALBUMIN     Status: Abnormal   Collection Time    12/13/12  5:00 AM      Result Value Range   Prealbumin 17.5 (*) 17.0 - 34.0 mg/dL   Comment: Performed at [facility]  MAGNESIUM     Status: None   Collection Time    12/13/12  5:00  AM      Result Value Range   Magnesium 2.0  1.5 - 2.5 mg/dL  PHOSPHORUS     Status: None   Collection Time    12/13/12  5:00 AM      Result Value Range   Phosphorus 2.4  2.3 - 4.6 mg/dL  CBC     Status: Abnormal   Collection Time    12/13/12  5:00 AM      Result Value Range   WBC 8.9  4.0 - 10.5 K/uL   RBC 3.12 (*) 3.87 - 5.11 MIL/uL   Hemoglobin 9.6 (*) 12.0 - 15.0 g/dL   HCT 96.0 (*) 45.4 - 09.8 %   MCV 88.5  78.0 - 100.0 fL   MCH 30.8  26.0 - 34.0 pg   MCHC 34.8  30.0 - 36.0 g/dL   RDW 11.9 (*) 14.7 - 82.9 %   Platelets 277  150 - 400 K/uL  DIFFERENTIAL     Status: Abnormal   Collection Time    12/13/12  5:00 AM      Result Value Range   Neutrophils Relative % 73  43 - 77 %   Neutro Abs 6.5  1.7 - 7.7 K/uL   Lymphocytes Relative 14  12 - 46 %   Lymphs Abs 1.2  0.7 - 4.0 K/uL   Monocytes Relative 12  3 - 12 %   Monocytes Absolute 1.1 (*) 0.1 - 1.0 K/uL   Eosinophils Relative 1  0 - 5 %   Eosinophils Absolute 0.1  0.0 - 0.7 K/uL   Basophils Relative 0  0 - 1 %   Basophils Absolute 0.0  0.0 - 0.1 K/uL  TRIGLYCERIDES     Status: None   Collection Time    12/13/12  5:00 AM      Result Value Range    Triglycerides 101  <150 mg/dL  GLUCOSE, CAPILLARY     Status: Abnormal   Collection Time    12/13/12  5:51 AM      Result Value Range   Glucose-Capillary 128 (*) 70 - 99 mg/dL   Comment 1 Notify RN     Comment 2 Documented in Chart    GLUCOSE, CAPILLARY     Status: Abnormal   Collection Time    12/13/12 12:46 PM      Result Value Range   Glucose-Capillary 131 (*) 70 - 99 mg/dL  GLUCOSE, CAPILLARY     Status: Abnormal   Collection Time    12/13/12  5:27 PM      Result Value Range   Glucose-Capillary 130 (*) 70 - 99 mg/dL  GLUCOSE, CAPILLARY     Status: Abnormal   Collection Time    12/13/12 11:56 PM      Result Value Range   Glucose-Capillary 137 (*) 70 - 99 mg/dL   Comment 1 Notify RN     Comment 2 Documented in Chart    COMPREHENSIVE METABOLIC PANEL     Status: Abnormal   Collection Time    12/14/12  5:05 AM      Result Value Range   Sodium 129 (*) 135 - 145 mEq/L   Potassium 3.5  3.5 - 5.1 mEq/L   Chloride 97  96 - 112 mEq/L   CO2 23  19 - 32 mEq/L   Glucose, Bld 140 (*) 70 - 99 mg/dL   BUN 17  6 - 23 mg/dL   Creatinine, Ser 5.62 (*) 0.50 - 1.10 mg/dL  Calcium 8.8  8.4 - 10.5 mg/dL   Total Protein 5.8 (*) 6.0 - 8.3 g/dL   Albumin 2.8 (*) 3.5 - 5.2 g/dL   AST 41 (*) 0 - 37 U/L   ALT 36 (*) 0 - 35 U/L   Alkaline Phosphatase 79  39 - 117 U/L   Total Bilirubin 0.3  0.3 - 1.2 mg/dL   GFR calc non Af Amer >90  >90 mL/min   GFR calc Af Amer >90  >90 mL/min   Comment: (NOTE)     The eGFR has been calculated using the CKD EPI equation.     This calculation has not been validated in all clinical situations.     eGFR's persistently <90 mL/min signify possible Chronic Kidney     Disease.  MAGNESIUM     Status: None   Collection Time    12/14/12  5:05 AM      Result Value Range   Magnesium 2.0  1.5 - 2.5 mg/dL  PHOSPHORUS     Status: None   Collection Time    12/14/12  5:05 AM      Result Value Range   Phosphorus 2.6  2.3 - 4.6 mg/dL  GLUCOSE, CAPILLARY      Status: Abnormal   Collection Time    12/14/12  6:18 AM      Result Value Range   Glucose-Capillary 138 (*) 70 - 99 mg/dL    Studies/Results: Ct Abdomen Pelvis W Contrast  12/12/2012   CLINICAL DATA:  Recent surgery for ileus  EXAM: CT ABDOMEN AND PELVIS WITH CONTRAST  TECHNIQUE: Multidetector CT imaging of the abdomen and pelvis was performed using the standard protocol following bolus administration of intravenous contrast.  CONTRAST:  80mL OMNIPAQUE IOHEXOL 300 MG/ML  SOLN  COMPARISON:  Plain films 12/12/2012, CT 11/26/2012  FINDINGS: There is a small right effusion with associated passive atelectasis. No pericardial fluid.  NG tube extends to the stomach. Oral contrast was administered through the NG tube and fills the stomach. The proximal jejunum is normal caliber. Beginning in the proximal to mid ileum there dilated loops of small bowel with poor progression of the oral contrast. These bowel loops measure up to 3 cm. There is a fluid within the distal small bowel leading up to the terminal ileum / cecum which is mildly distended to 3 cm. There is a large volume of fluid in the ascending colon and cecum. There is gas distension of the transverse colon with fluid up to the surgical anastomosis in the left upper abdomen. Distal to this anastomosis, the bowel and is collapsed. Rectal contrast was administered. The contrast of flowed only to the level of the sigmoid colon.  There is no intraperitoneal free air. Small amount intraperitoneal free fluid is present. No focal hepatic lesion. No portal venous gas. Post cholecystectomy anatomy. The pancreas, spleen, adrenal glands, kidneys are normal.  No free fluid the pelvis. The bladder contains a small amount of gas presumably related to recent catheterization. The uterus demonstrates several calcified leiomyoma. No pelvic lymphadenopathy. Degenerate changes of the spine.  IMPRESSION: 1. Poor progression of the oral contrast through the small bowel. Loops  of small bowel are dilated and fluid-filled. The colon is also fluid-filled up to the anastomosis. Findings are suggestive of postoperative ileus versus obstruction at the anastomosis. Recommend serial plain films to evaluate progression of the oral contrast.  Findings conveyed toRamirezon 12/12/2012  at20:20.   Electronically Signed   By: Loura Halt.D.  On: 12/12/2012 20:37   Dg Abd 2 Views  12/12/2012   CLINICAL DATA:  Recent bowel resection.  Abdominal distention.  EXAM: ABDOMEN - 2 VIEW  COMPARISON:  12/09/2012.  FINDINGS: Surgical staples are noted over the abdomen and pelvis. NG tube noted projected over the stomach. Persistent distended loops of small and large bowel containing air-fluid levels are noted. Bowel distention has progressed slightly from prior study. Cecum measures approximately 10.5 cm in diameter. No free air is identified. Lung bases clear. No acute bony abnormality. Degenerative changes lumbar spine and right hip. Vascular calcification.  IMPRESSION: Findings most consistent with progressive adynamic ileus. Follow-up abdominal series suggested. NG tube noted projected over the stomach.   Electronically Signed   By: Maisie Fus  Register   On: 12/12/2012 15:44   Dg Abd Portable 2v  12/13/2012   CLINICAL DATA:  Colitis, followup contrast question ileus versus obstruction  EXAM: PORTABLE ABDOMEN - 2 VIEW  COMPARISON:  Radiographs and CT abdomen/pelvis of 12/12/2012  FINDINGS: Tip of nasogastric tube projects over stomach.  Skin clips project over mid abdomen.  Surgical clips right upper quadrant likely related to cholecystectomy.  Gaseous distention of cecum up to 11.4 cm diameter.  Gaseous dilatation of small bowel loops.  Findings may represent ileus or colonic obstruction.  No definite free intraperitoneal air or bowel wall thickening.  Bones appear severely demineralized with advanced osteoarthritic changes of the right hip joint.  Excreted contrast noted within urinary bladder.   IMPRESSION: Persistent dilatation of large and small bowel loops question ileus versus obstruction.  Cecum now measures 11.4 cm diameter, increased from the 9.9 cm on the previous CT.  Findings called to patient's nurse Raoul Pitch on [address] on 12/13/2012 at 1407 hr.   Electronically Signed   By: Ulyses Southward M.D.   On: 12/13/2012 14:07   Medications: Scheduled Meds: . acetaminophen  650 mg Rectal Q6H   Or  . acetaminophen  650 mg Oral Q6H  . enoxaparin (LOVENOX) injection  40 mg Subcutaneous Q24H  . insulin aspart  0-9 Units Subcutaneous Q6H  . pantoprazole (PROTONIX) IV  40 mg Intravenous Q12H   Continuous Infusions: . dextrose 5 % and 0.9 % NaCl with KCl 40 mEq/L 35 mL/hr (12/13/12 1743)  . Marland KitchenTPN (CLINIMIX-E) Adult 80 mL/hr at 12/13/12 1739   And  . fat emulsion 240 mL (12/13/12 1739)   PRN Meds:.fentaNYL, hydrALAZINE, menthol-cetylpyridinium, methocarbamol (ROBAXIN) IV, midazolam, morphine injection, phenol, promethazine, sodium chloride  Assessment/Plan:   Principal Problem:  Abdominal pain - persistent and severe. CT scan with possible obstruction at the anastomotic site.  She has had a loose bowel movement that was black today.  This may be simply bleeding from her sigmoid colon and certainly does not signify definite improvement in bowel function.  Continue IV Protonix - ileus persisting - laboratories are okay except for mild hyponatremia and can only hope that she does not have gangrenous bowel at this time. Situation very tenuous.  Pain control is important and will increase dose of morphine  Active Problems:  Pacemaker implanted 08/28/12 - functioning normally currently  Essential hypertension, benign - blood pressure mildly elevated systolically, likely secondary to pain  Colonic mass - diagnostic of adenocarcinoma  Continue incentive spirometry  Physical therapy as soon as she is able  FEN - continue TPN. Continue NG tube.  Disposition: Patient is critically ill and LTAC is not  appropriate at all for this patient!!  Patient requesting to be DO NOT RESUSCITATE but we'll  continue current therapies     LOS: 9 days   Tamara Dubonnet, MD 12/14/2012, 7:16 AM

## 2012-12-14 NOTE — Progress Notes (Addendum)
PARENTERAL NUTRITION CONSULT NOTE - Follow Up  Pharmacy Consult:  TPN Indication: Prolonged post-op ileus  Allergies  Allergen Reactions  . Codeine Nausea And Vomiting  . Zofran [Ondansetron Hcl]    Patient Measurements: Height: 5\' 5"  (165.1 cm) Weight: 142 lb 8 oz (64.638 kg) IBW/kg (Calculated) : 57  Vital Signs: Temp: 97.5 F (36.4 C) (11/20 0516) Temp src: Oral (11/20 0516) BP: 137/70 mmHg (11/20 0516) Pulse Rate: 102 (11/20 0516)  Labs:  Recent Labs  12/11/12 1700 12/12/12 0510 12/13/12 0500  WBC  --  10.2 8.9  HGB 9.9* 9.5* 9.6*  HCT 27.7* 28.2* 27.6*  PLT  --  302 277    Recent Labs  12/12/12 0510 12/13/12 0500 12/14/12 0505  NA 133* 132* 129*  K 3.9 3.5 3.5  CL 100 98 97  CO2 26 25 23   GLUCOSE 116* 131* 140*  BUN 14 13 17   CREATININE 0.50 0.47* 0.44*  CALCIUM 9.4 9.2 8.8  MG 2.0 2.0 2.0  PHOS 2.6 2.4 2.6  PROT  --  6.0 5.8*  ALBUMIN  --  2.9* 2.8*  AST  --  63* 41*  ALT  --  40* 36*  ALKPHOS  --  80 79  BILITOT  --  0.4 0.3  PREALBUMIN  --  17.5*  --   TRIG  --  101  --    Estimated Creatinine Clearance: 49.6 ml/min (by C-G formula based on Cr of 0.44).   Recent Labs  12/13/12 1727 12/13/12 2356 12/14/12 0618  GLUCAP 130* 137* 138*     Insulin Requirements in the past 12 hours:  3 units SSI  Assessment:  61 YOF s/p lap-assisted transverse colectomy on 12/07/12 for partially obstructin transverse colon mass. She continues on TPN for post-op ileus.  GI: intermittent nausea and severe abd pain/discomfort.  NG O/P relatively unchanged (450 mL) but negative for brown/bloody aspirate.  TG WNL, prealbumin low end of normal (17.5), LBM 11/20 - PPI IV.  May undergo decompression for cecal dilatation. Endo: no hx DM - CBGs well controlled with minimal SSI use Lytes: hyponatremia, others WNL Renal: SCr 0.44 (stable), CrCL 50 ml/min, I/O's inaccurate, D5NS with KCL at 35 ml/hr Pulm: stable on RA Cards: hx HTN - BP elevated, HR normal  (lisinopril PTA) Hepatobil: LFTs WNL at baseline - has trended up slightly Heme/Onc: hx breast cancer - hgb 9.2, plts WNL (stable), FOB+ on 11/10 and 11/17 Neuro: A&O - scheduled APAP, not optimistic about her condition improving ID: afebrile, WBC WNL, C.diff PCR negative - not on abx Best Practices: Lovenox, SCDs TPN Access: PICC placed 12/11/12 TPN day#: 3 (11/18 >> )  Current Nutrition:  Clinimix E 5/15 at 80 ml/hr + IVFE at 10 ml/hr  Estimated Nutritional Needs:  1700 - 1950 kCal; 85-95 g protein daily  Plan:  - Continue Clinimix E 5/15 at 80 ml/hr + IVFE at 10 ml/hr, providing 1843 kCal and 96 gm protein daily, meeting 100% of needs - Daily multivitamin and trace elements - Continue SSI, d/c in AM if CBGs remain controlled - Remove D5W from IVF due to hyponatremia - F/U home lisinopril when able, consider using IV Lopressor in the meantime - Consider repeat FOBT     Jaydin Jalomo D. Laney Potash, PharmD, BCPS Pager:  [phone] 12/15/2012, 9:11 AM

## 2012-12-14 NOTE — Progress Notes (Signed)
Patient ID: Tamara [name], female   DOB: [birth year], 79 y.o.   MRN: [id number]  Social visit to check on Tamara [name], who is trying to overcome a postop ileus following her colon cancer surgery.  She reports having a loose stool this morning for the first time and feels a little better about that. Pain not as bad today. She feels a little more optimistic that the ileus will resolve. Husband at bedside.  On TNA for nutrition.   Appreciate excellent care by the surgeons and Dr. Kevan Ny.

## 2012-12-14 NOTE — Progress Notes (Signed)
Went to give patient pain meds- pt asleep. Will continue to monitor

## 2012-12-14 NOTE — Progress Notes (Signed)
Agree with above 

## 2012-12-15 ENCOUNTER — Encounter (HOSPITAL_COMMUNITY): Payer: Self-pay | Admitting: Gastroenterology

## 2012-12-15 ENCOUNTER — Encounter (HOSPITAL_COMMUNITY)
Admission: EM | Disposition: A | Discharge status: Skilled Nursing Facility | Payer: Self-pay | Source: Home / Self Care | Attending: Internal Medicine

## 2012-12-15 HISTORY — PX: BOWEL DECOMPRESSION: SHX5532

## 2012-12-15 HISTORY — PX: COLONOSCOPY: SHX5424

## 2012-12-15 LAB — CBC WITH DIFFERENTIAL/PLATELET
Basophils Absolute: 0 10*3/uL (ref 0.0–0.1)
Eosinophils Absolute: 0.1 10*3/uL (ref 0.0–0.7)
Eosinophils Relative: 2 % (ref 0–5)
HCT: 26.1 % — ABNORMAL LOW (ref 36.0–46.0)
Hemoglobin: 9.2 g/dL — ABNORMAL LOW (ref 12.0–15.0)
Lymphocytes Relative: 15 % (ref 12–46)
MCH: 31.2 pg (ref 26.0–34.0)
MCHC: 35.2 g/dL (ref 30.0–36.0)
MCV: 88.5 fL (ref 78.0–100.0)
Monocytes Absolute: 1 10*3/uL (ref 0.1–1.0)
Platelets: 267 10*3/uL (ref 150–400)
RDW: 16.9 % — ABNORMAL HIGH (ref 11.5–15.5)
WBC: 7.4 10*3/uL (ref 4.0–10.5)

## 2012-12-15 LAB — GLUCOSE, CAPILLARY
Glucose-Capillary: 119 mg/dL — ABNORMAL HIGH (ref 70–99)
Glucose-Capillary: 129 mg/dL — ABNORMAL HIGH (ref 70–99)

## 2012-12-15 LAB — BASIC METABOLIC PANEL
CO2: 25 mEq/L (ref 19–32)
Calcium: 8.9 mg/dL (ref 8.4–10.5)
Chloride: 98 mEq/L (ref 96–112)
Creatinine, Ser: 0.44 mg/dL — ABNORMAL LOW (ref 0.50–1.10)
GFR calc Af Amer: >90 mL/min (ref >90)
GFR calc non Af Amer: >90 mL/min (ref >90)
Sodium: 130 mEq/L — ABNORMAL LOW (ref 135–145)

## 2012-12-15 SURGERY — COLONOSCOPY
Anesthesia: Moderate Sedation

## 2012-12-15 MED ORDER — FENTANYL CITRATE 0.05 MG/ML IJ SOLN
50.0000 ug | INTRAMUSCULAR | Status: DC | PRN
  Administered 2012-12-16 – 2012-12-17 (×5): 50 ug via INTRAVENOUS
  Filled 2012-12-15 (×5): qty 2

## 2012-12-15 MED ORDER — MIDAZOLAM HCL 10 MG/2ML IJ SOLN
INTRAMUSCULAR | Status: DC | PRN
  Administered 2012-12-15: 1 mg via INTRAVENOUS
  Administered 2012-12-15: 2 mg via INTRAVENOUS
  Administered 2012-12-15 (×3): 1 mg via INTRAVENOUS

## 2012-12-15 MED ORDER — FENTANYL CITRATE 0.05 MG/ML IJ SOLN: 50.0000 ug | INTRAMUSCULAR | Status: DC

## 2012-12-15 MED ORDER — FENTANYL CITRATE 0.05 MG/ML IJ SOLN
INTRAMUSCULAR | Status: AC
  Filled 2012-12-15: qty 2

## 2012-12-15 MED ORDER — SODIUM CHLORIDE 0.9 % IV SOLN: INTRAVENOUS | Status: DC

## 2012-12-15 MED ORDER — TRACE MINERALS CR-CU-F-FE-I-MN-MO-SE-ZN IV SOLN
INTRAVENOUS | Status: AC
  Administered 2012-12-15: 17:00:00 via INTRAVENOUS
  Filled 2012-12-15: qty 2000

## 2012-12-15 MED ORDER — ENOXAPARIN SODIUM 30 MG/0.3ML ~~LOC~~ SOLN
30.0000 mg | SUBCUTANEOUS | Status: DC
  Administered 2012-12-15 – 2012-12-21 (×7): 30 mg via SUBCUTANEOUS
  Filled 2012-12-15 (×8): qty 0.3

## 2012-12-15 MED ORDER — POTASSIUM CHLORIDE IN NACL 40-0.9 MEQ/L-% IV SOLN
INTRAVENOUS | Status: DC
  Administered 2012-12-15: 35 mL/h via INTRAVENOUS
  Administered 2012-12-17 – 2012-12-24 (×6): via INTRAVENOUS
  Filled 2012-12-15 (×10): qty 1000

## 2012-12-15 MED ORDER — DIPHENHYDRAMINE HCL 50 MG/ML IJ SOLN
INTRAMUSCULAR | Status: AC
  Filled 2012-12-15: qty 1

## 2012-12-15 MED ORDER — FAT EMULSION 20 % IV EMUL
250.0000 mL | INTRAVENOUS | Status: AC
  Administered 2012-12-15: 250 mL via INTRAVENOUS
  Filled 2012-12-15: qty 250

## 2012-12-15 MED ORDER — FENTANYL CITRATE 0.05 MG/ML IJ SOLN
INTRAMUSCULAR | Status: DC | PRN
  Administered 2012-12-15 (×2): 25 ug via INTRAVENOUS

## 2012-12-15 MED ORDER — METHOCARBAMOL 100 MG/ML IJ SOLN
1000.0000 mg | Freq: Three times a day (TID) | INTRAVENOUS | Status: DC | PRN
  Administered 2012-12-15 – 2012-12-19 (×4): 1500 mg via INTRAVENOUS
  Filled 2012-12-15 (×6): qty 15

## 2012-12-15 MED ORDER — MIDAZOLAM HCL 5 MG/ML IJ SOLN
INTRAMUSCULAR | Status: AC
  Filled 2012-12-15: qty 2

## 2012-12-15 NOTE — Progress Notes (Signed)
8 Days Post-Op  Subjective: Pt had a bad afternoon yesterday after the xray tests.  She really doesn't want anymore tests and does not want surgery.  Pt is in poor spirits.  She's not very optimistic about her condition improving.  She has intermittent nausea and intermittent servere abdominal pain/discomfort.  No vomiting, no flatus or BM since 11/20 (small bm).  Ambulated minimally yesterday and PT was cancelled.  Objective: Vital signs in last 24 hours: Temp:  [97.7 F (36.5 C)-98.5 F (36.9 C)] 97.7 F (36.5 C) (11/21 0745) Pulse Rate:  [88-97] 96 (11/21 0745) Resp:  [17-20] 17 (11/21 0745) BP: (156-177)/(74-84) 167/74 mmHg (11/21 0745) SpO2:  [98 %-99 %] 98 % (11/21 0605) Weight:  [142 lb 8 oz (64.638 kg)] 142 lb 8 oz (64.638 kg) (11/20 0947) Last BM Date: 12/14/12  Intake/Output from previous day: 11/20 0701 - 11/21 0700 In: 5716.3 [P.O.:100; I.V.:2123.9; TPN:3492.3] Out: 575 [Emesis/NG output:575] Intake/Output this shift:    PE: Gen:  Alert, NAD, pleasant Abd: Soft, distended, moderate tenderness, +BS, no HSM, incisions C/D/I with staples in place  Lab Results:   Recent Labs  12/14/12 1122 12/15/12 0500  WBC 9.0 7.4  HGB 9.8* 9.2*  HCT 27.8* 26.1*  PLT 271 267   BMET  Recent Labs  12/14/12 0505 12/15/12 0500  NA 129* 130*  K 3.5 4.1  CL 97 98  CO2 23 25  GLUCOSE 140* 130*  BUN 17 22  CREATININE 0.44* 0.44*  CALCIUM 8.8 8.9   PT/INR No results found for this basename: LABPROT, INR,  in the last 72 hours CMP     Component Value Date/Time   NA 130* 12/15/2012 0500   K 4.1 12/15/2012 0500   CL 98 12/15/2012 0500   CO2 25 12/15/2012 0500   GLUCOSE 130* 12/15/2012 0500   BUN 22 12/15/2012 0500   CREATININE 0.44* 12/15/2012 0500   CALCIUM 8.9 12/15/2012 0500   PROT 5.8* 12/14/2012 0505   ALBUMIN 2.8* 12/14/2012 0505   AST 41* 12/14/2012 0505   ALT 36* 12/14/2012 0505   ALKPHOS 79 12/14/2012 0505   BILITOT 0.3 12/14/2012 0505   GFRNONAA >90  12/15/2012 0500   GFRAA >90 12/15/2012 0500   Lipase     Component Value Date/Time   LIPASE 31 11/26/2012 1352       Studies/Results: Dg Abd Portable 2v  12/13/2012   CLINICAL DATA:  Colitis, followup contrast question ileus versus obstruction  EXAM: PORTABLE ABDOMEN - 2 VIEW  COMPARISON:  Radiographs and CT abdomen/pelvis of 12/12/2012  FINDINGS: Tip of nasogastric tube projects over stomach.  Skin clips project over mid abdomen.  Surgical clips right upper quadrant likely related to cholecystectomy.  Gaseous distention of cecum up to 11.4 cm diameter.  Gaseous dilatation of small bowel loops.  Findings may represent ileus or colonic obstruction.  No definite free intraperitoneal air or bowel wall thickening.  Bones appear severely demineralized with advanced osteoarthritic changes of the right hip joint.  Excreted contrast noted within urinary bladder.  IMPRESSION: Persistent dilatation of large and small bowel loops question ileus versus obstruction.  Cecum now measures 11.4 cm diameter, increased from the 9.9 cm on the previous CT.  Findings called to patient's nurse Raoul Pitch on [address] on 12/13/2012 at 1407 hr.   Electronically Signed   By: Ulyses Southward M.D.   On: 12/13/2012 14:07   Dg Colon W/water Sol Cm  12/14/2012   CLINICAL DATA:  Post-op laparoscopic assisted transverse colectomy 12/07/2012.  Continued abdominal pain and distension.  EXAM: COLON WITH WATER SOLUTION CONTRAST  TECHNIQUE: Initial scout AP supine abdominal image obtained to insure adequate colon cleansing. Omnipaque was introduced into the colon in a retrograde fashion and refluxed from the rectum to the cecum. Spot images of the colon followed by overhead radiographs were obtained.  COMPARISON:  One view abdomen 11/1912. Abdominal pelvic CT 12/12/2012.  FLUOROSCOPY TIME:  1 min and 3 seconds.  FINDINGS: The scout abdominal radiograph demonstrates a nasogastric tube is in the proximal stomach. There is persistent diffuse small  bowel and proximal colonic distention. The cecum measures approximately 13.7 cm, increased from yesterday. Abdominal skin staples and right hip osteoarthritis are noted.  The contrast filled the rectum and passed through the sigmoid colon which demonstrates extensive diverticulosis. The contrast rapidly crossed the transverse/sigmoid colon anastomosis with subsequent filling of the distended right and proximal transverse colon. No extravasation was identified. The anastomosis was difficult to evaluate given the patient's limited mobility and does appear somewhat narrowed. Overhead radiographs demonstrate some opacification of dilated distal small bowel.  IMPRESSION: 1. The transverse/sigmoid colon anastomosis is irregular, but patent. There was no obstruction to retrograde filling of the proximal colon. No extravasation identified. 2. The sigmoid colon demonstrates extensive diverticulosis. 3. Progressive distension of the proximal colon and distal small bowel.   Electronically Signed   By: Roxy Horseman M.D.   On: 12/14/2012 14:36    Anti-infectives: Anti-infectives   Start     Dose/Rate Route Frequency Ordered Stop   12/07/12 0600  cefoTEtan (CEFOTAN) 2 g in dextrose 5 % 50 mL IVPB     2 g 100 mL/hr over 30 Minutes Intravenous On call to O.R. 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 Not significantly improved today, pain/distension seems to be the major problem and is preventing her from ambulating 2. Continue IV pain meds, inc. robaxin, tylenol, and ice pack, may need to try dilaudid instead 3. Continue NPO until return of bowel  function prior to clamping trials and advancing diet, continue TNA  4. Colon contrast film yesterday shows patent anastomosis and bowel patterns consistent with ileus, cecum dilitation 5. May ask GI to give [place] their opinion about potentially doing a decompression given her inadequate improvement and cecal dilitation 6. DVT proph: ambulation, SCD's, lovenox  7. PT is a must because she's not ambulating enough 8. The patient does not want surgery.  She may not be agreeable to a procedure, but if sedated may not mind it.   9. Staples can likely come out in 2-5 days    LOS: 10 days    DORT, Dylyn Mclaren 12/15/2012, 8:17 AM Pager: [phone]

## 2012-12-15 NOTE — Progress Notes (Signed)
Tamara [name] 1:01 PM  Subjective: Patient seen and examined and case discussed with surgical team as well as patient and her husband she has been burping and walking in the halls some and passing minimal air from below and no new complaints  Objective: Vital signs stable afebrile no acute distress abdomen has rare bowel sounds distended but soft nontender white count okay potassium okay x-ray CT and Gastrografin enema reviewed  Assessment: Postop ileus with increased cecal dilation  Plan: The risk of decompressive colonoscopy was discussed with the patient and her husband and we'll proceed later today with further workup and plans pending those findings  [facility] E

## 2012-12-15 NOTE — Op Note (Signed)
[name] [address] [place], [address]   COLONOSCOPY PROCEDURE REPORT  PATIENT: Tamara [name], Tamara [name]  MR#: [id number] BIRTHDATE: 04/30/1929 , 83  yrs. old GENDER: Female ENDOSCOPIST: Vida Rigger, MD REFERRED XB:[id number] Dwain Sarna, M.D. PROCEDURE DATE:  12/15/2012 PROCEDURE:   Colonoscopy, diagnostic ASA CLASS:   Class III INDICATIONS:an abnormal x-ray. MEDICATIONS: Fentanyl 50 mcg IV and Versed 6 mg IV  DESCRIPTION OF PROCEDURE:   After the risks benefits and alternatives of the procedure were thoroughly explained, informed consent was obtained.  The Pentax Ped Colon [id number]  endoscope was introduced through the anus and advanced to the terminal ileum which was intubated for a short distance , limited by No adverse events experienced.   The quality of the prep was adequate. .  The instrument was then slowly withdrawn as the colon was fully examined.on insertion some left-sided diverticuli were seen and then her anastomosis was quite edematous and inflamed and we were carefully able to advance past it and then suctioned over 1 L of fluid from the colon and additional fluid from the terminal ileum and there was no signs of cecal ischemia and no other obvious findings and the patient tolerated the procedure well there was no obvious immediate complication       FINDINGS: #1 small external hemorrhoids 2 left-sided diverticula 3 descending edematous inflamed anastomosis 4 normal right side of the colon status post over 1 L of fluid suctioned 5. Otherwise within normal limits to the terminal ileum COMPLICATIONS:none  IMPRESSION:  above  RECOMMENDATIONS: continue present management try to decrease pain medicine increase activity low-dose MiraLax and will allow sips of clear liquids   _______________________________ eSigned:  Vida Rigger, MD 12/15/2012 3:20 PM   AO:[id number] Dwain Sarna, MD

## 2012-12-15 NOTE — Clinical Social Work Placement (Deleted)
Clinical Social Work Department CLINICAL SOCIAL WORK PLACEMENT NOTE 12/15/2012  Patient:  Jovita Kussmaul  Account Number:  [id number] Admit date:  12/13/2012  Clinical Social Worker:  Cherre Blanc, [place]  Date/time:  12/14/2012 01:00 PM  Clinical Social Work is seeking post-discharge placement for this patient at the following level of care:   SKILLED NURSING   (*CSW will update this form in Epic as items are completed)   12/14/2012  Patient/family provided with [facility] Department of Clinical Social Work's list of facilities offering this level of care within the geographic area requested by the patient (or if unable, by the patient's family).  12/14/2012  Patient/family informed of their freedom to choose among providers that offer the needed level of care, that participate in Medicare, Medicaid or managed care program needed by the patient, have an available bed and are willing to accept the patient.  12/14/2012  Patient/family informed of [facility]' ownership interest in [facility], as well as of the fact that they are under no obligation to receive care at this facility.  PASARR submitted to EDS on  PASARR number received from EDS on   FL2 transmitted to all facilities in geographic area requested by pt/family on  12/14/2012 FL2 transmitted to all facilities within larger geographic area on   Patient informed that his/her managed care company has contracts with or will negotiate with  certain facilities, including the following:     Patient/family informed of bed offers received:  12/15/2012 Patient chooses bed at  Physician recommends and patient chooses bed at    Patient to be transferred to  on   Patient to be transferred to facility by   The following physician request were entered in Epic:   Additional Comments:   Roddie Mc, Bryon Lions, [phone]

## 2012-12-15 NOTE — Progress Notes (Signed)
NUTRITION FOLLOW-UP  DOCUMENTATION CODES Per approved criteria  -Not Applicable   INTERVENTION: TPN per PharmD. RD to continue to follow nutrition care plan.  NUTRITION DIAGNOSIS: Inadequate oral intake related to altered GI function as evidenced by very limited PO intake.   Goal: Intake to meet >90% of estimated nutrition needs. Met.  Monitor:  weight trends, lab trends, I/O's, TPN tolerance and transition to diet  ASSESSMENT: PMHx significant for breast CA and colitis. Pt was recently admitted for colitis, and presented to ER 2/2 rectal bleeding. She states that since her last discharge she has been having dark, liquidy stools.   Underwent flex sig on 11/12 - findings revealed fungating ulcerated nonobstructing mass (suspected malignancy) in transverse colon -s/p biopsies and tattooing and internal hemorrhoids.  Underwent partial colectomy on 11/13. NGT placed 11/16 pt with 575 ml of output yesterday. Pt has not been able to advance beyond clear liquids during this admission. Patient is receiving TPN with Clinimix E 5/15 @ 80 ml/hr and lipids @ 10 ml/hr. Provides 1843 kcal, and 96 grams protein per day. Meets 100% minimum estimated energy needs and 100% minimum estimated protein needs.  Pt in poor spirits today - doesn't want anymore tests or surgery, per most recent surgery note. Having intermittent nausea and severe abdominal pain/discomfort.  Sodium is low at 130, but trending up. Potassium, magnesium and phosphorus are all WNL. CBG's ranging from 122 - 132. Trig WNL. Prealbumin 17.5.  Height: Ht Readings from Last 1 Encounters:  12/05/12 5\' 5"  (1.651 m)    Weight: Wt Readings from Last 1 Encounters:  12/14/12 142 lb 8 oz (64.638 kg)   BMI:  Body mass index is 23.71 kg/(m^2). WNL  Estimated Nutritional Needs: Kcal: 1700 - 1950 Protein: at least 87 g protein daily Fluid: at least 1.7 liters daily  Skin: abdomen incision  Diet Order: NPO  EDUCATION NEEDS: -No  education needs identified at this time   Intake/Output Summary (Last 24 hours) at 12/15/12 1038 Last data filed at 12/15/12 0700  Gross per 24 hour  Intake 5616.26 ml  Output    575 ml  Net 5041.26 ml    Last BM: 11/20  Labs:   Recent Labs Lab 12/12/12 0510 12/13/12 0500 12/14/12 0505 12/15/12 0500  NA 133* 132* 129* 130*  K 3.9 3.5 3.5 4.1  CL 100 98 97 98  CO2 26 25 23 25   BUN 14 13 17 22   CREATININE 0.50 0.47* 0.44* 0.44*  CALCIUM 9.4 9.2 8.8 8.9  MG 2.0 2.0 2.0  --   PHOS 2.6 2.4 2.6  --   GLUCOSE 116* 131* 140* 130*    CBG (last 3)   Recent Labs  12/14/12 1735 12/15/12 0007 12/15/12 0614  GLUCAP 132* 129* 122*   Triglycerides  Date/Time Value Range Status  12/13/2012  5:00 AM 101  <150 mg/dL Final   Prealbumin  Date/Time Value Range Status  12/13/2012  5:00 AM 17.5* 17.0 - 34.0 mg/dL Final     Performed at [facility]     Scheduled Meds: . acetaminophen  650 mg Rectal Q6H   Or  . acetaminophen  650 mg Oral Q6H  . enoxaparin (LOVENOX) injection  30 mg Subcutaneous Q24H  . insulin aspart  0-9 Units Subcutaneous Q6H  . pantoprazole (PROTONIX) IV  40 mg Intravenous Q12H    Continuous Infusions: . 0.9 % NaCl with KCl 40 mEq / L 35 mL/hr (12/15/12 0959)  . Marland KitchenTPN (CLINIMIX-E) Adult 80 mL/hr at  12/14/12 1734   And  . fat emulsion 240 mL (12/14/12 1734)  . Marland KitchenTPN (CLINIMIX-E) Adult     And  . fat emulsion      Jarold Motto MS, RD, LDN Pager: [phone] After-hours pager: [phone]

## 2012-12-15 NOTE — Progress Notes (Signed)
Her exam is unchanged. Although her anastomosis was patent on gg enema I am still concerned she is not better and that her right colon is dilate up to that point. I have discussed case with Dr Ewing Schlein to do endoscopy today to further evaluate this and to see if this could be decompressed.  I am somewhat concerned she may need to go back to or but think conservative therapy still merited.  She does not want to do any more procedures and told me today she would rather pass.  I have discussed endoscopy with her today and she agrees to this.

## 2012-12-15 NOTE — Progress Notes (Signed)
Subjective: Tamara [name] again is having continued problems with her abdomen does seem less distended today. Anastomosis was patent yesterday with colonic imaging study but she still has a lot of distention in the cecal area. Plans are as per surgery. Becoming hard to convince patient to continue on.... She is a no CODE BLUE but systemically seems to be doing fine overall but certainly needs to reduce pain medicines when possible and to treat the severe colonic ileus  Objective: Weight change:   Intake/Output Summary (Last 24 hours) at 12/15/12 1302 Last data filed at 12/15/12 0700  Gross per 24 hour  Intake 5616.26 ml  Output    575 ml  Net 5041.26 ml   Filed Vitals:   12/14/12 1946 12/15/12 0605 12/15/12 0745 12/15/12 1209  BP: 156/78 177/81 167/74 149/77  Pulse: 88 91 96 93  Temp: 98.3 F (36.8 C) 97.7 F (36.5 C) 97.7 F (36.5 C)   TempSrc: Oral Oral Oral   Resp: 18 18 17 18   Height:      Weight:      SpO2: 99% 98%  97%    General Appearance: Eyes closed, conversive, feels very weak, miserable  NG aspirate: Bile-colored, clear  Lungs: Clear to auscultation bilaterally, respirations unlabored  Heart: Regular rate and rhythm, S1 and S2 normal, no murmur, rub or gallop  Abdomen: Soft with tenderness diffusely. Bowel sounds are present this morning!!  Extremities: Extremities normal, atraumatic, no cyanosis or edema  Neuro: Nonfocal, lethargic   Lab Results: Results for orders placed during the hospital encounter of 12/05/12 (from the past 48 hour(s))  GLUCOSE, CAPILLARY     Status: Abnormal   Collection Time    12/13/12  5:27 PM      Result Value Range   Glucose-Capillary 130 (*) 70 - 99 mg/dL  GLUCOSE, CAPILLARY     Status: Abnormal   Collection Time    12/13/12 11:56 PM      Result Value Range   Glucose-Capillary 137 (*) 70 - 99 mg/dL   Comment 1 Notify RN     Comment 2 Documented in Chart    COMPREHENSIVE METABOLIC PANEL     Status: Abnormal   Collection Time     12/14/12  5:05 AM      Result Value Range   Sodium 129 (*) 135 - 145 mEq/L   Potassium 3.5  3.5 - 5.1 mEq/L   Chloride 97  96 - 112 mEq/L   CO2 23  19 - 32 mEq/L   Glucose, Bld 140 (*) 70 - 99 mg/dL   BUN 17  6 - 23 mg/dL   Creatinine, Ser 1.61 (*) 0.50 - 1.10 mg/dL   Calcium 8.8  8.4 - 09.6 mg/dL   Total Protein 5.8 (*) 6.0 - 8.3 g/dL   Albumin 2.8 (*) 3.5 - 5.2 g/dL   AST 41 (*) 0 - 37 U/L   ALT 36 (*) 0 - 35 U/L   Alkaline Phosphatase 79  39 - 117 U/L   Total Bilirubin 0.3  0.3 - 1.2 mg/dL   GFR calc non Af Amer >90  >90 mL/min   GFR calc Af Amer >90  >90 mL/min   Comment: (NOTE)     The eGFR has been calculated using the CKD EPI equation.     This calculation has not been validated in all clinical situations.     eGFR's persistently <90 mL/min signify possible Chronic Kidney     Disease.  MAGNESIUM  Status: None   Collection Time    12/14/12  5:05 AM      Result Value Range   Magnesium 2.0  1.5 - 2.5 mg/dL  PHOSPHORUS     Status: None   Collection Time    12/14/12  5:05 AM      Result Value Range   Phosphorus 2.6  2.3 - 4.6 mg/dL  GLUCOSE, CAPILLARY     Status: Abnormal   Collection Time    12/14/12  6:18 AM      Result Value Range   Glucose-Capillary 138 (*) 70 - 99 mg/dL  CBC WITH DIFFERENTIAL     Status: Abnormal   Collection Time    12/14/12 11:22 AM      Result Value Range   WBC 9.0  4.0 - 10.5 K/uL   RBC 3.17 (*) 3.87 - 5.11 MIL/uL   Hemoglobin 9.8 (*) 12.0 - 15.0 g/dL   HCT 11.9 (*) 14.7 - 82.9 %   MCV 87.7  78.0 - 100.0 fL   MCH 30.9  26.0 - 34.0 pg   MCHC 35.3  30.0 - 36.0 g/dL   RDW 56.2 (*) 13.0 - 86.5 %   Platelets 271  150 - 400 K/uL   Neutrophils Relative % 74  43 - 77 %   Neutro Abs 6.7  1.7 - 7.7 K/uL   Lymphocytes Relative 14  12 - 46 %   Lymphs Abs 1.2  0.7 - 4.0 K/uL   Monocytes Relative 11  3 - 12 %   Monocytes Absolute 1.0  0.1 - 1.0 K/uL   Eosinophils Relative 1  0 - 5 %   Eosinophils Absolute 0.1  0.0 - 0.7 K/uL   Basophils  Relative 0  0 - 1 %   Basophils Absolute 0.0  0.0 - 0.1 K/uL  GLUCOSE, CAPILLARY     Status: Abnormal   Collection Time    12/14/12 12:11 PM      Result Value Range   Glucose-Capillary 132 (*) 70 - 99 mg/dL  GLUCOSE, CAPILLARY     Status: Abnormal   Collection Time    12/14/12  5:35 PM      Result Value Range   Glucose-Capillary 132 (*) 70 - 99 mg/dL  GLUCOSE, CAPILLARY     Status: Abnormal   Collection Time    12/15/12 12:07 AM      Result Value Range   Glucose-Capillary 129 (*) 70 - 99 mg/dL   Comment 1 Notify RN     Comment 2 Documented in Chart    CBC WITH DIFFERENTIAL     Status: Abnormal   Collection Time    12/15/12  5:00 AM      Result Value Range   WBC 7.4  4.0 - 10.5 K/uL   RBC 2.95 (*) 3.87 - 5.11 MIL/uL   Hemoglobin 9.2 (*) 12.0 - 15.0 g/dL   HCT 78.4 (*) 69.6 - 29.5 %   MCV 88.5  78.0 - 100.0 fL   MCH 31.2  26.0 - 34.0 pg   MCHC 35.2  30.0 - 36.0 g/dL   RDW 28.4 (*) 13.2 - 44.0 %   Platelets 267  150 - 400 K/uL   Neutrophils Relative % 69  43 - 77 %   Neutro Abs 5.1  1.7 - 7.7 K/uL   Lymphocytes Relative 15  12 - 46 %   Lymphs Abs 1.1  0.7 - 4.0 K/uL   Monocytes Relative 14 (*) 3 - 12 %  Monocytes Absolute 1.0  0.1 - 1.0 K/uL   Eosinophils Relative 2  0 - 5 %   Eosinophils Absolute 0.1  0.0 - 0.7 K/uL   Basophils Relative 0  0 - 1 %   Basophils Absolute 0.0  0.0 - 0.1 K/uL  BASIC METABOLIC PANEL     Status: Abnormal   Collection Time    12/15/12  5:00 AM      Result Value Range   Sodium 130 (*) 135 - 145 mEq/L   Potassium 4.1  3.5 - 5.1 mEq/L   Chloride 98  96 - 112 mEq/L   CO2 25  19 - 32 mEq/L   Glucose, Bld 130 (*) 70 - 99 mg/dL   BUN 22  6 - 23 mg/dL   Creatinine, Ser 1.91 (*) 0.50 - 1.10 mg/dL   Calcium 8.9  8.4 - 47.8 mg/dL   GFR calc non Af Amer >90  >90 mL/min   GFR calc Af Amer >90  >90 mL/min   Comment: (NOTE)     The eGFR has been calculated using the CKD EPI equation.     This calculation has not been validated in all clinical  situations.     eGFR's persistently <90 mL/min signify possible Chronic Kidney     Disease.  GLUCOSE, CAPILLARY     Status: Abnormal   Collection Time    12/15/12  6:14 AM      Result Value Range   Glucose-Capillary 122 (*) 70 - 99 mg/dL   Comment 1 Notify RN     Comment 2 Documented in Chart    GLUCOSE, CAPILLARY     Status: Abnormal   Collection Time    12/15/12 12:08 PM      Result Value Range   Glucose-Capillary 139 (*) 70 - 99 mg/dL    Studies/Results: Dg Colon W/water Sol Cm  12/14/2012   CLINICAL DATA:  Post-op laparoscopic assisted transverse colectomy 12/07/2012. Continued abdominal pain and distension.  EXAM: COLON WITH WATER SOLUTION CONTRAST  TECHNIQUE: Initial scout AP supine abdominal image obtained to insure adequate colon cleansing. Omnipaque was introduced into the colon in a retrograde fashion and refluxed from the rectum to the cecum. Spot images of the colon followed by overhead radiographs were obtained.  COMPARISON:  One view abdomen 11/1912. Abdominal pelvic CT 12/12/2012.  FLUOROSCOPY TIME:  1 min and 3 seconds.  FINDINGS: The scout abdominal radiograph demonstrates a nasogastric tube is in the proximal stomach. There is persistent diffuse small bowel and proximal colonic distention. The cecum measures approximately 13.7 cm, increased from yesterday. Abdominal skin staples and right hip osteoarthritis are noted.  The contrast filled the rectum and passed through the sigmoid colon which demonstrates extensive diverticulosis. The contrast rapidly crossed the transverse/sigmoid colon anastomosis with subsequent filling of the distended right and proximal transverse colon. No extravasation was identified. The anastomosis was difficult to evaluate given the patient's limited mobility and does appear somewhat narrowed. Overhead radiographs demonstrate some opacification of dilated distal small bowel.  IMPRESSION: 1. The transverse/sigmoid colon anastomosis is irregular, but  patent. There was no obstruction to retrograde filling of the proximal colon. No extravasation identified. 2. The sigmoid colon demonstrates extensive diverticulosis. 3. Progressive distension of the proximal colon and distal small bowel.   Electronically Signed   By: Roxy Horseman M.D.   On: 12/14/2012 14:36   Medications: Scheduled Meds: . acetaminophen  650 mg Rectal Q6H   Or  . acetaminophen  650 mg Oral Q6H  .  enoxaparin (LOVENOX) injection  30 mg Subcutaneous Q24H  . insulin aspart  0-9 Units Subcutaneous Q6H  . pantoprazole (PROTONIX) IV  40 mg Intravenous Q12H   Continuous Infusions: . 0.9 % NaCl with KCl 40 mEq / L 35 mL/hr (12/15/12 0959)  . Marland KitchenTPN (CLINIMIX-E) Adult 80 mL/hr at 12/14/12 1734   And  . fat emulsion 240 mL (12/14/12 1734)  . Marland KitchenTPN (CLINIMIX-E) Adult     And  . fat emulsion     PRN Meds:.bisacodyl, fentaNYL, hydrALAZINE, menthol-cetylpyridinium, methocarbamol (ROBAXIN) IV, midazolam, morphine injection, phenol, promethazine, sodium chloride  Assessment/Plan:  Principal Problem:  Abdominal pain - persistent and severe. Anastomotic site was patent but still with cecal dilatation and colonic ileus Continue IV Protonix - ileus may be starting to improve with bowel sounds today. GI to help [place] with decompression   Active Problems:  Pacemaker implanted 08/28/12 - functioning normally currently  Essential hypertension, benign - relatively well controlled with hydralazine, likely secondary to pain  Colonic mass - diagnostic of adenocarcinoma, resected Continue incentive spirometry  Physical therapy as soon as she is able  FEN - continue TPN. Continue NG tube.  Disposition: Patient is critically ill and LTAC is not appropriate at all for this patient!!  Patient requesting to be DO NOT RESUSCITATE but we'll continue current therapies      LOS: 10 days   Pearla Dubonnet, MD 12/15/2012, 1:02 PM

## 2012-12-15 NOTE — Progress Notes (Signed)
Physical Therapy Treatment Patient Details Name: Tamara [name] MRN: [id number] DOB: [birth year] Today's Date: 12/15/2012 Time: 4098-1191 PT Time Calculation (min): 26 min  PT Assessment / Plan / Recommendation  History of Present Illness 79 y/o WF admitted with GI bleed and s/p partialcolectomy after sigmoidoscopy revealed mass consistent with malignancy.   PT Comments   Pt continues to be in poor spirits. Requires max encouragement to participate in therapy but will if given positive reinforcement and encouragement. Pt desat on RA while amb to 85%; RN notified. Pt limited secondary to pain and fatigue. Pt encouraged to sit up in chair for at least 1 hour today and to continue to ambulate around unit with nursing 2-3x's a day. Will cont to follow per POC.   Follow Up Recommendations  SNF;Supervision/Assistance - 24 hour     Does the patient have the potential to tolerate intense rehabilitation     Barriers to Discharge        Equipment Recommendations  Rolling walker with 5" wheels    Recommendations for Other Services    Frequency Min 3X/week   Progress towards PT Goals Progress towards PT goals: Progressing toward goals  Plan Current plan remains appropriate    Precautions / Restrictions Precautions Precautions: Fall Precaution Comments: Abd surgery; NG tube Restrictions Weight Bearing Restrictions: No   Pertinent Vitals/Pain 7/10 at incision site.    Mobility  Bed Mobility Bed Mobility: Rolling Right;Right Sidelying to Sit Rolling Right: 5: Supervision;With rail Right Sidelying to Sit: 4: Min assist;HOB elevated Details for Bed Mobility Assistance: pt requires increased time secondary to pain and generalized weakness; (A) to acheive upright sitting position at EOB; cues for sequencing and technique  Transfers Transfers: Sit to Stand;Stand to Sit;Stand Pivot Transfers Sit to Stand: 4: Min assist;From bed;With upper extremity assist Stand to Sit: 4: Min assist;To  chair/3-in-1;With upper extremity assist;With armrests Stand Pivot Transfers: 4: Min assist Details for Transfer Assistance: pt initially performed SPT to [facility] secondary to feeling as though she needed to have BM urgently; HHA to maintain balance during transfers; pt unsteady with transfers; cues for hand placement and sequencing  Ambulation/Gait Ambulation/Gait Assistance: 4: Min assist Ambulation Distance (Feet): 80 Feet Assistive device: 1 person hand held assist Ambulation/Gait Assistance Details: continues to have guarded gt secondary to abd pain; pt encouraged to keep eyes open to amb;HR 120 with ambulation; cues for deep breathing exercises (difficult due to NG tube)  Gait Pattern: Decreased stride length;Narrow base of support;Shuffle Gait velocity: very decreased General Gait Details: pt demo SOB amb on RA O2 desat to 85%; RN notified  Stairs: No Wheelchair Mobility Wheelchair Mobility: No    Exercises General Exercises - Lower Extremity Ankle Circles/Pumps: AROM;Both;10 reps;Seated Hip Flexion/Marching: AROM;Strengthening;Both;10 reps   PT Diagnosis:    PT Problem List:   PT Treatment Interventions:     PT Goals (current goals can now be found in the care plan section) Acute Rehab PT Goals Patient Stated Goal: To get stronger and go home PT Goal Formulation: With patient/family Time For Goal Achievement: 12/22/12 Potential to Achieve Goals: Good  Visit Information  Last PT Received On: 12/15/12 Assistance Needed: +1 History of Present Illness: 79 y/o WF admitted with GI bleed and s/p partialcolectomy after sigmoidoscopy revealed mass consistent with malignancy.    Subjective Data  Subjective: pt lying supine; agreeable to therapy with max encouragement  Patient Stated Goal: To get stronger and go home   Cognition  Cognition Arousal/Alertness: Awake/alert Behavior During Therapy: [facility]  for tasks assessed/performed Overall Cognitive Status: Within Functional Limits for  tasks assessed    Balance  Balance Balance Assessed: Yes Static Standing Balance Static Standing - Balance Support: Right upper extremity supported;During functional activity Static Standing - Level of Assistance: 4: Min assist  End of Session PT - End of Session Equipment Utilized During Treatment: Gait belt Activity Tolerance: Patient tolerated treatment well Patient left: in chair;with call bell/phone within reach;with nursing/sitter in room Nurse Communication: Mobility status   GP     Donell Sievert, [place] [phone] 12/15/2012, 11:52 AM

## 2012-12-16 ENCOUNTER — Inpatient Hospital Stay (HOSPITAL_COMMUNITY): Payer: Medicare Other

## 2012-12-16 DIAGNOSIS — E46 Unspecified protein-calorie malnutrition: Secondary | ICD-10-CM

## 2012-12-16 LAB — CBC WITH DIFFERENTIAL/PLATELET
Basophils Relative: 0 % (ref 0–1)
Eosinophils Absolute: 0.2 10*3/uL (ref 0.0–0.7)
Eosinophils Relative: 2 % (ref 0–5)
Hemoglobin: 9.3 g/dL — ABNORMAL LOW (ref 12.0–15.0)
Lymphocytes Relative: 15 % (ref 12–46)
Lymphs Abs: 1.3 10*3/uL (ref 0.7–4.0)
MCH: 31 pg (ref 26.0–34.0)
MCV: 89.7 fL (ref 78.0–100.0)
Monocytes Relative: 13 % — ABNORMAL HIGH (ref 3–12)
Neutrophils Relative %: 70 % (ref 43–77)
RBC: 3 MIL/uL — ABNORMAL LOW (ref 3.87–5.11)
WBC: 8.6 10*3/uL (ref 4.0–10.5)

## 2012-12-16 LAB — BASIC METABOLIC PANEL
BUN: 21 mg/dL (ref 6–23)
CO2: 22 mEq/L (ref 19–32)
Calcium: 8.7 mg/dL (ref 8.4–10.5)
GFR calc non Af Amer: 89 mL/min — ABNORMAL LOW (ref >90)
Glucose, Bld: 125 mg/dL — ABNORMAL HIGH (ref 70–99)
Potassium: 4.2 mEq/L (ref 3.5–5.1)

## 2012-12-16 LAB — GLUCOSE, CAPILLARY
Glucose-Capillary: 122 mg/dL — ABNORMAL HIGH (ref 70–99)
Glucose-Capillary: 127 mg/dL — ABNORMAL HIGH (ref 70–99)
Glucose-Capillary: 128 mg/dL — ABNORMAL HIGH (ref 70–99)

## 2012-12-16 MED ORDER — FAT EMULSION 20 % IV EMUL
250.0000 mL | INTRAVENOUS | Status: AC
  Administered 2012-12-16: 250 mL via INTRAVENOUS
  Filled 2012-12-16: qty 250

## 2012-12-16 MED ORDER — TRACE MINERALS CR-CU-F-FE-I-MN-MO-SE-ZN IV SOLN
INTRAVENOUS | Status: AC
  Administered 2012-12-16: 18:00:00 via INTRAVENOUS
  Filled 2012-12-16: qty 2000

## 2012-12-16 NOTE — Progress Notes (Signed)
PARENTERAL NUTRITION CONSULT NOTE - Follow Up  Pharmacy Consult:  TPN Indication: Prolonged post-op ileus  Allergies  Allergen Reactions  . Codeine Nausea And Vomiting  . Zofran [Ondansetron Hcl]    Patient Measurements: Height: 5\' 5"  (165.1 cm) Weight: 142 lb 8 oz (64.638 kg) IBW/kg (Calculated) : 57  Vital Signs: Temp: 98.4 F (36.9 C) (11/22 0601) Temp src: Oral (11/22 0601) BP: 165/75 mmHg (11/22 0601) Pulse Rate: 104 (11/22 0601)  Labs:  Recent Labs  12/14/12 1122 12/15/12 0500 12/16/12 0555  WBC 9.0 7.4 8.6  HGB 9.8* 9.2* 9.3*  HCT 27.8* 26.1* 26.9*  PLT 271 267 295    Recent Labs  12/14/12 0505 12/15/12 0500 12/16/12 0555  NA 129* 130* 126*  K 3.5 4.1 4.2  CL 97 98 96  CO2 23 25 22   GLUCOSE 140* 130* 125*  BUN 17 22 21   CREATININE 0.44* 0.44* 0.49*  CALCIUM 8.8 8.9 8.7  MG 2.0  --   --   PHOS 2.6  --   --   PROT 5.8*  --   --   ALBUMIN 2.8*  --   --   AST 41*  --   --   ALT 36*  --   --   ALKPHOS 79  --   --   BILITOT 0.3  --   --    Estimated Creatinine Clearance: 49.6 ml/min (by C-G formula based on Cr of 0.49).   Recent Labs  12/16/12 0026 12/16/12 0556 12/16/12 0641  GLUCAP 128* 127* 122*     Insulin Requirements in the past 24 hours:  3 units SSI  Assessment:  48 YOF s/p lap-assisted transverse colectomy on 12/07/12 for partially obstructin transverse colon mass. She continues on TPN for post-op ileus, which seems to be improving per MD.  GI: intermittent nausea and severe abd pain/discomfort, s/p decompression for cecal dilatation 11/21.  NG O/P increased to but negative for brown/bloody aspirate, stool O/P significant at .  TG WNL, prealbumin low end of normal (17.5) Endo: no hx DM - CBGs well controlled with minimal SSI use at goal TPN rate Lytes: hyponatremia worsening (Na+ 126), others WNL Renal: SCr 0.49 (stable), CrCL 50 ml/min, I/O's inaccurate, NS with KCL at 35 ml/hr (provides ~74mEq KCL daily) Pulm:  stable on RA Cards: hx HTN - BP elevated, HR trending up (lisinopril PTA) - PRN hydralazine for SBP > 180 (has not needed) Hepatobil: LFTs WNL at baseline - has trended up slightly Heme/Onc: hx breast cancer - hgb 9.3, plts WNL (stable), FOB+ on 11/10 and 11/17 Neuro: A&O - scheduled APAP, not optimistic about her condition improving ID: afebrile, WBC WNL, C.diff PCR negative - not on abx Best Practices: Lovenox, SCDs TPN Access: PICC placed 12/11/12 TPN day#: 4 (11/18 >> )  Current Nutrition:  Clinimix E 5/15 at 80 ml/hr + IVFE at 10 ml/hr   Estimated Nutritional Needs:  1700 - 1950 kCal; 85-95 g protein daily  Plan:  - Continue Clinimix E 5/15 at 80 ml/hr + IVFE at 10 ml/hr, providing 1843 kCal and 96 gm protein daily, meeting 100% of needs - Daily multivitamin and trace elements - D/C SSI and CBG checks - F/U daily    Marcelino Campos D. Laney Potash, PharmD, BCPS Pager:  [phone] 12/16/2012, 7:56 AM

## 2012-12-16 NOTE — Progress Notes (Signed)
Pt has started feeling poorly in the past several hrs--"hurting all over," "back is killing me,"  "I need the bedpan quick," states she vomited this morning (despite NG tube), miserable.  Still not passing gas.  Abd less distended (per pt) compared to pre-colonoscopy.    EXAM:  Whining, moaning, abd slt distended, tympanitic, mod firm, slt tender  IMPR:  Not clear why pt is now feeling worse than earlier this a.m.  PLAN:  Will get KUB now and again in a.m. (to look for re-accumluation of gas)  Tamara [name], M.D. [phone]

## 2012-12-16 NOTE — Progress Notes (Signed)
Patient ID: Tamara [name], female   DOB: Sep 11, 1929, 79 y.o.   MRN: [id number]  Subjective: Pt states she feels better following decompression of bowel yesterday.  Still has not passed flatus.  Has been getting up to chair and BSC more.  Abdominal pain is minimal.    Objective:  Vital signs:  Filed Vitals:   12/15/12 1550 12/15/12 1626 12/15/12 2146 12/16/12 0601  BP: 110/48 133/72 161/73 165/75  Pulse: 98 98 96 104  Temp:  97.6 F (36.4 C) 98.2 F (36.8 C) 98.4 F (36.9 C)  TempSrc:  Oral Oral Oral  Resp: 17 17 17 18   Height:      Weight:      SpO2: 95% 99% 98% 97%    Last BM Date: 12/14/12  Intake/Output   Yesterday:  11/21 0701 - 11/22 0700 In: 513.1 [I.V.:448.1; IV Piggyback:65] Out: 2575 [Urine:350; Emesis/NG output:425; Stool:1800]     Physical Exam: General: Pt awake/alert/oriented x3 in no acute distress Chest: CTA No chest wall pain w good excursion CV:  Pulses intact.  Regular rhythm MS: Normal AROM mjr joints.  No obvious deformity Abdomen: Soft, distended, mild tenderness, +BS, no HSM, incisions C/D/I with staples in place Ext:  SCDs BLE.  No mjr edema.  No cyanosis Skin: No petechiae / purpura   Problem List:   Principal Problem:   GI bleed Active Problems:   Pacemaker implanted 08/28/12   Essential hypertension, benign   D (diarrhea)   Colonic mass   Gastric bleed   Ileus, postoperative    Results:   Labs: Results for orders placed during the hospital encounter of 12/05/12 (from the past 48 hour(s))  CBC WITH DIFFERENTIAL     Status: Abnormal   Collection Time    12/14/12 11:22 AM      Result Value Range   WBC 9.0  4.0 - 10.5 K/uL   RBC 3.17 (*) 3.87 - 5.11 MIL/uL   Hemoglobin 9.8 (*) 12.0 - 15.0 g/dL   HCT 59.5 (*) 63.8 - 75.6 %   MCV 87.7  78.0 - 100.0 fL   MCH 30.9  26.0 - 34.0 pg   MCHC 35.3  30.0 - 36.0 g/dL   RDW 43.3 (*) 29.5 - 18.8 %   Platelets 271  150 - 400 K/uL   Neutrophils Relative % 74  43 - 77 %   Neutro Abs 6.7   1.7 - 7.7 K/uL   Lymphocytes Relative 14  12 - 46 %   Lymphs Abs 1.2  0.7 - 4.0 K/uL   Monocytes Relative 11  3 - 12 %   Monocytes Absolute 1.0  0.1 - 1.0 K/uL   Eosinophils Relative 1  0 - 5 %   Eosinophils Absolute 0.1  0.0 - 0.7 K/uL   Basophils Relative 0  0 - 1 %   Basophils Absolute 0.0  0.0 - 0.1 K/uL  GLUCOSE, CAPILLARY     Status: Abnormal   Collection Time    12/14/12 12:11 PM      Result Value Range   Glucose-Capillary 132 (*) 70 - 99 mg/dL  GLUCOSE, CAPILLARY     Status: Abnormal   Collection Time    12/14/12  5:35 PM      Result Value Range   Glucose-Capillary 132 (*) 70 - 99 mg/dL  GLUCOSE, CAPILLARY     Status: Abnormal   Collection Time    12/15/12 12:07 AM      Result Value Range   Glucose-Capillary  129 (*) 70 - 99 mg/dL   Comment 1 Notify RN     Comment 2 Documented in Chart    CBC WITH DIFFERENTIAL     Status: Abnormal   Collection Time    12/15/12  5:00 AM      Result Value Range   WBC 7.4  4.0 - 10.5 K/uL   RBC 2.95 (*) 3.87 - 5.11 MIL/uL   Hemoglobin 9.2 (*) 12.0 - 15.0 g/dL   HCT 40.9 (*) 81.1 - 91.4 %   MCV 88.5  78.0 - 100.0 fL   MCH 31.2  26.0 - 34.0 pg   MCHC 35.2  30.0 - 36.0 g/dL   RDW 78.2 (*) 95.6 - 21.3 %   Platelets 267  150 - 400 K/uL   Neutrophils Relative % 69  43 - 77 %   Neutro Abs 5.1  1.7 - 7.7 K/uL   Lymphocytes Relative 15  12 - 46 %   Lymphs Abs 1.1  0.7 - 4.0 K/uL   Monocytes Relative 14 (*) 3 - 12 %   Monocytes Absolute 1.0  0.1 - 1.0 K/uL   Eosinophils Relative 2  0 - 5 %   Eosinophils Absolute 0.1  0.0 - 0.7 K/uL   Basophils Relative 0  0 - 1 %   Basophils Absolute 0.0  0.0 - 0.1 K/uL  BASIC METABOLIC PANEL     Status: Abnormal   Collection Time    12/15/12  5:00 AM      Result Value Range   Sodium 130 (*) 135 - 145 mEq/L   Potassium 4.1  3.5 - 5.1 mEq/L   Chloride 98  96 - 112 mEq/L   CO2 25  19 - 32 mEq/L   Glucose, Bld 130 (*) 70 - 99 mg/dL   BUN 22  6 - 23 mg/dL   Creatinine, Ser 0.86 (*) 0.50 - 1.10 mg/dL    Calcium 8.9  8.4 - 57.8 mg/dL   GFR calc non Af Amer >90  >90 mL/min   GFR calc Af Amer >90  >90 mL/min   Comment: (NOTE)     The eGFR has been calculated using the CKD EPI equation.     This calculation has not been validated in all clinical situations.     eGFR's persistently <90 mL/min signify possible Chronic Kidney     Disease.  GLUCOSE, CAPILLARY     Status: Abnormal   Collection Time    12/15/12  6:14 AM      Result Value Range   Glucose-Capillary 122 (*) 70 - 99 mg/dL   Comment 1 Notify RN     Comment 2 Documented in Chart    GLUCOSE, CAPILLARY     Status: Abnormal   Collection Time    12/15/12 12:08 PM      Result Value Range   Glucose-Capillary 139 (*) 70 - 99 mg/dL  GLUCOSE, CAPILLARY     Status: Abnormal   Collection Time    12/15/12  6:01 PM      Result Value Range   Glucose-Capillary 119 (*) 70 - 99 mg/dL   Comment 1 Notify RN    GLUCOSE, CAPILLARY     Status: Abnormal   Collection Time    12/16/12 12:26 AM      Result Value Range   Glucose-Capillary 128 (*) 70 - 99 mg/dL  CBC WITH DIFFERENTIAL     Status: Abnormal   Collection Time    12/16/12  5:55 AM  Result Value Range   WBC 8.6  4.0 - 10.5 K/uL   RBC 3.00 (*) 3.87 - 5.11 MIL/uL   Hemoglobin 9.3 (*) 12.0 - 15.0 g/dL   HCT 16.1 (*) 09.6 - 04.5 %   MCV 89.7  78.0 - 100.0 fL   MCH 31.0  26.0 - 34.0 pg   MCHC 34.6  30.0 - 36.0 g/dL   RDW 40.9 (*) 81.1 - 91.4 %   Platelets 295  150 - 400 K/uL   Neutrophils Relative % 70  43 - 77 %   Neutro Abs 6.0  1.7 - 7.7 K/uL   Lymphocytes Relative 15  12 - 46 %   Lymphs Abs 1.3  0.7 - 4.0 K/uL   Monocytes Relative 13 (*) 3 - 12 %   Monocytes Absolute 1.1 (*) 0.1 - 1.0 K/uL   Eosinophils Relative 2  0 - 5 %   Eosinophils Absolute 0.2  0.0 - 0.7 K/uL   Basophils Relative 0  0 - 1 %   Basophils Absolute 0.0  0.0 - 0.1 K/uL  BASIC METABOLIC PANEL     Status: Abnormal   Collection Time    12/16/12  5:55 AM      Result Value Range   Sodium 126 (*) 135 - 145  mEq/L   Potassium 4.2  3.5 - 5.1 mEq/L   Chloride 96  96 - 112 mEq/L   CO2 22  19 - 32 mEq/L   Glucose, Bld 125 (*) 70 - 99 mg/dL   BUN 21  6 - 23 mg/dL   Creatinine, Ser 7.82 (*) 0.50 - 1.10 mg/dL   Calcium 8.7  8.4 - 95.6 mg/dL   GFR calc non Af Amer 89 (*) >90 mL/min   GFR calc Af Amer >90  >90 mL/min   Comment: (NOTE)     The eGFR has been calculated using the CKD EPI equation.     This calculation has not been validated in all clinical situations.     eGFR's persistently <90 mL/min signify possible Chronic Kidney     Disease.  GLUCOSE, CAPILLARY     Status: Abnormal   Collection Time    12/16/12  5:56 AM      Result Value Range   Glucose-Capillary 127 (*) 70 - 99 mg/dL   Comment 1 Notify RN     Comment 2 Documented in Chart    GLUCOSE, CAPILLARY     Status: Abnormal   Collection Time    12/16/12  6:41 AM      Result Value Range   Glucose-Capillary 122 (*) 70 - 99 mg/dL    Imaging / Studies: Dg Colon W/water Sol Cm  12/14/2012   CLINICAL DATA:  Post-op laparoscopic assisted transverse colectomy 12/07/2012. Continued abdominal pain and distension.  EXAM: COLON WITH WATER SOLUTION CONTRAST  TECHNIQUE: Initial scout AP supine abdominal image obtained to insure adequate colon cleansing. Omnipaque was introduced into the colon in a retrograde fashion and refluxed from the rectum to the cecum. Spot images of the colon followed by overhead radiographs were obtained.  COMPARISON:  One view abdomen 11/1912. Abdominal pelvic CT 12/12/2012.  FLUOROSCOPY TIME:  1 min and 3 seconds.  FINDINGS: The scout abdominal radiograph demonstrates a nasogastric tube is in the proximal stomach. There is persistent diffuse small bowel and proximal colonic distention. The cecum measures approximately 13.7 cm, increased from yesterday. Abdominal skin staples and right hip osteoarthritis are noted.  The contrast filled the rectum and passed  through the sigmoid colon which demonstrates extensive  diverticulosis. The contrast rapidly crossed the transverse/sigmoid colon anastomosis with subsequent filling of the distended right and proximal transverse colon. No extravasation was identified. The anastomosis was difficult to evaluate given the patient's limited mobility and does appear somewhat narrowed. Overhead radiographs demonstrate some opacification of dilated distal small bowel.  IMPRESSION: 1. The transverse/sigmoid colon anastomosis is irregular, but patent. There was no obstruction to retrograde filling of the proximal colon. No extravasation identified. 2. The sigmoid colon demonstrates extensive diverticulosis. 3. Progressive distension of the proximal colon and distal small bowel.   Electronically Signed   By: Roxy Horseman M.D.   On: 12/14/2012 14:36    Medications / Allergies: per chart  Antibiotics: Anti-infectives   Start     Dose/Rate Route Frequency Ordered Stop   12/07/12 0600  cefoTEtan (CEFOTAN) 2 g in dextrose 5 % 50 mL IVPB     2 g 100 mL/hr over 30 Minutes Intravenous On call to O.R. 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 -NGT -still no flatus, +bs, still remains distended.  Will discuss with attending clamping NGT today -continue pain meds -DVT proph: ambulation, SCD's, lovenox  -encourage mobility. -staples not quite ready -IS   Ashok Norris, [facility] Surgery Pager [phone] Office [phone]  12/16/2012 10:34 AM

## 2012-12-16 NOTE — Progress Notes (Signed)
Subjective: She states she feels better after fluid removed from colon yesterday, however still no bm or gas  Objective: Vital signs in last 24 hours: Temp:  [97.6 F (36.4 C)-98.4 F (36.9 C)] 98.4 F (36.9 C) (11/22 0601) Pulse Rate:  [93-104] 104 (11/22 0601) Resp:  [16-23] 18 (11/22 0601) BP: (110-193)/(48-85) 165/75 mmHg (11/22 0601) SpO2:  [95 %-100 %] 97 % (11/22 0601) Weight change:  Last BM Date: 12/14/12  Intake/Output from previous day: 11/21 0701 - 11/22 0700 In: 513.1 [I.V.:448.1; IV Piggyback:65] Out: 2575 [Urine:350; Emesis/NG output:425; Stool:1800] Intake/Output this shift:    Resp: clear to auscultation bilaterally Cardio: regular rate and rhythm, S1, S2 normal, no murmur, click, rub or gallop GI: incision looks good,bowel sounds present, abdomin is distended  Lab Results:  Recent Labs  12/15/12 0500 12/16/12 0555  WBC 7.4 8.6  HGB 9.2* 9.3*  HCT 26.1* 26.9*  PLT 267 295   BMET  Recent Labs  12/15/12 0500 12/16/12 0555  NA 130* 126*  K 4.1 4.2  CL 98 96  CO2 25 22  GLUCOSE 130* 125*  BUN 22 21  CREATININE 0.44* 0.49*  CALCIUM 8.9 8.7    Studies/Results: Dg Colon W/water Sol Cm  12/14/2012   CLINICAL DATA:  Post-op laparoscopic assisted transverse colectomy 12/07/2012. Continued abdominal pain and distension.  EXAM: COLON WITH WATER SOLUTION CONTRAST  TECHNIQUE: Initial scout AP supine abdominal image obtained to insure adequate colon cleansing. Omnipaque was introduced into the colon in a retrograde fashion and refluxed from the rectum to the cecum. Spot images of the colon followed by overhead radiographs were obtained.  COMPARISON:  One view abdomen 11/1912. Abdominal pelvic CT 12/12/2012.  FLUOROSCOPY TIME:  1 min and 3 seconds.  FINDINGS: The scout abdominal radiograph demonstrates a nasogastric tube is in the proximal stomach. There is persistent diffuse small bowel and proximal colonic distention. The cecum measures approximately 13.7  cm, increased from yesterday. Abdominal skin staples and right hip osteoarthritis are noted.  The contrast filled the rectum and passed through the sigmoid colon which demonstrates extensive diverticulosis. The contrast rapidly crossed the transverse/sigmoid colon anastomosis with subsequent filling of the distended right and proximal transverse colon. No extravasation was identified. The anastomosis was difficult to evaluate given the patient's limited mobility and does appear somewhat narrowed. Overhead radiographs demonstrate some opacification of dilated distal small bowel.  IMPRESSION: 1. The transverse/sigmoid colon anastomosis is irregular, but patent. There was no obstruction to retrograde filling of the proximal colon. No extravasation identified. 2. The sigmoid colon demonstrates extensive diverticulosis. 3. Progressive distension of the proximal colon and distal small bowel.   Electronically Signed   By: Roxy Horseman M.D.   On: 12/14/2012 14:36    Medications:  Scheduled: . acetaminophen  650 mg Rectal Q6H   Or  . acetaminophen  650 mg Oral Q6H  . enoxaparin (LOVENOX) injection  30 mg Subcutaneous Q24H  . pantoprazole (PROTONIX) IV  40 mg Intravenous Q12H    Assessment/Plan: 1.ileus, good result symptomatically after decompression but still no evidence of bowel function returning. 2. Hyponatremia will continue normal saline no signs of volume overload' 3. htn good control with some variability due to pain   LOS: 11 days   Alfie Rideaux THOMAS 12/16/2012, 8:06 AM

## 2012-12-17 ENCOUNTER — Inpatient Hospital Stay (HOSPITAL_COMMUNITY): Payer: Medicare Other

## 2012-12-17 LAB — BASIC METABOLIC PANEL
BUN: 18 mg/dL (ref 6–23)
CO2: 24 mEq/L (ref 19–32)
Calcium: 8.8 mg/dL (ref 8.4–10.5)
Chloride: 96 mEq/L (ref 96–112)
Creatinine, Ser: 0.42 mg/dL — ABNORMAL LOW (ref 0.50–1.10)
GFR calc Af Amer: >90 mL/min (ref >90)
GFR calc non Af Amer: >90 mL/min (ref >90)
Glucose, Bld: 110 mg/dL — ABNORMAL HIGH (ref 70–99)

## 2012-12-17 MED ORDER — FAT EMULSION 20 % IV EMUL
250.0000 mL | INTRAVENOUS | Status: AC
  Administered 2012-12-17: 250 mL via INTRAVENOUS
  Filled 2012-12-17: qty 250

## 2012-12-17 MED ORDER — TRACE MINERALS CR-CU-F-FE-I-MN-MO-SE-ZN IV SOLN
INTRAVENOUS | Status: AC
  Administered 2012-12-17: 18:00:00 via INTRAVENOUS
  Filled 2012-12-17: qty 2000

## 2012-12-17 MED ORDER — METOCLOPRAMIDE HCL 5 MG/ML IJ SOLN
5.0000 mg | Freq: Three times a day (TID) | INTRAMUSCULAR | Status: DC
  Administered 2012-12-17 – 2012-12-22 (×20): 5 mg via INTRAVENOUS
  Filled 2012-12-17 (×24): qty 1

## 2012-12-17 MED ORDER — POTASSIUM CHLORIDE 10 MEQ/50ML IV SOLN
10.0000 meq | INTRAVENOUS | Status: AC
  Administered 2012-12-17 (×2): 10 meq via INTRAVENOUS
  Filled 2012-12-17 (×2): qty 50

## 2012-12-17 MED ORDER — FENTANYL CITRATE 0.05 MG/ML IJ SOLN
50.0000 ug | INTRAMUSCULAR | Status: DC | PRN
  Administered 2012-12-17 – 2012-12-21 (×6): 50 ug via INTRAVENOUS
  Filled 2012-12-17 (×6): qty 2

## 2012-12-17 NOTE — Progress Notes (Signed)
Patient ID: Tamara [name], female   DOB: March 17, 1929, 79 y.o.   MRN: [id number]  Subjective: Pt reports having a small bm this morning, loose, no hematochezia.  C/o nausea.    Objective:  Vital signs:  Filed Vitals:   12/16/12 0601 12/16/12 1455 12/16/12 2129 12/17/12 0519  BP: 165/75 188/85 154/76 173/72  Pulse: 104 98 86 100  Temp: 98.4 F (36.9 C) 98.4 F (36.9 C) 98 F (36.7 C) 97.7 F (36.5 C)  TempSrc: Oral Oral Oral Oral  Resp: 18 20 18 18   Height:      Weight:    136 lb 14.4 oz (62.097 kg)  SpO2: 97% 96% 96% 96%    Last BM Date: 12/17/12  Intake/Output   Yesterday:  11/22 0701 - 11/23 0700 In: 1454.2 [I.V.:407.2; TPN:1047] Out: 2675 [Urine:350; Emesis/NG output:2325] This shift:    I/O last 3 completed shifts: In: 1454.2 [I.V.:407.2] Out: 3025 [Urine:700; Emesis/NG output:2325]    Physical Exam:  General: Pt awake/alert/oriented x3 in no acute distress  Chest: CTA No chest wall pain w good excursion  CV: Pulses intact. Regular rhythm  MS: Normal AROM mjr joints. No obvious deformity  Abdomen: Soft, distended, mild tenderness, +BS, no HSM, incisions C/D/I with staples in place  Ext: SCDs BLE. No mjr edema. No cyanosis  Skin: No petechiae / purpura   Problem List:   Principal Problem:   GI bleed Active Problems:   Pacemaker implanted 08/28/12   Essential hypertension, benign   D (diarrhea)   Colonic mass   Gastric bleed   Ileus, postoperative    Results:   Labs: Results for orders placed during the hospital encounter of 12/05/12 (from the past 48 hour(s))  GLUCOSE, CAPILLARY     Status: Abnormal   Collection Time    12/15/12 12:08 PM      Result Value Range   Glucose-Capillary 139 (*) 70 - 99 mg/dL  GLUCOSE, CAPILLARY     Status: Abnormal   Collection Time    12/15/12  6:01 PM      Result Value Range   Glucose-Capillary 119 (*) 70 - 99 mg/dL   Comment 1 Notify RN    GLUCOSE, CAPILLARY     Status: Abnormal   Collection Time    12/16/12  12:26 AM      Result Value Range   Glucose-Capillary 128 (*) 70 - 99 mg/dL  CBC WITH DIFFERENTIAL     Status: Abnormal   Collection Time    12/16/12  5:55 AM      Result Value Range   WBC 8.6  4.0 - 10.5 K/uL   RBC 3.00 (*) 3.87 - 5.11 MIL/uL   Hemoglobin 9.3 (*) 12.0 - 15.0 g/dL   HCT 40.9 (*) 81.1 - 91.4 %   MCV 89.7  78.0 - 100.0 fL   MCH 31.0  26.0 - 34.0 pg   MCHC 34.6  30.0 - 36.0 g/dL   RDW 78.2 (*) 95.6 - 21.3 %   Platelets 295  150 - 400 K/uL   Neutrophils Relative % 70  43 - 77 %   Neutro Abs 6.0  1.7 - 7.7 K/uL   Lymphocytes Relative 15  12 - 46 %   Lymphs Abs 1.3  0.7 - 4.0 K/uL   Monocytes Relative 13 (*) 3 - 12 %   Monocytes Absolute 1.1 (*) 0.1 - 1.0 K/uL   Eosinophils Relative 2  0 - 5 %   Eosinophils Absolute 0.2  0.0 -  0.7 K/uL   Basophils Relative 0  0 - 1 %   Basophils Absolute 0.0  0.0 - 0.1 K/uL  BASIC METABOLIC PANEL     Status: Abnormal   Collection Time    12/16/12  5:55 AM      Result Value Range   Sodium 126 (*) 135 - 145 mEq/L   Potassium 4.2  3.5 - 5.1 mEq/L   Chloride 96  96 - 112 mEq/L   CO2 22  19 - 32 mEq/L   Glucose, Bld 125 (*) 70 - 99 mg/dL   BUN 21  6 - 23 mg/dL   Creatinine, Ser 4.09 (*) 0.50 - 1.10 mg/dL   Calcium 8.7  8.4 - 81.1 mg/dL   GFR calc non Af Amer 89 (*) >90 mL/min   GFR calc Af Amer >90  >90 mL/min   Comment: (NOTE)     The eGFR has been calculated using the CKD EPI equation.     This calculation has not been validated in all clinical situations.     eGFR's persistently <90 mL/min signify possible Chronic Kidney     Disease.  GLUCOSE, CAPILLARY     Status: Abnormal   Collection Time    12/16/12  5:56 AM      Result Value Range   Glucose-Capillary 127 (*) 70 - 99 mg/dL   Comment 1 Notify RN     Comment 2 Documented in Chart    GLUCOSE, CAPILLARY     Status: Abnormal   Collection Time    12/16/12  6:41 AM      Result Value Range   Glucose-Capillary 122 (*) 70 - 99 mg/dL  BASIC METABOLIC PANEL     Status:  Abnormal   Collection Time    12/17/12  5:15 AM      Result Value Range   Sodium 128 (*) 135 - 145 mEq/L   Potassium 3.9  3.5 - 5.1 mEq/L   Chloride 96  96 - 112 mEq/L   CO2 24  19 - 32 mEq/L   Glucose, Bld 110 (*) 70 - 99 mg/dL   BUN 18  6 - 23 mg/dL   Creatinine, Ser 9.14 (*) 0.50 - 1.10 mg/dL   Calcium 8.8  8.4 - 78.2 mg/dL   GFR calc non Af Amer >90  >90 mL/min   GFR calc Af Amer >90  >90 mL/min   Comment: (NOTE)     The eGFR has been calculated using the CKD EPI equation.     This calculation has not been validated in all clinical situations.     eGFR's persistently <90 mL/min signify possible Chronic Kidney     Disease.    Imaging / Studies: Dg Abd Portable 1v  12/17/2012   CLINICAL DATA:  Postoperative ileus and status post resection of transverse colonic carcinoma.  EXAM: PORTABLE ABDOMEN - 1 VIEW  COMPARISON:  None.  FINDINGS: A nasogastric tube shows stable positioning in the stomach. Small bowel ileus is stable to slightly improved with current maximal small bowel caliber of approximately 4 cm. On the prior film, maximal caliber is measured at approximately 4.8 cm. No gross signs of free air or pneumatosis.  IMPRESSION: Stable to slightly improved small bowel ileus.   Electronically Signed   By: Irish Lack M.D.   On: 12/17/2012 07:59   Dg Abd Portable 1v  12/16/2012   CLINICAL DATA:  Abdominal distention.  EXAM: PORTABLE ABDOMEN - 1 VIEW  COMPARISON:  12/14/2012 barium enema.  FINDINGS: Continued gaseous distention of large and small bowel. The cecal distention has decreased since prior study. Small bowel distention is similar. NG tube remains present in the stomach. No supine evidence of free air. Prior cholecystectomy.  IMPRESSION: Stable small bowel dilatation.  Decreasing cecal distention.   Electronically Signed   By: Charlett Nose M.D.   On: 12/16/2012 20:26    Medications / Allergies: per chart  Antibiotics: Anti-infectives   Start     Dose/Rate Route  Frequency Ordered Stop   12/07/12 0600  cefoTEtan (CEFOTAN) 2 g in dextrose 5 % 50 mL IVPB     2 g 100 mL/hr over 30 Minutes Intravenous On call to O.R. 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  -NGT 2363ml/24h bilious output, she is taking in sips of water and ice chips  -had a small bm yesterday, but remains fairly distended.  Slightly improved KUB today.  Continue with NGT.  Add reglan to see if it will help with her nausea. -continue pain meds  -DVT proph: ambulation, SCD's, lovenox  -encourage mobility.  -DC staples and apply steri-strips -IS  Ashok Norris, [facility] Surgery Pager [phone] Office [phone]  12/17/2012 10:05 AM

## 2012-12-17 NOTE — Progress Notes (Signed)
PARENTERAL NUTRITION CONSULT NOTE - Follow Up  Pharmacy Consult:  TPN Indication: Prolonged post-op ileus  Allergies  Allergen Reactions  . Codeine Nausea And Vomiting  . Zofran [Ondansetron Hcl]    Patient Measurements: Height: 5\' 5"  (165.1 cm) Weight: 136 lb 14.4 oz (62.097 kg) IBW/kg (Calculated) : 57  Vital Signs: Temp: 97.7 F (36.5 C) (11/23 0519) Temp src: Oral (11/23 0519) BP: 173/72 mmHg (11/23 0519) Pulse Rate: 100 (11/23 0519)  Labs:  Recent Labs  12/14/12 1122 12/15/12 0500 12/16/12 0555  WBC 9.0 7.4 8.6  HGB 9.8* 9.2* 9.3*  HCT 27.8* 26.1* 26.9*  PLT 271 267 295    Recent Labs  12/15/12 0500 12/16/12 0555 12/17/12 0515  NA 130* 126* 128*  K 4.1 4.2 3.9  CL 98 96 96  CO2 25 22 24   GLUCOSE 130* 125* 110*  BUN 22 21 18   CREATININE 0.44* 0.49* 0.42*  CALCIUM 8.9 8.7 8.8   Estimated Creatinine Clearance: 49.6 ml/min (by C-G formula based on Cr of 0.42).   Recent Labs  12/16/12 0026 12/16/12 0556 12/16/12 0641  GLUCAP 128* 127* 122*     Insulin Requirements in the past 24 hours:  None - SSI d/c'ed  Assessment:  82 YOF s/p lap-assisted transverse colectomy on 12/07/12 for partially obstruction transverse colon mass. She continues on TPN for post-op ileus, which seems to be improving per MD.  GI: intermittent nausea and severe abd pain/discomfort, s/p decompression for cecal dilatation 11/21.  NG O/P increased to , no stool O/P reported, TG WNL, prealbumin low end of normal (17.5).  No flatus/BM Endo: no hx DM - CBGs well controlled at goal TPN rate, SSI d/c'ed 11/22 Lytes: hyponatremia improving (Na+ 128), K+ 3.9 (goal ~4 for ileus), others WNL Renal: SCr 0.42 (stable), CrCL 50 ml/min, I/O's inaccurate, NS with KCL at 35 ml/hr (provides ~59mEq KCL daily) Pulm: stable on RA Cards: hx HTN - BP elevated, HR trending up (lisinopril PTA) - PRN hydralazine for SBP > 180 (has not needed) Hepatobil: LFTs WNL at baseline - has trended  up slightly Heme/Onc: hx breast cancer - hgb 9.3, plts WNL (stable), FOB+ on 11/10 and 11/17 Neuro: A&O - scheduled APAP, not optimistic about her condition improving ID: afebrile, WBC WNL, C.diff PCR negative - not on abx Best Practices: Lovenox, SCDs TPN Access: PICC placed 12/11/12 TPN day#: 5 (11/18 >> )  Current Nutrition:  Clinimix E 5/15 at 80 ml/hr + IVFE at 10 ml/hr   Estimated Nutritional Needs:  1700 - 1950 kCal; 85-95 g protein daily  Plan:  - Continue Clinimix E 5/15 at 80 ml/hr + IVFE at 10 ml/hr, providing 1843 kCal and 96 gm protein daily, meeting 100% of needs - Daily multivitamin and trace elements - KCL x 2 runs - F/U KUB and AM labs    Genny Caulder D. Laney Potash, PharmD, BCPS Pager:  [phone] 12/17/2012, 7:59 AM

## 2012-12-17 NOTE — Progress Notes (Signed)
Subjective: Better night no n/v or abdominal pain however no gas or bm  Objective: Vital signs in last 24 hours: Temp:  [97.7 F (36.5 C)-98.4 F (36.9 C)] 97.7 F (36.5 C) (11/23 0519) Pulse Rate:  [86-100] 100 (11/23 0519) Resp:  [18-20] 18 (11/23 0519) BP: (154-188)/(72-85) 173/72 mmHg (11/23 0519) SpO2:  [96 %] 96 % (11/23 0519) Weight:  [62.097 kg (136 lb 14.4 oz)] 62.097 kg (136 lb 14.4 oz) (11/23 0519) Weight change:  Last BM Date: 12/14/12  Intake/Output from previous day: 11/22 0701 - 11/23 0700 In: 1454.2 [I.V.:407.2; TPN:1047] Out: 2675 [Urine:350; Emesis/NG output:2325] Intake/Output this shift: Total I/O In: 1454.2 [I.V.:407.2; TPN:1047] Out: 1725 [Urine:350; Emesis/NG output:1375]  Resp: clear to auscultation bilaterally Cardio: regular rate and rhythm, S1, S2 normal, no murmur, click, rub or gallop GI: distended,no bowel sounds mild tenderness  Lab Results:  Recent Labs  12/15/12 0500 12/16/12 0555  WBC 7.4 8.6  HGB 9.2* 9.3*  HCT 26.1* 26.9*  PLT 267 295   BMET  Recent Labs  12/15/12 0500 12/16/12 0555  NA 130* 126*  K 4.1 4.2  CL 98 96  CO2 25 22  GLUCOSE 130* 125*  BUN 22 21  CREATININE 0.44* 0.49*  CALCIUM 8.9 8.7    Studies/Results: Dg Abd Portable 1v  12/16/2012   CLINICAL DATA:  Abdominal distention.  EXAM: PORTABLE ABDOMEN - 1 VIEW  COMPARISON:  12/14/2012 barium enema.  FINDINGS: Continued gaseous distention of large and small bowel. The cecal distention has decreased since prior study. Small bowel distention is similar. NG tube remains present in the stomach. No supine evidence of free air. Prior cholecystectomy.  IMPRESSION: Stable small bowel dilatation.  Decreasing cecal distention.   Electronically Signed   By: Charlett Nose M.D.   On: 12/16/2012 20:26    Medications:  Scheduled: . acetaminophen  650 mg Rectal Q6H   Or  . acetaminophen  650 mg Oral Q6H  . enoxaparin (LOVENOX) injection  30 mg Subcutaneous Q24H  .  pantoprazole (PROTONIX) IV  40 mg Intravenous Q12H    Assessment/Plan: 1. Ileus, kub this am pending she is more comfortable but otherwise no improvement clinically appreciate surgical and GI help 2.hyponatremia all labs for tomorrow on tpn 3. htn fair control elevated systolic likely from dyscomfort   LOS: 12 days   [facility] 12/17/2012, 6:57 AM

## 2012-12-17 NOTE — Progress Notes (Signed)
GASTROENTEROLOGY PROGRESS NOTE  Problem:   Prolonged small bowel ileus status post partial colectomy 11 days ago. Possible partial obstruction at the anastomosis, which was somewhat inflamed and edematous at time of colonoscopy 2 days ago.  Subjective: Feels somewhat better than yesterday. Chief complaint seems to be back pain, and prolonged hospitalization rather than acute abdominal symptoms.  Objective: NG tube draining large amount of clear bile tinged fluid. High and she outputs, as noted. Abdomen remained slightly distended, and bowel sounds were quiet but no overt tenderness present.  Potassium remains normal, although slightly lower, at 3.9.  KUB from today shows slight improvement from yesterday's felt in terms of degree of small bowel ileus. I have personally reviewed the films. The cecal distention, which prompted the decompressive colonoscopy 2 days ago, has essentially resolved.  Assessment: Prolonged ileus, perhaps gradually resolving based on radiographic findings and the fact that the patient did have a small bowel movement today. Cannot exclude an element of low-grade obstruction at the anastomosis.  Plan: Continue supportive care. Agree with trial of metoclopramide to try to improve small bowel motility. If she does not continue to improve, could consider retrograde contrast exam (Gastrografin enema) if she remains symptomatic, to try to confirm the absence of any clinically significant obstruction at her anastomosis. Keep potassium level around 4, or slightly higher.  Florencia Reasons, M.D. 12/17/2012 12:28 PM

## 2012-12-17 NOTE — Progress Notes (Signed)
Notified Dr. Pete Glatter of pt. Unrelieved pain.  Increase frequency of fentanyl to Q3H.   Luz Brazen, RN

## 2012-12-17 NOTE — Progress Notes (Signed)
ATTENDING ADDENDUM:  I personally reviewed patient's record, examined the patient, and formulated the following assessment and plan:  Small BM this am but still has high NG output.  Agree with starting reglan, given no signs of obstruction.

## 2012-12-18 ENCOUNTER — Encounter (HOSPITAL_COMMUNITY): Payer: Self-pay | Admitting: Gastroenterology

## 2012-12-18 LAB — CBC
HCT: 25.1 % — ABNORMAL LOW (ref 36.0–46.0)
MCH: 30.7 pg (ref 26.0–34.0)
MCHC: 34.3 g/dL (ref 30.0–36.0)
MCV: 89.6 fL (ref 78.0–100.0)
Platelets: 302 10*3/uL (ref 150–400)
RDW: 16.7 % — ABNORMAL HIGH (ref 11.5–15.5)
WBC: 7.9 10*3/uL (ref 4.0–10.5)

## 2012-12-18 LAB — DIFFERENTIAL
Basophils Absolute: 0 10*3/uL (ref 0.0–0.1)
Eosinophils Absolute: 0.2 10*3/uL (ref 0.0–0.7)
Eosinophils Relative: 2 % (ref 0–5)
Lymphocytes Relative: 17 % (ref 12–46)
Monocytes Absolute: 1.1 10*3/uL — ABNORMAL HIGH (ref 0.1–1.0)
Neutro Abs: 5.3 10*3/uL (ref 1.7–7.7)

## 2012-12-18 LAB — COMPREHENSIVE METABOLIC PANEL
ALT: 37 U/L — ABNORMAL HIGH (ref 0–35)
AST: 40 U/L — ABNORMAL HIGH (ref 0–37)
Albumin: 2.5 g/dL — ABNORMAL LOW (ref 3.5–5.2)
Alkaline Phosphatase: 94 U/L (ref 39–117)
Calcium: 8.7 mg/dL (ref 8.4–10.5)
Creatinine, Ser: 0.52 mg/dL (ref 0.50–1.10)
Glucose, Bld: 115 mg/dL — ABNORMAL HIGH (ref 70–99)
Sodium: 126 mEq/L — ABNORMAL LOW (ref 135–145)
Total Protein: 5.4 g/dL — ABNORMAL LOW (ref 6.0–8.3)

## 2012-12-18 LAB — PREALBUMIN: Prealbumin: 17 mg/dL — ABNORMAL LOW (ref 17.0–34.0)

## 2012-12-18 LAB — MAGNESIUM: Magnesium: 2.4 mg/dL (ref 1.5–2.5)

## 2012-12-18 MED ORDER — TRACE MINERALS CR-CU-F-FE-I-MN-MO-SE-ZN IV SOLN
INTRAVENOUS | Status: AC
  Administered 2012-12-18: 17:00:00 via INTRAVENOUS
  Filled 2012-12-18: qty 2000

## 2012-12-18 MED ORDER — FAT EMULSION 20 % IV EMUL
250.0000 mL | INTRAVENOUS | Status: AC
  Administered 2012-12-18: 17:00:00 250 mL via INTRAVENOUS
  Filled 2012-12-18: qty 250

## 2012-12-18 NOTE — Progress Notes (Signed)
Subjective: Patient is feeling much better overall and wanted this morning.  She is now on Reglan.  She does have bowel sounds his morning.  Abdomen is minorly uncomfortable, but much improved over the past 48 hours  Objective: Weight change:   Intake/Output Summary (Last 24 hours) at 12/18/12 1018 Last data filed at 12/18/12 0934  Gross per 24 hour  Intake 3045.83 ml  Output   2450 ml  Net 595.83 ml   Filed Vitals:   12/17/12 1353 12/17/12 1442 12/17/12 2137 12/18/12 0455  BP: 198/73 168/76 124/62 150/79  Pulse: 121  102 105  Temp: 98.4 F (36.9 C)  97.9 F (36.6 C) 97.9 F (36.6 C)  TempSrc: Oral  Oral Oral  Resp: 18  18 18   Height:      Weight:      SpO2: 93%  96% 100%    General Appearance: Eyes closed, conversive, feels very weak, miserable  NG aspirate: Bile-colored, clear  Lungs: Clear to auscultation bilaterally, respirations unlabored  Heart: Regular rate and rhythm, S1 and S2 normal, no murmur, rub or gallop  Abdomen: Soft with tenderness diffusely. Bowel sounds are present, normal  Extremities: Extremities normal, atraumatic, no cyanosis or edema  Neuro: Nonfocal, lethargic   Lab Results: Results for orders placed during the hospital encounter of 12/05/12 (from the past 48 hour(s))  BASIC METABOLIC PANEL     Status: Abnormal   Collection Time    12/17/12  5:15 AM      Result Value Range   Sodium 128 (*) 135 - 145 mEq/L   Potassium 3.9  3.5 - 5.1 mEq/L   Chloride 96  96 - 112 mEq/L   CO2 24  19 - 32 mEq/L   Glucose, Bld 110 (*) 70 - 99 mg/dL   BUN 18  6 - 23 mg/dL   Creatinine, Ser 2.95 (*) 0.50 - 1.10 mg/dL   Calcium 8.8  8.4 - 28.4 mg/dL   GFR calc non Af Amer >90  >90 mL/min   GFR calc Af Amer >90  >90 mL/min   Comment: (NOTE)     The eGFR has been calculated using the CKD EPI equation.     This calculation has not been validated in all clinical situations.     eGFR's persistently <90 mL/min signify possible Chronic Kidney     Disease.   COMPREHENSIVE METABOLIC PANEL     Status: Abnormal   Collection Time    12/18/12  4:10 AM      Result Value Range   Sodium 126 (*) 135 - 145 mEq/L   Potassium 4.2  3.5 - 5.1 mEq/L   Chloride 95 (*) 96 - 112 mEq/L   CO2 23  19 - 32 mEq/L   Glucose, Bld 115 (*) 70 - 99 mg/dL   BUN 21  6 - 23 mg/dL   Creatinine, Ser 1.32  0.50 - 1.10 mg/dL   Calcium 8.7  8.4 - 44.0 mg/dL   Total Protein 5.4 (*) 6.0 - 8.3 g/dL   Albumin 2.5 (*) 3.5 - 5.2 g/dL   AST 40 (*) 0 - 37 U/L   ALT 37 (*) 0 - 35 U/L   Alkaline Phosphatase 94  39 - 117 U/L   Total Bilirubin 0.2 (*) 0.3 - 1.2 mg/dL   GFR calc non Af Amer 87 (*) >90 mL/min   GFR calc Af Amer >90  >90 mL/min   Comment: (NOTE)     The eGFR has been calculated using  the CKD EPI equation.     This calculation has not been validated in all clinical situations.     eGFR's persistently <90 mL/min signify possible Chronic Kidney     Disease.  MAGNESIUM     Status: None   Collection Time    12/18/12  4:10 AM      Result Value Range   Magnesium 2.4  1.5 - 2.5 mg/dL  PHOSPHORUS     Status: None   Collection Time    12/18/12  4:10 AM      Result Value Range   Phosphorus 3.5  2.3 - 4.6 mg/dL  CBC     Status: Abnormal   Collection Time    12/18/12  4:10 AM      Result Value Range   WBC 7.9  4.0 - 10.5 K/uL   RBC 2.80 (*) 3.87 - 5.11 MIL/uL   Hemoglobin 8.6 (*) 12.0 - 15.0 g/dL   HCT 16.1 (*) 09.6 - 04.5 %   MCV 89.6  78.0 - 100.0 fL   MCH 30.7  26.0 - 34.0 pg   MCHC 34.3  30.0 - 36.0 g/dL   RDW 40.9 (*) 81.1 - 91.4 %   Platelets 302  150 - 400 K/uL  DIFFERENTIAL     Status: Abnormal   Collection Time    12/18/12  4:10 AM      Result Value Range   Neutrophils Relative % 67  43 - 77 %   Neutro Abs 5.3  1.7 - 7.7 K/uL   Lymphocytes Relative 17  12 - 46 %   Lymphs Abs 1.3  0.7 - 4.0 K/uL   Monocytes Relative 14 (*) 3 - 12 %   Monocytes Absolute 1.1 (*) 0.1 - 1.0 K/uL   Eosinophils Relative 2  0 - 5 %   Eosinophils Absolute 0.2  0.0 - 0.7  K/uL   Basophils Relative 0  0 - 1 %   Basophils Absolute 0.0  0.0 - 0.1 K/uL  TRIGLYCERIDES     Status: None   Collection Time    12/18/12  4:10 AM      Result Value Range   Triglycerides 83  <150 mg/dL    Studies/Results: Dg Abd Portable 1v  12/17/2012   CLINICAL DATA:  Postoperative ileus and status post resection of transverse colonic carcinoma.  EXAM: PORTABLE ABDOMEN - 1 VIEW  COMPARISON:  None.  FINDINGS: A nasogastric tube shows stable positioning in the stomach. Small bowel ileus is stable to slightly improved with current maximal small bowel caliber of approximately 4 cm. On the prior film, maximal caliber is measured at approximately 4.8 cm. No gross signs of free air or pneumatosis.  IMPRESSION: Stable to slightly improved small bowel ileus.   Electronically Signed   By: Irish Lack M.D.   On: 12/17/2012 07:59   Dg Abd Portable 1v  12/16/2012   CLINICAL DATA:  Abdominal distention.  EXAM: PORTABLE ABDOMEN - 1 VIEW  COMPARISON:  12/14/2012 barium enema.  FINDINGS: Continued gaseous distention of large and small bowel. The cecal distention has decreased since prior study. Small bowel distention is similar. NG tube remains present in the stomach. No supine evidence of free air. Prior cholecystectomy.  IMPRESSION: Stable small bowel dilatation.  Decreasing cecal distention.   Electronically Signed   By: Charlett Nose M.D.   On: 12/16/2012 20:26   Medications: Scheduled Meds: . acetaminophen  650 mg Rectal Q6H   Or  . acetaminophen  650  mg Oral Q6H  . enoxaparin (LOVENOX) injection  30 mg Subcutaneous Q24H  . metoCLOPramide (REGLAN) injection  5 mg Intravenous TID PC & HS  . pantoprazole (PROTONIX) IV  40 mg Intravenous Q12H   Continuous Infusions: . 0.9 % NaCl with KCl 40 mEq / L 35 mL/hr at 12/17/12 2134  . Marland KitchenTPN (CLINIMIX-E) Adult 80 mL/hr at 12/17/12 2100   And  . fat emulsion 500 kcal (12/18/12 0700)  . Marland KitchenTPN (CLINIMIX-E) Adult     And  . fat emulsion     PRN  Meds:.bisacodyl, fentaNYL, hydrALAZINE, menthol-cetylpyridinium, methocarbamol (ROBAXIN) IV, midazolam, phenol, promethazine, sodium chloride  Assessment/Plan:  Principal Problem:  Abdominal pain - improved with decompression of cecum and bowel sounds are better.  Continue metoclopramide and Protonix.  Minimize pain medications as possible  Active Problems:  Pacemaker implanted 08/28/12 - functioning normally currently  Essential hypertension, benign - relatively well controlled with hydralazine, likely secondary to pain  Colonic mass - diagnostic of adenocarcinoma, resected  Continue incentive spirometry  Physical therapy as soon as she is able  FEN - continue TPN. Continue NG tube - as per surgery and GI. Hyponatremia - aware  Disposition: No change, current hospitalization and treatment appropriate for current level illness Anemia: Hemoglobin 8.6, continue to follow DO NOT RESUSCITATE    LOS: 13 days   Pearla Dubonnet, MD 12/18/2012, 10:18 AM

## 2012-12-18 NOTE — Progress Notes (Signed)
Although the patient has had a couple of BMs today, they probably wqere stimulated by suppositories.  Her abdomen is still distended and has hypoactive bowel sounds.  Not very tender, but tympanic.  Will recheck X-rays in the AM.  Marta Lamas. Gae Bon, MD, FACS [phone] [facility]

## 2012-12-18 NOTE — Progress Notes (Signed)
Physical Therapy Treatment Patient Details Name: Tamara [name] MRN: [id number] DOB: 1930/01/02 Today's Date: 12/18/2012 Time: 4098-1191 PT Time Calculation (min): 23 min  PT Assessment / Plan / Recommendation  History of Present Illness 79 y/o WF admitted with GI bleed and s/p partialcolectomy after sigmoidoscopy revealed mass consistent with malignancy.   PT Comments   Pt sitting up in recliner upon arrival.  Required less motivation to participate in therapy today.  Overall, she moves well as she was min guard assist for sit<>stand transfers & ambulation, just slow & fatigues quickly.   Pt states she did not ambulate over the weekend; Reiterated importance of increasing activity with Nsing & ambulate 2-3x's/day.      Follow Up Recommendations  SNF;Supervision/Assistance - 24 hour     Does the patient have the potential to tolerate intense rehabilitation     Barriers to Discharge        Equipment Recommendations  Rolling walker with 5" wheels    Recommendations for Other Services    Frequency Min 3X/week   Progress towards PT Goals Progress towards PT goals: Progressing toward goals  Plan Current plan remains appropriate    Precautions / Restrictions Precautions Precautions: Fall Restrictions Weight Bearing Restrictions: No   Pertinent Vitals/Pain "I hurt all over".  Did not specify.  Appeared comfortable with repositioning at end of session.     Mobility  Bed Mobility Bed Mobility: Not assessed Transfers Transfers: Sit to Stand;Stand to Sit Sit to Stand: 4: Min guard;With upper extremity assist;With armrests;From chair/3-in-1 Stand to Sit: 4: Min guard;With upper extremity assist;With armrests;To chair/3-in-1 Details for Transfer Assistance: Cues for hand placement.  performed 3x's.   Ambulation/Gait Ambulation/Gait Assistance: 4: Min guard Ambulation Distance (Feet): 100 Feet Assistive device: Rolling walker Ambulation/Gait Assistance Details:  Required cues for  safe manuvering around end of bed & chair.  Pt with slow cadence & small steps but steady.   Required 1x standing rest break due to fatigue.   Gait Pattern: Step-through pattern;Decreased stride length;Shuffle Gait velocity: very decreased Stairs: No Wheelchair Mobility Wheelchair Mobility: No    Exercises General Exercises - Lower Extremity Long Arc Quad: AROM;Strengthening;Both;10 reps Hip Flexion/Marching: AROM;Strengthening;Both;10 reps Toe Raises: AROM;Both;10 reps Heel Raises: AROM;Both;10 reps     PT Goals (current goals can now be found in the care plan section) Acute Rehab PT Goals PT Goal Formulation: With patient/family Time For Goal Achievement: 12/22/12 Potential to Achieve Goals: Good  Visit Information  Last PT Received On: 12/18/12 Assistance Needed: +1 History of Present Illness: 79 y/o WF admitted with GI bleed and s/p partialcolectomy after sigmoidoscopy revealed mass consistent with malignancy.    Subjective Data      Cognition  Cognition Arousal/Alertness: Awake/alert Behavior During Therapy: WFL for tasks assessed/performed Overall Cognitive Status: Within Functional Limits for tasks assessed    Balance     End of Session PT - End of Session Activity Tolerance: Patient tolerated treatment well Patient left: in chair;with call bell/phone within reach;with family/visitor present Nurse Communication: Mobility status   GP     Lara Mulch 12/18/2012, 8:45 AM  Verdell Face, PTA [phone] 12/18/2012

## 2012-12-18 NOTE — Clinical Social Work Note (Signed)
Per weekend CSW handoff report. Husband was updated on SNF options available to patient given her need for TPN. CSW will continue to follow for DC needs.   Roddie Mc, [place], [place], [phone]

## 2012-12-18 NOTE — Progress Notes (Signed)
Tamara [name] 9:36 AM  Subjective: Patient doing okay but with a little stomach pain today but is moving around adequately but not passing any gas from below not doing as well as this weekend according to patient and husband  Objective: Vital signs stable afebrile no acute distress NG output little decreased but questionable functioning of tube per nurse abdomen is distended more tender than Friday occasional bowel sound labs stable no new x-ray  Assessment: Postop ileus versus anastomotic edema which I was significantly concerned about on colonoscopy on Friday  Plan: Answered all of the husband's questions might consider trying erythromycin and set of Reglan if not allergic and continue to try to wean pain medicine and consider repeat decompression versus possibly even reanastomosis if she continues to not improve and repeat x-ray either later today or tomorrow per surgical team [facility]

## 2012-12-18 NOTE — Progress Notes (Signed)
PARENTERAL NUTRITION CONSULT NOTE - Follow Up  Pharmacy Consult:  TPN Indication: Prolonged post-op ileus  Allergies  Allergen Reactions  . Codeine Nausea And Vomiting  . Zofran [Ondansetron Hcl]    Patient Measurements: Height: 5\' 5"  (165.1 cm) Weight: 136 lb 14.4 oz (62.097 kg) IBW/kg (Calculated) : 57  Vital Signs: Temp: 97.9 F (36.6 C) (11/24 0455) Temp src: Oral (11/24 0455) BP: 150/79 mmHg (11/24 0455) Pulse Rate: 105 (11/24 0455)  Labs:  Recent Labs  12/16/12 0555 12/18/12 0410  WBC 8.6 7.9  HGB 9.3* 8.6*  HCT 26.9* 25.1*  PLT 295 302    Recent Labs  12/16/12 0555 12/17/12 0515 12/18/12 0410  NA 126* 128* 126*  K 4.2 3.9 4.2  CL 96 96 95*  CO2 22 24 23   GLUCOSE 125* 110* 115*  BUN 21 18 21   CREATININE 0.49* 0.42* 0.52  CALCIUM 8.7 8.8 8.7  MG  --   --  2.4  PHOS  --   --  3.5  PROT  --   --  5.4*  ALBUMIN  --   --  2.5*  AST  --   --  40*  ALT  --   --  37*  ALKPHOS  --   --  94  BILITOT  --   --  0.2*  TRIG  --   --  83   Estimated Creatinine Clearance: 49.6 ml/min (by C-G formula based on Cr of 0.52).   Recent Labs  12/16/12 0026 12/16/12 0556 12/16/12 0641  GLUCAP 128* 127* 122*    Insulin Requirements in the past 24 hours:  None - SSI d/c'ed  Assessment:  38 YOF s/p lap-assisted transverse colectomy on 12/07/12 for partially obstruction transverse colon mass. She continues on TPN for post-op ileus, which seems to be improving per MD.  GI: intermittent nausea and severe abd pain/discomfort, s/p decompression for cecal dilatation 11/21.  NG O/P down to 1400 mL. TG WNL, prealbumin normal (17.5).  Small BM 11/23. Reglan added. Endo: no hx DM - CBGs well controlled at goal TPN rate, SSI d/c'ed 11/22 Lytes: hyponatremia continues (Na+ 126), K+ 4.2 (goal ~4 for ileus), others WNL Renal: SCr stable, CrCL ~50 ml/min, I/O's inaccurate, NS with KCL at 35 ml/hr (provides ~63mEq KCL daily) Pulm: RA Cards: hx HTN - BP elevated, HR  trending up (lisinopril PTA) - PRN hydralazine for SBP > 180 Hepatobil: AST/ALT slightly elevated, others wnl. Heme/Onc: hx breast cancer - hgb 8.6, plts WNL (stable), FOB+ on 11/10 and 11/17 Neuro: A&O - scheduled APAP, not optimistic about her condition improving ID: afeb, WBC WNL, C.diff PCR negative - not on abx Best Practices: Lovenox, SCDs TPN Access: PICC placed 12/11/12 TPN day#: 6 (11/18 >> )  Current Nutrition:  Clinimix E 5/15 at 80 ml/hr + IVFE at 10 ml/hr providing 1843 kCal and 96 gm protein daily  Estimated Nutritional Needs:  1700 - 1950 kCal; 85-95 g protein daily  Plan:  - Continue Clinimix E 5/15 at 80 ml/hr + IVFE at 10 ml/hr meeting 100% of needs - Daily multivitamin and trace elements - F/u NGT o/p, return of bowel function, and labs  Christoper Fabian, PharmD, BCPS Clinical pharmacist, pager [phone] 12/18/2012, 7:56 AM

## 2012-12-18 NOTE — Progress Notes (Signed)
3 Days Post-Op  Subjective: Pt sitting up to [facility] having a BM.  Nausea has resolved.  She is feeling much better today  of bilious output yesterday.  over the last 12 hours.    Objective: Vital signs in last 24 hours: Temp:  [97.9 F (36.6 C)-98.4 F (36.9 C)] 97.9 F (36.6 C) (11/24 0455) Pulse Rate:  [102-121] 105 (11/24 0455) Resp:  [18] 18 (11/24 0455) BP: (124-198)/(62-79) 150/79 mmHg (11/24 0455) SpO2:  [93 %-100 %] 100 % (11/24 0455) Last BM Date: 12/17/12  Intake/Output from previous day: 11/23 0701 - 11/24 0700 In: 3045.8 [I.V.:852.8; ZOX:0960] Out: 2250 [Urine:850; Emesis/NG output:1400] Intake/Output this shift: Total I/O In: -  Out: 550 [Urine:200; Emesis/NG output:350]  Physical Exam:  General: Pt awake/alert/oriented x3 in no acute distress  Chest: CTA No chest wall pain w good excursion  CV: Pulses intact. Regular rhythm  MS: Normal AROM mjr joints. No obvious deformity  Abdomen: Soft, distended, mild tenderness, +BS, no HSM, incisions C/D/I, steri-strips Ext: SCDs BLE. No mjr edema. No cyanosis  Skin: No petechiae / purpura    Lab Results:   Recent Labs  12/16/12 0555 12/18/12 0410  WBC 8.6 7.9  HGB 9.3* 8.6*  HCT 26.9* 25.1*  PLT 295 302   BMET  Recent Labs  12/17/12 0515 12/18/12 0410  NA 128* 126*  K 3.9 4.2  CL 96 95*  CO2 24 23  GLUCOSE 110* 115*  BUN 18 21  CREATININE 0.42* 0.52  CALCIUM 8.8 8.7   PT/INR No results found for this basename: LABPROT, INR,  in the last 72 hours ABG No results found for this basename: PHART, PCO2, PO2, HCO3,  in the last 72 hours  Studies/Results: Dg Abd Portable 1v  12/17/2012   CLINICAL DATA:  Postoperative ileus and status post resection of transverse colonic carcinoma.  EXAM: PORTABLE ABDOMEN - 1 VIEW  COMPARISON:  None.  FINDINGS: A nasogastric tube shows stable positioning in the stomach. Small bowel ileus is stable to slightly improved with current maximal small bowel caliber of  approximately 4 cm. On the prior film, maximal caliber is measured at approximately 4.8 cm. No gross signs of free air or pneumatosis.  IMPRESSION: Stable to slightly improved small bowel ileus.   Electronically Signed   By: Irish Lack M.D.   On: 12/17/2012 07:59   Dg Abd Portable 1v  12/16/2012   CLINICAL DATA:  Abdominal distention.  EXAM: PORTABLE ABDOMEN - 1 VIEW  COMPARISON:  12/14/2012 barium enema.  FINDINGS: Continued gaseous distention of large and small bowel. The cecal distention has decreased since prior study. Small bowel distention is similar. NG tube remains present in the stomach. No supine evidence of free air. Prior cholecystectomy.  IMPRESSION: Stable small bowel dilatation.  Decreasing cecal distention.   Electronically Signed   By: Charlett Nose M.D.   On: 12/16/2012 20:26    Anti-infectives: Anti-infectives   Start     Dose/Rate Route Frequency Ordered Stop   12/07/12 0600  cefoTEtan (CEFOTAN) 2 g in dextrose 5 % 50 mL IVPB     2 g 100 mL/hr over 30 Minutes Intravenous On call to O.R. 12/06/12 1420 12/07/12 1219   12/05/12 0600  metroNIDAZOLE (FLAGYL) IVPB 500 mg  Status:  Discontinued     500 mg 100 mL/hr over 60 Minutes Intravenous Every 8 hours 12/05/12 0525 12/07/12 0528      Assessment/Plan: Partially obstructing colon mass found to be Invasive AdenoCA, neg lymph  node involvement, S/P lap assisted transverse, splenic flexure colectomy POD #10  Post-op ileus  PCM on TNA  -s/p decompressive colonoscopy on 11/21(1L of fluid out).  -feels much better today.  Had a BM this morning.  No nausea.  of bilious output over 12 hours.  Clamp NGT. -we will advance diet very slowly -agree with minimizing pain meds -continue with reglan -DVT proph: ambulation, SCD's, lovenox  -encourage mobility.  -IS   LOS: 13 days   Bonner Puna [facility]  Pager [phone]  Office [phone]  12/18/2012 11:17 AM

## 2012-12-19 ENCOUNTER — Inpatient Hospital Stay (HOSPITAL_COMMUNITY): Payer: Medicare Other

## 2012-12-19 MED ORDER — FAT EMULSION 20 % IV EMUL
250.0000 mL | INTRAVENOUS | Status: AC
  Administered 2012-12-19: 250 mL via INTRAVENOUS
  Filled 2012-12-19: qty 250

## 2012-12-19 MED ORDER — TRACE MINERALS CR-CU-F-FE-I-MN-MO-SE-ZN IV SOLN
INTRAVENOUS | Status: AC
  Administered 2012-12-19: 17:00:00 via INTRAVENOUS
  Filled 2012-12-19: qty 2000

## 2012-12-19 NOTE — Progress Notes (Signed)
PT Cancellation Note  Patient Details Name: Tamara [name] MRN: [id number] DOB: Oct 16, 1929   Cancelled Treatment:    Reason Eval/Treat Not Completed: Other (comment) Pt given the bedpan. She states she does not want to get out of bed today.   Ebony Hail [facility] 12/19/2012, 11:42 AM

## 2012-12-19 NOTE — Progress Notes (Signed)
Subjective: Tamara [name] was doing quite well yesterday afternoon but after clipping her NG tube for 4-5 hours she experienced severe abdominal pain and nausea and tube has been reconnected to suction.  She is feeling somewhat better in terms of the nausea but still having fairly intense abdominal pain and distention.  She does not want to go back to surgery at this time.  Objective: Weight change:   Intake/Output Summary (Last 24 hours) at 12/19/12 0819 Last data filed at 12/19/12 0755  Gross per 24 hour  Intake   1375 ml  Output   2650 ml  Net  -1275 ml   Filed Vitals:   12/18/12 0455 12/18/12 1417 12/18/12 2337 12/19/12 0748  BP: 150/79 131/71 159/80 173/77  Pulse: 105 98 95 89  Temp: 97.9 F (36.6 C) 98.2 F (36.8 C) 98.2 F (36.8 C) 98.1 F (36.7 C)  TempSrc: Oral Oral Oral Oral  Resp: 18 18 18 18   Height:      Weight:      SpO2: 100% 96% 97% 98%    General Appearance: Eyes closed, conversive, feels very weak, miserable  NG aspirate: Bile-colored, clear  Lungs: Clear to auscultation bilaterally, respirations unlabored  Heart: Regular rate and rhythm, S1 and S2 normal, no murmur, rub or gallop  Abdomen: Distended, no bowel sounds, tender  Extremities: Extremities normal, atraumatic, no cyanosis or edema  Neuro: Nonfocal, lethargic  Lab Results: Results for orders placed during the hospital encounter of 12/05/12 (from the past 48 hour(s))  COMPREHENSIVE METABOLIC PANEL     Status: Abnormal   Collection Time    12/18/12  4:10 AM      Result Value Range   Sodium 126 (*) 135 - 145 mEq/L   Potassium 4.2  3.5 - 5.1 mEq/L   Chloride 95 (*) 96 - 112 mEq/L   CO2 23  19 - 32 mEq/L   Glucose, Bld 115 (*) 70 - 99 mg/dL   BUN 21  6 - 23 mg/dL   Creatinine, Ser 1.61  0.50 - 1.10 mg/dL   Calcium 8.7  8.4 - 09.6 mg/dL   Total Protein 5.4 (*) 6.0 - 8.3 g/dL   Albumin 2.5 (*) 3.5 - 5.2 g/dL   AST 40 (*) 0 - 37 U/L   ALT 37 (*) 0 - 35 U/L   Alkaline Phosphatase 94  39 - 117  U/L   Total Bilirubin 0.2 (*) 0.3 - 1.2 mg/dL   GFR calc non Af Amer 87 (*) >90 mL/min   GFR calc Af Amer >90  >90 mL/min   Comment: (NOTE)     The eGFR has been calculated using the CKD EPI equation.     This calculation has not been validated in all clinical situations.     eGFR's persistently <90 mL/min signify possible Chronic Kidney     Disease.  MAGNESIUM     Status: None   Collection Time    12/18/12  4:10 AM      Result Value Range   Magnesium 2.4  1.5 - 2.5 mg/dL  PHOSPHORUS     Status: None   Collection Time    12/18/12  4:10 AM      Result Value Range   Phosphorus 3.5  2.3 - 4.6 mg/dL  CBC     Status: Abnormal   Collection Time    12/18/12  4:10 AM      Result Value Range   WBC 7.9  4.0 - 10.5 K/uL  RBC 2.80 (*) 3.87 - 5.11 MIL/uL   Hemoglobin 8.6 (*) 12.0 - 15.0 g/dL   HCT 16.1 (*) 09.6 - 04.5 %   MCV 89.6  78.0 - 100.0 fL   MCH 30.7  26.0 - 34.0 pg   MCHC 34.3  30.0 - 36.0 g/dL   RDW 40.9 (*) 81.1 - 91.4 %   Platelets 302  150 - 400 K/uL  DIFFERENTIAL     Status: Abnormal   Collection Time    12/18/12  4:10 AM      Result Value Range   Neutrophils Relative % 67  43 - 77 %   Neutro Abs 5.3  1.7 - 7.7 K/uL   Lymphocytes Relative 17  12 - 46 %   Lymphs Abs 1.3  0.7 - 4.0 K/uL   Monocytes Relative 14 (*) 3 - 12 %   Monocytes Absolute 1.1 (*) 0.1 - 1.0 K/uL   Eosinophils Relative 2  0 - 5 %   Eosinophils Absolute 0.2  0.0 - 0.7 K/uL   Basophils Relative 0  0 - 1 %   Basophils Absolute 0.0  0.0 - 0.1 K/uL  PREALBUMIN     Status: Abnormal   Collection Time    12/18/12  4:10 AM      Result Value Range   Prealbumin 17.0 (*) 17.0 - 34.0 mg/dL   Comment: Performed at [facility]  TRIGLYCERIDES     Status: None   Collection Time    12/18/12  4:10 AM      Result Value Range   Triglycerides 83  <150 mg/dL    Studies/Results: No results found. Medications: Scheduled Meds: . acetaminophen  650 mg Rectal Q6H   Or  . acetaminophen  650 mg Oral Q6H   . enoxaparin (LOVENOX) injection  30 mg Subcutaneous Q24H  . metoCLOPramide (REGLAN) injection  5 mg Intravenous TID PC & HS  . pantoprazole (PROTONIX) IV  40 mg Intravenous Q12H   Continuous Infusions: . 0.9 % NaCl with KCl 40 mEq / L 35 mL/hr at 12/19/12 0313  . Marland KitchenTPN (CLINIMIX-E) Adult 80 mL/hr at 12/18/12 1706   And  . fat emulsion 250 mL (12/18/12 1707)  . Marland KitchenTPN (CLINIMIX-E) Adult     And  . fat emulsion     PRN Meds:.bisacodyl, fentaNYL, hydrALAZINE, menthol-cetylpyridinium, methocarbamol (ROBAXIN) IV, midazolam, phenol, promethazine, sodium chloride  Assessment/Plan:  Principal Problem:  Abdominal pain -  unfortunately, she appears to have a recurrent ileus.  We'll continue nasogastric suction and comfort medication Active Problems:  Pacemaker implanted 08/28/12 - functioning normally currently  Essential hypertension, benign - relatively well controlled with hydralazine, likely secondary to pain  Colonic mass - diagnostic of adenocarcinoma, resected  Continue incentive spirometry  Physical therapy as soon as she is able  FEN - continue TPN. Continue NG tube - as per surgery and GI.  Hyponatremia - aware  Disposition: Unfortunately, clinically still has an ileus.  We'll check abdominal x-ray.  She is currently declining the possibility of recurrent surgery  Anemia: Hemoglobin 8.6, continue to follow  DO NOT RESUSCITATE   LOS: 14 days   Pearla Dubonnet, MD 12/19/2012, 8:19 AM

## 2012-12-19 NOTE — Progress Notes (Signed)
Very sad that the patient wants to give up.  Her abdomen is quiet, good amount of NGT output.  No BM.  Still has a significant postoperative ileus.  I spoke with the patient's husband who is torn by the patient's attitude.  I told him in no uncertain terms that the patient is totally salvageable.  Tamara [name]. Gae Bon, MD, FACS [phone] [facility] Surgery

## 2012-12-19 NOTE — Progress Notes (Signed)
PARENTERAL NUTRITION CONSULT NOTE - Follow Up  Pharmacy Consult:  TPN Indication: Prolonged post-op ileus  Allergies  Allergen Reactions  . Codeine Nausea And Vomiting  . Zofran [Ondansetron Hcl]    Patient Measurements: Height: 5\' 5"  (165.1 cm) Weight: 136 lb 14.4 oz (62.097 kg) IBW/kg (Calculated) : 57  Vital Signs: Temp: 98.1 F (36.7 C) (11/25 0748) Temp src: Oral (11/25 0748) BP: 173/77 mmHg (11/25 0748) Pulse Rate: 89 (11/25 0748)  Labs:  Recent Labs  12/18/12 0410  WBC 7.9  HGB 8.6*  HCT 25.1*  PLT 302    Recent Labs  12/17/12 0515 12/18/12 0410  NA 128* 126*  K 3.9 4.2  CL 96 95*  CO2 24 23  GLUCOSE 110* 115*  BUN 18 21  CREATININE 0.42* 0.52  CALCIUM 8.8 8.7  MG  --  2.4  PHOS  --  3.5  PROT  --  5.4*  ALBUMIN  --  2.5*  AST  --  40*  ALT  --  37*  ALKPHOS  --  94  BILITOT  --  0.2*  PREALBUMIN  --  17.0*  TRIG  --  83   Estimated Creatinine Clearance: 49.6 ml/min (by C-G formula based on Cr of 0.52).  No results found for this basename: GLUCAP,  in the last 72 hours  Insulin Requirements in the past 24 hours:  None - SSI d/c'ed  Assessment:  Tamara [name] s/p lap-assisted transverse colectomy on 12/07/12 for partially obstruction transverse colon mass. She continues on TPN for post-op ileus.  GI: intermittent nausea and severe abd pain/discomfort, s/p decompression for cecal dilatation 11/21.  NG O/P down to 1000 mL. NGT clamped currently. Abd remains distended and hypoactive BS. Plans to advance diet very slowly.TG WNL, prealbumin remains stable at 17.  Small BM 11/23 and 11/24. On Reglan. Endo: no hx DM - CBGs well controlled at goal TPN rate. SSI d/c'ed 11/22 Lytes: 11/24 hyponatremia continues (Na+ 126), K+ 4.2 (goal ~4 for ileus), others WNL Renal: SCr stable, CrCL ~50 ml/min, I/O's inaccurate, NS with KCL at 35 ml/hr (provides ~64mEq KCL daily) Pulm: RA Cards: hx HTN - BP elevated, HR 80s (lisinopril PTA) - PRN hydralazine for SBP >  180 Hepatobil: AST/ALT slightly elevated, others wnl. Heme/Onc: hx breast cancer - hgb 8.6, plts WNL (stable), FOB+ on 11/10 and 11/17 Neuro: A&O - scheduled APAP, not optimistic about her condition improving ID: afeb. WBC WNL. C.diff PCR negative - not on abx Best Practices: Lovenox, SCDs TPN Access: PICC placed 12/11/12 TPN day#: 7 (11/18 >> )  Current Nutrition:  Clinimix E 5/15 at 80 ml/hr + IVFE at 10 ml/hr providing 1843 kCal and 96 gm protein daily  Estimated Nutritional Needs:  1700 - 1950 kCal; 85-95 g protein daily  Plan:  - Continue Clinimix E 5/15 at 80 ml/hr + IVFE at 10 ml/hr meeting 100% of needs - Daily multivitamin and trace elements - F/u ability to begin diet  Christoper Fabian, PharmD, BCPS Clinical pharmacist, pager [phone] 12/19/2012, 7:54 AM

## 2012-12-19 NOTE — Progress Notes (Signed)
Patient ID: Tamara [name], female   DOB: 1929/11/23, 79 y.o.   MRN: [id number]   Subjective: She did well yesterday until about 5PM at which time she developed nausea.  NGT placed back to suction.  She feels about the same today.  She had 2 loose BMs yesterday.  Last suppository given about 4 days ago. She does not want to do anything today.  She is refusing all tests, refusing to get OOB. No flatus.    Objective:  Vital signs:  Filed Vitals:   12/18/12 0455 12/18/12 1417 12/18/12 2337 12/19/12 0748  BP: 150/79 131/71 159/80 173/77  Pulse: 105 98 95 89  Temp: 97.9 F (36.6 C) 98.2 F (36.8 C) 98.2 F (36.8 C) 98.1 F (36.7 C)  TempSrc: Oral Oral Oral Oral  Resp: 18 18 18 18   Height:      Weight:      SpO2: 100% 96% 97% 98%    Last BM Date: 12/18/12  Intake/Output   Yesterday:  11/24 0701 - 11/25 0700 In: 1375 [I.V.:385; TPN:990] Out: 2250 [Urine:1250; Emesis/NG output:1000] This shift:  Total I/O In: -  Out: 550 [Urine:550]  Physical Exam:  General: Pt awake/alert/oriented x3 in no acute distress  Abdomen: distended, mild tenderness, +BS, no HSM, incisions C/D/I, steri-strips  Ext: SCDs BLE. No mjr edema. No cyanosis  Skin: No petechiae / purpura   Problem List:   Principal Problem:   GI bleed Active Problems:   Pacemaker implanted 08/28/12   Essential hypertension, benign   D (diarrhea)   Colonic mass   Gastric bleed   Ileus, postoperative    Results:   Labs: Results for orders placed during the hospital encounter of 12/05/12 (from the past 48 hour(s))  COMPREHENSIVE METABOLIC PANEL     Status: Abnormal   Collection Time    12/18/12  4:10 AM      Result Value Range   Sodium 126 (*) 135 - 145 mEq/L   Potassium 4.2  3.5 - 5.1 mEq/L   Chloride 95 (*) 96 - 112 mEq/L   CO2 23  19 - 32 mEq/L   Glucose, Bld 115 (*) 70 - 99 mg/dL   BUN 21  6 - 23 mg/dL   Creatinine, Ser 4.09  0.50 - 1.10 mg/dL   Calcium 8.7  8.4 - 81.1 mg/dL   Total Protein 5.4 (*)  6.0 - 8.3 g/dL   Albumin 2.5 (*) 3.5 - 5.2 g/dL   AST 40 (*) 0 - 37 U/L   ALT 37 (*) 0 - 35 U/L   Alkaline Phosphatase 94  39 - 117 U/L   Total Bilirubin 0.2 (*) 0.3 - 1.2 mg/dL   GFR calc non Af Amer 87 (*) >90 mL/min   GFR calc Af Amer >90  >90 mL/min   Comment: (NOTE)     The eGFR has been calculated using the CKD EPI equation.     This calculation has not been validated in all clinical situations.     eGFR's persistently <90 mL/min signify possible Chronic Kidney     Disease.  MAGNESIUM     Status: None   Collection Time    12/18/12  4:10 AM      Result Value Range   Magnesium 2.4  1.5 - 2.5 mg/dL  PHOSPHORUS     Status: None   Collection Time    12/18/12  4:10 AM      Result Value Range   Phosphorus 3.5  2.3 - 4.6  mg/dL  CBC     Status: Abnormal   Collection Time    12/18/12  4:10 AM      Result Value Range   WBC 7.9  4.0 - 10.5 K/uL   RBC 2.80 (*) 3.87 - 5.11 MIL/uL   Hemoglobin 8.6 (*) 12.0 - 15.0 g/dL   HCT 16.1 (*) 09.6 - 04.5 %   MCV 89.6  78.0 - 100.0 fL   MCH 30.7  26.0 - 34.0 pg   MCHC 34.3  30.0 - 36.0 g/dL   RDW 40.9 (*) 81.1 - 91.4 %   Platelets 302  150 - 400 K/uL  DIFFERENTIAL     Status: Abnormal   Collection Time    12/18/12  4:10 AM      Result Value Range   Neutrophils Relative % 67  43 - 77 %   Neutro Abs 5.3  1.7 - 7.7 K/uL   Lymphocytes Relative 17  12 - 46 %   Lymphs Abs 1.3  0.7 - 4.0 K/uL   Monocytes Relative 14 (*) 3 - 12 %   Monocytes Absolute 1.1 (*) 0.1 - 1.0 K/uL   Eosinophils Relative 2  0 - 5 %   Eosinophils Absolute 0.2  0.0 - 0.7 K/uL   Basophils Relative 0  0 - 1 %   Basophils Absolute 0.0  0.0 - 0.1 K/uL  PREALBUMIN     Status: Abnormal   Collection Time    12/18/12  4:10 AM      Result Value Range   Prealbumin 17.0 (*) 17.0 - 34.0 mg/dL   Comment: Performed at [facility]  TRIGLYCERIDES     Status: None   Collection Time    12/18/12  4:10 AM      Result Value Range   Triglycerides 83  <150 mg/dL    Imaging  / Studies: No results found.  Medications / Allergies: per chart  Antibiotics: Anti-infectives   Start     Dose/Rate Route Frequency Ordered Stop   12/07/12 0600  cefoTEtan (CEFOTAN) 2 g in dextrose 5 % 50 mL IVPB     2 g 100 mL/hr over 30 Minutes Intravenous On call to O.R. 12/06/12 1420 12/07/12 1219   12/05/12 0600  metroNIDAZOLE (FLAGYL) IVPB 500 mg  Status:  Discontinued     500 mg 100 mL/hr over 60 Minutes Intravenous Every 8 hours 12/05/12 0525 12/07/12 0528        Assessment/Plan:  Partially obstructing colon mass found to be Invasive AdenoCA, neg lymph node involvement, S/P lap assisted transverse, splenic flexure colectomy POD #11 Post-op ileus  PCM on TNA  -s/p decompressive colonoscopy on 11/21(1L of fluid out).  -she is worse today. bilious output.  Refusing repeat XR -continue NGT to low intermittent suction -agree with minimizing pain meds  -continue with reglan  -encourage mobility.  -IS  Ashok Norris, [facility] Surgery Pager [phone] Office [phone]  12/19/2012 9:13 AM

## 2012-12-19 NOTE — Progress Notes (Signed)
Tamara [name] 11:05 AM  Subjective: Patient much better now with NG tube reconnected and resting comfortably but had a bad night  Objective: Vital signs stable afebrile no acute distress labs stable x-ray appears worse to me official report pending abdomen is slightly distended occasional bowel sounds softer than yesterday however and nontender today  Assessment: Postop ileus versus anastomosis edema  Plan: Agree with Dr. Simmie Davies note consider a trial of erythromycin next and will check on tomorrow  [facility] E

## 2012-12-20 LAB — BASIC METABOLIC PANEL
BUN: 18 mg/dL (ref 6–23)
CO2: 24 mEq/L (ref 19–32)
GFR calc Af Amer: >90 mL/min (ref >90)
GFR calc non Af Amer: 90 mL/min — ABNORMAL LOW (ref >90)
Glucose, Bld: 113 mg/dL — ABNORMAL HIGH (ref 70–99)
Potassium: 4.5 mEq/L (ref 3.5–5.1)

## 2012-12-20 MED ORDER — TRACE MINERALS CR-CU-F-FE-I-MN-MO-SE-ZN IV SOLN
INTRAVENOUS | Status: AC
  Administered 2012-12-20: 18:00:00 via INTRAVENOUS
  Filled 2012-12-20: qty 2000

## 2012-12-20 MED ORDER — FAT EMULSION 20 % IV EMUL
250.0000 mL | INTRAVENOUS | Status: AC
  Administered 2012-12-20: 18:00:00 250 mL via INTRAVENOUS
  Filled 2012-12-20: qty 250

## 2012-12-20 NOTE — Clinical Social Work Note (Signed)
CSW met with patient's husband to determine if patient and husband have decided on SNF bed. Husband says that he hasn't because he doesn't know when patient is leaving hospital. CSW explained that patient and husband will need to make decision on bed by time of discharge. CSW reiterated that patient's options remain limited if she remains on TPN. Husband insists that patient will be eating regularly prior to being discharged and states that he wants patient's information refaxed to determine what facilities will accept patient IF TPN is being discharged. CSW explained to patient that if patient remains on TPN after DC that he will need to make decision from SNFs that can offer TPN.   Roddie Mc, [place], [place], [phone]

## 2012-12-20 NOTE — Progress Notes (Signed)
[facility] Progress Note 5 Days Post-Op  Subjective: Patient feeling much better today than yesterday.  Having loose bowel movements.  Objective: Vital signs in last 24 hours: Temp:  [97.4 F (36.3 C)-98.2 F (36.8 C)] 98.1 F (36.7 C) (11/26 0533) Pulse Rate:  [62-101] 62 (11/26 0533) Resp:  [16-18] 16 (11/26 0533) BP: (148-159)/(53-83) 157/64 mmHg (11/26 0533) SpO2:  [97 %-99 %] 99 % (11/26 0533) Last BM Date: 12/19/12  Intake/Output from previous day: 11/25 0701 - 11/26 0700 In: 2554.5 [I.V.:420; TPN:2134.5] Out: 3325 [Urine:2625; Emesis/NG output:700] Intake/Output this shift: Total I/O In: -  Out: 600 [Urine:600]  General: Up on commode having lots of diarrhea.  Some flatus.  Lungs: Clear  Abd: Much softer, even in the sitting position.  Some bowel sounds.  Extremities: No changes  Neuro: Intact  Lab Results:  @LABLAST2 (wbc:2,hgb:2,hct:2,plt:2) BMET  Recent Labs  12/18/12 0410 12/20/12 0510  NA 126* 131*  K 4.2 4.5  CL 95* 99  CO2 23 24  GLUCOSE 115* 113*  BUN 21 18  CREATININE 0.52 0.48*  CALCIUM 8.7 9.1   PT/INR No results found for this basename: LABPROT, INR,  in the last 72 hours ABG No results found for this basename: PHART, PCO2, PO2, HCO3,  in the last 72 hours  Studies/Results: Dg Abd Portable 1v  12/19/2012   CLINICAL DATA:  Abdominal distention. Subsequent encounter for ileus.  EXAM: PORTABLE ABDOMEN - 1 VIEW  COMPARISON:  12/17/2012 dating back to 12/13/2012.  FINDINGS: Persistent moderate gaseous distention of multiple loops of small bowel throughout the abdomen and persistent moderate gaseous distention of the ascending and transverse colon. Maximum cecal diameter of approximately 13 cm (uncorrected for magnification), not significantly changed since yesterday. No suggestion of free air on the supine image. Interval removal of midline surgical skin staples. Nasogastric tube tip projects over the expected location of the mid body of  stomach. Surgical clips in the right upper quadrant from prior cholecystectomy. Degenerative changes involving the lumbar spine and the right hip.  IMPRESSION: 1. Stable generalized ileus. Maximum cecal diameter approximately 13 cm (uncorrected for magnification, approximately 10 cm corrected). 2. No new abnormalities.   Electronically Signed   By: Hulan Saas M.D.   On: 12/19/2012 08:49    Anti-infectives: Anti-infectives   Start     Dose/Rate Route Frequency Ordered Stop   12/07/12 0600  cefoTEtan (CEFOTAN) 2 g in dextrose 5 % 50 mL IVPB     2 g 100 mL/hr over 30 Minutes Intravenous On call to O.R. 12/06/12 1420 12/07/12 1219   12/05/12 0600  metroNIDAZOLE (FLAGYL) IVPB 500 mg  Status:  Discontinued     500 mg 100 mL/hr over 60 Minutes Intravenous Every 8 hours 12/05/12 0525 12/07/12 0528      Assessment/Plan: s/p Procedure(s): COLONOSCOPY BOWEL DECOMPRESSION Clamp NGT again. Check stool for C.diff. If not done already.  LOS: 15 days   Marta Lamas. Gae Bon, MD, FACS [phone] [facility] Surgery 12/20/2012

## 2012-12-20 NOTE — Progress Notes (Signed)
Physical Therapy Treatment Patient Details Name: Tamara [name] MRN: [id number] DOB: [birth year] Today's Date: 12/20/2012 Time: 7829-5621 PT Time Calculation (min): 39 min  PT Assessment / Plan / Recommendation  History of Present Illness 79 y/o WF admitted with GI bleed and s/p partialcolectomy after sigmoidoscopy revealed mass consistent with malignancy.   PT Comments   Pt limited by having loose stools today but very eager to participate.  Did not ambulate far today due to bowel issues.  Pt SOB with standing marching in place exercises. Anticipate slow progression with rehab.    Follow Up Recommendations  SNF;Supervision/Assistance - 24 hour     Does the patient have the potential to tolerate intense rehabilitation     Barriers to Discharge        Equipment Recommendations  Rolling walker with 5" wheels    Recommendations for Other Services    Frequency Min 3X/week   Progress towards PT Goals Progress towards PT goals: Progressing toward goals (limited by loose stools)  Plan Current plan remains appropriate    Precautions / Restrictions Precautions Precautions: Fall Restrictions Weight Bearing Restrictions: No   Pertinent Vitals/Pain P/t co abdominal discomfort, but did not rate.      Mobility  Bed Mobility Bed Mobility: Not assessed (pt up in chair) Transfers Transfers: Sit to Stand;Stand to Sit Sit to Stand: 4: Min guard;With upper extremity assist;With armrests;From chair/3-in-1 Stand to Sit: 4: Min guard;With upper extremity assist;With armrests;To chair/3-in-1 Details for Transfer Assistance: Cues for hand placement when going sit-stand.  performed 4x's for strengthening   Ambulation/Gait Ambulation/Gait Assistance: 4: Min guard Ambulation Distance (Feet): 3 Feet Assistive device: Rolling walker Gait Pattern: Decreased stride length;Shuffle;Step-to pattern Gait velocity: very decreased General Gait Details: Only ambulated very short distance to [facility] as pt  needed to have BM urgently Stairs: No Wheelchair Mobility Wheelchair Mobility: No    Exercises General Exercises - Lower Extremity Ankle Circles/Pumps: AROM;Both;10 reps;Seated Long Arc Quad: AROM;Strengthening;Both;10 reps Hip ABduction/ADduction: AROM;Both;10 reps;Supine (in recliner) Hip Flexion/Marching: AROM;Strengthening;Both;10 reps;Seated;Standing (Peformed for 30 sec sitting. 45 and 30 sec standing )   PT Diagnosis:    PT Problem List:   PT Treatment Interventions:     PT Goals (current goals can now be found in the care plan section) Acute Rehab PT Goals PT Goal Formulation: With patient/family Time For Goal Achievement: 12/22/12 Potential to Achieve Goals: Good  Visit Information  Last PT Received On: 12/20/12 Assistance Needed: +1 History of Present Illness: 79 y/o WF admitted with GI bleed and s/p partialcolectomy after sigmoidoscopy revealed mass consistent with malignancy.    Subjective Data  Subjective: pt very motivated to work w PT today   Cognition  Cognition Arousal/Alertness: Awake/alert Behavior During Therapy: WFL for tasks assessed/performed Overall Cognitive Status: Within Functional Limits for tasks assessed    Balance     End of Session PT - End of Session Equipment Utilized During Treatment: Gait belt Activity Tolerance: Other (comment) (limited by loose stools. Testing for C-diff today) Patient left: in chair;with call bell/phone within reach Nurse Communication: Mobility status   GP     Donnella Sham 12/20/2012, 11:32 AM [place], PT  [phone] 12/20/2012

## 2012-12-20 NOTE — Progress Notes (Signed)
Tamara [name] 9:19 AM  Subjective: Patient is doing much better and had multiple bowel movements yesterday and no new complaints and in a much better mental place  Objective: Vital signs stable afebrile no acute distress abdomen is softer and smaller and occasional bowel sound and decreased tenderness and less distention NG output seemingly decreasing  Assessment: Improved  Plan: Continue present management per surgical team and medical team and please call my partner Dr. Randa Evens if any question or problem that we could help with this holiday week and I will check on this weekend when I am on call  [facility] E

## 2012-12-20 NOTE — Progress Notes (Signed)
PARENTERAL NUTRITION CONSULT NOTE - Follow Up  Pharmacy Consult:  TPN Indication: Prolonged post-op ileus  Allergies  Allergen Reactions  . Codeine Nausea And Vomiting  . Zofran [Ondansetron Hcl]    Patient Measurements: Height: 5\' 5"  (165.1 cm) Weight: 136 lb 14.4 oz (62.097 kg) IBW/kg (Calculated) : 57  Vital Signs: Temp: 98.1 F (36.7 C) (11/26 0533) Temp src: Oral (11/26 0533) BP: 157/64 mmHg (11/26 0533) Pulse Rate: 62 (11/26 0533)  Labs:  Recent Labs  12/18/12 0410  WBC 7.9  HGB 8.6*  HCT 25.1*  PLT 302    Recent Labs  12/18/12 0410 12/20/12 0510  NA 126* 131*  K 4.2 4.5  CL 95* 99  CO2 23 24  GLUCOSE 115* 113*  BUN 21 18  CREATININE 0.52 0.48*  CALCIUM 8.7 9.1  MG 2.4  --   PHOS 3.5  --   PROT 5.4*  --   ALBUMIN 2.5*  --   AST 40*  --   ALT 37*  --   ALKPHOS 94  --   BILITOT 0.2*  --   PREALBUMIN 17.0*  --   TRIG 83  --    Estimated Creatinine Clearance: 49.6 ml/min (by C-G formula based on Cr of 0.48).  No results found for this basename: GLUCAP,  in the last 72 hours  Insulin Requirements in the past 24 hours:  None - SSI d/c'ed  Assessment:  39 YOF s/p lap-assisted transverse colectomy on 12/07/12 for partially obstruction transverse colon mass. She continues on TPN for post-op ileus.  GI: intermittent nausea and severe abd pain/discomfort, s/p decompression for cecal dilatation 11/21.  NG clamped 11/25 briefly but did not tolerate so reconnected. O/P 700 ml yesterday.TG WNL, prealbumin remains stable at 17.  Small BM 11/23 and 11/24. On Reglan. Noted may trial erythromycin.  Endo: no hx DM - CBGs well controlled at goal TPN rate. SSI d/c'ed 11/22  Lytes: Hyponatremia continues but Na trending up to 131.  K+ 4.5 (goal ~4 for ileus), others WNL. Renal: SCr stable, CrCL ~50 ml/min. I/O's inaccurate. NS with KCL at 35 ml/hr (provides ~65mEq KCL daily)  Pulm: RA  Cards: hx HTN. BP elevated, HR 62-101.(lisinopril PTA). PRN  hydralazine for SBP > 180  Hepatobil: AST/ALT slightly elevated, others wnl.  Heme/Onc: hx breast cancer - hgb 8.6, plts WNL (stable), FOB+ on 11/10 and 11/17  Neuro: A&O - scheduled APAP, not optimistic about her condition improving  ID: afeb. WBC WNL. C.diff PCR neg - not on abx  Best Practices: Lovenox, SCDs  TPN Access: PICC placed 12/11/12  TPN day#: 8 (11/18 >> )  Current Nutrition:  Clinimix E 5/15 at 80 ml/hr + IVFE at 10 ml/hr providing 1843 kCal and 96 gm protein daily  Estimated Nutritional Needs:  1700 - 1950 kCal; 87-95 g protein daily  Plan:  - Continue Clinimix E 5/15 at 80 ml/hr + IVFE at 10 ml/hr meeting 100% of needs - Daily multivitamin and trace elements - F/u TPN labs on Mon and Myra Gianotti, PharmD, BCPS Clinical pharmacist, pager [phone] 12/20/2012, 8:44 AM

## 2012-12-21 ENCOUNTER — Inpatient Hospital Stay (HOSPITAL_COMMUNITY): Payer: Medicare Other

## 2012-12-21 LAB — COMPREHENSIVE METABOLIC PANEL
ALT: 41 U/L — ABNORMAL HIGH (ref 0–35)
AST: 41 U/L — ABNORMAL HIGH (ref 0–37)
Alkaline Phosphatase: 119 U/L — ABNORMAL HIGH (ref 39–117)
BUN: 18 mg/dL (ref 6–23)
CO2: 23 mEq/L (ref 19–32)
Chloride: 99 mEq/L (ref 96–112)
GFR calc Af Amer: >90 mL/min (ref >90)
GFR calc non Af Amer: >90 mL/min (ref >90)
Glucose, Bld: 110 mg/dL — ABNORMAL HIGH (ref 70–99)
Potassium: 4.4 mEq/L (ref 3.5–5.1)
Sodium: 130 mEq/L — ABNORMAL LOW (ref 135–145)
Total Bilirubin: 0.2 mg/dL — ABNORMAL LOW (ref 0.3–1.2)
Total Protein: 5.8 g/dL — ABNORMAL LOW (ref 6.0–8.3)

## 2012-12-21 LAB — MAGNESIUM: Magnesium: 2.1 mg/dL (ref 1.5–2.5)

## 2012-12-21 MED ORDER — FAT EMULSION 20 % IV EMUL
250.0000 mL | INTRAVENOUS | Status: AC
  Administered 2012-12-21: 18:00:00 250 mL via INTRAVENOUS
  Filled 2012-12-21: qty 250

## 2012-12-21 MED ORDER — TRACE MINERALS CR-CU-F-FE-I-MN-MO-SE-ZN IV SOLN
INTRAVENOUS | Status: AC
  Administered 2012-12-21: 18:00:00 via INTRAVENOUS
  Filled 2012-12-21: qty 2000

## 2012-12-21 NOTE — Progress Notes (Signed)
6 Days Post-Op  Subjective: Pt has had several BMs, asking for bedpan, passing flatus.  No n/v.  NGT has been off suction since yesterday.   Objective: Vital signs in last 24 hours: Temp:  [94.7 F (34.8 C)-98.4 F (36.9 C)] 94.7 F (34.8 C) (11/27 0448) Pulse Rate:  [89-98] 94 (11/27 0448) Resp:  [16-18] 18 (11/27 0448) BP: (146-164)/(60-77) 149/77 mmHg (11/27 0448) SpO2:  [95 %-98 %] 98 % (11/27 0448) Last BM Date: 12/21/12  Intake/Output from previous day: 11/26 0701 - 11/27 0700 In: -  Out: 1050 [Urine:1000; Emesis/NG output:50] Intake/Output this shift:   Physical Exam:  General: Pt awake/alert/oriented x3 in no acute distress  Abdomen: soft, mildly distended, no tenderness, +BS, no HSM, incisions C/D/I, steri-strips  Ext: SCDs BLE. No mjr edema. No cyanosis  Skin: No petechiae / purpura    Lab Results:  No results found for this basename: WBC, HGB, HCT, PLT,  in the last 72 hours BMET  Recent Labs  12/20/12 0510 12/21/12 0600  NA 131* 130*  K 4.5 4.4  CL 99 99  CO2 24 23  GLUCOSE 113* 110*  BUN 18 18  CREATININE 0.48* 0.46*  CALCIUM 9.1 8.9   PT/INR No results found for this basename: LABPROT, INR,  in the last 72 hours ABG No results found for this basename: PHART, PCO2, PO2, HCO3,  in the last 72 hours  Studies/Results: Dg Abd Portable 1v  12/19/2012   CLINICAL DATA:  Abdominal distention. Subsequent encounter for ileus.  EXAM: PORTABLE ABDOMEN - 1 VIEW  COMPARISON:  12/17/2012 dating back to 12/13/2012.  FINDINGS: Persistent moderate gaseous distention of multiple loops of small bowel throughout the abdomen and persistent moderate gaseous distention of the ascending and transverse colon. Maximum cecal diameter of approximately 13 cm (uncorrected for magnification), not significantly changed since yesterday. No suggestion of free air on the supine image. Interval removal of midline surgical skin staples. Nasogastric tube tip projects over the expected  location of the mid body of stomach. Surgical clips in the right upper quadrant from prior cholecystectomy. Degenerative changes involving the lumbar spine and the right hip.  IMPRESSION: 1. Stable generalized ileus. Maximum cecal diameter approximately 13 cm (uncorrected for magnification, approximately 10 cm corrected). 2. No new abnormalities.   Electronically Signed   By: Hulan Saas M.D.   On: 12/19/2012 08:49    Anti-infectives: Anti-infectives   Start     Dose/Rate Route Frequency Ordered Stop   12/07/12 0600  cefoTEtan (CEFOTAN) 2 g in dextrose 5 % 50 mL IVPB     2 g 100 mL/hr over 30 Minutes Intravenous On call to O.R. 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 She is finally opening up, having diarrhea.  C. Diff negative. -recommend removing NGT and starting sips of clears, pt refuses such recommendation.  Discussed progression with the patient.  -continue with reglan  -encourage mobility.  -IS   LOS: 16 days    Olayinka Gathers ANP-BC 12/21/2012 8:19 AM

## 2012-12-21 NOTE — Progress Notes (Signed)
Assessment/Plan: Principal Problem:   GI bleed due to colon cancer - see below Active Problems:   Pacemaker implanted 08/28/12   Essential hypertension, benign - BP is fine.    D (diarrhea)   Ileus, postoperative - finally seems to be improving. I note Ms. Riebock's comments about NGT. I did not push issue with patient. I encouraged her that though things today are not as good as yesterday, she is a lot better than last week!   Subjective: Does not feel as good as yesterday. Some mild nausea. Having BMs but little flatus this morning. No cough, dyspnea, chest pain.   Objective:  Vital Signs: Filed Vitals:   12/20/12 0533 12/20/12 1514 12/20/12 2151 12/21/12 0448  BP: 157/64 164/60 146/74 149/77  Pulse: 62 98 89 94  Temp: 98.1 F (36.7 C) 98.4 F (36.9 C) 97.6 F (36.4 C) 94.7 F (34.8 C)  TempSrc: Oral Oral Oral Oral  Resp: 16 16 18 18   Height:      Weight:      SpO2: 99% 98% 95% 98%     EXAM: Abd: soft, dec BS, nontender   Intake/Output Summary (Last 24 hours) at 12/21/12 0928 Last data filed at 12/20/12 1120  Gross per 24 hour  Intake      0 ml  Output    400 ml  Net   -400 ml    Lab Results:  Recent Labs  12/20/12 0510 12/21/12 0600  NA 131* 130*  K 4.5 4.4  CL 99 99  CO2 24 23  GLUCOSE 113* 110*  BUN 18 18  CREATININE 0.48* 0.46*  CALCIUM 9.1 8.9  MG  --  2.1  PHOS  --  3.2    Recent Labs  12/21/12 0600  AST 41*  ALT 41*  ALKPHOS 119*  BILITOT 0.2*  PROT 5.8*  ALBUMIN 2.6*   No results found for this basename: LIPASE, AMYLASE,  in the last 72 hours No results found for this basename: WBC, NEUTROABS, HGB, HCT, MCV, PLT,  in the last 72 hours No results found for this basename: CKTOTAL, CKMB, CKMBINDEX, TROPONINI,  in the last 72 hours No components found with this basename: POCBNP,  No results found for this basename: DDIMER,  in the last 72 hours No results found for this basename: HGBA1C,  in the last 72 hours No results found for this  basename: CHOL, HDL, LDLCALC, TRIG, CHOLHDL, LDLDIRECT,  in the last 72 hours No results found for this basename: TSH, T4TOTAL, FREET3, T3FREE, THYROIDAB,  in the last 72 hours No results found for this basename: VITAMINB12, FOLATE, FERRITIN, TIBC, IRON, RETICCTPCT,  in the last 72 hours  Studies/Results: No results found. Medications: Medications administered in the last 24 hours reviewed.  Current Medication List reviewed.    LOS: 16 days   [facility] @ [facility] [phone]) 12/21/2012, 9:28 AM

## 2012-12-21 NOTE — Progress Notes (Signed)
PARENTERAL NUTRITION CONSULT NOTE - Follow Up  Pharmacy Consult:  TPN Indication: Prolonged post-op ileus  Allergies  Allergen Reactions  . Codeine Nausea And Vomiting  . Zofran [Ondansetron Hcl]    Patient Measurements: Height: 5\' 5"  (165.1 cm) Weight: 136 lb 14.4 oz (62.097 kg) IBW/kg (Calculated) : 57  Vital Signs: Temp: 94.7 F (34.8 C) (11/27 0448) Temp src: Oral (11/27 0448) BP: 149/77 mmHg (11/27 0448) Pulse Rate: 94 (11/27 0448)  Labs: No results found for this basename: WBC, HGB, HCT, PLT, APTT, INR,  in the last 72 hours  Recent Labs  12/20/12 0510 12/21/12 0600  NA 131* 130*  K 4.5 4.4  CL 99 99  CO2 24 23  GLUCOSE 113* 110*  BUN 18 18  CREATININE 0.48* 0.46*  CALCIUM 9.1 8.9  MG  --  2.1  PHOS  --  3.2  PROT  --  5.8*  ALBUMIN  --  2.6*  AST  --  41*  ALT  --  41*  ALKPHOS  --  119*  BILITOT  --  0.2*   Estimated Creatinine Clearance: 49.6 ml/min (by C-G formula based on Cr of 0.46).  No results found for this basename: GLUCAP,  in the last 72 hours  Insulin Requirements in the past 24 hours:  None - SSI d/c'ed  Assessment:  Tamara [name] s/p lap-assisted transverse colectomy on 12/07/12 for partially obstruction transverse colon mass. She continues on TPN for post-op ileus.  GI: Intermittent nausea and severe abd pain/discomfort, s/p decompression for cecal dilatation 11/21.  NG clamped 11/26.TG WNL, prealbumin remains stable at 17.  Pt having loose stools now. Cdiff negative. Remains on Reglan.   Endo: no hx DM - CBGs well controlled at goal TPN rate. SSI d/c'ed 11/22  Lytes: Hyponatremia continues but Na relatively stable at 131.  K+ 4.4 (goal ~4 for ileus), others WNL.  Renal: SCr stable, CrCL ~50 ml/min. I/O's inaccurate. NS with KCL at 35 ml/hr (provides ~90mEq KCL daily)  Pulm: RA  Cards: hx HTN. BP elevated, HR 90s.(lisinopril PTA). PRN hydralazine for SBP > 180  Hepatobil: LFTs slightly elevated.  Heme/Onc: hx breast cancer.  11/24 hgb 8.6, plts WNL (stable), FOB+ on 11/10 and 11/17  Neuro: A&O - scheduled APAP  ID: afeb. WBC WNL.   Best Practices: Lovenox, SCDs  TPN Access: PICC placed 12/11/12  TPN day#: 9 (11/18 >> )  Current Nutrition:  Clinimix E 5/15 at 80 ml/hr + IVFE at 10 ml/hr providing 1843 kCal and 96 gm protein daily  Estimated Nutritional Needs:  1700 - 1950 kCal; 87-95 g protein daily  Plan:  - Continue Clinimix E 5/15 at 80 ml/hr + IVFE at 10 ml/hr meeting 100% of needs - Daily multivitamin and trace elements - F/u TPN labs on Mon and Thur - Will f/u ability to start po diet  Christoper Fabian, PharmD, BCPS Clinical pharmacist, pager [phone] 12/21/2012, 8:16 AM

## 2012-12-21 NOTE — Progress Notes (Signed)
General surgery attending note:  I have interviewed and examined this patient this morning. I agree with the assessment and treatment plan suggested by this re\re block, and paint.  The patient is stable but generally uncomfortable. She is having lots of diarrhea C. Difficile PCR  is negative.NG has been clamped. She has a little bit of nausea. Abdomen is still uncomfortable and distended.  On exam her abdomen is soft but distended, tympanitic, with bowel sounds present.   A/P: Partially obstructing colon mass found to be Invasive AdenoCA, neg lymph node involvement, S/P lap assisted transverse, splenic flexure colectomy 12/08/2012 (POD #13)  Post-op ileus, necessitation colonoscopic decompression 12/15/2012 Check abdominal x-rays now before removing the NG tube.  Diarrhea. C. Diff neg. On reglan (?)   PCM on TNA   [place] Gustafson M. Derrell Lolling, M.D., [facility] General and Minimally invasive Surgery Breast and Colorectal Surgery Office:   [phone] Pager:   [phone]

## 2012-12-21 NOTE — Progress Notes (Signed)
Pt has systolic Bp >170 given PRN hydralazine. Will re-check Bp in an hr. RN noticed pt has been diuresing early this shift. Will continue to monitor.

## 2012-12-22 ENCOUNTER — Encounter (HOSPITAL_COMMUNITY)
Admission: EM | Disposition: A | Discharge status: Skilled Nursing Facility | Payer: Self-pay | Source: Home / Self Care | Attending: Internal Medicine

## 2012-12-22 ENCOUNTER — Encounter (HOSPITAL_COMMUNITY): Payer: Medicare Other | Admitting: Certified Registered Nurse Anesthetist

## 2012-12-22 ENCOUNTER — Inpatient Hospital Stay (HOSPITAL_COMMUNITY): Payer: Medicare Other

## 2012-12-22 ENCOUNTER — Inpatient Hospital Stay (HOSPITAL_COMMUNITY): Payer: Medicare Other | Admitting: Certified Registered Nurse Anesthetist

## 2012-12-22 DIAGNOSIS — R030 Elevated blood-pressure reading, without diagnosis of hypertension: Secondary | ICD-10-CM

## 2012-12-22 DIAGNOSIS — K560 Paralytic ileus: Secondary | ICD-10-CM

## 2012-12-22 DIAGNOSIS — K929 Disease of digestive system, unspecified: Secondary | ICD-10-CM

## 2012-12-22 DIAGNOSIS — I10 Essential (primary) hypertension: Secondary | ICD-10-CM

## 2012-12-22 DIAGNOSIS — K565 Intestinal adhesions [bands], unspecified as to partial versus complete obstruction: Secondary | ICD-10-CM

## 2012-12-22 DIAGNOSIS — K6389 Other specified diseases of intestine: Secondary | ICD-10-CM

## 2012-12-22 HISTORY — PX: COLON RESECTION: SHX5231

## 2012-12-22 LAB — GLUCOSE, CAPILLARY: Glucose-Capillary: 135 mg/dL — ABNORMAL HIGH (ref 70–99)

## 2012-12-22 LAB — SURGICAL PCR SCREEN: Staphylococcus aureus: NEGATIVE

## 2012-12-22 SURGERY — COLON RESECTION
Anesthesia: General | Site: Abdomen | Wound class: Dirty or Infected

## 2012-12-22 MED ORDER — ONDANSETRON HCL 4 MG/2ML IJ SOLN: 4.0000 mg | Freq: Once | INTRAMUSCULAR | Status: DC | PRN

## 2012-12-22 MED ORDER — ROCURONIUM BROMIDE 100 MG/10ML IV SOLN
INTRAVENOUS | Status: DC | PRN
  Administered 2012-12-22 (×2): 20 mg via INTRAVENOUS

## 2012-12-22 MED ORDER — SODIUM CHLORIDE 0.9 % IV SOLN
1.0000 g | INTRAVENOUS | Status: AC
  Administered 2012-12-23: 1 g via INTRAVENOUS
  Filled 2012-12-22 (×2): qty 1

## 2012-12-22 MED ORDER — PROMETHAZINE HCL 25 MG/ML IJ SOLN
INTRAMUSCULAR | Status: AC
  Administered 2012-12-22: 12.5 mg via INTRAVENOUS
  Filled 2012-12-22: qty 1

## 2012-12-22 MED ORDER — DIPHENHYDRAMINE HCL 12.5 MG/5ML PO ELIX
12.5000 mg | ORAL_SOLUTION | Freq: Four times a day (QID) | ORAL | Status: DC | PRN
  Filled 2012-12-22: qty 5

## 2012-12-22 MED ORDER — 0.9 % SODIUM CHLORIDE (POUR BTL) OPTIME
TOPICAL | Status: DC | PRN
  Administered 2012-12-22: 2000 mL

## 2012-12-22 MED ORDER — ONDANSETRON HCL 4 MG/2ML IJ SOLN: 4.0000 mg | Freq: Four times a day (QID) | INTRAMUSCULAR | Status: DC | PRN

## 2012-12-22 MED ORDER — FENTANYL CITRATE 0.05 MG/ML IJ SOLN
INTRAMUSCULAR | Status: AC
  Administered 2012-12-22: 50 ug via INTRAVENOUS
  Filled 2012-12-22: qty 2

## 2012-12-22 MED ORDER — HYDROMORPHONE HCL PF 1 MG/ML IJ SOLN
INTRAMUSCULAR | Status: AC
  Administered 2012-12-22: 0.25 mg via INTRAVENOUS
  Filled 2012-12-22: qty 1

## 2012-12-22 MED ORDER — FENTANYL CITRATE 0.05 MG/ML IJ SOLN
INTRAMUSCULAR | Status: AC
  Filled 2012-12-22: qty 2

## 2012-12-22 MED ORDER — SODIUM CHLORIDE 0.9 % IJ SOLN: 9.0000 mL | INTRAMUSCULAR | Status: DC | PRN

## 2012-12-22 MED ORDER — LIDOCAINE HCL (CARDIAC) 20 MG/ML IV SOLN
INTRAVENOUS | Status: DC | PRN
  Administered 2012-12-22: 80 mg via INTRAVENOUS

## 2012-12-22 MED ORDER — PROMETHAZINE HCL 25 MG/ML IJ SOLN: 6.2500 mg | Freq: Four times a day (QID) | INTRAMUSCULAR | Status: DC | PRN

## 2012-12-22 MED ORDER — HYDROMORPHONE HCL PF 1 MG/ML IJ SOLN
INTRAMUSCULAR | Status: AC
  Administered 2012-12-22: 0.5 mg via INTRAVENOUS
  Filled 2012-12-22: qty 1

## 2012-12-22 MED ORDER — HYDROMORPHONE HCL PF 1 MG/ML IJ SOLN
0.2500 mg | INTRAMUSCULAR | Status: DC | PRN
  Administered 2012-12-22 (×2): 0.25 mg via INTRAVENOUS
  Administered 2012-12-22 (×3): 0.5 mg via INTRAVENOUS

## 2012-12-22 MED ORDER — LACTATED RINGERS IV SOLN
INTRAVENOUS | Status: DC
  Administered 2012-12-22: 15:00:00 via INTRAVENOUS

## 2012-12-22 MED ORDER — FENTANYL CITRATE 0.05 MG/ML IJ SOLN
INTRAMUSCULAR | Status: DC | PRN
  Administered 2012-12-22 (×2): 50 ug via INTRAVENOUS
  Administered 2012-12-22: 150 ug via INTRAVENOUS

## 2012-12-22 MED ORDER — GLYCOPYRROLATE 0.2 MG/ML IJ SOLN
INTRAMUSCULAR | Status: DC | PRN
  Administered 2012-12-22: 0.6 mg via INTRAVENOUS

## 2012-12-22 MED ORDER — DEXTROSE 5 % IV SOLN
2.0000 g | INTRAVENOUS | Status: DC | PRN
  Administered 2012-12-22: 2 g via INTRAVENOUS

## 2012-12-22 MED ORDER — OXYCODONE HCL 5 MG/5ML PO SOLN: 5.0000 mg | Freq: Once | ORAL | Status: DC | PRN

## 2012-12-22 MED ORDER — FAT EMULSION 20 % IV EMUL
250.0000 mL | INTRAVENOUS | Status: AC
  Administered 2012-12-22: 250 mL via INTRAVENOUS
  Filled 2012-12-22: qty 250

## 2012-12-22 MED ORDER — OXYCODONE HCL 5 MG PO TABS: 5.0000 mg | ORAL_TABLET | Freq: Once | ORAL | Status: DC | PRN

## 2012-12-22 MED ORDER — FENTANYL CITRATE 0.05 MG/ML IJ SOLN
25.0000 ug | INTRAMUSCULAR | Status: DC | PRN
  Administered 2012-12-22 (×2): 50 ug via INTRAVENOUS

## 2012-12-22 MED ORDER — FENTANYL 10 MCG/ML IV SOLN
INTRAVENOUS | Status: DC
  Administered 2012-12-22: 19:00:00 via INTRAVENOUS
  Filled 2012-12-22: qty 50

## 2012-12-22 MED ORDER — HEPARIN SODIUM (PORCINE) 5000 UNIT/ML IJ SOLN
5000.0000 [IU] | Freq: Three times a day (TID) | INTRAMUSCULAR | Status: DC
  Administered 2012-12-23 – 2012-12-27 (×13): 5000 [IU] via SUBCUTANEOUS
  Filled 2012-12-22 (×18): qty 1

## 2012-12-22 MED ORDER — PHENYLEPHRINE HCL 10 MG/ML IJ SOLN
INTRAMUSCULAR | Status: DC | PRN
  Administered 2012-12-22 (×4): 80 ug via INTRAVENOUS

## 2012-12-22 MED ORDER — DEXTROSE 5 % IV SOLN
2.0000 g | Freq: Once | INTRAVENOUS | Status: DC
  Filled 2012-12-22: qty 2

## 2012-12-22 MED ORDER — DEXTROSE 10 % IV SOLN
INTRAVENOUS | Status: DC
  Administered 2012-12-22: 20:00:00 via INTRAVENOUS

## 2012-12-22 MED ORDER — FENTANYL CITRATE 0.05 MG/ML IJ SOLN
50.0000 ug | INTRAMUSCULAR | Status: DC | PRN
  Administered 2012-12-22 – 2012-12-23 (×3): 50 ug via INTRAVENOUS
  Filled 2012-12-22 (×2): qty 2

## 2012-12-22 MED ORDER — PROPOFOL 10 MG/ML IV BOLUS
INTRAVENOUS | Status: DC | PRN
  Administered 2012-12-22: 100 mg via INTRAVENOUS

## 2012-12-22 MED ORDER — PROMETHAZINE HCL 25 MG/ML IJ SOLN
12.5000 mg | Freq: Once | INTRAMUSCULAR | Status: AC
  Administered 2012-12-22: 12.5 mg via INTRAVENOUS

## 2012-12-22 MED ORDER — NEOSTIGMINE METHYLSULFATE 1 MG/ML IJ SOLN
INTRAMUSCULAR | Status: DC | PRN
  Administered 2012-12-22: 4 mg via INTRAVENOUS

## 2012-12-22 MED ORDER — NALOXONE HCL 0.4 MG/ML IJ SOLN: 0.4000 mg | INTRAMUSCULAR | Status: DC | PRN

## 2012-12-22 MED ORDER — DIPHENHYDRAMINE HCL 50 MG/ML IJ SOLN: 12.5000 mg | Freq: Four times a day (QID) | INTRAMUSCULAR | Status: DC | PRN

## 2012-12-22 MED ORDER — LABETALOL HCL 5 MG/ML IV SOLN
INTRAVENOUS | Status: DC | PRN
  Administered 2012-12-22: 2.5 mg via INTRAVENOUS

## 2012-12-22 MED ORDER — TRACE MINERALS CR-CU-F-FE-I-MN-MO-SE-ZN IV SOLN
INTRAVENOUS | Status: AC
  Administered 2012-12-22: 22:00:00 via INTRAVENOUS
  Filled 2012-12-22: qty 2000

## 2012-12-22 SURGICAL SUPPLY — 58 items
BANDAGE GAUZE ELAST BULKY 4 IN (GAUZE/BANDAGES/DRESSINGS) ×2 IMPLANT
BLADE SURG ROTATE 9660 (MISCELLANEOUS) IMPLANT
CANISTER SUCTION 2500CC (MISCELLANEOUS) IMPLANT
CHLORAPREP W/TINT 26ML (MISCELLANEOUS) ×2 IMPLANT
COVER MAYO STAND STRL (DRAPES) ×4 IMPLANT
COVER TABLE BACK 60X90 (DRAPES) ×2 IMPLANT
DRAPE LAPAROSCOPIC ABDOMINAL (DRAPES) ×2 IMPLANT
DRAPE PROXIMA HALF (DRAPES) IMPLANT
DRAPE UTILITY 15X26 W/TAPE STR (DRAPE) ×10 IMPLANT
DRAPE WARM FLUID 44X44 (DRAPE) ×2 IMPLANT
DRSG OPSITE POSTOP 4X10 (GAUZE/BANDAGES/DRESSINGS) IMPLANT
DRSG OPSITE POSTOP 4X8 (GAUZE/BANDAGES/DRESSINGS) IMPLANT
ELECT BLADE 6.5 EXT (BLADE) ×2 IMPLANT
ELECT CAUTERY BLADE 6.4 (BLADE) ×4 IMPLANT
ELECT REM PT RETURN 9FT ADLT (ELECTROSURGICAL) ×2
ELECTRODE REM PT RTRN 9FT ADLT (ELECTROSURGICAL) ×1 IMPLANT
GLOVE BIO SURGEON STRL SZ7 (GLOVE) ×6 IMPLANT
GLOVE BIOGEL PI IND STRL 6.5 (GLOVE) ×2 IMPLANT
GLOVE BIOGEL PI IND STRL 7.5 (GLOVE) ×2 IMPLANT
GLOVE BIOGEL PI INDICATOR 6.5 (GLOVE) ×2
GLOVE BIOGEL PI INDICATOR 7.5 (GLOVE) ×2
GLOVE SURG ORTHO 8.0 STRL STRW (GLOVE) ×2 IMPLANT
GLOVE SURG SS PI 6.0 STRL IVOR (GLOVE) ×2 IMPLANT
GOWN STRL NON-REIN LRG LVL3 (GOWN DISPOSABLE) ×12 IMPLANT
GOWN STRL REIN XL XLG (GOWN DISPOSABLE) ×2 IMPLANT
KIT BASIN OR (CUSTOM PROCEDURE TRAY) ×2 IMPLANT
KIT OSTOMY DRAINABLE 2.75 STR (WOUND CARE) ×2 IMPLANT
KIT ROOM TURNOVER OR (KITS) ×2 IMPLANT
LEGGING LITHOTOMY PAIR STRL (DRAPES) IMPLANT
LIGASURE IMPACT 36 18CM CVD LR (INSTRUMENTS) ×2 IMPLANT
NS IRRIG 1000ML POUR BTL (IV SOLUTION) ×4 IMPLANT
PACK GENERAL/GYN (CUSTOM PROCEDURE TRAY) ×2 IMPLANT
PAD ARMBOARD 7.5X6 YLW CONV (MISCELLANEOUS) ×2 IMPLANT
PENCIL BUTTON HOLSTER BLD 10FT (ELECTRODE) ×2 IMPLANT
RELOAD PROXIMATE 75MM BLUE (ENDOMECHANICALS) ×2 IMPLANT
SPONGE GAUZE 4X4 12PLY (GAUZE/BANDAGES/DRESSINGS) ×2 IMPLANT
SPONGE LAP 18X18 X RAY DECT (DISPOSABLE) ×4 IMPLANT
STAPLER PROXIMATE 75MM BLUE (STAPLE) ×2 IMPLANT
STAPLER VISISTAT 35W (STAPLE) ×2 IMPLANT
SUCTION POOLE TIP (SUCTIONS) ×2 IMPLANT
SURGILUBE 2OZ TUBE FLIPTOP (MISCELLANEOUS) IMPLANT
SUT PDS AB 1 TP1 96 (SUTURE) ×4 IMPLANT
SUT PROLENE 2 0 CT2 30 (SUTURE) IMPLANT
SUT PROLENE 2 0 KS (SUTURE) IMPLANT
SUT PROLENE 3 0 SH 48 (SUTURE) ×2 IMPLANT
SUT SILK 2 0 SH CR/8 (SUTURE) ×2 IMPLANT
SUT SILK 2 0 TIES 10X30 (SUTURE) ×2 IMPLANT
SUT SILK 3 0 SH CR/8 (SUTURE) ×2 IMPLANT
SUT SILK 3 0 TIES 10X30 (SUTURE) ×4 IMPLANT
SUT VIC AB 3-0 SH 18 (SUTURE) IMPLANT
SYR BULB IRRIGATION 50ML (SYRINGE) ×2 IMPLANT
TAPE CLOTH SURG 4X10 WHT LF (GAUZE/BANDAGES/DRESSINGS) ×2 IMPLANT
TOWEL OR 17X26 10 PK STRL BLUE (TOWEL DISPOSABLE) ×4 IMPLANT
TRAY FOLEY CATH 14FRSI W/METER (CATHETERS) ×2 IMPLANT
TRAY PROCTOSCOPIC FIBER OPTIC (SET/KITS/TRAYS/PACK) IMPLANT
TUBE CONNECTING 12X1/4 (SUCTIONS) ×2 IMPLANT
WATER STERILE IRR 1000ML POUR (IV SOLUTION) IMPLANT
YANKAUER SUCT BULB TIP NO VENT (SUCTIONS) ×2 IMPLANT

## 2012-12-22 NOTE — Progress Notes (Signed)
Subjective: Patient added on by Dr. Debby Bud this morning.  Reviewing chart.  Metoclopramide could be contributing to further diarrhea but still has a dilated cecum...  Objective: Weight change:  No intake or output data in the 24 hours ending 12/22/12 0842 Filed Vitals:   2013-01-17 0448 01/17/2013 2151 12/22/12 0250 12/22/12 0519  BP: 149/77 187/93 124/59 138/60  Pulse: 94 94  77  Temp: 94.7 F (34.8 C) 97.7 F (36.5 C)  98 F (36.7 C)  TempSrc: Oral Oral  Oral  Resp: 18 18    Height:      Weight:      SpO2: 98% 98%      Lab Results: Results for orders placed during the hospital encounter of 12/05/12 (from the past 48 hour(s))  CLOSTRIDIUM DIFFICILE BY PCR     Status: None   Collection Time    12/20/12 10:40 AM      Result Value Range   C difficile by pcr NEGATIVE  NEGATIVE  COMPREHENSIVE METABOLIC PANEL     Status: Abnormal   Collection Time    01/17/2013  6:00 AM      Result Value Range   Sodium 130 (*) 135 - 145 mEq/L   Potassium 4.4  3.5 - 5.1 mEq/L   Chloride 99  96 - 112 mEq/L   CO2 23  19 - 32 mEq/L   Glucose, Bld 110 (*) 70 - 99 mg/dL   BUN 18  6 - 23 mg/dL   Creatinine, Ser 8.65 (*) 0.50 - 1.10 mg/dL   Calcium 8.9  8.4 - 78.4 mg/dL   Total Protein 5.8 (*) 6.0 - 8.3 g/dL   Albumin 2.6 (*) 3.5 - 5.2 g/dL   AST 41 (*) 0 - 37 U/L   ALT 41 (*) 0 - 35 U/L   Alkaline Phosphatase 119 (*) 39 - 117 U/L   Total Bilirubin 0.2 (*) 0.3 - 1.2 mg/dL   GFR calc non Af Amer >90  >90 mL/min   GFR calc Af Amer >90  >90 mL/min   Comment: (NOTE)     The eGFR has been calculated using the CKD EPI equation.     This calculation has not been validated in all clinical situations.     eGFR's persistently <90 mL/min signify possible Chronic Kidney     Disease.  MAGNESIUM     Status: None   Collection Time    01-17-2013  6:00 AM      Result Value Range   Magnesium 2.1  1.5 - 2.5 mg/dL  PHOSPHORUS     Status: None   Collection Time    01-17-13  6:00 AM      Result Value Range   Phosphorus 3.2  2.3 - 4.6 mg/dL    Studies/Results: Dg Abd 2 Views  01-17-13   CLINICAL DATA:  Postop abdominal distention with diarrhea.  EXAM: ABDOMEN - 2 VIEW  COMPARISON:  Radiographs 12/19/2012 and 12/17/2012.  CT 12/12/2012.  FINDINGS: Nasogastric tube remains in the proximal stomach, only visualized on the decubitus view. Diffuse small bowel distention is again noted. There is progressive distention of the cecum, now measuring 14.4 cm transverse. Decubitus view demonstrates no free intraperitoneal air. Some gas is present within the rectum.  IMPRESSION: Progressive cecal distension status post distal colonic resection. Diffuse small bowel distension is unchanged. Based on the prior studies, the findings remain most consistent with an ileus, although stenosis at the colonic anastomosis cannot be excluded.   Electronically Signed  By: Roxy Horseman M.D.   On: 12/21/2012 13:04   Medications: Scheduled Meds: . acetaminophen  650 mg Rectal Q6H   Or  . acetaminophen  650 mg Oral Q6H  . enoxaparin (LOVENOX) injection  30 mg Subcutaneous Q24H  . metoCLOPramide (REGLAN) injection  5 mg Intravenous TID PC & HS  . pantoprazole (PROTONIX) IV  40 mg Intravenous Q12H   Continuous Infusions: . 0.9 % NaCl with KCl 40 mEq / L 35 mL/hr at 12/21/12 1053  . Marland KitchenTPN (CLINIMIX-E) Adult 80 mL/hr at 12/21/12 1806   And  . fat emulsion 250 mL (12/21/12 1807)  . Marland KitchenTPN (CLINIMIX-E) Adult     And  . fat emulsion     PRN Meds:.bisacodyl, fentaNYL, hydrALAZINE, menthol-cetylpyridinium, methocarbamol (ROBAXIN) IV, midazolam, phenol, promethazine, sodium chloride  Assessment/Plan:  Consider stopping Reglan, will defer to surgery    LOS: 17 days   Pearla Dubonnet, MD 12/22/2012, 8:42 AM

## 2012-12-22 NOTE — Anesthesia Preprocedure Evaluation (Signed)
Anesthesia Evaluation  Patient identified by MRN, date of birth, ID band Patient awake    Reviewed: Allergy & Precautions, H&P , NPO status , Patient's Chart, lab work & pertinent test results  Airway Mallampati: I TM Distance: >3 FB Neck ROM: Full    Dental  (+) Teeth Intact and Dental Advisory Given   Pulmonary  breath sounds clear to auscultation        Cardiovascular hypertension, Pt. on medications + pacemaker Rhythm:Regular Rate:Normal     Neuro/Psych    GI/Hepatic   Endo/Other    Renal/GU      Musculoskeletal   Abdominal   Peds  Hematology   Anesthesia Other Findings   Reproductive/Obstetrics                           Anesthesia Physical Anesthesia Plan  ASA: III  Anesthesia Plan: General   Post-op Pain Management:    Induction: Intravenous  Airway Management Planned: Oral ETT  Additional Equipment:   Intra-op Plan:   Post-operative Plan: Extubation in OR  Informed Consent: I have reviewed the patients History and Physical, chart, labs and discussed the procedure including the risks, benefits and alternatives for the proposed anesthesia with the patient or authorized representative who has indicated his/her understanding and acceptance.   Dental advisory given  Plan Discussed with: CRNA, Anesthesiologist and Surgeon  Anesthesia Plan Comments:         Anesthesia Quick Evaluation

## 2012-12-22 NOTE — Transfer of Care (Signed)
Immediate Anesthesia Transfer of Care Note  Patient: Tamara [name]  Procedure(s) Performed: Procedure(s): EXPLORATORY LAPAROTOMY , RIGHT Colectomy IILEOSTOMY (N/A)  Patient Location: PACU  Anesthesia Type:General  Level of Consciousness: awake and alert   Airway & Oxygen Therapy: Patient Spontanous Breathing and Patient connected to nasal cannula oxygen  Post-op Assessment: Report given to PACU RN, Post -op Vital signs reviewed and stable and Patient moving all extremities X 4  Post vital signs: Reviewed and stable  Complications: No apparent anesthesia complications

## 2012-12-22 NOTE — Progress Notes (Signed)
Pt has had numerous, frequent watery stools over the last 24 hrs. Stools have been mixed with urine, but we estimate 500 cc or so of stools total over 24 hrs.

## 2012-12-22 NOTE — Progress Notes (Signed)
After surgery, TPN infusion complete.  IV team called but new bag is not immediately available.  IV team will hang new bag soon but will infuse D10w per standing orders at 80 mls per hour.  Port cleaned with CHG per protocol, allowed to dry and then D10 connected per sterile technique.

## 2012-12-22 NOTE — Progress Notes (Signed)
Ms. Ketter arrived to the pacu from OR on a bed.  Pt. Is moaning in pain and c/o nausea.  Rokoshi Flowers CRNA gave fentanyl for pain.  Dr. Ivin Booty at the bedside and ordered phenergan for nausea.

## 2012-12-22 NOTE — Preoperative (Signed)
Beta Blockers   Reason not to administer Beta Blockers:Not Applicable 

## 2012-12-22 NOTE — Progress Notes (Signed)
Subjective: Tamara [name] admitted with GI bleed 2/2 adenoCa - s/p colectomy followed by prolonged ileus. She has been on TNA. Notes, consults and labs reviewed.  Tamara [name] is awake and alert. She relates that a simple ice pack gave her relief from excruciating abdominal pain and that she was able to sleep 3-4 hours straight for the first time this hospitalization. She has had multiple BMs.  Objective: Lab: No results found for this basename: WBC, NEUTROABS, HGB, HCT, MCV, PLT,  in the last 72 hours  Recent Labs  12/20/12 0510 12/21/12 0600  NA 131* 130*  K 4.5 4.4  CL 99 99  GLUCOSE 113* 110*  BUN 18 18  CREATININE 0.48* 0.46*  CALCIUM 9.1 8.9  MG  --  2.1  PHOS  --  3.2   Minimal elevation LFTs: AST/ALT 41/41; alk phos minimal elevation at 119; albumin and total protein low.   Stool for C. Diff - 11/26 negative  Imaging: 11/27 2 view Abd - IMPRESSION:  Progressive cecal distension status post distal colonic resection.  Diffuse small bowel distension is unchanged. Based on the prior  studies, the findings remain most consistent with an ileus, although  stenosis at the colonic anastomosis cannot be excluded. (cecal diameter 10 up to 14.4 cm)  Scheduled Meds: . acetaminophen  650 mg Rectal Q6H   Or  . acetaminophen  650 mg Oral Q6H  . enoxaparin (LOVENOX) injection  30 mg Subcutaneous Q24H  . metoCLOPramide (REGLAN) injection  5 mg Intravenous TID PC & HS  . pantoprazole (PROTONIX) IV  40 mg Intravenous Q12H   Continuous Infusions: . 0.9 % NaCl with KCl 40 mEq / L 35 mL/hr at 12/21/12 1053  . Marland KitchenTPN (CLINIMIX-E) Adult 80 mL/hr at 12/21/12 1806   And  . fat emulsion 250 mL (12/21/12 1807)   PRN Meds:.bisacodyl, fentaNYL, hydrALAZINE, menthol-cetylpyridinium, methocarbamol (ROBAXIN) IV, midazolam, phenol, promethazine, sodium chloride   Physical Exam: Filed Vitals:   12/22/12 0519  BP: 138/60  Pulse: 77  Temp: 98 F (36.7 C)  Resp:    Gen'l- pleasant woman in  no acute distress HEENT - NG tube in place. C&S clear Cor 2+ radial, RRR Pulm - normal respirations Abd - protuberant, BS- drips/tinkles, distended and tympanitic. Not tender Neuro - A&O x 3, speech clear. Great recall.       Assessment/Plan: 1. GI - s/p colectomy with prolonged ileus with dilation at the cecum. Very slow recovery. No N/V at this time and NG remains clamped. She is very reticent to give up the NG due to fear of N/V.  Plan Continued waiting for ileus to resolve.  2. HTN -  BP is stable       [facility] (o) [phone]; (c) [phone] Call-grp - [facility]  Tele: [phone]  12/22/2012, 7:16 AM

## 2012-12-22 NOTE — Progress Notes (Signed)
NUTRITION FOLLOW UP  Intervention:   TPN per pharmacy RD will continue to monitor  Nutrition Dx:   Inadequate oral intake related to altered GI function as evidenced by very limited PO intake; ongoing  Goal:   Intake to meet >90% of estimated nutrition needs. Met with TPN.  Monitor:   Wt trends, labs, I/O's, tolerance of TPN, NPO status  Assessment:   PMHx significant for breast CA and colitis. Pt was recently admitted for colitis, and presented to ER 2/2 rectal bleeding. She states that since her last discharge she has been having dark, liquidy stools.   Underwent flex sig on 11/12 - findings revealed fungating ulcerated nonobstructing mass (suspected malignancy) in transverse colon -s/p biopsies and tattooing and internal hemorrhoids.  Underwent partial colectomy on 11/13. NGT placed 11/16 pt with 575 ml of output yesterday. Pt has not been able to advance beyond clear liquids during this admission. Patient is receiving TPN with Clinimix E 5/15 @ 80 ml/hr and lipids @ 10 ml/hr. Provides 1843 kcal, and 96 grams protein per day. Meets 100% minimum estimated energy needs and 100% minimum estimated protein needs.  Per RN, pt is doing fine with TPN. Pt has started passing air and having stools. Xray on 11/28 shows diffuse SB distension and colonic distension with cecum 14 cm. Cecum is increasing in size and physician is recommending another colonoscopy with decompression tube, which pt is refusing. Pt will have RN flush the NG to make sure it is working and place rectal tube to suction and get another KUB today.  Patient is receiving TPN with Clinimix E 5/15 @ 80 ml/hr and lipids @ 10 ml/hr. Provides 1843 kcal, and 96 grams protein per day. Meets 100% minimum estimated energy needs and 100% minimum estimated protein needs.  NA: 130 Cr: 0.49 CBG: 110-115 K, MG, PHOS: WNL PAB: 17.0   Height: Ht Readings from Last 1 Encounters:  12/05/12 5\' 5"  (1.651 m)    Weight Status:   Wt  Readings from Last 1 Encounters:  12/17/12 136 lb 14.4 oz (62.097 kg)    Re-estimated needs:  Kcal: 1700-1900 Protein: 80-95 g Fluid: 1.7-1.9 L/d  Skin: incision on abdomen   Diet Order: NPO  No intake or output data in the 24 hours ending 12/22/12 1028  Last BM: 11/28  Labs:   Recent Labs Lab 12/18/12 0410 12/20/12 0510 12/21/12 0600  NA 126* 131* 130*  K 4.2 4.5 4.4  CL 95* 99 99  CO2 23 24 23   BUN 21 18 18   CREATININE 0.52 0.48* 0.46*  CALCIUM 8.7 9.1 8.9  MG 2.4  --  2.1  PHOS 3.5  --  3.2  GLUCOSE 115* 113* 110*    CBG (last 3)  No results found for this basename: GLUCAP,  in the last 72 hours  Scheduled Meds: . acetaminophen  650 mg Rectal Q6H   Or  . acetaminophen  650 mg Oral Q6H  . enoxaparin (LOVENOX) injection  30 mg Subcutaneous Q24H  . pantoprazole (PROTONIX) IV  40 mg Intravenous Q12H    Continuous Infusions: . 0.9 % NaCl with KCl 40 mEq / L 35 mL/hr at 12/21/12 1053  . Marland KitchenTPN (CLINIMIX-E) Adult 80 mL/hr at 12/21/12 1806   And  . fat emulsion 250 mL (12/21/12 1807)  . Marland KitchenTPN (CLINIMIX-E) Adult     And  . fat emulsion      Ebbie Latus RD, LDN

## 2012-12-22 NOTE — Progress Notes (Signed)
PARENTERAL NUTRITION CONSULT NOTE - Follow Up  Pharmacy Consult:  TPN Indication: Prolonged post-op ileus  Allergies  Allergen Reactions  . Codeine Nausea And Vomiting  . Zofran [Ondansetron Hcl]    Patient Measurements: Height: 5\' 5"  (165.1 cm) Weight: 136 lb 14.4 oz (62.097 kg) IBW/kg (Calculated) : 57  Vital Signs: Temp: 98 F (36.7 C) (11/28 0519) Temp src: Oral (11/28 0519) BP: 138/60 mmHg (11/28 0519) Pulse Rate: 77 (11/28 0519)  Labs: No results found for this basename: WBC, HGB, HCT, PLT, APTT, INR,  in the last 72 hours  Recent Labs  12/20/12 0510 12/21/12 0600  NA 131* 130*  K 4.5 4.4  CL 99 99  CO2 24 23  GLUCOSE 113* 110*  BUN 18 18  CREATININE 0.48* 0.46*  CALCIUM 9.1 8.9  MG  --  2.1  PHOS  --  3.2  PROT  --  5.8*  ALBUMIN  --  2.6*  AST  --  41*  ALT  --  41*  ALKPHOS  --  119*  BILITOT  --  0.2*   Estimated Creatinine Clearance: 49.6 ml/min (by C-G formula based on Cr of 0.46).  No results found for this basename: GLUCAP,  in the last 72 hours  Insulin Requirements in the past 24 hours:  None - SSI d/c'ed  Assessment:  75 YOF s/p lap-assisted transverse colectomy on 12/07/12 for partially obstruction transverse colon mass. She continues on TPN for post-op ileus.  GI: Intermittent nausea and severe abd pain/discomfort, s/p decompression for cecal dilatation 11/21.  NG clamped 11/26.TG WNL, prealbumin remains stable at 17.  Pt having loose stools now. Cdiff negative. Remains on Reglan. NGT has been off suction since 12/20/12. She has had multiple BMs.  Ice pack to abdomen provided pain relief.  Pt not wanting to give up NG due to fear of N/V.  Endo: no hx DM - CBGs well controlled at goal TPN rate. SSI d/c'ed 11/22  Lytes:  No labs today, yesterday: Hyponatremia continues but Na relatively stable at 131.  K+ 4.4 (goal ~4 for ileus), others WNL.  Renal: SCr stable, CrCL ~50 ml/min. I/O's inaccurate. NS with KCL at 35 ml/hr (provides  ~94mEq KCL daily)  Pulm: RA  Cards: hx HTN. BP elevated, HR 90s.(lisinopril PTA). PRN hydralazine for SBP > 180  Hepatobil: LFTs slightly elevated.  Heme/Onc: hx breast cancer. 11/24 hgb 8.6, plts WNL (stable), FOB+ on 11/10 and 11/17  Neuro: A&O - scheduled APAP  ID: afeb. WBC WNL.   Best Practices: Lovenox, SCDs  TPN Access: PICC placed 12/11/12  TPN day#: 11 (11/18 >> )  Current Nutrition:  Clinimix E 5/15 at 80 ml/hr + IVFE at 10 ml/hr providing 1843 kCal and 96 gm protein daily  Estimated Nutritional Needs:  1700 - 1950 kCal; 87-95 g protein daily  Plan:  - Continue Clinimix E 5/15 at 80 ml/hr + IVFE at 10 ml/hr meeting 100% of needs - Daily multivitamin and trace elements - F/u TPN labs on Mon and Thur - Will f/u ability to start po diet Herby Abraham, Pharm.D. [phone] 12/22/2012 7:56 AM

## 2012-12-22 NOTE — Op Note (Signed)
Preoperative diagnosis: cecal distention, persistent, possible anastomotic obstruction Postoperative diagnosis: anastomotic obstruction, cecal distention Procedure: 1. Exploratory laparotomy 2. Right colectomy with resection of anastomosis and long Hartmanns pouch 3. End ileostomy Surgeon: Dr Harden Mo Asst: Dr Darnell Level Anesthesia: general Estimated blood loss: Minimal Drains: None Complications: None Specimens: Right colon and anastomosis to pathology Disposition to PACU in stable condition Sponge needle count was correct at completion  Indications: This is an 79 year old female who underwent a left colectomy there was laparoscopic assisted by Dr. Luisa Hart over a couple of weeks ago. She has had some issues with what appeared to be a large bowel obstruction. I thought a couple of weeks ago this might be due to her anastomosis. She underwent a CT scan which showed dilated bowel going to her anastomosis but no evidence of a leak or any other problems. A Gastrograffin enema showed her to have a patent anastomosis. She remained with cecal distention and then I asked gastroenterology to scope her. The anastomosis appeared patent but was irregular and there was about a liter evacuated from the right colon. This has recurred again. She was tender today and we discussed going to the operating room for exploration which will with what would likely be a right colectomy and ileostomy.  Procedure: After informed consent was obtained the patient was taken to the operating room. She was given cefoxitin. Sequential compression devices were on her legs. She was placed under general anesthesia without complication. A Foley catheter was placed. Her abdomen was prepped and draped in the standard sterile surgical fashion. A surgical timeout was then performed.  I made a midline incision. This included part of her old incision. This was carried out down into the peritoneum without difficulty. Her small bowel  was noted to be dilated throughout down towards the cecum. Her cecum was very dilated as well with numerous serosal tears. I evaluated her anastomosis and it was patent but it felt like it was kinked as there was a significant band that was tacking it down to the retroperitoneum laterally. Medially this was splayed and I think this was functionally obstructed. I elected to resect the anastomosis as well as the right colon due to its appearance. It had not perforated yet but that was my concern. I then encircled the sigmoid colon and divided with the GIA stapler. I took this and then tacked it to the left side of the abdominal wall with 3-0 Prolene suture. I then proceeded to mobilize the cecum. I mobilize the hepatic flexure taking care to avoid the duodenum. Once I had this mobilized I then used a combination of the LigaSure device as well as silk ligatures on the vessels to remove this portion of the colon. I did divide the terminal ileum with a GIA stapler as well. Copious irrigation was then performed. I ran the bowel several times. There is no evidence of any injury. I then used cautery to create a defect in the right lateral abdominal wall and the peritoneum and brought the ileum through it. This was without difficulty. I then closed her incision with #1 looped PDS. I packed this with Kerlix gauze. I then proceeded to mature the ileostomy after covering the incision with 3-0 Vicryl suture. This looked well. I placed a stoma appliance on this. A dressing was placed. She tolerated this well as extubated and transferred to recovery.

## 2012-12-22 NOTE — Anesthesia Procedure Notes (Signed)
Procedure Name: Intubation Date/Time: 12/22/2012 3:40 PM Performed by: Reine Just Pre-anesthesia Checklist: Patient identified, Emergency Drugs available, Suction available, Patient being monitored and Timeout performed Patient Re-evaluated:Patient Re-evaluated prior to inductionOxygen Delivery Method: Circle system utilized and Simple face mask Preoxygenation: Pre-oxygenation with 100% oxygen Intubation Type: IV induction Ventilation: Mask ventilation without difficulty Laryngoscope Size: Miller and 2 Grade View: Grade II Tube type: Oral Tube size: 7.0 mm Number of attempts: 1 Airway Equipment and Method: Patient positioned with wedge pillow and Stylet Placement Confirmation: ETT inserted through vocal cords under direct vision,  positive ETCO2 and breath sounds checked- equal and bilateral Secured at: 22 cm Tube secured with: Tape Dental Injury: Teeth and Oropharynx as per pre-operative assessment

## 2012-12-22 NOTE — Progress Notes (Signed)
Patient ID: DAWNN NAM, female   DOB: 1929/04/27, 79 y.o.   MRN: [id number] Patient well known to me from postop course.  I agree with Dr Aura Camps assessment and plan.  She has failed to resolve and have been concerned there was issue at anastomosis although it is patent.  She continues to have cecal dilatation despite endoscopic therapy.  She is tender today.  Will proceed with laparotomy and likely colon resection and ileostomy at this point.  Discussed with patient.

## 2012-12-22 NOTE — Progress Notes (Addendum)
PT Cancellation Note  Patient Details Name: KATTALEYA ALIA MRN: [id number] DOB: [birth year]   Cancelled Treatment:    Reason Eval/Treat Not Completed: Other (comment) (pt. and husband report she is going for surgery later today) Surgeon in and plans for OR today.  Will sign off and will need reorders for PT if/when appropriate.   Ferman Hamming 12/22/2012, 12:47 PM Weldon Picking PT [facility] [phone] Bee[place] [phone]

## 2012-12-22 NOTE — Progress Notes (Signed)
7 Days Post-Op  Subjective: Patient remains awake, alert, oriented. Husband is here at bedside at this time. Patient remains distended and is having some pain, more on the right side of the abdomen.  Abdominal x-rays showed significant cecal dilatation, now 12-14 cm.Electrolytes, BUN and creatinine, mucosa were normal. She did not have a CBC today  Objective: Vital signs in last 24 hours: Temp:  [97.7 F (36.5 C)-98 F (36.7 C)] 98 F (36.7 C) (11/28 0519) Pulse Rate:  [77-94] 77 (11/28 0519) Resp:  [18] 18 (11/27 2151) BP: (124-187)/(59-93) 138/60 mmHg (11/28 0519) SpO2:  [98 %] 98 % (11/27 2151) Last BM Date: 12/22/12  Intake/Output from previous day:   Intake/Output this shift:    General appearance: alert. Oriented. Coherent. She understands the medical and surgical recommendations and understands the consequences of taking action and she also understands the consequences of not taking action. Chest wall: no tenderness, pacemaker left infraclavicular area. Lungs clear GI: abdomen significantly distended. Tender right side. Nontender on the left.  Lab Results:  No results found for this or any previous visit (from the past 24 hour(s)).   Studies/Results: @RISRSLT24 @  . acetaminophen  650 mg Rectal Q6H   Or  . acetaminophen  650 mg Oral Q6H  . pantoprazole (PROTONIX) IV  40 mg Intravenous Q12H     Assessment/Plan: s/p Procedure(s): COLONOSCOPY BOWEL DECOMPRESSION  Recurrent high grade partial colon obstruction, probably secondary to some type of mechanical problem around the anastomosis. Because of the degree of cecal dilatation and her tenderness, she is at risk for perforation and peritonitis. I do not think that further imaging or colonoscopy would be indicated. I have advised surgical reexploration today, laparotomy, probable right colectomy, stapling off of the distal colon, an end ileostomy as the safest approach. She understands ileostomy years. She understands  that she may have operation sternal muscle output her back together. She understands the consequences of perforation peritonitis and.  I discussed the indications, details, techniques, and numerous risks of the the surgery with the patient and her husband with the RN in attendance. She understands all these issues. All questions are answered. She is in full agreement with this plan.  Step down or ICU postop.  High risk due to to emergency surgery, frailty, advanced age, cardiac disease, Reoperative surgery.  @PROBHOSP @  LOS: 17 days    Tamara [name] M 12/22/2012  . .prob

## 2012-12-22 NOTE — Progress Notes (Signed)
[facility] PROGRESS NOTE Subjective Pt still has ileus and NG in place. She states that she is passing air and having stools. Xray shows diffuse SB distension and colonic distension with cecum 14 cm.  Objective: Vital signs in last 24 hours: Temp:  [97.7 F (36.5 C)-98 F (36.7 C)] 98 F (36.7 C) (11/28 0519) Pulse Rate:  [77-94] 77 (11/28 0519) Resp:  [18] 18 Jan 01, 2023 2151) BP: (124-187)/(59-93) 138/60 mmHg (11/28 0519) SpO2:  [98 %] 98 % 01/01/23 2151) Last BM Date: 12/22/12  Intake/Output from previous day:   Intake/Output this shift:    PE: General--somnulent  Heart-- Lungs-- Abdomen--distended tender few BSs  Lab Results: No results found for this basename: WBC, HGB, HCT, PLT,  in the last 72 hours BMET  Recent Labs  12/20/12 0510 12/31/2012 0600  NA 131* 130*  K 4.5 4.4  CL 99 99  CO2 24 23  CREATININE 0.48* 0.46*   LFT  Recent Labs  2012/12/31 0600  PROT 5.8*  AST 41*  ALT 41*  ALKPHOS 119*  BILITOT 0.2*   PT/INR No results found for this basename: LABPROT, INR,  in the last 72 hours PANCREAS No results found for this basename: LIPASE,  in the last 72 hours       Studies/Results: Dg Abd 2 Views  December 31, 2012   CLINICAL DATA:  Postop abdominal distention with diarrhea.  EXAM: ABDOMEN - 2 VIEW  COMPARISON:  Radiographs 12/19/2012 and 12/17/2012.  CT 12/12/2012.  FINDINGS: Nasogastric tube remains in the proximal stomach, only visualized on the decubitus view. Diffuse small bowel distention is again noted. There is progressive distention of the cecum, now measuring 14.4 cm transverse. Decubitus view demonstrates no free intraperitoneal air. Some gas is present within the rectum.  IMPRESSION: Progressive cecal distension status post distal colonic resection. Diffuse small bowel distension is unchanged. Based on the prior studies, the findings remain most consistent with an ileus, although stenosis at the colonic anastomosis cannot be excluded.    Electronically Signed   By: Roxy Horseman M.D.   On: December 31, 2012 13:04    Medications: I have reviewed the patient's current medications.  Assessment/Plan: 1. Diffuse SB and colonic ileus.  Unfortunately, cecum is increasing in size.  Pt had colonoscopy a few days ago negative for obstruction to the TI. I recommended another colonoscopy with decompression tube to her but she re fusses and states that she doesn't need it.  I will have them flush the NG to make sure it is working and place rectal tube to suction and get another KUB today.   Ayaansh Smail JR,Ameri Cahoon L 12/22/2012, 9:27 AM

## 2012-12-23 DIAGNOSIS — G8918 Other acute postprocedural pain: Secondary | ICD-10-CM

## 2012-12-23 DIAGNOSIS — K6389 Other specified diseases of intestine: Secondary | ICD-10-CM

## 2012-12-23 LAB — BASIC METABOLIC PANEL
CO2: 21 mEq/L (ref 19–32)
Calcium: 8.6 mg/dL (ref 8.4–10.5)
Chloride: 98 mEq/L (ref 96–112)
Creatinine, Ser: 0.41 mg/dL — ABNORMAL LOW (ref 0.50–1.10)
GFR calc Af Amer: >90 mL/min (ref >90)
GFR calc non Af Amer: >90 mL/min (ref >90)
Potassium: 4.4 mEq/L (ref 3.5–5.1)
Sodium: 128 mEq/L — ABNORMAL LOW (ref 135–145)

## 2012-12-23 LAB — CBC
MCH: 30.2 pg (ref 26.0–34.0)
MCHC: 33.8 g/dL (ref 30.0–36.0)
Platelets: 352 10*3/uL (ref 150–400)
RBC: 3.05 MIL/uL — ABNORMAL LOW (ref 3.87–5.11)

## 2012-12-23 MED ORDER — FAT EMULSION 20 % IV EMUL
250.0000 mL | INTRAVENOUS | Status: AC
  Administered 2012-12-23: 250 mL via INTRAVENOUS
  Filled 2012-12-23: qty 250

## 2012-12-23 MED ORDER — PROMETHAZINE HCL 25 MG/ML IJ SOLN
12.5000 mg | Freq: Four times a day (QID) | INTRAMUSCULAR | Status: DC | PRN
  Administered 2012-12-23 – 2013-01-01 (×16): 12.5 mg via INTRAVENOUS
  Filled 2012-12-23 (×15): qty 1

## 2012-12-23 MED ORDER — HYDROMORPHONE HCL PF 1 MG/ML IJ SOLN
0.5000 mg | INTRAMUSCULAR | Status: DC | PRN
  Administered 2012-12-23 (×2): 1 mg via INTRAVENOUS
  Administered 2012-12-23 (×2): 0.5 mg via INTRAVENOUS
  Administered 2012-12-24 – 2013-01-01 (×37): 1 mg via INTRAVENOUS
  Filled 2012-12-23 (×41): qty 1

## 2012-12-23 MED ORDER — HYDROMORPHONE HCL PF 1 MG/ML IJ SOLN
2.0000 mg | Freq: Once | INTRAMUSCULAR | Status: AC
  Administered 2012-12-23: 1 mg via INTRAVENOUS
  Filled 2012-12-23: qty 2

## 2012-12-23 MED ORDER — TRACE MINERALS CR-CU-F-FE-I-MN-MO-SE-ZN IV SOLN
INTRAVENOUS | Status: AC
  Administered 2012-12-23: 17:00:00 via INTRAVENOUS
  Filled 2012-12-23: qty 2000

## 2012-12-23 NOTE — Progress Notes (Signed)
Tamara [name] 10:14 AM  Subjective: Patient sleeping but her case was discussed with the surgical team and her husband and her operative note was reviewed  Objective: Vital signs stable afebrile labs stable patient not examined today sleeping comfortable  Assessment: Status post redo surgery for anastomotic dysfunction  Plan: Please let us know if we can help any further otherwise we'll follow her case on the computer and we wish her and her husband well  Cesar Rogerson E

## 2012-12-23 NOTE — Progress Notes (Addendum)
PARENTERAL NUTRITION CONSULT NOTE - Follow Up  Pharmacy Consult:  TPN Indication: Prolonged post-op ileus  Allergies  Allergen Reactions  . Codeine Nausea And Vomiting  . Zofran [Ondansetron Hcl]    Patient Measurements: Height: 5\' 5"  (165.1 cm) Weight: 136 lb 14.4 oz (62.097 kg) IBW/kg (Calculated) : 57  Vital Signs: Temp: 97.8 F (36.6 C) (11/29 0000) Temp src: Oral (11/29 0000) BP: 137/48 mmHg (11/29 0700) Pulse Rate: 101 (11/29 0700)  Labs:  Recent Labs  12/23/12 0405  WBC 12.5*  HGB 9.2*  HCT 27.2*  PLT 352    Recent Labs  12/21/12 0600 12/23/12 0405  NA 130* 128*  K 4.4 4.4  CL 99 98  CO2 23 21  GLUCOSE 110* 140*  BUN 18 21  CREATININE 0.46* 0.41*  CALCIUM 8.9 8.6  MG 2.1  --   PHOS 3.2  --   PROT 5.8*  --   ALBUMIN 2.6*  --   AST 41*  --   ALT 41*  --   ALKPHOS 119*  --   BILITOT 0.2*  --    Estimated Creatinine Clearance: 49.6 ml/min (by C-G formula based on Cr of 0.41).   Recent Labs  12/22/12 2039  GLUCAP 135*    Insulin Requirements in the past 24 hours:  None - SSI d/c'ed  Assessment:  62 YOF s/p lap-assisted transverse colectomy on 12/07/12 for partially obstruction transverse colon mass. TPN started for for post-op ileus. Pt back to OR 11/28 for exp lap/R colectomy/end ileostomy for functional obstruction of anastomosis.  GI: Intermittent nausea and severe abd pain/discomfort, s/p decompression for cecal dilatation 11/21. She has had multiple BMs. Noted back to OR 11/28 for exp lap/R colectomy/end ileostomy for functional obstruction of anastomosis. TG WNL, prealbumin remains stable at 17.  Endo: no hx DM - CBGs well controlled at goal TPN rate. SSI d/c'ed 11/22  Lytes:  Hyponatremia continues - Na down to 128. K+ 4.4 (goal ~4 for ileus), others WNL.  Renal: SCr stable, CrCL ~50 ml/min. I/O's inaccurate. NS with KCL at 35 ml/hr (provides ~37mEq KCL daily)  Pulm: RA  Cards: hx HTN. BP elevated, HR 69-101.(lisinopril  PTA). PRN hydralazine for SBP > 180  Hepatobil: LFTs slightly elevated.  Heme/Onc: hx breast cancer. Hgb low but stable. plts WNL.   Neuro: A&O - scheduled APAP  ID: afeb. WBC slightly elevated.   Best Practices: SQ heparin, SCDs  TPN Access: PICC placed 12/11/12  TPN day#: 12 (11/18 >> )  Current Nutrition:  Clinimix E 5/15 at 80 ml/hr + IVFE at 10 ml/hr providing 1843 kCal and 96 gm protein daily  Estimated Nutritional Needs:  1700 - 1900 kCal; 80-95 g protein daily  Plan:  - Continue Clinimix E 5/15 at 80 ml/hr + IVFE at 10 ml/hr meeting 100% of needs - Daily multivitamin and trace elements - F/u TPN labs on Mon and Myra Gianotti, PharmD, BCPS Clinical pharmacist, pager [phone] 12/23/2012 7:20 AM

## 2012-12-23 NOTE — Progress Notes (Signed)
Pt has refused turning/sitting up/getting OOB all day, despite pain being adequately controlled now. States "When I am hurting, there is no way I can move. When I am not hurting, I am so afraid of hurting again that I just cannot move. I am so comfortable right now--I am just fine the way I am." RN attempted to educate pt about complications of skin breakdown and pneumonia from immobility, but pt states "maybe I can do it tomorrow". Will monitor and reinforce.

## 2012-12-23 NOTE — Progress Notes (Signed)
Fentanyl PCA syringe at bedside, locked in IV pump, but not being used anymore. Wasted remaining Fentanyl from syringes. 42ml of 50ml wasted down sink and rinsed with Ernie Hew RN witnessing.

## 2012-12-23 NOTE — Progress Notes (Signed)
Subjective: Tamara [name] has been having persistent abdominal distention and dilation of the cecum to 14 cm. Due concern for obstruction at the anastomosis from recent resection she was taken to the OR by Dr. Dwain Sarna 11/28: Procedure:  1. Exploratory laparotomy  2. Right colectomy with resection of anastomosis and long Hartmanns pouch  3. End ileostomy  Per RN patient had a lot of pain overnight. After several doses of fentanyl meds were held 2/2 somnolence. This AM Tamara [name] is c/o severe pain.    Objective: Lab:  Recent Labs  12/23/12 0405  WBC 12.5*  HGB 9.2*  HCT 27.2*  MCV 89.2  PLT 352    Recent Labs  12/21/12 0600 12/23/12 0405  NA 130* 128*  K 4.4 4.4  CL 99 98  GLUCOSE 110* 140*  BUN 18 21  CREATININE 0.46* 0.41*  CALCIUM 8.9 8.6  MG 2.1  --   PHOS 3.2  --     Imaging: 11/28 @ 10 AM - KUB IMPRESSION:  Interim slight decrease in cecal size. Maximum transverse diameter  is now 13.5 cm. There is persistence signal and small bowel  distention suggesting adynamic ileus. Continued follow-up exams to  demonstrate clearing suggested. NG tube noted with tip projected  over stomach.  Scheduled Meds: . acetaminophen  650 mg Rectal Q6H   Or  . acetaminophen  650 mg Oral Q6H  . ertapenem  1 g Intravenous On Call to OR  . heparin subcutaneous  5,000 Units Subcutaneous Q8H  . pantoprazole (PROTONIX) IV  40 mg Intravenous Q12H   Continuous Infusions: . 0.9 % NaCl with KCl 40 mEq / L 35 mL/hr at 12/22/12 2209  . Marland KitchenTPN (CLINIMIX-E) Adult 80 mL/hr at 12/22/12 2146   And  . fat emulsion 250 mL (12/22/12 2147)  . Marland KitchenTPN (CLINIMIX-E) Adult     And  . fat emulsion     PRN Meds:.fentaNYL, hydrALAZINE, menthol-cetylpyridinium, phenol, promethazine, sodium chloride   Physical Exam: Filed Vitals:   12/23/12 0700  BP: 137/48  Pulse: 101  Temp:   Resp: 23   Gen'l - patient c/o pain, laying very still HEENT- normal, NG tube in place Cor - 2+ radial, quiet  precordium, RRR Pulm - no increased WOB, no wheeze Abd - ventral dressing dry, stoma RLQ appears normal. Very tender to minimal palpation, absent bowel sounds Neuro - awake, recognizes examiner.       Assessment/Plan: 1. GI POD #1 after ex lap and right colectomy w/ creation of iliostomy. Patient hemodynamically stable but in a lot of pain. No BS. Very tender. Plan Dilaudid 2 mg IV x 1 now  Care per surgery, including addressing her pain.  2. HTN - stable  3. F/E/N - to resume TNA   [employer] IM (o) [phone]; (c) [phone] Call-grp - Patsi Sears IM  Tele: [phone]  12/23/2012, 7:46 AM

## 2012-12-23 NOTE — Progress Notes (Signed)
Seen and agree  

## 2012-12-23 NOTE — Progress Notes (Signed)
1 Day Post-Op  Subjective: Pt arousable, but sedated.  Pain much better controlled with dilaudid.  No BM or flatus in ileostomy bag.  NPO with NG tube.    Objective: Vital signs in last 24 hours: Temp:  [97 F (36.1 C)-98.9 F (37.2 C)] 98.9 F (37.2 C) (11/29 0800) Pulse Rate:  [69-105] 101 (11/29 1100) Resp:  [14-28] 23 (11/29 1100) BP: (117-176)/(21-118) 145/59 mmHg (11/29 1100) SpO2:  [98 %-100 %] 100 % (11/29 1100) Last BM Date: 12/22/12  Intake/Output from previous day: 11/28 0701 - 11/29 0700 In: 9202.5 [I.V.:1497; NG/GT:30; EXB:2841.3] Out: 1600 [Urine:1050; Emesis/NG output:500; Blood:50] Intake/Output this shift: Total I/O In: 545 [I.V.:105; NG/GT:30; IV Piggyback:50; TPN:360] Out: 410 [Urine:210; Emesis/NG output:200]  PE: Gen:  Alert, NAD, pleasant Card:  RRR, no M/G/R heard Pulm:  CTA, no W/R/R Abd:  Soft, significantly tender, mild distension, diminished BS, no HSM, dressing change this am appears clean/dry, ileostomy pink with no flatus or BM yet Ext:  No erythema, edema, or tenderness  Lab Results:   Recent Labs  12/23/12 0405  WBC 12.5*  HGB 9.2*  HCT 27.2*  PLT 352   BMET  Recent Labs  12/21/12 0600 12/23/12 0405  NA 130* 128*  K 4.4 4.4  CL 99 98  CO2 23 21  GLUCOSE 110* 140*  BUN 18 21  CREATININE 0.46* 0.41*  CALCIUM 8.9 8.6   PT/INR No results found for this basename: LABPROT, INR,  in the last 72 hours CMP     Component Value Date/Time   NA 128* 12/23/2012 0405   K 4.4 12/23/2012 0405   CL 98 12/23/2012 0405   CO2 21 12/23/2012 0405   GLUCOSE 140* 12/23/2012 0405   BUN 21 12/23/2012 0405   CREATININE 0.41* 12/23/2012 0405   CALCIUM 8.6 12/23/2012 0405   PROT 5.8* 12/21/2012 0600   ALBUMIN 2.6* 12/21/2012 0600   AST 41* 12/21/2012 0600   ALT 41* 12/21/2012 0600   ALKPHOS 119* 12/21/2012 0600   BILITOT 0.2* 12/21/2012 0600   GFRNONAA >90 12/23/2012 0405   GFRAA >90 12/23/2012 0405   Lipase     Component Value  Date/Time   LIPASE 31 11/26/2012 1352       Studies/Results: Dg Abd 1 View  12/22/2012   CLINICAL DATA:  Cecal distention.  Prior colon surgery.  EXAM: ABDOMEN - 1 VIEW  COMPARISON:  Abdomen series 12/21/2012.  FINDINGS: Persistent cecal distention is present. Degree of cecal distention may be decreased slightly, maximum transverse diameter is now 13.5 cm. There is associated moderate small bowel distention. These findings together suggest adynamic ileus. No free air noted. NG tube noted with tip projected over stomach. Cardiac pacer leads noted. Surgical clips right upper quadrant consistent with prior cholecystectomy. Degenerative changes that osteopenia lumbar spine and both hips.  IMPRESSION: Interim slight decrease in cecal size. Maximum transverse diameter is now 13.5 cm. There is persistence signal and small bowel distention suggesting adynamic ileus. Continued follow-up exams to demonstrate clearing suggested. NG tube noted with tip projected over stomach.   Electronically Signed   By: Maisie Fus  Register   On: 12/22/2012 10:23   Dg Abd 2 Views  12/21/2012   CLINICAL DATA:  Postop abdominal distention with diarrhea.  EXAM: ABDOMEN - 2 VIEW  COMPARISON:  Radiographs 12/19/2012 and 12/17/2012.  CT 12/12/2012.  FINDINGS: Nasogastric tube remains in the proximal stomach, only visualized on the decubitus view. Diffuse small bowel distention is again noted. There is progressive distention of the  cecum, now measuring 14.4 cm transverse. Decubitus view demonstrates no free intraperitoneal air. Some gas is present within the rectum.  IMPRESSION: Progressive cecal distension status post distal colonic resection. Diffuse small bowel distension is unchanged. Based on the prior studies, the findings remain most consistent with an ileus, although stenosis at the colonic anastomosis cannot be excluded.   Electronically Signed   By: Roxy Horseman M.D.   On: 12/21/2012 13:04    Anti-infectives: Anti-infectives    Start     Dose/Rate Route Frequency Ordered Stop   12/23/12 0600  ertapenem (INVANZ) 1 g in sodium chloride 0.9 % 50 mL IVPB     1 g 100 mL/hr over 30 Minutes Intravenous On call to O.R. 12/22/12 2037 12/23/12 0903   12/22/12 1615  cefOXitin (MEFOXIN) 2 g in dextrose 5 % 50 mL IVPB  Status:  Discontinued     2 g 100 mL/hr over 30 Minutes Intravenous  Once 12/22/12 1601 12/22/12 2032   12/07/12 0600  cefoTEtan (CEFOTAN) 2 g in dextrose 5 % 50 mL IVPB     2 g 100 mL/hr over 30 Minutes Intravenous On call to O.R. 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  Change to IV dilaudid as she has better control with that 2.  Cont  IVF, pain control, antiemetics 3.  Cont NG tube and NPO until bowel function returns 4.  Mobilize today as tolerated, IS, SCD's, and heparin 5.  Cont SDU for now and closely monitor sedation  Discussed care with husband at bedside.    LOS: 18 days    DORT, Johnnye Sandford 12/23/2012, 12:01 PM Pager: [phone]

## 2012-12-23 NOTE — Progress Notes (Signed)
Pt educated on need to turn, increase mobility, do IS, and basic hygiene. All refused. Pt asked me to stop talking.

## 2012-12-24 DIAGNOSIS — F411 Generalized anxiety disorder: Secondary | ICD-10-CM

## 2012-12-24 LAB — CBC WITH DIFFERENTIAL/PLATELET
Basophils Absolute: 0 10*3/uL (ref 0.0–0.1)
Lymphocytes Relative: 8 % — ABNORMAL LOW (ref 12–46)
Lymphs Abs: 1.1 10*3/uL (ref 0.7–4.0)
Neutrophils Relative %: 78 % — ABNORMAL HIGH (ref 43–77)
Platelets: 335 10*3/uL (ref 150–400)
RBC: 2.79 MIL/uL — ABNORMAL LOW (ref 3.87–5.11)
WBC: 13.7 10*3/uL — ABNORMAL HIGH (ref 4.0–10.5)

## 2012-12-24 LAB — BASIC METABOLIC PANEL
BUN: 23 mg/dL (ref 6–23)
Calcium: 9.1 mg/dL (ref 8.4–10.5)
GFR calc Af Amer: >90 mL/min (ref >90)
GFR calc non Af Amer: >90 mL/min (ref >90)
Sodium: 125 mEq/L — ABNORMAL LOW (ref 135–145)

## 2012-12-24 MED ORDER — LORAZEPAM 2 MG/ML IJ SOLN
0.5000 mg | Freq: Four times a day (QID) | INTRAMUSCULAR | Status: DC | PRN
  Administered 2012-12-24 – 2013-01-01 (×6): 0.5 mg via INTRAVENOUS
  Filled 2012-12-24 (×6): qty 1

## 2012-12-24 MED ORDER — TRACE MINERALS CR-CU-F-FE-I-MN-MO-SE-ZN IV SOLN
INTRAVENOUS | Status: AC
  Administered 2012-12-24: 17:00:00 via INTRAVENOUS
  Filled 2012-12-24: qty 2000

## 2012-12-24 MED ORDER — WHITE PETROLATUM GEL
Status: DC | PRN
  Filled 2012-12-24: qty 5

## 2012-12-24 MED ORDER — FAT EMULSION 20 % IV EMUL
250.0000 mL | INTRAVENOUS | Status: AC
  Administered 2012-12-24: 250 mL via INTRAVENOUS
  Filled 2012-12-24: qty 250

## 2012-12-24 MED ORDER — SODIUM CHLORIDE 0.9 % IV SOLN
INTRAVENOUS | Status: DC
  Administered 2012-12-24: 10:00:00 via INTRAVENOUS
  Administered 2012-12-26: 20 mL/h via INTRAVENOUS
  Administered 2012-12-28 – 2012-12-31 (×3): via INTRAVENOUS

## 2012-12-24 NOTE — Progress Notes (Signed)
2 Days Post-Op  Subjective: Alert. Hemodynamically stable.Anxiety less. Pain under better control. Good urine output. NG drainage about 1 L in 24 hours. Has not been out of bed yet, but going to try today.  Hemoglobin 8.6. WBC 13,700. Creatinine 0.42. Sodium 125. Potassium 5.0.  Objective: Vital signs in last 24 hours: Temp:  [97.2 F (36.2 C)-97.8 F (36.6 C)] 97.8 F (36.6 C) (11/30 0815) Pulse Rate:  [93-110] 102 (11/30 0900) Resp:  [10-27] 15 (11/30 0900) BP: (108-145)/(47-104) 132/50 mmHg (11/30 0800) SpO2:  [97 %-100 %] 100 % (11/30 0900) Weight:  [135 lb (61.236 kg)] 135 lb (61.236 kg) (11/30 0600) Last BM Date: 12/22/12  Intake/Output from previous day: 11/29 0701 - 11/30 0700 In: 3100 [I.V.:770; NG/GT:120; IV Piggyback:50; TPN:2160] Out: 1938 [Urine:938; Emesis/NG output:1000] Intake/Output this shift: Total I/O In: 120 [NG/GT:30; TPN:90] Out: 100 [Urine:100]    EXAM: General appearance: alert. Oriented. Mental status normal. Mild to moderate distress during dressing changes. Resp: clear to auscultation bilaterally GI: abdomen slightly distended. Appropriately tender. Silent. Midline wound clean and fascia intact. Packed open. Ileostomy pink and healthy. Minimal output.  Lab Results:  Results for orders placed during the hospital encounter of 12/05/12 (from the past 24 hour(s))  BASIC METABOLIC PANEL     Status: Abnormal   Collection Time    12/24/12  4:00 AM      Result Value Range   Sodium 125 (*) 135 - 145 mEq/L   Potassium 5.0  3.5 - 5.1 mEq/L   Chloride 96  96 - 112 mEq/L   CO2 22  19 - 32 mEq/L   Glucose, Bld 122 (*) 70 - 99 mg/dL   BUN 23  6 - 23 mg/dL   Creatinine, Ser 1.47 (*) 0.50 - 1.10 mg/dL   Calcium 9.1  8.4 - 82.9 mg/dL   GFR calc non Af Amer >90  >90 mL/min   GFR calc Af Amer >90  >90 mL/min  CBC WITH DIFFERENTIAL     Status: Abnormal   Collection Time    12/24/12  4:00 AM      Result Value Range   WBC 13.7 (*) 4.0 - 10.5 K/uL   RBC  2.79 (*) 3.87 - 5.11 MIL/uL   Hemoglobin 8.6 (*) 12.0 - 15.0 g/dL   HCT 56.2 (*) 13.0 - 86.5 %   MCV 89.6  78.0 - 100.0 fL   MCH 30.8  26.0 - 34.0 pg   MCHC 34.4  30.0 - 36.0 g/dL   RDW 78.4 (*) 69.6 - 29.5 %   Platelets 335  150 - 400 K/uL   Neutrophils Relative % 78 (*) 43 - 77 %   Neutro Abs 10.6 (*) 1.7 - 7.7 K/uL   Lymphocytes Relative 8 (*) 12 - 46 %   Lymphs Abs 1.1  0.7 - 4.0 K/uL   Monocytes Relative 13 (*) 3 - 12 %   Monocytes Absolute 1.8 (*) 0.1 - 1.0 K/uL   Eosinophils Relative 1  0 - 5 %   Eosinophils Absolute 0.1  0.0 - 0.7 K/uL   Basophils Relative 0  0 - 1 %   Basophils Absolute 0.0  0.0 - 0.1 K/uL     Studies/Results: @RISRSLT24 @  . acetaminophen  650 mg Rectal Q6H   Or  . acetaminophen  650 mg Oral Q6H  . heparin subcutaneous  5,000 Units Subcutaneous Q8H  . 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 Continue NG tube, Foley, n.p.o., TNA Mobilize out of bed May go to SDU. No pulmonary problems. TNA labs tomorrow.   @PROBHOSP @  LOS: 19 days    Caydon Feasel M. Derrell Lolling, M.D., [facility] General and Minimally invasive Surgery Breast and Colorectal Surgery Office:   [phone] Pager:   [phone]  12/24/2012  . .prob

## 2012-12-24 NOTE — Progress Notes (Signed)
PARENTERAL NUTRITION CONSULT NOTE - Follow Up  Pharmacy Consult:  TPN Indication: Prolonged post-op ileus  Allergies  Allergen Reactions  . Codeine Nausea And Vomiting  . Zofran [Ondansetron Hcl]    Patient Measurements: Height: 5\' 5"  (165.1 cm) Weight: 135 lb (61.236 kg) IBW/kg (Calculated) : 57  Vital Signs: Temp: 97.6 F (36.4 C) (11/30 0414) Temp src: Oral (11/30 0414) BP: 127/49 mmHg (11/30 0700) Pulse Rate: 97 (11/30 0700)  Labs:  Recent Labs  12/23/12 0405 12/24/12 0400  WBC 12.5* 13.7*  HGB 9.2* 8.6*  HCT 27.2* 25.0*  PLT 352 335    Recent Labs  12/23/12 0405 12/24/12 0400  NA 128* 125*  K 4.4 5.0  CL 98 96  CO2 21 22  GLUCOSE 140* 122*  BUN 21 23  CREATININE 0.41* 0.42*  CALCIUM 8.6 9.1   Estimated Creatinine Clearance: 49.6 ml/min (by C-G formula based on Cr of 0.42).   Recent Labs  12/22/12 2039  GLUCAP 135*    Insulin Requirements in the past 24 hours:  None - SSI d/c'ed  Assessment:  38 YOF s/p lap-assisted transverse colectomy on 12/07/12 for partially obstruction transverse colon mass. TPN started for for post-op ileus. Pt back to OR 11/28 for exp lap/R colectomy/end ileostomy for functional obstruction of anastomosis.  GI: Intermittent nausea and severe abd pain/discomfort, s/p decompression for cecal dilatation 11/21. She has had multiple BMs. Noted back to OR 11/28 for exp lap/R colectomy/end ileostomy for functional obstruction of anastomosis. TG WNL, prealbumin remains stable at 17. NGT with 1000 out yesterday. Remains NPO.  Endo: no hx DM - CBGs well controlled at goal TPN rate. SSI d/c'ed 11/22  Lytes:  Hyponatremia continues - Na down to 125. K+ 5 (goal >4 for ileus) - trending up - will take out of MIVF. others WNL.  Renal: SCr stable, CrCL ~50 ml/min. Positive 1L yesterday which may be contributing to low Na. NS with KCL at 35 ml/hr (provides ~39mEq KCL daily)  Pulm: RA  Cards: hx HTN. VSS.(lisinopril PTA). PRN  hydralazine for SBP > 180  Hepatobil: LFTs slightly elevated.  Heme/Onc: hx breast cancer. Hgb low but stable. plts WNL.   Neuro: A&O - scheduled APAP  ID: afeb. WBC slightly elevated.   Best Practices: SQ heparin, SCDs  TPN Access: PICC placed 12/11/12  TPN day#: 13 (11/18 >> )  Current Nutrition:  Clinimix E 5/15 at 80 ml/hr + IVFE at 10 ml/hr providing 1843 kCal and 96 gm protein daily  Estimated Nutritional Needs:  1700 - 1900 kCal; 80-95 g protein daily  Plan:  - Continue Clinimix E 5/15 at 80 ml/hr + IVFE at 10 ml/hr meeting 100% of needs - Change MIVF to NS at [facility]. - F/u TPN labs  - Will continue to watch Na, if remains <128 x 7 days or pt becomes symptomatic; will need to add extra sodium to TPN bag.  Christoper Fabian, PharmD, BCPS Clinical pharmacist, pager [phone] 12/24/2012 7:30 AM

## 2012-12-24 NOTE — Progress Notes (Signed)
Subjective: Pain has been better controlled per notes. She is still not participating in any movement for fear of pain and problems with her wound. Per RN there is a degree of anxiety that needs to be addressed.   She is awake and alert to examiner. Denies any active pain. Admits to being very tense due in part to the anticipation of pain.   Objective: Lab:  Recent Labs  12/23/12 0405 12/24/12 0400  WBC 12.5* 13.7*  NEUTROABS  --  10.6*  HGB 9.2* 8.6*  HCT 27.2* 25.0*  MCV 89.2 89.6  PLT 352 335    Recent Labs  12/23/12 0405 12/24/12 0400  NA 128* 125*  K 4.4 5.0  CL 98 96  GLUCOSE 140* 122*  BUN 21 23  CREATININE 0.41* 0.42*  CALCIUM 8.6 9.1    Imaging: no new imaging  Scheduled Meds: . acetaminophen  650 mg Rectal Q6H   Or  . acetaminophen  650 mg Oral Q6H  . heparin subcutaneous  5,000 Units Subcutaneous Q8H  . pantoprazole (PROTONIX) IV  40 mg Intravenous Q12H   Continuous Infusions: . sodium chloride    . Marland KitchenTPN (CLINIMIX-E) Adult 80 mL/hr at 12/23/12 1900   And  . fat emulsion 250 mL (12/23/12 1900)  . Marland KitchenTPN (CLINIMIX-E) Adult     And  . fat emulsion     PRN Meds:.hydrALAZINE, HYDROmorphone (DILAUDID) injection, menthol-cetylpyridinium, phenol, promethazine, sodium chloride, white petrolatum   Physical Exam: Filed Vitals:   12/24/12 0900  BP: 117/57 to 132/50  Pulse: 102  Temp: 97.8  Resp: 15   Gen'l- elderly woman who does not appear to be in distress HEENT- normal, NG tube in place, O2 nasal canula in place Cor 2+ radial, RRR Pulm - no increased WOB, no rales or wheezes Abd- absent BS, diffusely tender to minimal percussion, Dressing dry. Neuro - awake, speech clear, cogent      Assessment/Plan: 1. GI - PID #2 /p colectomy. Hemodynamically stable. Pain better controlled. Mild leukocytosis. Plan F/u CBCD  2. HTN- stable with occasional soft reading.  3. F/E/N/ - TNA per pharmacy  4. Anxiety - patient very tense, fearful. She may be  taking anticipatory does of dilaudid. Plan Add low dose lorazepam for anxiety - watch for sedation.    Illene Regulus [place] [facility] (o) [phone]; (c) [phone] Call-grp - [facility]  Tele: [phone]  12/24/2012, 9:04 AM

## 2012-12-25 LAB — COMPREHENSIVE METABOLIC PANEL
AST: 25 U/L (ref 0–37)
Albumin: 1.9 g/dL — ABNORMAL LOW (ref 3.5–5.2)
BUN: 19 mg/dL (ref 6–23)
Calcium: 8.7 mg/dL (ref 8.4–10.5)
Creatinine, Ser: 0.42 mg/dL — ABNORMAL LOW (ref 0.50–1.10)
Total Bilirubin: 0.2 mg/dL — ABNORMAL LOW (ref 0.3–1.2)

## 2012-12-25 LAB — TRIGLYCERIDES: Triglycerides: 75 mg/dL (ref <150)

## 2012-12-25 LAB — DIFFERENTIAL
Basophils Absolute: 0 10*3/uL (ref 0.0–0.1)
Eosinophils Absolute: 0.3 10*3/uL (ref 0.0–0.7)
Eosinophils Relative: 3 % (ref 0–5)
Lymphocytes Relative: 13 % (ref 12–46)
Lymphs Abs: 1.1 10*3/uL (ref 0.7–4.0)
Monocytes Relative: 13 % — ABNORMAL HIGH (ref 3–12)
Neutrophils Relative %: 71 % (ref 43–77)

## 2012-12-25 LAB — PHOSPHORUS: Phosphorus: 2.9 mg/dL (ref 2.3–4.6)

## 2012-12-25 LAB — MAGNESIUM: Magnesium: 1.9 mg/dL (ref 1.5–2.5)

## 2012-12-25 LAB — PREALBUMIN: Prealbumin: 11.7 mg/dL — ABNORMAL LOW (ref 17.0–34.0)

## 2012-12-25 MED ORDER — FAT EMULSION 20 % IV EMUL
250.0000 mL | INTRAVENOUS | Status: AC
  Administered 2012-12-25: 250 mL via INTRAVENOUS
  Filled 2012-12-25: qty 250

## 2012-12-25 MED ORDER — TRACE MINERALS CR-CU-F-FE-I-MN-MO-SE-ZN IV SOLN
INTRAVENOUS | Status: AC
  Administered 2012-12-25 (×3): via INTRAVENOUS
  Filled 2012-12-25: qty 2000

## 2012-12-25 NOTE — Progress Notes (Signed)
Agree with A&P of MD,PA. Patient appears better than yesterday. No BS when I listened. Seems comfortable

## 2012-12-25 NOTE — Progress Notes (Signed)
Subjective: Patient much better today. Much more comfortable overall. Anxiety well controlled. Starting to produce stool from ileostomy   Objective: Weight change: -0.136 kg (-4.8 oz)  Intake/Output Summary (Last 24 hours) at 12/25/12 1949 Last data filed at 12/25/12 1900  Gross per 24 hour  Intake   2730 ml  Output   1945 ml  Net    785 ml   Filed Vitals:   12/25/12 1556 12/25/12 1600 12/25/12 1700 12/25/12 1800  BP:  92/51 112/47 112/46  Pulse:  100 96 103  Temp: 98.6 F (37 C)     TempSrc: Oral     Resp:  11 14 16   Height:      Weight:      SpO2:  97% 98% 100%   PE:  Gen: Alert, NAD, pleasant  Abd: Soft, moderate distension, +/- BS, dressing, C/D/I   Lab Results: Results for orders placed during the hospital encounter of 12/05/12 (from the past 48 hour(s))  BASIC METABOLIC PANEL     Status: Abnormal   Collection Time    12/24/12  4:00 AM      Result Value Range   Sodium 125 (*) 135 - 145 mEq/L   Potassium 5.0  3.5 - 5.1 mEq/L   Chloride 96  96 - 112 mEq/L   CO2 22  19 - 32 mEq/L   Glucose, Bld 122 (*) 70 - 99 mg/dL   BUN 23  6 - 23 mg/dL   Creatinine, Ser 0.45 (*) 0.50 - 1.10 mg/dL   Calcium 9.1  8.4 - 40.9 mg/dL   GFR calc non Af Amer >90  >90 mL/min   GFR calc Af Amer >90  >90 mL/min   Comment: (NOTE)     The eGFR has been calculated using the CKD EPI equation.     This calculation has not been validated in all clinical situations.     eGFR's persistently <90 mL/min signify possible Chronic Kidney     Disease.  CBC WITH DIFFERENTIAL     Status: Abnormal   Collection Time    12/24/12  4:00 AM      Result Value Range   WBC 13.7 (*) 4.0 - 10.5 K/uL   RBC 2.79 (*) 3.87 - 5.11 MIL/uL   Hemoglobin 8.6 (*) 12.0 - 15.0 g/dL   HCT 81.1 (*) 91.4 - 78.2 %   MCV 89.6  78.0 - 100.0 fL   MCH 30.8  26.0 - 34.0 pg   MCHC 34.4  30.0 - 36.0 g/dL   RDW 95.6 (*) 21.3 - 08.6 %   Platelets 335  150 - 400 K/uL   Neutrophils Relative % 78 (*) 43 - 77 %   Neutro Abs 10.6  (*) 1.7 - 7.7 K/uL   Lymphocytes Relative 8 (*) 12 - 46 %   Lymphs Abs 1.1  0.7 - 4.0 K/uL   Monocytes Relative 13 (*) 3 - 12 %   Monocytes Absolute 1.8 (*) 0.1 - 1.0 K/uL   Eosinophils Relative 1  0 - 5 %   Eosinophils Absolute 0.1  0.0 - 0.7 K/uL   Basophils Relative 0  0 - 1 %   Basophils Absolute 0.0  0.0 - 0.1 K/uL  COMPREHENSIVE METABOLIC PANEL     Status: Abnormal   Collection Time    12/25/12  4:35 AM      Result Value Range   Sodium 129 (*) 135 - 145 mEq/L   Potassium 4.3  3.5 - 5.1 mEq/L  Chloride 98  96 - 112 mEq/L   CO2 25  19 - 32 mEq/L   Glucose, Bld 112 (*) 70 - 99 mg/dL   BUN 19  6 - 23 mg/dL   Creatinine, Ser 2.13 (*) 0.50 - 1.10 mg/dL   Calcium 8.7  8.4 - 08.6 mg/dL   Total Protein 5.0 (*) 6.0 - 8.3 g/dL   Albumin 1.9 (*) 3.5 - 5.2 g/dL   AST 25  0 - 37 U/L   ALT 42 (*) 0 - 35 U/L   Alkaline Phosphatase 123 (*) 39 - 117 U/L   Total Bilirubin 0.2 (*) 0.3 - 1.2 mg/dL   GFR calc non Af Amer >90  >90 mL/min   GFR calc Af Amer >90  >90 mL/min   Comment: (NOTE)     The eGFR has been calculated using the CKD EPI equation.     This calculation has not been validated in all clinical situations.     eGFR's persistently <90 mL/min signify possible Chronic Kidney     Disease.  MAGNESIUM     Status: None   Collection Time    12/25/12  4:35 AM      Result Value Range   Magnesium 1.9  1.5 - 2.5 mg/dL  PHOSPHORUS     Status: None   Collection Time    12/25/12  4:35 AM      Result Value Range   Phosphorus 2.9  2.3 - 4.6 mg/dL  DIFFERENTIAL     Status: Abnormal   Collection Time    12/25/12  4:35 AM      Result Value Range   Neutrophils Relative % 71  43 - 77 %   Neutro Abs 6.2  1.7 - 7.7 K/uL   Lymphocytes Relative 13  12 - 46 %   Lymphs Abs 1.1  0.7 - 4.0 K/uL   Monocytes Relative 13 (*) 3 - 12 %   Monocytes Absolute 1.2 (*) 0.1 - 1.0 K/uL   Eosinophils Relative 3  0 - 5 %   Eosinophils Absolute 0.3  0.0 - 0.7 K/uL   Basophils Relative 0  0 - 1 %    Basophils Absolute 0.0  0.0 - 0.1 K/uL  PREALBUMIN     Status: Abnormal   Collection Time    12/25/12  4:35 AM      Result Value Range   Prealbumin 11.7 (*) 17.0 - 34.0 mg/dL   Comment: Performed at [facility]  TRIGLYCERIDES     Status: None   Collection Time    12/25/12  4:35 AM      Result Value Range   Triglycerides 75  <150 mg/dL    Studies/Results: No results found. Medications: Scheduled Meds: . acetaminophen  650 mg Rectal Q6H   Or  . acetaminophen  650 mg Oral Q6H  . heparin subcutaneous  5,000 Units Subcutaneous Q8H  . pantoprazole (PROTONIX) IV  40 mg Intravenous Q12H   Continuous Infusions: . sodium chloride 20 mL/hr at 12/24/12 1000  . Marland KitchenTPN (CLINIMIX-E) Adult 80 mL/hr at 12/25/12 1706   And  . fat emulsion 250 mL (12/25/12 1704)   PRN Meds:.hydrALAZINE, HYDROmorphone (DILAUDID) injection, LORazepam, menthol-cetylpyridinium, phenol, promethazine, sodium chloride, white petrolatum  Assessment/Plan:  As per surgery and will offer ice chips and sips    LOS: 20 days   Pearla Dubonnet, MD 12/25/2012, 7:49 PM

## 2012-12-25 NOTE — Progress Notes (Addendum)
CSW continuing to follow patient for possible snf once medically ready.  Maree Krabbe, MSW, [facility] [phone]

## 2012-12-25 NOTE — Anesthesia Postprocedure Evaluation (Signed)
  Anesthesia Post-op Note  Patient: Tamara [name]  Procedure(s) Performed: Procedure(s): EXPLORATORY LAPAROTOMY , RIGHT Colectomy IILEOSTOMY (N/A)  Patient Location: PACU  Anesthesia Type:General  Level of Consciousness: awake, alert  and oriented  Airway and Oxygen Therapy: Patient Spontanous Breathing  Post-op Pain: mild  Post-op Assessment: Post-op Vital signs reviewed  Post-op Vital Signs: Reviewed  Complications: No apparent anesthesia complications

## 2012-12-25 NOTE — Progress Notes (Signed)
3 Days Post-Op  Subjective: Pt doing much better.  Still sore, but pain better controlled.  Anxiety medication helping her relax.  Ambulated OOB and working on leg exercises.  Using IS.  Has had loose stools in ileostomy bag and some flatus.  Thirsty.  Objective: Vital signs in last 24 hours: Temp:  [97.5 F (36.4 C)-97.8 F (36.6 C)] 97.5 F (36.4 C) (11/30 2100) Pulse Rate:  [95-114] 114 (12/01 0700) Resp:  [11-39] 39 (12/01 0700) BP: (100-146)/(36-59) 124/41 mmHg (12/01 0600) SpO2:  [96 %-100 %] 97 % (12/01 0700) Weight:  [134 lb 11.2 oz (61.1 kg)] 134 lb 11.2 oz (61.1 kg) (12/01 0630) Last BM Date: 12/22/12  Intake/Output from previous day: 11/30 0701 - 12/01 0700 In: 2780 [I.V.:440; NG/GT:180; TPN:2160] Out: 2185 [Urine:1345; Emesis/NG output:700; Stool:140] Intake/Output this shift:    PE: Gen:  Alert, NAD, pleasant Abd: Soft, ND, moderate distension, few BS, no HSM, dressing, C/D/I   Lab Results:   Recent Labs  12/23/12 0405 12/24/12 0400  WBC 12.5* 13.7*  HGB 9.2* 8.6*  HCT 27.2* 25.0*  PLT 352 335   BMET  Recent Labs  12/24/12 0400 12/25/12 0435  NA 125* 129*  K 5.0 4.3  CL 96 98  CO2 22 25  GLUCOSE 122* 112*  BUN 23 19  CREATININE 0.42* 0.42*  CALCIUM 9.1 8.7   PT/INR No results found for this basename: LABPROT, INR,  in the last 72 hours CMP     Component Value Date/Time   NA 129* 12/25/2012 0435   K 4.3 12/25/2012 0435   CL 98 12/25/2012 0435   CO2 25 12/25/2012 0435   GLUCOSE 112* 12/25/2012 0435   BUN 19 12/25/2012 0435   CREATININE 0.42* 12/25/2012 0435   CALCIUM 8.7 12/25/2012 0435   PROT 5.0* 12/25/2012 0435   ALBUMIN 1.9* 12/25/2012 0435   AST 25 12/25/2012 0435   ALT 42* 12/25/2012 0435   ALKPHOS 123* 12/25/2012 0435   BILITOT 0.2* 12/25/2012 0435   GFRNONAA >90 12/25/2012 0435   GFRAA >90 12/25/2012 0435   Lipase     Component Value Date/Time   LIPASE 31 11/26/2012 1352       Studies/Results: No results  found.  Anti-infectives: Anti-infectives   Start     Dose/Rate Route Frequency Ordered Stop   12/23/12 0600  ertapenem (INVANZ) 1 g in sodium chloride 0.9 % 50 mL IVPB     1 g 100 mL/hr over 30 Minutes Intravenous On call to O.R. 12/22/12 2037 12/23/12 0903   12/22/12 1615  cefOXitin (MEFOXIN) 2 g in dextrose 5 % 50 mL IVPB  Status:  Discontinued     2 g 100 mL/hr over 30 Minutes Intravenous  Once 12/22/12 1601 12/22/12 2032   12/07/12 0600  cefoTEtan (CEFOTAN) 2 g in dextrose 5 % 50 mL IVPB     2 g 100 mL/hr over 30 Minutes Intravenous On call to O.R. 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 Ileus starting to resolve,  had some stool/gas in ileostomy bag ( ) and NG output was less at 727mL/24 hours 2. Cont IVF, pain control, antiemetics  3. Clamp NG tube and check residuals for 24 hours given return of bowel function 4. Mobilize today, IS, SCD's, and heparin  5. Transfer to medsurg floor (6N) if okay with Dr. Kevan Ny  Discussed care with husband at bedside.     LOS: 20 days    DORT, Aundra Millet 12/25/2012, 7:40 AM Pager: [phone]

## 2012-12-25 NOTE — Progress Notes (Signed)
PARENTERAL NUTRITION CONSULT NOTE - Follow Up  Pharmacy Consult:  TPN Indication: Prolonged post-op ileus  Allergies  Allergen Reactions  . Codeine Nausea And Vomiting  . Zofran [Ondansetron Hcl]    Patient Measurements: Height: 5\' 5"  (165.1 cm) Weight: 134 lb 11.2 oz (61.1 kg) IBW/kg (Calculated) : 57  Vital Signs: Temp: 97.5 F (36.4 C) (11/30 2100) Temp src: Oral (11/30 2100) BP: 124/41 mmHg (12/01 0600) Pulse Rate: 114 (12/01 0700)  Labs:  Recent Labs  12/23/12 0405 12/24/12 0400  WBC 12.5* 13.7*  HGB 9.2* 8.6*  HCT 27.2* 25.0*  PLT 352 335    Recent Labs  12/23/12 0405 12/24/12 0400 12/25/12 0435  NA 128* 125* 129*  K 4.4 5.0 4.3  CL 98 96 98  CO2 21 22 25   GLUCOSE 140* 122* 112*  BUN 21 23 19   CREATININE 0.41* 0.42* 0.42*  CALCIUM 8.6 9.1 8.7  MG  --   --  1.9  PHOS  --   --  2.9  PROT  --   --  5.0*  ALBUMIN  --   --  1.9*  AST  --   --  25  ALT  --   --  42*  ALKPHOS  --   --  123*  BILITOT  --   --  0.2*  TRIG  --   --  75   Estimated Creatinine Clearance: 49.6 ml/min (by C-G formula based on Cr of 0.42).   Recent Labs  12/22/12 2039  GLUCAP 135*    Insulin Requirements in the past 24 hours:  None - SSI d/c'ed  Assessment:  76 YOF s/p lap-assisted transverse colectomy on 12/07/12 for partially obstruction transverse colon mass. TPN started for for post-op ileus. Pt back to OR 11/28 for exp lap/R colectomy/end ileostomy for functional obstruction of anastomosis.  GI: Intermittent nausea and severe abd pain/discomfort, s/p decompression for cecal dilatation 11/21. She has had multiple BMs. Noted back to OR 11/28 for exp lap/R colectomy/end ileostomy for functional obstruction of anastomosis. TG WNL, prealbumin remains stable at 17. NGT with 450 out. Remains NPO.  Endo: no hx DM - CBGs well controlled at goal TPN rate. SSI d/c'ed 11/22  Lytes:  Hyponatremia continues - Na now 129. K+ 4.3 (goal >4 for ileus) Phos 2.9, Mag 1.9  Renal:  SCr stable, CrCL ~50 ml/min. NS  At [facility]  Pulm: RA  Cards: hx HTN. BP soft .(lisinopril PTA). PRN hydralazine for SBP > 180  Hepatobil: LFTs slightly elevated.  TG 75 Alb 1.9, prealb pending  Heme/Onc: hx breast cancer. Hgb low but stable. plts WNL.   Neuro: A&O - scheduled APAP  ID: afeb. WBC slightly elevated.   Best Practices: SQ heparin, SCDs  TPN Access: PICC placed 12/11/12  TPN day#: 13 (11/18 >> )  Current Nutrition:  Clinimix E 5/15 at 80 ml/hr + IVFE at 10 ml/hr providing 1843 kCal and 96 gm protein daily  Estimated Nutritional Needs:  1700 - 1900 kCal; 80-95 g protein daily  Plan:  - Continue Clinimix E 5/15 at 80 ml/hr + IVFE at 10 ml/hr meeting 100% of needs - F/u TPN labs  - Will continue to watch Na, if remains <128 x 7 days or pt becomes symptomatic; will need to add extra sodium to TPN bag.  Thanks for allowing pharmacy to be a part of this patient's care.  Talbert Cage, PharmD Clinical Pharmacist, [phone]  12/25/2012 7:27 AM

## 2012-12-26 ENCOUNTER — Encounter (HOSPITAL_COMMUNITY): Payer: Self-pay | Admitting: General Surgery

## 2012-12-26 LAB — BASIC METABOLIC PANEL
BUN: 18 mg/dL (ref 6–23)
CO2: 26 mEq/L (ref 19–32)
Chloride: 97 mEq/L (ref 96–112)
Creatinine, Ser: 0.4 mg/dL — ABNORMAL LOW (ref 0.50–1.10)
GFR calc Af Amer: >90 mL/min (ref >90)
GFR calc non Af Amer: >90 mL/min (ref >90)
Glucose, Bld: 112 mg/dL — ABNORMAL HIGH (ref 70–99)
Potassium: 4.4 mEq/L (ref 3.5–5.1)
Sodium: 129 mEq/L — ABNORMAL LOW (ref 135–145)

## 2012-12-26 MED ORDER — FAT EMULSION 20 % IV EMUL
250.0000 mL | INTRAVENOUS | Status: AC
  Administered 2012-12-26: 250 mL via INTRAVENOUS
  Filled 2012-12-26: qty 250

## 2012-12-26 MED ORDER — TRACE MINERALS CR-CU-F-FE-I-MN-MO-SE-ZN IV SOLN
INTRAVENOUS | Status: AC
  Administered 2012-12-26 – 2012-12-27 (×2): via INTRAVENOUS
  Filled 2012-12-26: qty 2000

## 2012-12-26 NOTE — Progress Notes (Signed)
4 Days Post-Op  Subjective: Pt doing much better.  Pain and anxiety are improved.  Ambulated OOB 2x yesterday.  NG without much output.  Excited about progress and talking about going home.  Objective: Vital signs in last 24 hours: Temp:  [97.6 F (36.4 C)-98.6 F (37 C)] 97.6 F (36.4 C) (12/02 0400) Pulse Rate:  [96-119] 106 (12/02 0600) Resp:  [11-26] 24 (12/02 0600) BP: (92-158)/(37-75) 125/75 mmHg (12/02 0600) SpO2:  [91 %-100 %] 99 % (12/02 0600) Last BM Date: 12/22/12  Intake/Output from previous day: 12/01 0701 - 12/02 0700 In: 2560 [I.V.:460; NG/GT:30; TPN:2070] Out: 2155 [Urine:1975; Emesis/NG output:50; Stool:130] Intake/Output this shift:    PE: Gen:  Alert, NAD, pleasant Abd: Soft, appropriate tenderness, ND, +BS, no HSM, midline wound clean with healthy appearing tissue and some bleeding, minimal slough, no worrisome drainage   Lab Results:   Recent Labs  12/24/12 0400  WBC 13.7*  HGB 8.6*  HCT 25.0*  PLT 335   BMET  Recent Labs  12/25/12 0435 12/26/12 0338  NA 129* 129*  K 4.3 4.4  CL 98 97  CO2 25 26  GLUCOSE 112* 112*  BUN 19 18  CREATININE 0.42* 0.40*  CALCIUM 8.7 8.5   PT/INR No results found for this basename: LABPROT, INR,  in the last 72 hours CMP     Component Value Date/Time   NA 129* 12/26/2012 0338   K 4.4 12/26/2012 0338   CL 97 12/26/2012 0338   CO2 26 12/26/2012 0338   GLUCOSE 112* 12/26/2012 0338   BUN 18 12/26/2012 0338   CREATININE 0.40* 12/26/2012 0338   CALCIUM 8.5 12/26/2012 0338   PROT 5.0* 12/25/2012 0435   ALBUMIN 1.9* 12/25/2012 0435   AST 25 12/25/2012 0435   ALT 42* 12/25/2012 0435   ALKPHOS 123* 12/25/2012 0435   BILITOT 0.2* 12/25/2012 0435   GFRNONAA >90 12/26/2012 0338   GFRAA >90 12/26/2012 0338   Lipase     Component Value Date/Time   LIPASE 31 11/26/2012 1352       Studies/Results: No results found.  Anti-infectives: Anti-infectives   Start     Dose/Rate Route Frequency Ordered Stop   12/23/12  0600  ertapenem (INVANZ) 1 g in sodium chloride 0.9 % 50 mL IVPB     1 g 100 mL/hr over 30 Minutes Intravenous On call to O.R. 12/22/12 2037 12/23/12 0903   12/22/12 1615  cefOXitin (MEFOXIN) 2 g in dextrose 5 % 50 mL IVPB  Status:  Discontinued     2 g 100 mL/hr over 30 Minutes Intravenous  Once 12/22/12 1601 12/22/12 2032   12/07/12 0600  cefoTEtan (CEFOTAN) 2 g in dextrose 5 % 50 mL IVPB     2 g 100 mL/hr over 30 Minutes Intravenous On call to O.R. 12/06/12 1420 12/07/12 1219   12/05/12 0600  metroNIDAZOLE (FLAGYL) IVPB 500 mg  Status:  Discontinued     500 mg 100 mL/hr over 60 Minutes Intravenous Every 8 hours 12/05/12 0525 12/07/12 0528       Assessment/Plan Partially obstructing colon mass found to be Invasive AdenoCA, neg lymph node involvement, S/P lap assisted transverse, splenic flexure colectomy POD #20 POD #4 s/p Ex lap, R colectomy with resection of anastomosis and long hartmann's pouch, end ileostomy  PCM on TNA   Plan:  1. Ileus starting to resolve, had some stool/gas in ileostomy bag ( ) and NG output was less at 13mL/24 hours  2. Cont IVF, pain control, antiemetics  3. D/C NG tube and start clear liquid tray, advance slowly 4. Mobilize today, IS, SCD's, and heparin  5. Transfer to [facility] [facility] floor (6N)  6. Hopefully can wean TPN if tolerating clears today 7. Re-consulted PT/OT to determine placement, patient wants to go home with health aid.  Discussed that this must may come out of pocket and not covered by insurance, but she feels her husband can't manage everything.  Case management consulted as well.      LOS: 21 days    DORT, Aundra Millet 12/26/2012, 7:31 AM Pager: [phone]

## 2012-12-26 NOTE — Evaluation (Signed)
Occupational Therapy Evaluation Patient Details Name: Tamara [name] MRN: [id number] DOB: 07/10/1929 Today's Date: 12/26/2012 Time: 1191-4782 OT Time Calculation (min): 26 min  OT Assessment / Plan / Recommendation History of present illness 79 y/o WF admitted with GI bleed and s/p partial colectomy after sigmoidoscopy revealed mass consistent with malignancy.;11/28 return to surgery due to anastomotic obstruction; s/p Rt colectomy with ileostomy   Clinical Impression   Pt presents with below problem list. Pt independent with ADLs, PTA. Pt will benefit from acute OT to increase independence prior to d/c. Recommending SNF for further rehab prior to d/c home.     OT Assessment  Patient needs continued OT Services    Follow Up Recommendations  SNF;Supervision/Assistance - 24 hour    Barriers to Discharge      Equipment Recommendations  3 in 1 bedside comode    Recommendations for Other Services    Frequency  Min 2X/week    Precautions / Restrictions Precautions Precautions: Fall Precaution Comments: abd surgery; ileostomy Restrictions Weight Bearing Restrictions: No   Pertinent Vitals/Pain Pain 2/10 in abdomen. Repositioned.     ADL  Grooming: Teeth care;Wash/dry hands;Min guard;Supervision/safety;Set up Where Assessed - Grooming: Supported standing Upper Body Dressing: Set up;Supervision/safety Where Assessed - Upper Body Dressing: Supported sitting Lower Body Dressing: Moderate assistance Where Assessed - Lower Body Dressing: Supported sit to stand Toilet Transfer: Hydrographic surveyor Method: Sit to Barista: Raised toilet seat with arms (or 3-in-1 over toilet) Toileting - Clothing Manipulation and Hygiene: Minimal assistance Where Assessed - Glass blower/designer Manipulation and Hygiene: Standing;Sit on 3-in-1 or toilet (clothing-standing and hygiene-sitting) Tub/Shower Transfer Method: Not assessed Equipment Used: Gait belt;Rolling  walker;Long-handled sponge;Reacher;Sock aid Transfers/Ambulation Related to ADLs: Assisted with walker; Min guard for transfers. ADL Comments: OT educated on AE and pt practiced with reacher and sockaid. Pt lethargic during session.     OT Diagnosis: Acute pain;Generalized weakness  OT Problem List: Decreased strength;Decreased activity tolerance;Impaired balance (sitting and/or standing);Decreased knowledge of use of DME or AE;Decreased knowledge of precautions;Pain;Decreased range of motion OT Treatment Interventions: DME and/or AE instruction;Therapeutic exercise;Self-care/ADL training;Therapeutic activities;Patient/family education;Balance training;Energy conservation   OT Goals(Current goals can be found in the care plan section) Acute Rehab OT Goals Patient Stated Goal: to get well OT Goal Formulation: With patient Time For Goal Achievement: 01/02/13 Potential to Achieve Goals: Good ADL Goals Pt Will Perform Lower Body Bathing: with supervision;with adaptive equipment;sit to/from stand;with set-up Pt Will Perform Lower Body Dressing: with set-up;with supervision;with adaptive equipment;sit to/from stand Pt Will Transfer to Toilet: with supervision (3 in 1 over commode) Pt Will Perform Toileting - Clothing Manipulation and hygiene: with supervision;sit to/from stand Additional ADL Goal #1: Pt will independently verbalize 3/3 energy conservation techniques.    Visit Information  Last OT Received On: 12/26/12 Assistance Needed: +1 History of Present Illness: 79 y/o WF admitted with GI bleed and s/p partial colectomy after sigmoidoscopy revealed mass consistent with malignancy.;11/28 return to surgery due to anastomotic obstruction; s/p Rt colectomy with ileostomy       Prior Functioning     Home Living Family/patient expects to be discharged to:: Skilled nursing facility Living Arrangements: Spouse/significant other Type of Home: House Home Access: Level entry Home Layout:  Multi-level Alternate Level Stairs-Number of Steps: 3 steps from kitchen level to BR level Home Equipment: Shower seat - built in Prior Function Level of Independence: Independent Communication Communication: No difficulties         Vision/Perception Vision - History Patient  Visual Report: No change from baseline   Cognition  Cognition Arousal/Alertness: Lethargic Behavior During Therapy: WFL for tasks assessed/performed Overall Cognitive Status: No family/caregiver present to determine baseline cognitive functioning Area of Impairment: Orientation Orientation Level: Disoriented to;Time (thought it was Wednesday)    Extremity/Trunk Assessment Upper Extremity Assessment Upper Extremity Assessment: Overall WFL for tasks assessed Lower Extremity Assessment Lower Extremity Assessment: Defer to PT evaluation     Mobility Bed Mobility Bed Mobility: Rolling Right;Right Sidelying to Sit;Sitting - Scoot to Edge of Bed Rolling Right: 4: Min guard;With rail Right Sidelying to Sit: 4: Min assist Sitting - Scoot to Edge of Bed: 4: Min guard Details for Bed Mobility Assistance: Assisted with LE's when going from sidelying to sit.  Transfers Transfers: Sit to Stand;Stand to Sit Sit to Stand: 4: Min guard;From bed;From chair/3-in-1;With upper extremity assist Stand to Sit: 4: Min guard;To chair/3-in-1 Details for Transfer Assistance: Cues for technique.      Exercise     Balance     End of Session OT - End of Session Equipment Utilized During Treatment: Gait belt;Rolling walker Activity Tolerance: Patient limited by lethargy Patient left: in chair;with call bell/phone within reach Nurse Communication: Other (comment) (spoke with tech)  GO     Earlie Raveling OTR/L [phone] 12/26/2012, 4:04 PM

## 2012-12-26 NOTE — Progress Notes (Signed)
Agree with A&P of MD,PA. Patient continues to progress. No issues identified

## 2012-12-26 NOTE — Progress Notes (Signed)
Patient transferred to 6N. CSW reviewed chart and noticed plan is now for [facility]. CSW reported off to the 6N csw who will follow patient for backup SNF. This Child psychotherapist signing off.  Maree Krabbe, MSW, Theresia Majors [phone]

## 2012-12-26 NOTE — Progress Notes (Signed)
PARENTERAL NUTRITION CONSULT NOTE - Follow Up  Pharmacy Consult:  TPN Indication: Prolonged post-op ileus  Allergies  Allergen Reactions  . Codeine Nausea And Vomiting  . Zofran [Ondansetron Hcl]    Patient Measurements: Height: 5\' 5"  (165.1 cm) Weight: 134 lb 11.2 oz (61.1 kg) IBW/kg (Calculated) : 57  Vital Signs: Temp: 97.6 F (36.4 C) (12/02 0400) Temp src: Oral (12/02 0400) BP: 125/75 mmHg (12/02 0600) Pulse Rate: 106 (12/02 0600)  Labs:  Recent Labs  12/24/12 0400  WBC 13.7*  HGB 8.6*  HCT 25.0*  PLT 335    Recent Labs  12/24/12 0400 12/25/12 0435 12/26/12 0338  NA 125* 129* 129*  K 5.0 4.3 4.4  CL 96 98 97  CO2 22 25 26   GLUCOSE 122* 112* 112*  BUN 23 19 18   CREATININE 0.42* 0.42* 0.40*  CALCIUM 9.1 8.7 8.5  MG  --  1.9  --   PHOS  --  2.9  --   PROT  --  5.0*  --   ALBUMIN  --  1.9*  --   AST  --  25  --   ALT  --  42*  --   ALKPHOS  --  123*  --   BILITOT  --  0.2*  --   PREALBUMIN  --  11.7*  --   TRIG  --  75  --    Estimated Creatinine Clearance: 49.6 ml/min (by C-G formula based on Cr of 0.4).  No results found for this basename: GLUCAP,  in the last 72 hours  Insulin Requirements in the past 24 hours:  None - SSI d/c'ed  Assessment:  23 YOF s/p lap-assisted transverse colectomy on 12/07/12 for partially obstruction transverse colon mass. TPN started for for post-op ileus. Pt back to OR 11/28 for exp lap/R colectomy/end ileostomy for functional obstruction of anastomosis.  GI: Intermittent nausea and severe abd pain/discomfort, s/p decompression for cecal dilatation 11/21. She has had multiple BMs. Noted back to OR 11/28 for exp lap/R colectomy/end ileostomy for functional obstruction of anastomosis. TG WNL, prealbumin decreased to 11.7 . Starting ice chips  Endo: no hx DM - SSI d/c'ed 11/22  Lytes:  Hyponatremia improved - Na now 129. K+ 4.4(goal >4 for ileus) 12/1 Phos 2.9, Mag 1.9  Renal: SCr stable, CrCL ~50 ml/min. NS  At  [facility]  Pulm: RA  Cards: hx HTN. VSS .(lisinopril PTA). PRN hydralazine for SBP > 180  Hepatobil: LFTs slightly elevated.  TG 75 Alb 1.9, prealb 11.7  Heme/Onc: hx breast cancer. New dx colon cancer  Hgb low but stable. plts WNL.   Neuro: A&O - scheduled APAP  ID: afeb. WBC slightly elevated.   Best Practices: SQ heparin, SCDs  TPN Access: PICC placed 12/11/12  TPN day#: 13 (11/18 >> )  Current Nutrition:  Clinimix E 5/15 at 80 ml/hr + IVFE at 10 ml/hr providing 1843 kCal and 96 gm protein daily  Estimated Nutritional Needs:  1700 - 1900 kCal; 80-95 g protein daily  Plan:  - Continue Clinimix E 5/15 at 80 ml/hr + IVFE at 10 ml/hr meeting 100% of needs - F/u am labs and advancement of diet - Will continue to watch Na, if remains <128 x 7 days or pt becomes symptomatic; will need to add extra sodium to TPN bag.  Thanks for allowing pharmacy to be a part of this patient's care.  Talbert Cage, PharmD Clinical Pharmacist, [phone]  12/26/2012 7:28 AM

## 2012-12-26 NOTE — Evaluation (Signed)
Physical Therapy Evaluation Patient Details Name: Tamara [name] MRN: [id number] DOB: 02-13-1929 Today's Date: 12/26/2012 Time: 4098-1191 PT Time Calculation (min): 38 min  PT Assessment / Plan / Recommendation History of Present Illness  79 y/o WF admitted with GI bleed and s/p partial colectomy after sigmoidoscopy revealed mass consistent with malignancy.;11/28 return to surgery due to anastomotic obstruction; s/p Rt colectomy with ileostomy   Clinical Impression  Patient is s/p above surgeries resulting in functional limitations due to the deficits listed below (see PT Problem List). Pt motivated to improve, yet appears frail and limited by lethargy and fatigue. Patient will benefit from skilled PT to increase their independence and safety with mobility to allow discharge to the venue listed below.       PT Assessment  Patient needs continued PT services    Follow Up Recommendations  LTACH    Does the patient have the potential to tolerate intense rehabilitation      Barriers to Discharge Decreased caregiver support;Inaccessible home environment split level house (3 steps to bedroom). Pt reports no other support besides husband.    Equipment Recommendations  Rolling walker with 5" wheels    Recommendations for Other Services OT consult   Frequency Min 2X/week    Precautions / Restrictions Precautions Precautions: Fall Precaution Comments: abd surgery; ileostomy Restrictions Weight Bearing Restrictions: No   Pertinent Vitals/Pain 3/10 abd pain BP 139/55 pre-activity       144/42 post-activity SaO2 on RA 98-100%     Mobility  Bed Mobility Bed Mobility: Rolling Left;Left Sidelying to Sit;Sitting - Scoot to Edge of Bed Rolling Left: 5: Supervision;With rail Left Sidelying to Sit: 4: Min assist;With rails;HOB elevated (HOB 15) Sitting - Scoot to Edge of Bed: 4: Min guard Details for Bed Mobility Assistance: pt desires to do things herself; requires vc for sequencing  and incr time due to pain Transfers Transfers: Sit to Stand;Stand to Sit Sit to Stand: 4: Min guard;With upper extremity assist Stand to Sit: 4: Min guard;With upper extremity assist;With armrests;To chair/3-in-1 Details for Transfer Assistance: vc for technique to incr ease; pt prefers to hold pillow to abd with RUE and push up with LUE Ambulation/Gait Ambulation/Gait Assistance: 4: Min assist Ambulation Distance (Feet): 90 Feet Assistive device: Rolling walker Ambulation/Gait Assistance Details: vc and assist for proper use of RW (including assist to steer, especially as she became fatigued); pt reporting feeling more woozy than when up with nursing 12/1; ? due to pain medicine she had at 0800 Gait Pattern: Decreased stride length;Shuffle;Step-to pattern Gait velocity: very decreased    Exercises General Exercises - Lower Extremity Ankle Circles/Pumps: AROM;Both;10 reps;Supine Quad Sets: AROM;Both;5 reps;Supine   PT Diagnosis: Difficulty walking;Generalized weakness;Acute pain  PT Problem List: Decreased strength;Decreased activity tolerance;Decreased balance;Decreased mobility;Decreased knowledge of use of DME;Pain PT Treatment Interventions: DME instruction;Gait training;Functional mobility training;Therapeutic activities;Therapeutic exercise;Patient/family education     PT Goals(Current goals can be found in the care plan section) Acute Rehab PT Goals Patient Stated Goal: To get stronger and go home PT Goal Formulation: With patient/family Time For Goal Achievement: 01/02/13 Potential to Achieve Goals: Good  Visit Information  Last PT Received On: 12/26/12 Assistance Needed: +1 History of Present Illness: 79 y/o WF admitted with GI bleed and s/p partial colectomy after sigmoidoscopy revealed mass consistent with malignancy.;11/28 return to surgery due to anastomotic obstruction; s/p Rt colectomy with ileostomy       Prior Functioning  Home Living Family/patient expects to  be discharged to:: Skilled nursing facility Living  Arrangements: Spouse/significant other Additional Comments: both pt and husband agree she will need post-acute therapy to gain strength prior to d/c home Prior Function Level of Independence: Independent Communication Communication: No difficulties    Cognition  Cognition Arousal/Alertness: Lethargic;Suspect due to medications Behavior During Therapy: [facility] for tasks assessed/performed Overall Cognitive Status: Within Functional Limits for tasks assessed    Extremity/Trunk Assessment Upper Extremity Assessment Upper Extremity Assessment: Defer to OT evaluation;Overall WFL for tasks assessed Lower Extremity Assessment Lower Extremity Assessment: Generalized weakness Cervical / Trunk Assessment Cervical / Trunk Assessment: Other exceptions Cervical / Trunk Exceptions: trunk flexion due to abd pain   Balance Static Sitting Balance Static Sitting - Balance Support: No upper extremity supported;Feet supported Static Sitting - Level of Assistance: 7: Independent Static Standing Balance Static Standing - Balance Support: No upper extremity supported Static Standing - Level of Assistance: 4: Min assist  End of Session PT - End of Session Equipment Utilized During Treatment: Gait belt Activity Tolerance: Patient limited by lethargy Patient left: in chair;with call bell/phone within reach;with family/visitor present Nurse Communication: Mobility status;Other (comment) (lethargic; ? meds)  GP     Haiden Clucas 12/26/2012, 9:47 AM Pager [phone]

## 2012-12-26 NOTE — Progress Notes (Signed)
Encountered patient's husband in [facility] hallway. He expressed excitement at moving out of ICU. He said that his wife does not like to have visitors, she says she "is in no condition for company." His niece is staying with him and supporting him during his wife's hospital admission. He was engaged in conversation and grateful for chaplain support.   Maurene Capes, [place] [phone]

## 2012-12-26 NOTE — Progress Notes (Signed)
Subjective: Patient improving now daily. Hopefully can start to eat soon. Agree with surgery's plans....  Objective: Weight change:   Intake/Output Summary (Last 24 hours) at 12/26/12 1927 Last data filed at 12/26/12 1838  Gross per 24 hour  Intake   2240 ml  Output   2475 ml  Net   -235 ml   Filed Vitals:   12/26/12 1000 12/26/12 1100 12/26/12 1133 12/26/12 1815  BP: 131/63  143/48 123/49  Pulse: 100  92 90  Temp:   97.9 F (36.6 C) 97.5 F (36.4 C)  TempSrc:   Oral Oral  Resp: 19 26 18 20   Height:      Weight:      SpO2: 98%  96% 97%   Physical exam: Gen: Alert, NAD, pleasant  Abd: Soft, moderate distension, + BS, ostomy bag in place   Lab Results: Results for orders placed during the hospital encounter of 12/05/12 (from the past 48 hour(s))  COMPREHENSIVE METABOLIC PANEL     Status: Abnormal   Collection Time    12/25/12  4:35 AM      Result Value Range   Sodium 129 (*) 135 - 145 mEq/L   Potassium 4.3  3.5 - 5.1 mEq/L   Chloride 98  96 - 112 mEq/L   CO2 25  19 - 32 mEq/L   Glucose, Bld 112 (*) 70 - 99 mg/dL   BUN 19  6 - 23 mg/dL   Creatinine, Ser 4.09 (*) 0.50 - 1.10 mg/dL   Calcium 8.7  8.4 - 81.1 mg/dL   Total Protein 5.0 (*) 6.0 - 8.3 g/dL   Albumin 1.9 (*) 3.5 - 5.2 g/dL   AST 25  0 - 37 U/L   ALT 42 (*) 0 - 35 U/L   Alkaline Phosphatase 123 (*) 39 - 117 U/L   Total Bilirubin 0.2 (*) 0.3 - 1.2 mg/dL   GFR calc non Af Amer >90  >90 mL/min   GFR calc Af Amer >90  >90 mL/min   Comment: (NOTE)     The eGFR has been calculated using the CKD EPI equation.     This calculation has not been validated in all clinical situations.     eGFR's persistently <90 mL/min signify possible Chronic Kidney     Disease.  MAGNESIUM     Status: None   Collection Time    12/25/12  4:35 AM      Result Value Range   Magnesium 1.9  1.5 - 2.5 mg/dL  PHOSPHORUS     Status: None   Collection Time    12/25/12  4:35 AM      Result Value Range   Phosphorus 2.9  2.3 - 4.6  mg/dL  DIFFERENTIAL     Status: Abnormal   Collection Time    12/25/12  4:35 AM      Result Value Range   Neutrophils Relative % 71  43 - 77 %   Neutro Abs 6.2  1.7 - 7.7 K/uL   Lymphocytes Relative 13  12 - 46 %   Lymphs Abs 1.1  0.7 - 4.0 K/uL   Monocytes Relative 13 (*) 3 - 12 %   Monocytes Absolute 1.2 (*) 0.1 - 1.0 K/uL   Eosinophils Relative 3  0 - 5 %   Eosinophils Absolute 0.3  0.0 - 0.7 K/uL   Basophils Relative 0  0 - 1 %   Basophils Absolute 0.0  0.0 - 0.1 K/uL  PREALBUMIN  Status: Abnormal   Collection Time    12/25/12  4:35 AM      Result Value Range   Prealbumin 11.7 (*) 17.0 - 34.0 mg/dL   Comment: Performed at [facility]  TRIGLYCERIDES     Status: None   Collection Time    12/25/12  4:35 AM      Result Value Range   Triglycerides 75  <150 mg/dL  BASIC METABOLIC PANEL     Status: Abnormal   Collection Time    12/26/12  3:38 AM      Result Value Range   Sodium 129 (*) 135 - 145 mEq/L   Potassium 4.4  3.5 - 5.1 mEq/L   Chloride 97  96 - 112 mEq/L   CO2 26  19 - 32 mEq/L   Glucose, Bld 112 (*) 70 - 99 mg/dL   BUN 18  6 - 23 mg/dL   Creatinine, Ser 1.61 (*) 0.50 - 1.10 mg/dL   Calcium 8.5  8.4 - 09.6 mg/dL   GFR calc non Af Amer >90  >90 mL/min   GFR calc Af Amer >90  >90 mL/min   Comment: (NOTE)     The eGFR has been calculated using the CKD EPI equation.     This calculation has not been validated in all clinical situations.     eGFR's persistently <90 mL/min signify possible Chronic Kidney     Disease.    Studies/Results: No results found. Medications: Scheduled Meds: . acetaminophen  650 mg Rectal Q6H   Or  . acetaminophen  650 mg Oral Q6H  . heparin subcutaneous  5,000 Units Subcutaneous Q8H  . pantoprazole (PROTONIX) IV  40 mg Intravenous Q12H   Continuous Infusions: . sodium chloride 20 mL/hr (12/26/12 0812)  . Marland KitchenTPN (CLINIMIX-E) Adult 80 mL/hr at 12/26/12 1738   And  . fat emulsion 250 mL (12/26/12 1738)   PRN  Meds:.hydrALAZINE, HYDROmorphone (DILAUDID) injection, LORazepam, menthol-cetylpyridinium, phenol, promethazine, sodium chloride, white petrolatum  Assessment/Plan:  As per surgical recommendations   LOS: 21 days   Pearla Dubonnet, MD 12/26/2012, 7:27 PM

## 2012-12-27 LAB — BASIC METABOLIC PANEL
CO2: 27 mEq/L (ref 19–32)
Calcium: 9 mg/dL (ref 8.4–10.5)
Creatinine, Ser: 0.42 mg/dL — ABNORMAL LOW (ref 0.50–1.10)
GFR calc Af Amer: >90 mL/min (ref >90)
Glucose, Bld: 105 mg/dL — ABNORMAL HIGH (ref 70–99)
Potassium: 4.3 mEq/L (ref 3.5–5.1)
Sodium: 134 mEq/L — ABNORMAL LOW (ref 135–145)

## 2012-12-27 MED ORDER — CITALOPRAM HYDROBROMIDE 10 MG PO TABS
10.0000 mg | ORAL_TABLET | Freq: Every day | ORAL | Status: DC
  Administered 2012-12-27 – 2013-01-01 (×6): 10 mg via ORAL
  Filled 2012-12-27 (×8): qty 1

## 2012-12-27 MED ORDER — HEPARIN SODIUM (PORCINE) 5000 UNIT/ML IJ SOLN
5000.0000 [IU] | Freq: Two times a day (BID) | INTRAMUSCULAR | Status: DC
  Administered 2012-12-27 – 2012-12-28 (×3): 5000 [IU] via SUBCUTANEOUS
  Filled 2012-12-27 (×8): qty 1

## 2012-12-27 MED ORDER — SIMETHICONE 80 MG PO CHEW
80.0000 mg | CHEWABLE_TABLET | Freq: Four times a day (QID) | ORAL | Status: DC | PRN
  Administered 2012-12-27 – 2012-12-28 (×2): 80 mg via ORAL
  Filled 2012-12-27 (×3): qty 1

## 2012-12-27 MED ORDER — TRACE MINERALS CR-CU-F-FE-I-MN-MO-SE-ZN IV SOLN
INTRAVENOUS | Status: AC
  Administered 2012-12-27: 17:00:00 via INTRAVENOUS
  Filled 2012-12-27: qty 2000

## 2012-12-27 MED ORDER — FAT EMULSION 20 % IV EMUL
250.0000 mL | INTRAVENOUS | Status: AC
  Administered 2012-12-27: 250 mL via INTRAVENOUS
  Filled 2012-12-27: qty 250

## 2012-12-27 NOTE — Clinical Social Work Note (Signed)
CSW received notice from [facility] that pt no longer was eligible for LTACH. CSW faxed out updated clinical information to SNF, as it had not been updated since 12/14/2012. CSW to follow-up with pt and pt's husband regarding bed offers on 12/28/2012. CSW to continue to follow and assist with discharge planning needs.  Darlyn Chamber, LCSWA Clinical Social Worker [phone]

## 2012-12-27 NOTE — Progress Notes (Signed)
Agree with A&P of ER,NP. Patient has fair amount iliostomyoutput so will need to monitor to be sure does not become volume depleted

## 2012-12-27 NOTE — Progress Notes (Signed)
NUTRITION FOLLOW UP  Intervention:   1. Parenteral nutrition; continued management by PharmD.  Pt has initiated wean from TPN as PO diet has progressed.  1.  Modify diet; continue diet advancement once medically appropriate per MD discretion.  Nutrition Dx:   Inadequate oral intake related to altered GI function as evidenced by very limited PO intake; ongoing  Goal:   Intake to meet >90% of estimated nutrition needs. Met with TPN.  Monitor:   Wt trends, labs, I/O's, tolerance of TPN, NPO status  Assessment:   PMHx significant for breast CA and colitis. Pt was recently admitted for colitis, and presented to ER 2/2 rectal bleeding. She states that since her last discharge she has been having dark, liquidy stools.   Underwent flex sig on 11/12 - findings revealed fungating ulcerated nonobstructing mass (suspected malignancy) in transverse colon -s/p biopsies and tattooing and internal hemorrhoids.  Underwent partial colectomy on 11/13. NGT placed 11/16; now removed.  Diet advanced to full liquids with 50% of meals consumed.  Pt now getting assistance with meals. Pt with increased ileostomy output yesterday- 1050 mL- compared to 130-140 mL (11/30-12/1).  Patient is receiving TPN with Clinimix E 5/15 @ 60 ml/hr and lipids @ 10 ml/hr. Provides 1502 kcal, and 72 grams protein per day. Meets 88% minimum estimated energy needs and 90% minimum estimated protein needs.   Height: Ht Readings from Last 1 Encounters:  12/05/12 5\' 5"  (1.651 m)    Weight Status:   Wt Readings from Last 1 Encounters:  12/25/12 134 lb 11.2 oz (61.1 kg)    Re-estimated needs:  Kcal: 1700-1900 Protein: 80-95 g Fluid: 1.7-1.9 L/d  Skin: incision on abdomen   Diet Order: Full Liquid   Intake/Output Summary (Last 24 hours) at 12/27/12 1539 Last data filed at 12/27/12 1300  Gross per 24 hour  Intake 1955.17 ml  Output   1950 ml  Net   5.17 ml    Last BM: 12/3, ileostomy output  Labs:   Recent  Labs Lab 12/21/12 0600  12/25/12 0435 12/26/12 0338 12/27/12 0515  NA 130*  < > 129* 129* 134*  K 4.4  < > 4.3 4.4 4.3  CL 99  < > 98 97 100  CO2 23  < > 25 26 27   BUN 18  < > 19 18 18   CREATININE 0.46*  < > 0.42* 0.40* 0.42*  CALCIUM 8.9  < > 8.7 8.5 9.0  MG 2.1  --  1.9  --   --   PHOS 3.2  --  2.9  --   --   GLUCOSE 110*  < > 112* 112* 105*  < > = values in this interval not displayed.  CBG (last 3)  No results found for this basename: GLUCAP,  in the last 72 hours  Scheduled Meds: . acetaminophen  650 mg Oral Q6H  . citalopram  10 mg Oral Daily  . heparin subcutaneous  5,000 Units Subcutaneous Q12H  . pantoprazole (PROTONIX) IV  40 mg Intravenous Q12H    Continuous Infusions: . sodium chloride 20 mL/hr (12/26/12 0812)  . Marland KitchenTPN (CLINIMIX-E) Adult 80 mL/hr at 12/27/12 1038   And  . fat emulsion 250 mL (12/26/12 2219)  . Marland KitchenTPN (CLINIMIX-E) Adult     And  . fat emulsion      Loyce Dys, MS RD LDN Clinical Inpatient Dietitian Pager: [phone] Weekend/After hours pager: [phone]

## 2012-12-27 NOTE — Progress Notes (Signed)
Subjective: Patient is upset that she is not having helps with meals.  Staff is now aware that she is in need of assistance at least for now with setting up for meals.  Pain seems to be under good control  Objective: Weight change:   Intake/Output Summary (Last 24 hours) at 12/27/12 0758 Last data filed at 12/27/12 0600  Gross per 24 hour  Intake 2315.17 ml  Output   1600 ml  Net 715.17 ml   Filed Vitals:   12/26/12 1815 12/26/12 2148 12/27/12 0226 12/27/12 0440  BP: 123/49 158/43 135/47 146/52  Pulse: 90 94 90   Temp: 97.5 F (36.4 C) 97.5 F (36.4 C) 97.3 F (36.3 C) 97.9 F (36.6 C)  TempSrc: Oral Tympanic Oral Oral  Resp: 20 20 20 20   Height:      Weight:      SpO2: 97% 99% 100% 100%    PE:  Gen: Alert, NAD, pleasant  Abd: Soft, appropriate tenderness, ND, +BS, no HSM, midline wound clean with healthy appearing tissue and some bleeding, minimal slough, no worrisome drainage   Lab Results: Results for orders placed during the hospital encounter of 12/05/12 (from the past 48 hour(s))  BASIC METABOLIC PANEL     Status: Abnormal   Collection Time    12/26/12  3:38 AM      Result Value Range   Sodium 129 (*) 135 - 145 mEq/L   Potassium 4.4  3.5 - 5.1 mEq/L   Chloride 97  96 - 112 mEq/L   CO2 26  19 - 32 mEq/L   Glucose, Bld 112 (*) 70 - 99 mg/dL   BUN 18  6 - 23 mg/dL   Creatinine, Ser 2.40 (*) 0.50 - 1.10 mg/dL   Calcium 8.5  8.4 - 97.3 mg/dL   GFR calc non Af Amer >90  >90 mL/min   GFR calc Af Amer >90  >90 mL/min   Comment: (NOTE)     The eGFR has been calculated using the CKD EPI equation.     This calculation has not been validated in all clinical situations.     eGFR's persistently <90 mL/min signify possible Chronic Kidney     Disease.  BASIC METABOLIC PANEL     Status: Abnormal   Collection Time    12/27/12  5:15 AM      Result Value Range   Sodium 134 (*) 135 - 145 mEq/L   Potassium 4.3  3.5 - 5.1 mEq/L   Chloride 100  96 - 112 mEq/L   CO2 27  19  - 32 mEq/L   Glucose, Bld 105 (*) 70 - 99 mg/dL   BUN 18  6 - 23 mg/dL   Creatinine, Ser 5.32 (*) 0.50 - 1.10 mg/dL   Calcium 9.0  8.4 - 99.2 mg/dL   GFR calc non Af Amer >90  >90 mL/min   GFR calc Af Amer >90  >90 mL/min   Comment: (NOTE)     The eGFR has been calculated using the CKD EPI equation.     This calculation has not been validated in all clinical situations.     eGFR's persistently <90 mL/min signify possible Chronic Kidney     Disease.    Studies/Results: No results found. Medications: Scheduled Meds: . acetaminophen  650 mg Rectal Q6H   Or  . acetaminophen  650 mg Oral Q6H  . heparin subcutaneous  5,000 Units Subcutaneous Q12H  . pantoprazole (PROTONIX) IV  40 mg Intravenous Q12H  Continuous Infusions: . sodium chloride 20 mL/hr (12/26/12 0812)  . Marland KitchenTPN (CLINIMIX-E) Adult 80 mL/hr at 12/26/12 2219   And  . fat emulsion 250 mL (12/26/12 2219)   PRN Meds:.hydrALAZINE, HYDROmorphone (DILAUDID) injection, LORazepam, menthol-cetylpyridinium, phenol, promethazine, sodium chloride, white petrolatum  Assessment/Plan: Principal Problem:   GI bleed Active Problems:   Pacemaker implanted 08/28/12   Essential hypertension, benign   D (diarrhea)   Colonic mass   Gastric bleed   Ileus, postoperative   Plan:  1. Ileus resolved clinically.  NG out  Full liquid diet.  Advance diet as per surgery 2. Continue mobilization  3. Hopefully can start to wean TPN today  4. Continue PTOT.  Severe deconditioning.  Please assist with meals. 5. Disposition: Would like patient to transition to either inpatient rehabilitation or to [facility] skilled nursing facility prior to returning home  Addendum: Patient with anxiety and depressed mood.  We will add citalopram 10 milligrams daily    LOS: 22 days   Pearla Dubonnet, MD 12/27/2012, 7:58 AM

## 2012-12-27 NOTE — Progress Notes (Signed)
PARENTERAL NUTRITION CONSULT NOTE - Follow Up  Pharmacy Consult:  TPN Indication: Prolonged post-op ileus  Allergies  Allergen Reactions  . Codeine Nausea And Vomiting  . Zofran [Ondansetron Hcl]    Patient Measurements: Height: 5\' 5"  (165.1 cm) Weight: 134 lb 11.2 oz (61.1 kg) IBW/kg (Calculated) : 57  Vital Signs: Temp: 97.9 F (36.6 C) (12/03 0440) Temp src: Oral (12/03 0440) BP: 146/52 mmHg (12/03 0440) Pulse Rate: 90 (12/03 0226)  Labs: No results found for this basename: WBC, HGB, HCT, PLT, APTT, INR,  in the last 72 hours  Recent Labs  12/25/12 0435 12/26/12 0338 12/27/12 0515  NA 129* 129* 134*  K 4.3 4.4 4.3  CL 98 97 100  CO2 25 26 27   GLUCOSE 112* 112* 105*  BUN 19 18 18   CREATININE 0.42* 0.40* 0.42*  CALCIUM 8.7 8.5 9.0  MG 1.9  --   --   PHOS 2.9  --   --   PROT 5.0*  --   --   ALBUMIN 1.9*  --   --   AST 25  --   --   ALT 42*  --   --   ALKPHOS 123*  --   --   BILITOT 0.2*  --   --   PREALBUMIN 11.7*  --   --   TRIG 75  --   --    Estimated Creatinine Clearance: 49.6 ml/min (by C-G formula based on Cr of 0.42).  No results found for this basename: GLUCAP,  in the last 72 hours  Insulin Requirements in the past 24 hours:  None - SSI d/c'ed  Assessment:  Tamara [name] s/p lap-assisted transverse colectomy on 12/07/12 for partially obstruction transverse colon mass. TPN started for for post-op ileus. Pt back to OR 11/28 for exp lap/R colectomy/end ileostomy for functional obstruction of anastomosis.  GI: Intermittent nausea and severe abd pain/discomfort, s/p decompression for cecal dilatation 11/21. She has had multiple BMs. Noted back to OR 11/28 for exp lap/R colectomy/end ileostomy for functional obstruction of anastomosis. TG WNL, prealbumin decreased to 11.7 . She is tolerating full liquids and needs assistance with meals  Endo: no hx DM - SSI d/c'ed 11/22  Lytes:  Hyponatremia improved - Na 134  K+ 4.3(goal >4 for ileus) 12/1 Phos 2.9, Mag  1.9  Renal: SCr stable, CrCL ~50 ml/min. NS  At [facility]  Pulm: RA  Cards: hx HTN. VSS .(lisinopril PTA). PRN hydralazine for SBP > 180  Hepatobil: LFTs slightly elevated.  TG 75 Alb 1.9, prealb 11.7  Heme/Onc: hx breast cancer. New dx colon cancer  Hgb low but stable. plts WNL.   Neuro: A&O - scheduled APAP  ID: afeb. WBC slightly elevated.   Best Practices: SQ heparin, SCDs  TPN Access: PICC placed 12/11/12  TPN day#: 13 (11/18 >> )  Current Nutrition:  Clinimix E 5/15 at 80 ml/hr + IVFE at 10 ml/hr providing 1843 kCal and 96 gm protein daily Full liquid diet  Estimated Nutritional Needs:  1700 - 1900 kCal; 80-95 g protein daily  Plan:  - Decrease Clinimix E 5/15 to 60 ml/hr, continue IVFE at 10 ml/hr - F/u advancement of diet  Thanks for allowing pharmacy to be a part of this patient's care.  Talbert Cage, PharmD Clinical Pharmacist, [phone]  12/27/2012 8:45 AM

## 2012-12-27 NOTE — Progress Notes (Signed)
5 Days Post-Op  Subjective: Pt ambulated 2x yesterday.  Tolerated liquids.  Tired of eating red jello, needs assist with meals.    Objective: Vital signs in last 24 hours: Temp:  [97.3 F (36.3 C)-97.9 F (36.6 C)] 97.9 F (36.6 C) (12/03 0440) Pulse Rate:  [90-106] 90 (12/03 0226) Resp:  [18-26] 20 (12/03 0440) BP: (123-158)/(43-63) 146/52 mmHg (12/03 0440) SpO2:  [96 %-100 %] 100 % (12/03 0440) Last BM Date: 12/26/12  Intake/Output from previous day: 12/02 0701 - 12/03 0700 In: 2315.2 [P.O.:40; I.V.:413.7; TPN:1861.5] Out: 1600 [Urine:550; Stool:1050] Intake/Output this shift:   PE:  Gen: Alert, NAD, pleasant  Abd: Soft, appropriate tenderness, ND, +BS, no HSM, midline wound c/d/i.  Stoma is pink and viable, minimal stool is ostomy bag(just emptied)    Lab Results:  No results found for this basename: WBC, HGB, HCT, PLT,  in the last 72 hours BMET  Recent Labs  12/26/12 0338 12/27/12 0515  NA 129* 134*  K 4.4 4.3  CL 97 100  CO2 26 27  GLUCOSE 112* 105*  BUN 18 18  CREATININE 0.40* 0.42*  CALCIUM 8.5 9.0   PT/INR No results found for this basename: LABPROT, INR,  in the last 72 hours ABG No results found for this basename: PHART, PCO2, PO2, HCO3,  in the last 72 hours  Studies/Results: No results found.  Anti-infectives: Anti-infectives   Start     Dose/Rate Route Frequency Ordered Stop   12/23/12 0600  ertapenem (INVANZ) 1 g in sodium chloride 0.9 % 50 mL IVPB     1 g 100 mL/hr over 30 Minutes Intravenous On call to O.R. 12/22/12 2037 12/23/12 0903   12/22/12 1615  cefOXitin (MEFOXIN) 2 g in dextrose 5 % 50 mL IVPB  Status:  Discontinued     2 g 100 mL/hr over 30 Minutes Intravenous  Once 12/22/12 1601 12/22/12 2032   12/07/12 0600  cefoTEtan (CEFOTAN) 2 g in dextrose 5 % 50 mL IVPB     2 g 100 mL/hr over 30 Minutes Intravenous On call to O.R. 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 Ileus has resolved.  Increased ileostomy output(1025ml/24hr). Continue to monitor output 2. pain control, antiemetics  3. Advance to full liquid diet, assist with meals 4. Mobilize, IS, SCD's, and heparin  5. Wean off TPN once oral intake is adequate 6. Pt/OT eval and treat   LOS: 22 days    Billi Bright ANP-BC 12/27/2012 8:01 AM

## 2012-12-28 LAB — COMPREHENSIVE METABOLIC PANEL
ALT: 48 U/L — ABNORMAL HIGH (ref 0–35)
Albumin: 2.1 g/dL — ABNORMAL LOW (ref 3.5–5.2)
BUN: 14 mg/dL (ref 6–23)
Calcium: 9 mg/dL (ref 8.4–10.5)
Creatinine, Ser: 0.46 mg/dL — ABNORMAL LOW (ref 0.50–1.10)
Total Bilirubin: 0.2 mg/dL — ABNORMAL LOW (ref 0.3–1.2)
Total Protein: 5.4 g/dL — ABNORMAL LOW (ref 6.0–8.3)

## 2012-12-28 LAB — PHOSPHORUS: Phosphorus: 3.3 mg/dL (ref 2.3–4.6)

## 2012-12-28 LAB — MAGNESIUM: Magnesium: 2 mg/dL (ref 1.5–2.5)

## 2012-12-28 MED ORDER — TRAMADOL HCL 50 MG PO TABS: 50.0000 mg | ORAL_TABLET | Freq: Four times a day (QID) | ORAL | Status: DC | PRN

## 2012-12-28 MED ORDER — TRACE MINERALS CR-CU-F-FE-I-MN-MO-SE-ZN IV SOLN
INTRAVENOUS | Status: AC
  Administered 2012-12-28: 18:00:00 via INTRAVENOUS
  Filled 2012-12-28: qty 1000

## 2012-12-28 MED ORDER — FAT EMULSION 20 % IV EMUL
250.0000 mL | INTRAVENOUS | Status: AC
  Administered 2012-12-28: 250 mL via INTRAVENOUS
  Filled 2012-12-28: qty 250

## 2012-12-28 MED ORDER — ENSURE COMPLETE PO LIQD
237.0000 mL | Freq: Three times a day (TID) | ORAL | Status: DC
  Administered 2012-12-28 – 2012-12-31 (×10): 237 mL via ORAL

## 2012-12-28 NOTE — Clinical Social Work Note (Signed)
CSW received call from [facility] SNF stating that pt's husband was interested in their facility once his wife had been medically discharged from [facility]. Later in the day, CSW received phone call from pt's RN stating that pt's husband was at bedside and was requesting SNF bed offers list from CSW. CSW met with pt's husband at bedside and discussed SNF placement bed offers. Pt was walking hallway with Nurse Tech. [name] stated that [facility] could not offer pt a private room. [name] was encouraged to call the facilities on the bed offers list and enquire about private rooms. CSW to follow up on 12/29/2012.  Darlyn Chamber, LCSWA Clinical Social Worker [phone]

## 2012-12-28 NOTE — Progress Notes (Signed)
6 Days Post-Op  Subjective: Pt eating breakfast, able to feed herself without evident problems.  She has a good appetite but does not care for the food here.  Voiding well.  abd pain overall improving.  Denies n/v.     Objective: Vital signs in last 24 hours: Temp:  [97.5 F (36.4 C)-98.6 F (37 C)] 97.9 F (36.6 C) (12/04 0522) Pulse Rate:  [60-96] 60 (12/04 0522) Resp:  [18-21] 18 (12/04 0522) BP: (134-153)/(49-90) 146/90 mmHg (12/04 0522) SpO2:  [98 %-100 %] 98 % (12/04 0522) Weight:  [134 lb 0.6 oz (60.8 kg)] 134 lb 0.6 oz (60.8 kg) (12/04 0522) Last BM Date: 12/27/12  Intake/Output from previous day: 12/03 0701 - 12/04 0700 In: 2875.3 [P.O.:480; I.V.:420; TPN:1975.3] Out: 2950 [Urine:1400; Stool:1550] Intake/Output this shift: Total I/O In: -  Out: 325 [Urine:325]  PE:  Gen: Alert, NAD, pleasant  Abd: Soft, appropriate tenderness, ND, +BS, no HSM, midline wound c/d/i. Stoma is pink and viable, liquid stool in bag.   Lab Results:  No results found for this basename: WBC, HGB, HCT, PLT,  in the last 72 hours BMET  Recent Labs  12/27/12 0515 12/28/12 0500  NA 134* 132*  K 4.3 4.2  CL 100 99  CO2 27 26  GLUCOSE 105* 113*  BUN 18 14  CREATININE 0.42* 0.46*  CALCIUM 9.0 9.0   PT/INR No results found for this basename: LABPROT, INR,  in the last 72 hours ABG No results found for this basename: PHART, PCO2, PO2, HCO3,  in the last 72 hours  Studies/Results: No results found.  Anti-infectives: Anti-infectives   Start     Dose/Rate Route Frequency Ordered Stop   12/23/12 0600  ertapenem (INVANZ) 1 g in sodium chloride 0.9 % 50 mL IVPB     1 g 100 mL/hr over 30 Minutes Intravenous On call to O.R. 12/22/12 2037 12/23/12 0903   12/22/12 1615  cefOXitin (MEFOXIN) 2 g in dextrose 5 % 50 mL IVPB  Status:  Discontinued     2 g 100 mL/hr over 30 Minutes Intravenous  Once 12/22/12 1601 12/22/12 2032   12/07/12 0600  cefoTEtan (CEFOTAN) 2 g in dextrose 5 % 50 mL IVPB      2 g 100 mL/hr over 30 Minutes Intravenous On call to O.R. 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 Ileus has resolved. Increased ileostomy output(1526ml/24hr).  Advance diet today, if the output continues to be high, consider starting Imodium/fiber  2. pain control, antiemetics, add tramadol  3. Advance to soft diet, assist with meals.  Add vanilla ensure between meals. She doesn't like the food, wants homemade soup, encouraged family to bring in food as desired.  Wean off TPN 4. Mobilize, IS, SCD's, and heparin  5. Pt/OT eval and treat   LOS: 23 days    Bohdan Macho ANP-BC 12/28/2012 8:39 AM

## 2012-12-28 NOTE — Progress Notes (Signed)
Agree with A&P of ER,NP. She was up in chair when I came in no c/o except feels weak. Will recheck cbc iin am

## 2012-12-28 NOTE — Progress Notes (Signed)
Subjective: Feeling better today but husband seems to be very upset that she is not receiving more attentive care without calling on the call button.  Spoke with nursing assistant this morning, Onalee Hua, who I believe has been quite caring and attentive.  Patient's husband will likely issue a complaint today, though I have no proof that care has been lackluster from nursing or the nurses aides.  Hospitalization has been long and very strenuous and stressful for patient's husband.  I encouraged him to speak his concerns to the unit clerk who can help address his complaints  Objective: Weight change:   Intake/Output Summary (Last 24 hours) at 12/28/12 0816 Last data filed at 12/28/12 0752  Gross per 24 hour  Intake 2875.33 ml  Output   3275 ml  Net -399.67 ml   Filed Vitals:   12/27/12 1032 12/27/12 1403 12/27/12 2231 12/28/12 0522  BP: 134/49 137/56 153/56 146/90  Pulse: 96 91 83 60  Temp: 98 F (36.7 C) 98.6 F (37 C) 97.5 F (36.4 C) 97.9 F (36.6 C)  TempSrc: Oral Oral Oral Oral  Resp: 21 20 20 18   Height:      Weight:    60.8 kg (134 lb 0.6 oz)  SpO2: 99% 100% 98% 98%   Gen: Alert, NAD, pleasant  Abd: Soft, appropriate tenderness, ND, +BS, no HSM, midline wound clean with healthy appearing tissue and some bleeding, minimal slough, no worrisome drainage   Lab Results: Results for orders placed during the hospital encounter of 12/05/12 (from the past 48 hour(s))  BASIC METABOLIC PANEL     Status: Abnormal   Collection Time    12/27/12  5:15 AM      Result Value Range   Sodium 134 (*) 135 - 145 mEq/L   Potassium 4.3  3.5 - 5.1 mEq/L   Chloride 100  96 - 112 mEq/L   CO2 27  19 - 32 mEq/L   Glucose, Bld 105 (*) 70 - 99 mg/dL   BUN 18  6 - 23 mg/dL   Creatinine, Ser 8.29 (*) 0.50 - 1.10 mg/dL   Calcium 9.0  8.4 - 56.2 mg/dL   GFR calc non Af Amer >90  >90 mL/min   GFR calc Af Amer >90  >90 mL/min   Comment: (NOTE)     The eGFR has been calculated using the CKD EPI equation.      This calculation has not been validated in all clinical situations.     eGFR's persistently <90 mL/min signify possible Chronic Kidney     Disease.  COMPREHENSIVE METABOLIC PANEL     Status: Abnormal   Collection Time    12/28/12  5:00 AM      Result Value Range   Sodium 132 (*) 135 - 145 mEq/L   Potassium 4.2  3.5 - 5.1 mEq/L   Chloride 99  96 - 112 mEq/L   CO2 26  19 - 32 mEq/L   Glucose, Bld 113 (*) 70 - 99 mg/dL   BUN 14  6 - 23 mg/dL   Creatinine, Ser 1.30 (*) 0.50 - 1.10 mg/dL   Calcium 9.0  8.4 - 86.5 mg/dL   Total Protein 5.4 (*) 6.0 - 8.3 g/dL   Albumin 2.1 (*) 3.5 - 5.2 g/dL   AST 46 (*) 0 - 37 U/L   ALT 48 (*) 0 - 35 U/L   Alkaline Phosphatase 138 (*) 39 - 117 U/L   Total Bilirubin 0.2 (*) 0.3 - 1.2 mg/dL  GFR calc non Af Amer >90  >90 mL/min   GFR calc Af Amer >90  >90 mL/min   Comment: (NOTE)     The eGFR has been calculated using the CKD EPI equation.     This calculation has not been validated in all clinical situations.     eGFR's persistently <90 mL/min signify possible Chronic Kidney     Disease.  MAGNESIUM     Status: None   Collection Time    12/28/12  5:00 AM      Result Value Range   Magnesium 2.0  1.5 - 2.5 mg/dL  PHOSPHORUS     Status: None   Collection Time    12/28/12  5:00 AM      Result Value Range   Phosphorus 3.3  2.3 - 4.6 mg/dL    Studies/Results: No results found. Medications: Scheduled Meds: . acetaminophen  650 mg Oral Q6H  . citalopram  10 mg Oral Daily  . heparin subcutaneous  5,000 Units Subcutaneous Q12H  . pantoprazole (PROTONIX) IV  40 mg Intravenous Q12H   Continuous Infusions: . sodium chloride 20 mL/hr (12/26/12 0812)  . Marland KitchenTPN (CLINIMIX-E) Adult 60 mL/hr at 12/28/12 0700   And  . fat emulsion 250 mL (12/28/12 0700)   PRN Meds:.hydrALAZINE, HYDROmorphone (DILAUDID) injection, LORazepam, menthol-cetylpyridinium, phenol, promethazine, simethicone, sodium chloride, white petrolatum  Assessment/Plan: Principal  Problem:   GI bleed Active Problems:   Pacemaker implanted 08/28/12   Essential hypertension, benign   D (diarrhea)   Colonic mass   Gastric bleed   Ileus, postoperative  Plan:  1. Ileus resolved clinically. Full liquid diet. Advance diet as per surgery  2. Continue mobilization  3. Hopefully can continue to wean TPN today  4. Continue PTOT. Severe deconditioning. Please assist with meals.  5. Disposition: Would like patient to transition to [facility] skilled nursing facility prior to returning home  6. Patient with anxiety and depressed mood.  Continue citalopram 10 milligrams daily and possibly increase.  Started yesterday     LOS: 23 days   Pearla Dubonnet, MD 12/28/2012, 8:16 AM

## 2012-12-28 NOTE — Progress Notes (Signed)
PARENTERAL NUTRITION CONSULT NOTE - Follow Up  Pharmacy Consult:  TPN Indication: Prolonged post-op ileus  Allergies  Allergen Reactions  . Codeine Nausea And Vomiting  . Zofran [Ondansetron Hcl]    Patient Measurements: Height: 5\' 5"  (165.1 cm) Weight: 134 lb 0.6 oz (60.8 kg) IBW/kg (Calculated) : 57  Vital Signs: Temp: 97.9 F (36.6 C) (12/04 0522) Temp src: Oral (12/04 0522) BP: 146/90 mmHg (12/04 0522) Pulse Rate: 60 (12/04 0522)  Labs: No results found for this basename: WBC, HGB, HCT, PLT, APTT, INR,  in the last 72 hours  Recent Labs  12/26/12 0338 12/27/12 0515 12/28/12 0500  NA 129* 134* 132*  K 4.4 4.3 4.2  CL 97 100 99  CO2 26 27 26   GLUCOSE 112* 105* 113*  BUN 18 18 14   CREATININE 0.40* 0.42* 0.46*  CALCIUM 8.5 9.0 9.0  MG  --   --  2.0  PHOS  --   --  3.3  PROT  --   --  5.4*  ALBUMIN  --   --  2.1*  AST  --   --  46*  ALT  --   --  48*  ALKPHOS  --   --  138*  BILITOT  --   --  0.2*   Estimated Creatinine Clearance: 49.6 ml/min (by C-G formula based on Cr of 0.46).  No results found for this basename: GLUCAP,  in the last 72 hours  Insulin Requirements in the past 24 hours:  None - SSI d/c'ed  Current Nutrition:  Clinimix E 5/15 at 80 ml/hr + IVFE at 10 ml/hr providing 1843 kCal and 96 gm protein daily Dysphagia III diet  Estimated Nutritional Needs:  1700 - 1900 kCal; 80-95 g protein daily  Assessment:  89 YOF s/p lap-assisted transverse colectomy on 12/07/12 for partially obstruction transverse colon mass. TPN started for for post-op ileus. Pt back to OR 11/28 for exp lap/R colectomy/end ileostomy for functional obstruction of anastomosis.  GI: Intermittent nausea and severe abd pain/discomfort, s/p decompression for cecal dilatation 11/21. Noted back to OR 11/28 for exp lap/R colectomy/end ileostomy for functional obstruction of anastomosis. TG WNL, prealbumin decreased to 11.7. Dysphagia 3 diet ordered. No intake noted on flowsheet but  PA notes pt eating breakfast. Ileus noted to be resolved. Advancing diet; wean TPN.  Endo: no hx DM - SSI d/c'ed 11/22. BMET glucose 113.  Lytes:  Hyponatremia improved - Na 132,  K+ 4.2 (goal >4 for ileus), Phos 3.3, Mag 2  Renal: SCr 0.46. CrCl 49.  Pulm: RA 98%  Cards: hx HTN. 146/90, HR 60 .(lisinopril PTA). PRN hydralazine for SBP > 180  Hepatobil: LFTs slightly elevated AST/ALT 46/48.  TG 75, Alb 2.1, prealb 11.7  Heme/Onc: hx breast cancer. New dx colon cancer  Hgb low but stable. plts WNL.   Neuro: A&O - scheduled APAP  ID: afeb. WBC slightly elevated.   Best Practices: SQ heparin, SCDs  TPN Access: PICC placed 12/11/12  TPN day#: 14 (11/18 >> )    Plan:  - Decrease Clinimix E 5/15 to 40 ml/hr, continue IVFE at 10 ml/hr - Weaning TPN as per NP note - F/u advancement of diet  Kurt Azimi S. Merilynn Finland, PharmD, [facility] Clinical Staff Pharmacist Pager [phone]  12/28/2012 12:05 PM

## 2012-12-28 NOTE — Progress Notes (Signed)
Physical Therapy Treatment Patient Details Name: Tamara [name] MRN: [id number] DOB: [birth year] Today's Date: 12/28/2012 Time: 1324-4010 PT Time Calculation (min): 28 min  PT Assessment / Plan / Recommendation  History of Present Illness 79 y/o WF admitted with GI bleed and s/p partial colectomy after sigmoidoscopy revealed mass consistent with malignancy.;11/28 return to surgery due to anastomotic obstruction; s/p Rt colectomy with ileostomy   PT Comments   Pt progressing with mobility however continues to complain of weakness/fatigue. Pt is not a candidate for LTACH thus will be updating d/c plans to SNF for continued rehab. Pt will benefit from continued PT to increase mobility and strength and maximize functional I.   Follow Up Recommendations  SNF     Does the patient have the potential to tolerate intense rehabilitation     Barriers to Discharge        Equipment Recommendations  Rolling walker with 5" wheels    Recommendations for Other Services    Frequency Min 2X/week   Progress towards PT Goals Progress towards PT goals: Progressing toward goals  Plan Discharge plan needs to be updated    Precautions / Restrictions Precautions Precautions: Fall Precaution Comments: abd surgery; ileostomy Restrictions Weight Bearing Restrictions: No   Pertinent Vitals/Pain 3/10 abdominal pain ; pt repositioned for comfort post tx.    Mobility  Bed Mobility Bed Mobility: Rolling Right;Right Sidelying to Sit;Sitting - Scoot to Edge of Bed;Sit to Sidelying Right;Scooting to [facility] Rolling Right: 4: Min guard;With rail Right Sidelying to Sit: 4: Min assist;With rails Sitting - Scoot to Edge of Bed: 4: Min guard Scooting to [facility]: With rail;5: Supervision Details for Bed Mobility Assistance: Assisted with LE's when going fromsit to sidelying; assistance to bring trunk upright with sidelying to sit; vc's for technique. HOB flat, no rails Transfers Transfers: Sit to Stand;Stand to Sit Sit  to Stand: 4: Min guard;From bed;With upper extremity assist Stand to Sit: 4: Min guard;To bed;With upper extremity assist Details for Transfer Assistance: vc's for technique.  Ambulation/Gait Ambulation/Gait Assistance: 4: Min guard Ambulation Distance (Feet): 150 Feet Assistive device: Rolling walker Ambulation/Gait Assistance Details: pt demonstrated pacing today due to fatigue by taking standing rest breaks.  cues for upright posture. no adverse sx's with ambulation besides "feeling weak" Gait Pattern: Decreased stride length;Shuffle;Step-to pattern Gait velocity: decreased    Exercises     PT Diagnosis:    PT Problem List:   PT Treatment Interventions:     PT Goals (current goals can now be found in the care plan section)    Visit Information  Last PT Received On: 12/28/12 Assistance Needed: +1 History of Present Illness: 79 y/o WF admitted with GI bleed and s/p partial colectomy after sigmoidoscopy revealed mass consistent with malignancy.;11/28 return to surgery due to anastomotic obstruction; s/p Rt colectomy with ileostomy    Subjective Data      Cognition  Cognition Arousal/Alertness: Lethargic Behavior During Therapy: [facility] for tasks assessed/performed Overall Cognitive Status: Within Functional Limits for tasks assessed    Balance     End of Session PT - End of Session Equipment Utilized During Treatment: Gait belt Activity Tolerance: Patient limited by lethargy Patient left: in bed;with call bell/phone within reach Nurse Communication: Mobility status (emptied colostomy bag prior to ambulation)   GP     Hodaya Curto, SPTA 12/28/2012, 2:44 PM

## 2012-12-28 NOTE — Progress Notes (Signed)
Pt was encouraged to ambulate but she refused stating she is too sick inform pt that this would be for her benefit. No response. Ilean Skill LPN

## 2012-12-29 LAB — CBC
Hemoglobin: 7.7 g/dL — ABNORMAL LOW (ref 12.0–15.0)
MCHC: 34.1 g/dL (ref 30.0–36.0)
MCV: 89 fL (ref 78.0–100.0)
RBC: 2.54 MIL/uL — ABNORMAL LOW (ref 3.87–5.11)

## 2012-12-29 LAB — PREPARE RBC (CROSSMATCH)

## 2012-12-29 MED ORDER — DIPHENOXYLATE-ATROPINE 2.5-0.025 MG PO TABS
1.0000 | ORAL_TABLET | Freq: Every day | ORAL | Status: DC
  Administered 2012-12-29 – 2013-01-01 (×3): 1 via ORAL
  Filled 2012-12-29 (×3): qty 1

## 2012-12-29 MED ORDER — FAT EMULSION 20 % IV EMUL
250.0000 mL | INTRAVENOUS | Status: AC
  Administered 2012-12-29: 250 mL via INTRAVENOUS
  Filled 2012-12-29: qty 250

## 2012-12-29 MED ORDER — FUROSEMIDE 10 MG/ML IJ SOLN
20.0000 mg | Freq: Once | INTRAMUSCULAR | Status: AC
  Administered 2012-12-29: 20 mg via INTRAVENOUS
  Filled 2012-12-29 (×2): qty 2

## 2012-12-29 MED ORDER — TRACE MINERALS CR-CU-F-FE-I-MN-MO-SE-ZN IV SOLN
INTRAVENOUS | Status: AC
  Administered 2012-12-29: 18:00:00 via INTRAVENOUS
  Filled 2012-12-29: qty 1000

## 2012-12-29 NOTE — Progress Notes (Signed)
NUTRITION FOLLOW UP  Intervention:   1. Parenteral nutrition; continued management by PharmD.  Pt has initiated wean from TPN as PO diet has progressed.  2.  Brief education; provided.  Encouraged foods to thicken stool.  Encourage intake of beverages for hydration. Provided written materials for pt.   Nutrition Dx:   Inadequate oral intake related to altered GI function as evidenced by very limited PO intake; ongoing  Goal:   Intake to meet >90% of estimated nutrition needs. Met with TPN.  Monitor:   Wt trends, labs, I/O's, tolerance of TPN, NPO status  Assessment:   PMHx significant for breast CA and colitis. Pt was recently admitted for colitis, and presented to ER 2/2 rectal bleeding. She states that since her last discharge she has been having dark, liquidy stools.   Underwent flex sig on 11/12 - findings revealed fungating ulcerated nonobstructing mass (suspected malignancy) in transverse colon -s/p biopsies and tattooing and internal hemorrhoids.  Diet advanced to Dysphagia 3 with 50% meal completion.  Pt has continued with increased ileostomy output.  MD has added lomotil. She is open to foods such as peanut butter and jelly sandwiches, applesauce, saltines, and yogurt.   Patient is receiving TPN with Clinimix E 5/15 @ 40 ml/hr and lipids @ 5 ml/hr. Provides 921 kcal, and 48 grams protein per day.    Height: Ht Readings from Last 1 Encounters:  12/05/12 5\' 5"  (1.651 m)    Weight Status:   Wt Readings from Last 1 Encounters:  12/28/12 134 lb 0.6 oz (60.8 kg)    Re-estimated needs:  Kcal: 1700-1900 Protein: 80-95 g Fluid: 1.7-1.9 L/d  Skin: incision on abdomen   Diet Order: Dysphagia 3   Intake/Output Summary (Last 24 hours) at 12/29/12 1547 Last data filed at 12/29/12 1352  Gross per 24 hour  Intake 2008.71 ml  Output   2450 ml  Net -441.29 ml    Last BM: 12/5, ileostomy output  Labs:   Recent Labs Lab 12/25/12 0435 12/26/12 0338 12/27/12 0515  12/28/12 0500  NA 129* 129* 134* 132*  K 4.3 4.4 4.3 4.2  CL 98 97 100 99  CO2 25 26 27 26   BUN 19 18 18 14   CREATININE 0.42* 0.40* 0.42* 0.46*  CALCIUM 8.7 8.5 9.0 9.0  MG 1.9  --   --  2.0  PHOS 2.9  --   --  3.3  GLUCOSE 112* 112* 105* 113*    CBG (last 3)  No results found for this basename: GLUCAP,  in the last 72 hours  Scheduled Meds: . acetaminophen  650 mg Oral Q6H  . citalopram  10 mg Oral Daily  . diphenoxylate-atropine  1 tablet Oral Daily  . feeding supplement (ENSURE COMPLETE)  237 mL Oral TID BM  . pantoprazole (PROTONIX) IV  40 mg Intravenous Q12H    Continuous Infusions: . sodium chloride 20 mL/hr at 12/28/12 2152  . Marland KitchenTPN (CLINIMIX-E) Adult 40 mL/hr at 12/29/12 1122   And  . fat emulsion 250 mL (12/29/12 1122)  . Marland KitchenTPN (CLINIMIX-E) Adult     And  . fat emulsion      Loyce Dys, MS RD LDN Clinical Inpatient Dietitian Pager: [phone] Weekend/After hours pager: [phone]

## 2012-12-29 NOTE — Progress Notes (Signed)
PARENTERAL NUTRITION CONSULT NOTE - Follow Up  Pharmacy Consult:  TPN Indication: Prolonged post-op ileus  Allergies  Allergen Reactions  . Codeine Nausea And Vomiting  . Zofran [Ondansetron Hcl]    Patient Measurements: Height: 5\' 5"  (165.1 cm) Weight: 134 lb 0.6 oz (60.8 kg) IBW/kg (Calculated) : 57  Vital Signs: Temp: 97.9 F (36.6 C) (12/05 0820) Temp src: Oral (12/05 0820) BP: 184/56 mmHg (12/05 0844) Pulse Rate: 102 (12/05 0820)  Labs:  Recent Labs  12/29/12 0540  WBC 7.9  HGB 7.7*  HCT 22.6*  PLT 321    Recent Labs  12/27/12 0515 12/28/12 0500  NA 134* 132*  K 4.3 4.2  CL 100 99  CO2 27 26  GLUCOSE 105* 113*  BUN 18 14  CREATININE 0.42* 0.46*  CALCIUM 9.0 9.0  MG  --  2.0  PHOS  --  3.3  PROT  --  5.4*  ALBUMIN  --  2.1*  AST  --  46*  ALT  --  48*  ALKPHOS  --  138*  BILITOT  --  0.2*   Estimated Creatinine Clearance: 49.6 ml/min (by C-G formula based on Cr of 0.46).  No results found for this basename: GLUCAP,  in the last 72 hours  Insulin Requirements in the past 24 hours:  None - SSI d/c'ed  Current Nutrition:  Clinimix E 5/15 at 40 ml/hr + IVFE at 10 ml/hr Dysphagia III diet  Estimated Nutritional Needs:  1700 - 1900 kCal; 80-95 g protein daily  Assessment:  21 YOF s/p lap-assisted transverse colectomy on 12/07/12 for partially obstruction transverse colon mass. TPN started for for post-op ileus. Pt back to OR 11/28 for exp lap/R colectomy/end ileostomy for functional obstruction of anastomosis.  GI: Intermittent nausea and severe abd pain/discomfort, s/p decompression for cecal dilatation 11/21. Noted back to OR 11/28 for exp lap/R colectomy/end ileostomy for functional obstruction of anastomosis. TG WNL, prealbumin decreased to 11.7. Dysphagia 3 diet ordered. No intake noted on flowsheet, MD notes pt has distaste for diet.Plan to cont to wean TPN.  Endo: no hx DM - SSI d/c'ed 11/22. BMET glucose 113.  Lytes:  Hyponatremia  improved - Na 132,  K+ 4.2 (goal >4 for ileus), Phos 3.3, Mag 2  Renal: SCr 0.46. CrCl 49.  Pulm: RA 98%  Cards: hx HTN. 184/56, HR 102 .(lisinopril PTA). PRN hydralazine for SBP > 180  Hepatobil: LFTs slightly elevated AST/ALT 46/48.  TG 75, Alb 2.1, prealb 11.7  Heme/Onc: hx breast cancer. New dx colon cancer  Hgb 7.7 today  Neuro: A&O - scheduled APAP  ID: afeb. WBC 7.9  Best Practices: SQ heparin dced, SCDs  TPN Access: PICC placed 12/11/12  TPN day#: 15 (11/18 >> )    Plan:  - Cont Clinimix E 5/15 at 40 ml/hr, dec IVFE to 5 ml/hr - Weaning TPN as per NP note - F/u advancement of diet  Talbert Cage, PharmD Clinical Staff Pharmacist Pager [phone]  12/29/2012 8:47 AM

## 2012-12-29 NOTE — Progress Notes (Signed)
7 Days Post-Op   Assessment: s/p Procedure(s): EXPLORATORY LAPAROTOMY , RIGHT Colectomy IILEOSTOMY Patient Active Problem List   Diagnosis Date Noted  . Gastric bleed 12/11/2012  . Ileus, postoperative 12/11/2012  . Colonic mass 12/08/2012  . D (diarrhea) 12/06/2012  . GI bleed 12/05/2012  . Essential hypertension, benign 11/27/2012  . Colitis 11/26/2012  . Nausea and vomiting 11/26/2012  . LLQ abdominal pain 11/26/2012  . Diastolic dysfunction- 2 09/06/2012  . Syncope 08/30/2012  . Pacemaker implanted 08/28/12 08/30/2012  . Cancer of breast-  08/30/2012  . Elevated blood pressure 08/30/2012  . Sinus arrest 08/28/2012    Slow progress Anemic, to receive transfusion today per Dr Kevan Ny Somewhat high output from ostomy, will need to watch for volume depletion  Plan: Recheck labs in am. Can add some Lomotil to slow iliostomy  Subjective: Feels weak but OK, Husband notes she seems to be improving. Ate fair amount for breakfast  Objective: Vital signs in last 24 hours: Temp:  [97.8 F (36.6 C)-99 F (37.2 C)] 97.9 F (36.6 C) (12/05 0820) Pulse Rate:  [92-102] 102 (12/05 0820) Resp:  [16] 16 (12/05 0502) BP: (143-190)/(56-77) 184/56 mmHg (12/05 0844) SpO2:  [98 %] 98 % (12/05 0820)   Intake/Output from previous day: 12/04 0701 - 12/05 0700 In: 2367 [I.V.:591; TPN:1776] Out: 2776 [Urine:676; Stool:2100]  General appearance: alert, cooperative, fatigued and no distress GI: Soft, mild tender, no rebound, seems to be improving, ostomy viable  Incision: healing well  Lab Results:   Recent Labs  12/29/12 0540  WBC 7.9  HGB 7.7*  HCT 22.6*  PLT 321   BMET  Recent Labs  12/27/12 0515 12/28/12 0500  NA 134* 132*  K 4.3 4.2  CL 100 99  CO2 27 26  GLUCOSE 105* 113*  BUN 18 14  CREATININE 0.42* 0.46*  CALCIUM 9.0 9.0    MEDS, Scheduled . acetaminophen  650 mg Oral Q6H  . citalopram  10 mg Oral Daily  . feeding supplement (ENSURE COMPLETE)  237 mL Oral  TID BM  . furosemide  20 mg Intravenous Once  . pantoprazole (PROTONIX) IV  40 mg Intravenous Q12H    Studies/Results: No results found.    LOS: 24 days     Currie Paris, MD, [facility], [place] [phone]   12/29/2012 8:59 AM

## 2012-12-29 NOTE — Progress Notes (Signed)
Agree with SPTA.    Marigene Erler, PT [phone]  

## 2012-12-29 NOTE — Progress Notes (Addendum)
Subjective: Abdomen is without pain today.  Not tolerating the diet well in terms of distaste for it.  She is ambulating.  Still tapering her TPN.  Still not stable for transfer to SNF.  Should be accomplished after the weekend.  Hospitalization has been long and tenuous and she needs to be definitely stable prior to transfer.  She is quite anemic this morning and will transfuse.  Discontinue heparin and use PAS hose  Objective: Weight change:   Intake/Output Summary (Last 24 hours) at 12/29/12 0645 Last data filed at 12/29/12 0600  Gross per 24 hour  Intake 3772.33 ml  Output   2776 ml  Net 996.33 ml   Filed Vitals:   12/28/12 1352 12/28/12 2047 12/29/12 0502 12/29/12 0530  BP: 143/56 156/77 160/68 152/68  Pulse: 92 92 95   Temp: 99 F (37.2 C) 97.8 F (36.6 C) 98.3 F (36.8 C)   TempSrc: Oral Oral Oral   Resp: 16 16 16    Height:      Weight:      SpO2: 98% 98% 98%    Gen: Alert, NAD, pleasant Skin: Pale Lungs clear to auscultation Cardiac: Regular rate and rhythm  Abd: Soft, appropriate tenderness, ND, +BS, midline wound clean with healthy appearing tissue Extremities: Extremities normal, atraumatic, no cyanosis or edema Neuro: Alert and oriented, nonfocal  Lab Results: Results for orders placed during the hospital encounter of 12/05/12 (from the past 48 hour(s))  COMPREHENSIVE METABOLIC PANEL     Status: Abnormal   Collection Time    12/28/12  5:00 AM      Result Value Range   Sodium 132 (*) 135 - 145 mEq/L   Potassium 4.2  3.5 - 5.1 mEq/L   Chloride 99  96 - 112 mEq/L   CO2 26  19 - 32 mEq/L   Glucose, Bld 113 (*) 70 - 99 mg/dL   BUN 14  6 - 23 mg/dL   Creatinine, Ser 8.11 (*) 0.50 - 1.10 mg/dL   Calcium 9.0  8.4 - 91.4 mg/dL   Total Protein 5.4 (*) 6.0 - 8.3 g/dL   Albumin 2.1 (*) 3.5 - 5.2 g/dL   AST 46 (*) 0 - 37 U/L   ALT 48 (*) 0 - 35 U/L   Alkaline Phosphatase 138 (*) 39 - 117 U/L   Total Bilirubin 0.2 (*) 0.3 - 1.2 mg/dL   GFR calc non Af Amer >90   >90 mL/min   GFR calc Af Amer >90  >90 mL/min   Comment: (NOTE)     The eGFR has been calculated using the CKD EPI equation.     This calculation has not been validated in all clinical situations.     eGFR's persistently <90 mL/min signify possible Chronic Kidney     Disease.  MAGNESIUM     Status: None   Collection Time    12/28/12  5:00 AM      Result Value Range   Magnesium 2.0  1.5 - 2.5 mg/dL  PHOSPHORUS     Status: None   Collection Time    12/28/12  5:00 AM      Result Value Range   Phosphorus 3.3  2.3 - 4.6 mg/dL  CBC     Status: Abnormal   Collection Time    12/29/12  5:40 AM      Result Value Range   WBC 7.9  4.0 - 10.5 K/uL   RBC 2.54 (*) 3.87 - 5.11 MIL/uL   Hemoglobin 7.7 (*)  12.0 - 15.0 g/dL   HCT 16.1 (*) 09.6 - 04.5 %   MCV 89.0  78.0 - 100.0 fL   MCH 30.3  26.0 - 34.0 pg   MCHC 34.1  30.0 - 36.0 g/dL   RDW 40.9 (*) 81.1 - 91.4 %   Platelets 321  150 - 400 K/uL    Studies/Results: No results found. Medications: Scheduled Meds: . acetaminophen  650 mg Oral Q6H  . citalopram  10 mg Oral Daily  . feeding supplement (ENSURE COMPLETE)  237 mL Oral TID BM  . heparin subcutaneous  5,000 Units Subcutaneous Q12H  . pantoprazole (PROTONIX) IV  40 mg Intravenous Q12H   Continuous Infusions: . sodium chloride 20 mL/hr at 12/28/12 2152  . Marland KitchenTPN (CLINIMIX-E) Adult 40 mL/hr at 12/28/12 1754   And  . fat emulsion 250 mL (12/28/12 1755)   PRN Meds:.hydrALAZINE, HYDROmorphone (DILAUDID) injection, LORazepam, menthol-cetylpyridinium, phenol, promethazine, simethicone, sodium chloride, traMADol, white petrolatum  Assessment/Plan: Principal Problem:   GI bleed Active Problems:   Pacemaker implanted 08/28/12   Essential hypertension, benign   Diarrhea - improved   Adenocarcinoma of the transverse colon, resected   Gastric bleed - patient with decreasing hemoglobin.  Hemoccult ostomy and transfuse 2 units   Ileus, postoperative - resolving/resolved  Plan:  1. Ileus  resolved clinically. Full liquid diet. Advance diet as per surgery  2. Continue mobilization  3. Continue to wean TPN today  4. Continue PT/OT. Severe deconditioning. Please assist with meals.  5. Disposition: Would like patient to transition to  skilled nursing facility in several days  6. Patient with anxiety and depressed mood. Continue citalopram 10 milligrams daily 7. Coronary artery disease - asymptomatic 8. Permanent pacemaker 9. Disposition - SNF placement probably on this coming Monday, December 8 10. Severe anemia - will Hemoccult ostomy and discontinue heparin and use PAS hose and transfuse 2 units packed red cells.  Patient is severely weak and  will not tolerate this degree of anemia for progressing with physical therapy etc.   LOS: 24 days   Pearla Dubonnet, MD 12/29/2012, 6:45 AM

## 2012-12-30 LAB — CBC WITH DIFFERENTIAL/PLATELET
Basophils Absolute: 0 10*3/uL (ref 0.0–0.1)
Basophils Relative: 0 % (ref 0–1)
Eosinophils Absolute: 0.2 10*3/uL (ref 0.0–0.7)
Eosinophils Relative: 3 % (ref 0–5)
HCT: 32.9 % — ABNORMAL LOW (ref 36.0–46.0)
MCH: 30.6 pg (ref 26.0–34.0)
MCHC: 35.6 g/dL (ref 30.0–36.0)
MCV: 86.1 fL (ref 78.0–100.0)
Monocytes Absolute: 1 10*3/uL (ref 0.1–1.0)
Neutro Abs: 5.1 10*3/uL (ref 1.7–7.7)
RBC: 3.82 MIL/uL — ABNORMAL LOW (ref 3.87–5.11)
RDW: 16.5 % — ABNORMAL HIGH (ref 11.5–15.5)

## 2012-12-30 LAB — TYPE AND SCREEN
ABO/RH(D): A POS
Unit division: 0
Unit division: 0

## 2012-12-30 LAB — BASIC METABOLIC PANEL
CO2: 26 mEq/L (ref 19–32)
Calcium: 9.3 mg/dL (ref 8.4–10.5)
Creatinine, Ser: 0.47 mg/dL — ABNORMAL LOW (ref 0.50–1.10)
GFR calc Af Amer: >90 mL/min (ref >90)
GFR calc non Af Amer: >90 mL/min (ref >90)
Potassium: 4.3 mEq/L (ref 3.5–5.1)

## 2012-12-30 MED ORDER — FAT EMULSION 20 % IV EMUL
250.0000 mL | INTRAVENOUS | Status: AC
  Administered 2012-12-30: 250 mL via INTRAVENOUS
  Filled 2012-12-30: qty 250

## 2012-12-30 MED ORDER — TRACE MINERALS CR-CU-F-FE-I-MN-MO-SE-ZN IV SOLN
INTRAVENOUS | Status: AC
  Administered 2012-12-30: 18:00:00 via INTRAVENOUS
  Filled 2012-12-30: qty 1000

## 2012-12-30 NOTE — Progress Notes (Signed)
Subjective: Patient feels much better, she tolerated transfusion well. She ate better this morning. She is without any specific complaints this morning.  Objective: Vital signs in last 24 hours: Temp:  [97.3 F (36.3 C)-98.7 F (37.1 C)] 98.2 F (36.8 C) (12/06 0522) Pulse Rate:  [80-92] 92 (12/06 0522) Resp:  [16-18] 18 (12/06 0522) BP: (112-167)/(41-85) 167/85 mmHg (12/06 0522) SpO2:  [98 %-100 %] 100 % (12/06 0522) Weight change:  Last BM Date: 12/30/12  Intake/Output from previous day: 12/05 0701 - 12/06 0700 In: 3661.7 [P.O.:480; I.V.:1055; Blood:650; TPN:1476.7] Out: 3075 [Urine:1850; Stool:1225] Intake/Output this shift: Total I/O In: -  Out: 650 [Urine:650]  Resp: clear to auscultation bilaterally Cardio: regular rate and rhythm, S1, S2 normal, no murmur, click, rub or gallop GI: Ostomy functioning well, abdomen soft, no distention, positive bowel sounds Extremities: PAS hose in place, no edema  Lab Results:  Results for orders placed during the hospital encounter of 12/05/12 (from the past 24 hour(s))  CBC WITH DIFFERENTIAL     Status: Abnormal   Collection Time    12/30/12  5:10 AM      Result Value Range   WBC 7.5  4.0 - 10.5 K/uL   RBC 3.82 (*) 3.87 - 5.11 MIL/uL   Hemoglobin 11.7 (*) 12.0 - 15.0 g/dL   HCT 16.1 (*) 09.6 - 04.5 %   MCV 86.1  78.0 - 100.0 fL   MCH 30.6  26.0 - 34.0 pg   MCHC 35.6  30.0 - 36.0 g/dL   RDW 40.9 (*) 81.1 - 91.4 %   Platelets 294  150 - 400 K/uL   Neutrophils Relative % 68  43 - 77 %   Neutro Abs 5.1  1.7 - 7.7 K/uL   Lymphocytes Relative 16  12 - 46 %   Lymphs Abs 1.2  0.7 - 4.0 K/uL   Monocytes Relative 13 (*) 3 - 12 %   Monocytes Absolute 1.0  0.1 - 1.0 K/uL   Eosinophils Relative 3  0 - 5 %   Eosinophils Absolute 0.2  0.0 - 0.7 K/uL   Basophils Relative 0  0 - 1 %   Basophils Absolute 0.0  0.0 - 0.1 K/uL  BASIC METABOLIC PANEL     Status: Abnormal   Collection Time    12/30/12  5:10 AM      Result Value Range    Sodium 131 (*) 135 - 145 mEq/L   Potassium 4.3  3.5 - 5.1 mEq/L   Chloride 97  96 - 112 mEq/L   CO2 26  19 - 32 mEq/L   Glucose, Bld 115 (*) 70 - 99 mg/dL   BUN 15  6 - 23 mg/dL   Creatinine, Ser 7.82 (*) 0.50 - 1.10 mg/dL   Calcium 9.3  8.4 - 95.6 mg/dL   GFR calc non Af Amer >90  >90 mL/min   GFR calc Af Amer >90  >90 mL/min      Studies/Results: No results found.  Medications:  Prior to Admission:  Prescriptions prior to admission  Medication Sig Dispense Refill  . acetaminophen (TYLENOL) 325 MG tablet Take 2 tablets (650 mg total) by mouth every 4 (four) hours as needed.      . B Complex-C (SUPER B COMPLEX PO) Take by mouth every other day.      . Cholecalciferol (VITAMIN D) 2000 UNITS tablet Take 2,000 Units by mouth daily with breakfast.      . Cyanocobalamin (VITAMIN B-12 PO) Take by  mouth every other day.      Marland Kitchen GLUCOSAMINE-CHONDROITIN PO Take 1 capsule by mouth daily with breakfast.      . lisinopril (PRINIVIL,ZESTRIL) 10 MG tablet Take 1 tablet (10 mg total) by mouth daily.  90 tablet  3  . Multiple Vitamin (MULTIVITAMIN WITH MINERALS) TABS Take 1 tablet by mouth daily with breakfast.      . Multiple Vitamins-Minerals (ANTIOXIDANT FORMULA SG) capsule Take 1 capsule by mouth daily.      . Omega-3 Fatty Acids (OMEGA 3 PO) Take 1 capsule by mouth daily with breakfast.      . Probiotic Product (ALIGN PO) Take 1 capsule by mouth daily with breakfast.      . metroNIDAZOLE (FLAGYL) 500 MG tablet Take 1 tablet (500 mg total) by mouth every 12 (twelve) hours.  8 tablet  0   Scheduled: . acetaminophen  650 mg Oral Q6H  . citalopram  10 mg Oral Daily  . diphenoxylate-atropine  1 tablet Oral Daily  . feeding supplement (ENSURE COMPLETE)  237 mL Oral TID BM  . pantoprazole (PROTONIX) IV  40 mg Intravenous Q12H   Continuous: . sodium chloride 20 mL/hr at 12/29/12 2239  . Marland KitchenTPN (CLINIMIX-E) Adult 40 mL/hr at 12/29/12 1750   And  . fat emulsion 250 mL (12/29/12 1751)    RKY:[id number], HYDROmorphone (DILAUDID) injection, LORazepam, menthol-cetylpyridinium, phenol, promethazine, simethicone, sodium chloride, traMADol, white petrolatum  Assessment/Plan: Adenocarcinoma of the transverse colon, status post resection, course complicated by postop ileus which is now resolved. Patient tolerating  by mouth intake Anemia status post transfusion, patient feels much better Status post pacemaker Deconditioning plans to skilled nursing facility.  LOS: 25 days   Karina Lenderman D 12/30/2012, 11:15 AM

## 2012-12-30 NOTE — Progress Notes (Signed)
8 Days Post-Op  Subjective: Feeling better today.  Ate a decent breakfast but still not loving the food.  Pain controlled  Objective: Vital signs in last 24 hours: Temp:  [97.3 F (36.3 C)-98.7 F (37.1 C)] 98.2 F (36.8 C) (12/06 0522) Pulse Rate:  [80-92] 92 (12/06 0522) Resp:  [16-18] 18 (12/06 0522) BP: (112-167)/(41-85) 167/85 mmHg (12/06 0522) SpO2:  [98 %-100 %] 100 % (12/06 0522) Last BM Date: 12/30/12  Intake/Output from previous day: 12/05 0701 - 12/06 0700 In: 3661.7 [P.O.:480; I.V.:1055; Blood:650; TPN:1476.7] Out: 3075 [Urine:1850; Stool:1225] Intake/Output this shift: Total I/O In: -  Out: 350 [Urine:350]  General appearance: alert, cooperative and no distress GI: soft, mild tenderness, ND, ostomy looks good with normal appearing output  Lab Results:   Recent Labs  12/29/12 0540 12/30/12 0510  WBC 7.9 7.5  HGB 7.7* 11.7*  HCT 22.6* 32.9*  PLT 321 294   BMET  Recent Labs  12/28/12 0500 12/30/12 0510  NA 132* 131*  K 4.2 4.3  CL 99 97  CO2 26 26  GLUCOSE 113* 115*  BUN 14 15  CREATININE 0.46* 0.47*  CALCIUM 9.0 9.3   PT/INR No results found for this basename: LABPROT, INR,  in the last 72 hours ABG No results found for this basename: PHART, PCO2, PO2, HCO3,  in the last 72 hours  Studies/Results: No results found.  Anti-infectives: Anti-infectives   Start     Dose/Rate Route Frequency Ordered Stop   12/23/12 0600  ertapenem (INVANZ) 1 g in sodium chloride 0.9 % 50 mL IVPB     1 g 100 mL/hr over 30 Minutes Intravenous On call to O.R. 12/22/12 2037 12/23/12 0903   12/22/12 1615  cefOXitin (MEFOXIN) 2 g in dextrose 5 % 50 mL IVPB  Status:  Discontinued     2 g 100 mL/hr over 30 Minutes Intravenous  Once 12/22/12 1601 12/22/12 2032   12/07/12 0600  cefoTEtan (CEFOTAN) 2 g in dextrose 5 % 50 mL IVPB     2 g 100 mL/hr over 30 Minutes Intravenous On call to O.R. 12/06/12 1420 12/07/12 1219   12/05/12 0600  metroNIDAZOLE (FLAGYL) IVPB 500  mg  Status:  Discontinued     500 mg 100 mL/hr over 60 Minutes Intravenous Every 8 hours 12/05/12 0525 12/07/12 0528      Assessment/Plan: s/p Procedure(s): EXPLORATORY LAPAROTOMY , RIGHT Colectomy IILEOSTOMY (N/A) Continue to wean TPN as appetite improves.  ostomy output down today.  HGB up appropriately.  Seems to be improving.  Dispo planning  LOS: 25 days    Lodema Pilot DAVID 12/30/2012

## 2012-12-30 NOTE — Progress Notes (Signed)
PARENTERAL NUTRITION CONSULT NOTE - Follow Up  Pharmacy Consult:  TPN Indication: Prolonged post-op ileus  Allergies  Allergen Reactions  . Codeine Nausea And Vomiting  . Zofran [Ondansetron Hcl]    Patient Measurements: Height: 5\' 5"  (165.1 cm) Weight: 134 lb 0.6 oz (60.8 kg) IBW/kg (Calculated) : 57  Vital Signs: Temp: 98.2 F (36.8 C) (12/06 0522) Temp src: Oral (12/06 0522) BP: 167/85 mmHg (12/06 0522) Pulse Rate: 92 (12/06 0522)  Labs:  Recent Labs  12/29/12 0540 12/30/12 0510  WBC 7.9 7.5  HGB 7.7* 11.7*  HCT 22.6* 32.9*  PLT 321 294    Recent Labs  12/28/12 0500 12/30/12 0510  NA 132* 131*  K 4.2 4.3  CL 99 97  CO2 26 26  GLUCOSE 113* 115*  BUN 14 15  CREATININE 0.46* 0.47*  CALCIUM 9.0 9.3  MG 2.0  --   PHOS 3.3  --   PROT 5.4*  --   ALBUMIN 2.1*  --   AST 46*  --   ALT 48*  --   ALKPHOS 138*  --   BILITOT 0.2*  --    Estimated Creatinine Clearance: 49.6 ml/min (by C-G formula based on Cr of 0.47).  No results found for this basename: GLUCAP,  in the last 72 hours  Insulin Requirements in the past 24 hours:  None - SSI d/c'ed  Current Nutrition:  Clinimix E 5/15 at 40 ml/hr + IVFE at 10 ml/hr Dysphagia III diet  Estimated Nutritional Needs:  1700 - 1900 kCal; 80-95 g protein daily  Assessment:  43 YOF s/p lap-assisted transverse colectomy on 12/07/12 for partially obstruction transverse colon mass. TPN started for for post-op ileus. Pt back to OR 11/28 for exp lap/R colectomy/end ileostomy for functional obstruction of anastomosis.  GI: Intermittent nausea and severe abd pain/discomfort, s/p decompression for cecal dilatation 11/21. Noted back to OR 11/28 for exp lap/R colectomy/end ileostomy for functional obstruction of anastomosis. TG WNL, prealbumin decreased to 11.7. Dysphagia 3 diet ordered. MD notes eating ~50% of meals .Plan to cont to wean TPN. Noted increased ostomy output, improved with lomotil.  Endo: no hx DM - SSI d/c'ed  11/22. BMET glucose 113.  Lytes:  Hyponatremia improved - Na 131,  K+ 4.3 (goal >4 for ileus), Phos 3.3, Mag 2  Renal: SCr 0.47. CrCl 49.  Pulm: RA 98%  Cards: hx HTN. 167/85, HR 92 .(lisinopril PTA). PRN hydralazine for SBP > 180  Hepatobil: LFTs slightly elevated AST/ALT 46/48.  TG 75, Alb 2.1, prealb 11.7  Heme/Onc: hx breast cancer. New dx colon cancer  Hgb 7.7 today  Neuro: A&O - scheduled APAP  ID: [email or web address]. WBC 7.5  Best Practices: SQ heparin dced, SCDs  TPN Access: PICC placed 12/11/12  TPN day#: 16 (11/18 >> )    Plan:  - Dec Clinimix E 5/15 to 30 ml/hr, cont IVFE at 5 ml/hr - Hope to dc TPN soon. - F/u advancement of diet  Talbert Cage, PharmD Clinical Staff Pharmacist Pager [phone]  12/30/2012 12:53 PM

## 2012-12-31 NOTE — Progress Notes (Signed)
Clinical Social Worker (CSW) met with patient and patient's husband to get their skilled nursing facility choice. Husband and patient reported they wanted [facility] however [facility] did not extend a bed offer. CSW explained that [facility] is not an option. Husband verbalized his understanding and reported that [facility]. Weekday CSW will follow up.   Jetta Lout, LCSWA Weekend CSW [phone]

## 2012-12-31 NOTE — Progress Notes (Signed)
9 Days Post-Op  Subjective: Tolerating diet.  Sitting up in bed eating solid breakfast this morning  Objective: Vital signs in last 24 hours: Temp:  [97.5 F (36.4 C)-98.4 F (36.9 C)] 97.7 F (36.5 C) (12/07 0522) Pulse Rate:  [77-93] 77 (12/07 0522) Resp:  [18] 18 (12/07 0522) BP: (174-183)/(54-86) 176/54 mmHg (12/07 0522) SpO2:  [93 %-99 %] 98 % (12/07 0522) Last BM Date: 12/30/12  Intake/Output from previous day: 12/06 0701 - 12/07 0700 In: 825 [P.O.:240; I.V.:180; TPN:405] Out: 3750 [Urine:2200; Stool:1550] Intake/Output this shift:    General appearance: alert, cooperative and no distress GI: NT, ND, incision looks good, skin looks good, healthy granulation  Lab Results:   Recent Labs  12/29/12 0540 12/30/12 0510  WBC 7.9 7.5  HGB 7.7* 11.7*  HCT 22.6* 32.9*  PLT 321 294   BMET  Recent Labs  12/30/12 0510  NA 131*  K 4.3  CL 97  CO2 26  GLUCOSE 115*  BUN 15  CREATININE 0.47*  CALCIUM 9.3   PT/INR No results found for this basename: LABPROT, INR,  in the last 72 hours ABG No results found for this basename: PHART, PCO2, PO2, HCO3,  in the last 72 hours  Studies/Results: No results found.  Anti-infectives: Anti-infectives   Start     Dose/Rate Route Frequency Ordered Stop   12/23/12 0600  ertapenem (INVANZ) 1 g in sodium chloride 0.9 % 50 mL IVPB     1 g 100 mL/hr over 30 Minutes Intravenous On call to O.R. 12/22/12 2037 12/23/12 0903   12/22/12 1615  cefOXitin (MEFOXIN) 2 g in dextrose 5 % 50 mL IVPB  Status:  Discontinued     2 g 100 mL/hr over 30 Minutes Intravenous  Once 12/22/12 1601 12/22/12 2032   12/07/12 0600  cefoTEtan (CEFOTAN) 2 g in dextrose 5 % 50 mL IVPB     2 g 100 mL/hr over 30 Minutes Intravenous On call to O.R. 12/06/12 1420 12/07/12 1219   12/05/12 0600  metroNIDAZOLE (FLAGYL) IVPB 500 mg  Status:  Discontinued     500 mg 100 mL/hr over 60 Minutes Intravenous Every 8 hours 12/05/12 0525 12/07/12 0528       Assessment/Plan: s/p Procedure(s): EXPLORATORY LAPAROTOMY , RIGHT Colectomy IILEOSTOMY (N/A) Diet as tolerated.  wean TPN to off  LOS: 26 days    Tamara [name] DAVID 12/31/2012

## 2012-12-31 NOTE — Progress Notes (Signed)
Subjective: No problems overnight, patient continues to tolerate diet well. Blood pressure slightly elevated today. Prior to admission was on lisinopril.  Objective: Vital signs in last 24 hours: Temp:  [97.5 F (36.4 C)-98.4 F (36.9 C)] 97.7 F (36.5 C) (12/07 0522) Pulse Rate:  [77-93] 77 (12/07 0522) Resp:  [18] 18 (12/07 0522) BP: (174-183)/(54-86) 176/54 mmHg (12/07 0522) SpO2:  [93 %-99 %] 98 % (12/07 0522) Weight change:  Last BM Date: 12/30/12  Intake/Output from previous day: 12/06 0701 - 12/07 0700 In: 825 [P.O.:240; I.V.:180; TPN:405] Out: 3750 [Urine:2200; Stool:1550] Intake/Output this shift: Total I/O In: 360 [P.O.:360] Out: 200 [Stool:200]  General appearance: alert and cooperative Resp: clear to auscultation bilaterally Cardio: regular rate and rhythm GI: Soft, positive bowel sounds  Lab Results:  No results found for this or any previous visit (from the past 24 hour(s)).    Studies/Results: No results found.  Medications:  Prior to Admission:  Prescriptions prior to admission  Medication Sig Dispense Refill  . acetaminophen (TYLENOL) 325 MG tablet Take 2 tablets (650 mg total) by mouth every 4 (four) hours as needed.      . B Complex-C (SUPER B COMPLEX PO) Take by mouth every other day.      . Cholecalciferol (VITAMIN D) 2000 UNITS tablet Take 2,000 Units by mouth daily with breakfast.      . Cyanocobalamin (VITAMIN B-12 PO) Take by mouth every other day.      Marland Kitchen GLUCOSAMINE-CHONDROITIN PO Take 1 capsule by mouth daily with breakfast.      . lisinopril (PRINIVIL,ZESTRIL) 10 MG tablet Take 1 tablet (10 mg total) by mouth daily.  90 tablet  3  . Multiple Vitamin (MULTIVITAMIN WITH MINERALS) TABS Take 1 tablet by mouth daily with breakfast.      . Multiple Vitamins-Minerals (ANTIOXIDANT FORMULA SG) capsule Take 1 capsule by mouth daily.      . Omega-3 Fatty Acids (OMEGA 3 PO) Take 1 capsule by mouth daily with breakfast.      . Probiotic Product  (ALIGN PO) Take 1 capsule by mouth daily with breakfast.      . metroNIDAZOLE (FLAGYL) 500 MG tablet Take 1 tablet (500 mg total) by mouth every 12 (twelve) hours.  8 tablet  0   Scheduled: . acetaminophen  650 mg Oral Q6H  . citalopram  10 mg Oral Daily  . diphenoxylate-atropine  1 tablet Oral Daily  . feeding supplement (ENSURE COMPLETE)  237 mL Oral TID BM  . pantoprazole (PROTONIX) IV  40 mg Intravenous Q12H   Continuous: . sodium chloride 20 mL/hr at 12/29/12 2239  . Marland KitchenTPN (CLINIMIX-E) Adult 30 mL/hr at 12/30/12 1759   And  . fat emulsion 250 mL (12/30/12 1800)    Assessment/Plan: Adenocarcinoma of the transverse colon, status post resection, course complicated by postop ileus which is now resolved. Patient tolerating by mouth intake , TPN been weaned to off her surgery. Anemia status post transfusion, patient feels much better  Status post pacemaker Hypertension. Blood pressure up slightly today if persistent elevation meds will be resumed  Deconditioning plans to skilled nursing facility.   LOS: 26 days   Tamara [name] D 12/31/2012, 10:39 AM

## 2013-01-01 ENCOUNTER — Other Ambulatory Visit: Payer: Self-pay | Admitting: *Deleted

## 2013-01-01 DIAGNOSIS — K55059 Acute (reversible) ischemia of intestine, part and extent unspecified: Secondary | ICD-10-CM | POA: Diagnosis not present

## 2013-01-01 DIAGNOSIS — C801 Malignant (primary) neoplasm, unspecified: Secondary | ICD-10-CM | POA: Diagnosis not present

## 2013-01-01 DIAGNOSIS — R509 Fever, unspecified: Secondary | ICD-10-CM | POA: Diagnosis not present

## 2013-01-01 DIAGNOSIS — R1312 Dysphagia, oropharyngeal phase: Secondary | ICD-10-CM | POA: Diagnosis not present

## 2013-01-01 DIAGNOSIS — K560 Paralytic ileus: Secondary | ICD-10-CM | POA: Diagnosis not present

## 2013-01-01 DIAGNOSIS — E878 Other disorders of electrolyte and fluid balance, not elsewhere classified: Secondary | ICD-10-CM | POA: Diagnosis not present

## 2013-01-01 DIAGNOSIS — I2510 Atherosclerotic heart disease of native coronary artery without angina pectoris: Secondary | ICD-10-CM | POA: Diagnosis not present

## 2013-01-01 DIAGNOSIS — C189 Malignant neoplasm of colon, unspecified: Secondary | ICD-10-CM | POA: Diagnosis not present

## 2013-01-01 DIAGNOSIS — F411 Generalized anxiety disorder: Secondary | ICD-10-CM | POA: Diagnosis not present

## 2013-01-01 DIAGNOSIS — E871 Hypo-osmolality and hyponatremia: Secondary | ICD-10-CM | POA: Diagnosis not present

## 2013-01-01 DIAGNOSIS — F329 Major depressive disorder, single episode, unspecified: Secondary | ICD-10-CM | POA: Diagnosis not present

## 2013-01-01 DIAGNOSIS — C772 Secondary and unspecified malignant neoplasm of intra-abdominal lymph nodes: Secondary | ICD-10-CM | POA: Diagnosis not present

## 2013-01-01 DIAGNOSIS — K5289 Other specified noninfective gastroenteritis and colitis: Secondary | ICD-10-CM | POA: Diagnosis not present

## 2013-01-01 DIAGNOSIS — T8140XA Infection following a procedure, unspecified, initial encounter: Secondary | ICD-10-CM | POA: Diagnosis not present

## 2013-01-01 DIAGNOSIS — R197 Diarrhea, unspecified: Secondary | ICD-10-CM | POA: Diagnosis not present

## 2013-01-01 DIAGNOSIS — C184 Malignant neoplasm of transverse colon: Secondary | ICD-10-CM | POA: Diagnosis not present

## 2013-01-01 DIAGNOSIS — I10 Essential (primary) hypertension: Secondary | ICD-10-CM | POA: Diagnosis not present

## 2013-01-01 DIAGNOSIS — Z950 Presence of cardiac pacemaker: Secondary | ICD-10-CM | POA: Diagnosis not present

## 2013-01-01 DIAGNOSIS — K922 Gastrointestinal hemorrhage, unspecified: Secondary | ICD-10-CM | POA: Diagnosis not present

## 2013-01-01 DIAGNOSIS — M6281 Muscle weakness (generalized): Secondary | ICD-10-CM | POA: Diagnosis not present

## 2013-01-01 DIAGNOSIS — C50919 Malignant neoplasm of unspecified site of unspecified female breast: Secondary | ICD-10-CM | POA: Diagnosis not present

## 2013-01-01 DIAGNOSIS — D62 Acute posthemorrhagic anemia: Secondary | ICD-10-CM | POA: Diagnosis not present

## 2013-01-01 DIAGNOSIS — K5649 Other impaction of intestine: Secondary | ICD-10-CM | POA: Diagnosis not present

## 2013-01-01 DIAGNOSIS — D0590 Unspecified type of carcinoma in situ of unspecified breast: Secondary | ICD-10-CM | POA: Diagnosis not present

## 2013-01-01 DIAGNOSIS — Z853 Personal history of malignant neoplasm of breast: Secondary | ICD-10-CM | POA: Diagnosis not present

## 2013-01-01 DIAGNOSIS — Z932 Ileostomy status: Secondary | ICD-10-CM | POA: Diagnosis not present

## 2013-01-01 DIAGNOSIS — R262 Difficulty in walking, not elsewhere classified: Secondary | ICD-10-CM | POA: Diagnosis not present

## 2013-01-01 DIAGNOSIS — D72829 Elevated white blood cell count, unspecified: Secondary | ICD-10-CM | POA: Diagnosis not present

## 2013-01-01 DIAGNOSIS — D649 Anemia, unspecified: Secondary | ICD-10-CM | POA: Diagnosis not present

## 2013-01-01 MED ORDER — DIPHENOXYLATE-ATROPINE 2.5-0.025 MG PO TABS: 1.0000 | ORAL_TABLET | Freq: Two times a day (BID) | ORAL | Status: DC

## 2013-01-01 MED ORDER — LISINOPRIL 10 MG PO TABS: 5.0000 mg | ORAL_TABLET | Freq: Every day | ORAL | Status: DC

## 2013-01-01 MED ORDER — ENSURE COMPLETE PO LIQD: 237.0000 mL | Freq: Three times a day (TID) | ORAL | Status: DC

## 2013-01-01 MED ORDER — TRAMADOL HCL 50 MG PO TABS: 50.0000 mg | ORAL_TABLET | Freq: Four times a day (QID) | ORAL | Status: DC | PRN

## 2013-01-01 MED ORDER — CITALOPRAM HYDROBROMIDE 10 MG PO TABS: 10.0000 mg | ORAL_TABLET | Freq: Every day | ORAL | Status: DC

## 2013-01-01 MED ORDER — DIPHENOXYLATE-ATROPINE 2.5-0.025 MG PO TABS: 1.0000 | ORAL_TABLET | Freq: Every day | ORAL | Status: DC

## 2013-01-01 MED ORDER — SIMETHICONE 80 MG PO CHEW: 80.0000 mg | CHEWABLE_TABLET | Freq: Four times a day (QID) | ORAL | Status: DC | PRN

## 2013-01-01 NOTE — Progress Notes (Signed)
10 Days Post-Op  Subjective: Tolerating diet.  Intermittently with depressive symptoms, but right now feeling well.  Just got some pain meds and nausea medicine.  Pending d/c to SNF today.  Ileostomy output improved on lomotil, but still high ( ).  Ambulating OOB.    Objective: Vital signs in last 24 hours: Temp:  [97.7 F (36.5 C)-98.4 F (36.9 C)] 97.7 F (36.5 C) (12/08 0653) Pulse Rate:  [86-91] 86 (12/08 0653) Resp:  [19-20] 19 (12/08 0653) BP: (151-177)/(58-71) 177/70 mmHg (12/08 0653) SpO2:  [98 %-99 %] 98 % (12/08 0653) Last BM Date: 12/31/12  Intake/Output from previous day: 12/07 0701 - 12/08 0700 In: 1509.8 [P.O.:480; I.V.:160; TPN:869.8] Out: 3900 [Urine:2300; Stool:1600] Intake/Output this shift:    PE: Gen:  Alert, NAD, pleasant Abd: Soft, NT/ND, +BS, no HSM, incisions C/D/I, ileostomy with brown liquidy stools and flatus noted in bag  Lab Results:   Recent Labs  12/30/12 0510  WBC 7.5  HGB 11.7*  HCT 32.9*  PLT 294   BMET  Recent Labs  12/30/12 0510  NA 131*  K 4.3  CL 97  CO2 26  GLUCOSE 115*  BUN 15  CREATININE 0.47*  CALCIUM 9.3   PT/INR No results found for this basename: LABPROT, INR,  in the last 72 hours CMP     Component Value Date/Time   NA 131* 12/30/2012 0510   K 4.3 12/30/2012 0510   CL 97 12/30/2012 0510   CO2 26 12/30/2012 0510   GLUCOSE 115* 12/30/2012 0510   BUN 15 12/30/2012 0510   CREATININE 0.47* 12/30/2012 0510   CALCIUM 9.3 12/30/2012 0510   PROT 5.4* 12/28/2012 0500   ALBUMIN 2.1* 12/28/2012 0500   AST 46* 12/28/2012 0500   ALT 48* 12/28/2012 0500   ALKPHOS 138* 12/28/2012 0500   BILITOT 0.2* 12/28/2012 0500   GFRNONAA >90 12/30/2012 0510   GFRAA >90 12/30/2012 0510   Lipase     Component Value Date/Time   LIPASE 31 11/26/2012 1352       Studies/Results: No results found.  Anti-infectives: Anti-infectives   Start     Dose/Rate Route Frequency Ordered Stop   12/23/12 0600  ertapenem (INVANZ) 1 g in sodium  chloride 0.9 % 50 mL IVPB     1 g 100 mL/hr over 30 Minutes Intravenous On call to O.R. 12/22/12 2037 12/23/12 0903   12/22/12 1615  cefOXitin (MEFOXIN) 2 g in dextrose 5 % 50 mL IVPB  Status:  Discontinued     2 g 100 mL/hr over 30 Minutes Intravenous  Once 12/22/12 1601 12/22/12 2032   12/07/12 0600  cefoTEtan (CEFOTAN) 2 g in dextrose 5 % 50 mL IVPB     2 g 100 mL/hr over 30 Minutes Intravenous On call to O.R. 12/06/12 1420 12/07/12 1219   12/05/12 0600  metroNIDAZOLE (FLAGYL) IVPB 500 mg  Status:  Discontinued     500 mg 100 mL/hr over 60 Minutes Intravenous Every 8 hours 12/05/12 0525 12/07/12 0528       Assessment/Plan Partially obstructing colon mass found to be Invasive AdenoCA, neg lymph node involvement, S/P lap assisted transverse, splenic flexure colectomy POD #26  POD #10 s/p Ex lap, R colectomy with resection of anastomosis and long hartmann's pouch, end ileostomy  PCM on TNA   Plan: 1.  Tolerating diet well, ileus has resolved, on Lomotil for high ileostomy output 2.  Called GI to get some help managing her high ileostomy output, which may be a problem long  term with dehydration ([facility] has seen her multiple times during her hospital stay (Dr. Bosie Clos, [facility] and Dr. Madilyn Fireman) 3.  Okay with d/c today after GI gives their recommendations 4.  F/u in the office in 2-3 weeks with Dr. Dwain Sarna 5.  D/c home today per Dr. Kevan Ny 6.  Should continued WD dressing changes (BID) to abdominal skin wound until healed    LOS: 27 days    DORT, Broady Lafoy 01/01/2013, 9:37 AM Pager: [phone]

## 2013-01-01 NOTE — Clinical Social Work Note (Signed)
CSW met with pt at bedside to discuss discharge disposition. Pt stated that her first choice for SNF placement is [facility], and her second is [facility]. Pt informed CSW that she would prefer [facility] because it is closer to her and her husband's home. Pt also stated that she "required a private room" at her SNF.  CSW followed-up with [facility] regarding SNF placement for 01/01/2013. [facility] admissions coordinator to have clinical RN review pt's discharge summary and AVS and update CSW on whether or not [facility] can accept pt. Admissions coordinator stated that if pt were to discharge to their facility, [facility] does have a private room available.  CSW to continue to follow and assist with discharge planning needs.  Darlyn Chamber, LCSWA Clinical Social Worker [phone]

## 2013-01-01 NOTE — Progress Notes (Signed)
Report called to [facility] RN. 

## 2013-01-01 NOTE — Progress Notes (Signed)
Physical Therapy Treatment Patient Details Name: Tamara [name] MRN: [id number] DOB: 1929/11/19 Today's Date: 01/01/2013 Time: 1027-2536 PT Time Calculation (min): 26 min  PT Assessment / Plan / Recommendation  History of Present Illness 79 y/o WF admitted with GI bleed and s/p partial colectomy after sigmoidoscopy revealed mass consistent with malignancy.;11/28 return to surgery due to anastomotic obstruction; s/p Rt colectomy with ileostomy   PT Comments   Pt progressing with ambulation as noted; pt remains unsteady with ambulation requiring min guard assist for safety.  Pt states that she is feeling weak from being in bed. Pt planning on d/c'ing today to [facility]; pt will benefit from continued PT to maximize functional I.  Follow Up Recommendations  SNF     Does the patient have the potential to tolerate intense rehabilitation     Barriers to Discharge        Equipment Recommendations       Recommendations for Other Services    Frequency Min 2X/week   Progress towards PT Goals Progress towards PT goals: Progressing toward goals  Plan Current plan remains appropriate    Precautions / Restrictions Precautions Precautions: Fall Precaution Comments: abd surgery; ileostomy Restrictions Weight Bearing Restrictions: No   Pertinent Vitals/Pain Abdominal pain with bed mobility, pt did not rate; nursing aware.    Mobility  Bed Mobility Bed Mobility: Sit to Sidelying Left;Scooting to HOB Sit to Sidelying Left: 4: Min guard;With rail Scooting to [facility]: With rail;5: Supervision Details for Bed Mobility Assistance: Assisted with LE's when going fromsit to sidelying Transfers Transfers: Sit to Stand;Stand to Sit Sit to Stand: 4: Min guard;With upper extremity assist;From chair/3-in-1 Stand to Sit: 4: Min guard;4: Min assist;To chair/3-in-1 Details for Transfer Assistance: Min guard for safety due to unsteadiness Ambulation/Gait Ambulation/Gait Assistance: 4: Min  guard Ambulation Distance (Feet): 175 Feet Assistive device: Rolling walker Ambulation/Gait Assistance Details: cues for upright posture; standing rest breaks required due to fatigue Gait Pattern: Decreased stride length;Step-to pattern;Shuffle Gait velocity: decreased    Exercises     PT Diagnosis:    PT Problem List:   PT Treatment Interventions:     PT Goals (current goals can now be found in the care plan section) Acute Rehab PT Goals Patient Stated Goal: to get well  Visit Information  Last PT Received On: 01/01/13 Assistance Needed: +1 History of Present Illness: 79 y/o WF admitted with GI bleed and s/p partial colectomy after sigmoidoscopy revealed mass consistent with malignancy.;11/28 return to surgery due to anastomotic obstruction; s/p Rt colectomy with ileostomy    Subjective Data  Patient Stated Goal: to get well   Cognition  Cognition Arousal/Alertness: Awake/alert Behavior During Therapy: WFL for tasks assessed/performed Overall Cognitive Status: Within Functional Limits for tasks assessed    Balance     End of Session PT - End of Session Equipment Utilized During Treatment: Gait belt Activity Tolerance: Patient limited by lethargy Patient left: in bed;with call bell/phone within reach;with family/visitor present;with nursing/sitter in room Nurse Communication: Mobility status   GP     Ernestina Columbia, SPTA 01/01/2013, 1:54 PM

## 2013-01-01 NOTE — Discharge Summary (Signed)
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  Taking by mouth well/better   Deconditioning -  Secondary to prolonged hospitalization and decreased nutrition, the TPN was very effective in maintaining                                relatively adequate nutrition.   History of coronary artery disease   Discharge Physical Exam:  General Appearance: Alert, cooperative, no distress, appears stated age  Weight change:   Intake/Output Summary (Last 24 hours) at 01/01/13 0742 Last data filed at 01/01/13 0659  Gross per 24 hour  Intake 1509.83 ml  Output   3650 ml  Net -2140.17 ml   Filed Vitals:   12/31/12 0522 12/31/12 1615 12/31/12 2245 01/01/13 0653  BP: 176/54 151/58 161/71 177/70  Pulse: 77 86 91 86  Temp: 97.7 F (36.5 C) 98.4 F (36.9 C) 98.1 F (36.7 C) 97.7 F (36.5 C)  TempSrc:  Oral Oral   Resp: 18  20 19   Height:      Weight:      SpO2: 98% 98% 99% 98%   Gen: Alert, NAD, pleasant  Skin: Pale  Lungs clear to auscultation  Cardiac: Regular rate and rhythm  Abd: Soft, appropriate tenderness, ND, +BS, midline wound clean with healthy appearing tissue.  Ileostomy site healing well  Extremities: Extremities normal, atraumatic,  no cyanosis or edema, SCDs in place  Neuro: Alert and oriented, nonfocal   Discharge Condition: Improved but condition is tenuous.  Will need daily physical therapy and adequate by mouth intake  Hospital Course:   Tamara [name] is a very pleasant 79 year old female who has had a very difficult clinical course over the past month.  She was admitted in early November, last month, with descending colon colitis felt to be either infectious or ischemic.  She was treated with Cipro and Flagyl and actually improved with hydration and was discharged home.  Shortly thereafter, she started to have GI bleeding again and partial colonoscopy was performed by Dr. Charlott Rakes and a mid transverse colonic mass was discovered.  She was taken to surgery where she had resection of the mass and the anastomosis without any obvious evidence of metastatic spread.  Unfortunately, following the surgery, she developed a severe prolonged ileus requiring treatment with NG suctioning and TPN.  Ultimately she was taken back to surgery and an ileostomy was formed at the anastomotic site was resected.  Sigmoid colon was tacked to the abdominal wall with a Prolene suture. Subsequent to this surgery, her ileostomy has had relatively high output  but has improved with Lomotil.  She is now eating better daily and strength is improving but still very weak.  She will need extensive physical therapy in a skilled nursing facility and nutritional status will need to be followed carefully. Persistent postoperative anemia was corrected with transfusions and discontinuing subcutaneous heparin. Oral aspirin will need to be presumed to as an outpatient in the next week or so.  Plans for takedown of the ileostomy with re\re anastomosis of the ileum to the sigmoid colon will need to be delayed until patient is very healthy and could tolerate another surgery.  Patient will need followup with Dr. Emelia Loron in 2 weeks and with me in 4  weeks   Things to follow up in the outpatient setting: Physical deconditioning and weight loss or weight gain as well as nutritional status and hemoglobin  Consults: Treatment Team:  Shirley Friar, MD - Gastroenterology  Emelia Loron, M.D. - General Surgery  Disposition: Skilled nursing facility for strengthening and teaching of ostomy care  Discharge Orders   Future Orders Complete By Expires   Call MD for:  difficulty breathing, headache or visual disturbances  As directed    Call MD for:  persistant nausea and vomiting  As directed    Call MD for:  redness, tenderness, or signs of infection (pain, swelling, redness, odor or green/yellow discharge around incision site)  As directed    Call MD for:  severe uncontrolled pain  As directed    Call MD for:  temperature >100.4  As directed    Diet - low sodium heart healthy  As directed    Increase activity slowly  As directed        Medication List    STOP taking these medications       ciprofloxacin 500 MG tablet  Commonly known as:  CIPRO     GLUCOSAMINE-CHONDROITIN PO     metroNIDAZOLE 500 MG tablet  Commonly known as:  FLAGYL     multivitamin with minerals Tabs tablet     OMEGA 3 PO      TAKE these medications       acetaminophen 325 MG tablet  Commonly known as:  TYLENOL  Take 2 tablets (650 mg total) by mouth every 4 (four) hours as needed.     ALIGN PO  Take 1 capsule by mouth daily with breakfast.     antioxidant formula SG capsule  Take 1 capsule by mouth daily.     citalopram 10 MG tablet  Commonly known as:  CELEXA  Take 1 tablet (10 mg total) by mouth daily.     diphenoxylate-atropine 2.5-0.025 MG per tablet  Commonly known as:  LOMOTIL  Take 1 tablet by mouth daily.     feeding supplement (ENSURE COMPLETE) Liqd  Take 237 mLs by mouth 3 (three) times daily between meals.     lisinopril 10 MG tablet  Commonly known as:  PRINIVIL,ZESTRIL  Take 0.5 tablets (5 mg total) by mouth daily.      simethicone 80 MG chewable tablet  Commonly known as:  MYLICON  Chew 1 tablet (80 mg total) by mouth every 6 (six) hours as needed for flatulence (1 to 2 tablets).     SUPER B COMPLEX PO  Take by mouth every other day.     traMADol 50 MG tablet  Commonly known as:  ULTRAM  Take 1 tablet (50 mg total) by mouth every 6 (six) hours as needed for moderate pain or severe  pain.     VITAMIN B-12 PO  Take by mouth every other day.     Vitamin D 2000 UNITS tablet  Take 2,000 Units by mouth daily with breakfast.         The results of significant diagnostics from this hospitalization (including imaging, microbiology, ancillary and laboratory) are listed below for reference.    Significant Diagnostic Studies: Dg Abd 1 View  12/22/2012   CLINICAL DATA:  Cecal distention.  Prior colon surgery.  EXAM: ABDOMEN - 1 VIEW  COMPARISON:  Abdomen series 12/21/2012.  FINDINGS: Persistent cecal distention is present. Degree of cecal distention may be decreased slightly, maximum transverse diameter is now 13.5 cm. There is associated moderate small bowel distention. These findings together suggest adynamic ileus. No free air noted. NG tube noted with tip projected over stomach. Cardiac pacer leads noted. Surgical clips right upper quadrant consistent with prior cholecystectomy. Degenerative changes that osteopenia lumbar spine and both hips.  IMPRESSION: Interim slight decrease in cecal size. Maximum transverse diameter is now 13.5 cm. There is persistence signal and small bowel distention suggesting adynamic ileus. Continued follow-up exams to demonstrate clearing suggested. NG tube noted with tip projected over stomach.   Electronically Signed   By: Maisie Fus  Register   On: 12/22/2012 10:23   Ct Abdomen Pelvis W Contrast  12/12/2012   CLINICAL DATA:  Recent surgery for ileus  EXAM: CT ABDOMEN AND PELVIS WITH CONTRAST  TECHNIQUE: Multidetector CT imaging of the abdomen and pelvis was performed using the  standard protocol following bolus administration of intravenous contrast.  CONTRAST:  80mL OMNIPAQUE IOHEXOL 300 MG/ML  SOLN  COMPARISON:  Plain films 12/12/2012, CT 11/26/2012  FINDINGS: There is a small right effusion with associated passive atelectasis. No pericardial fluid.  NG tube extends to the stomach. Oral contrast was administered through the NG tube and fills the stomach. The proximal jejunum is normal caliber. Beginning in the proximal to mid ileum there dilated loops of small bowel with poor progression of the oral contrast. These bowel loops measure up to 3 cm. There is a fluid within the distal small bowel leading up to the terminal ileum / cecum which is mildly distended to 3 cm. There is a large volume of fluid in the ascending colon and cecum. There is gas distension of the transverse colon with fluid up to the surgical anastomosis in the left upper abdomen. Distal to this anastomosis, the bowel and is collapsed. Rectal contrast was administered. The contrast of flowed only to the level of the sigmoid colon.  There is no intraperitoneal free air. Small amount intraperitoneal free fluid is present. No focal hepatic lesion. No portal venous gas. Post cholecystectomy anatomy. The pancreas, spleen, adrenal glands, kidneys are normal.  No free fluid the pelvis. The bladder contains a small amount of gas presumably related to recent catheterization. The uterus demonstrates several calcified leiomyoma. No pelvic lymphadenopathy. Degenerate changes of the spine.  IMPRESSION: 1. Poor progression of the oral contrast through the small bowel. Loops of small bowel are dilated and fluid-filled. The colon is also fluid-filled up to the anastomosis. Findings are suggestive of postoperative ileus versus obstruction at the anastomosis. Recommend serial plain films to evaluate progression of the oral contrast.  Findings conveyed toRamirezon 12/12/2012  at20:20.   Electronically Signed   By: Genevive Bi M.D.    On: 12/12/2012 20:37   Dg Chest Port 1 View  12/11/2012   CLINICAL DATA:  Line placement.  EXAM: PORTABLE CHEST -  1 VIEW  COMPARISON:  11/26/2012 and CT chest 08/24/2012.  FINDINGS: Right PICC tip projects over the SVC. Nasogastric tube is followed into the stomach. Left subclavian pacemaker lead tips project over the right atrium and right ventricle.  Trachea is midline. Heart size stable. Minimal biapical pleural thickening. Subsegmental atelectasis at the lung bases, left greater than right. No pleural fluid.  IMPRESSION: 1. Satisfactory tube/line placement. 2. Subsegmental atelectasis at the lung bases, left greater than right.   Electronically Signed   By: Leanna Battles M.D.   On: 12/11/2012 11:21   Dg Abd 2 Views  12/21/2012   CLINICAL DATA:  Postop abdominal distention with diarrhea.  EXAM: ABDOMEN - 2 VIEW  COMPARISON:  Radiographs 12/19/2012 and 12/17/2012.  CT 12/12/2012.  FINDINGS: Nasogastric tube remains in the proximal stomach, only visualized on the decubitus view. Diffuse small bowel distention is again noted. There is progressive distention of the cecum, now measuring 14.4 cm transverse. Decubitus view demonstrates no free intraperitoneal air. Some gas is present within the rectum.  IMPRESSION: Progressive cecal distension status post distal colonic resection. Diffuse small bowel distension is unchanged. Based on the prior studies, the findings remain most consistent with an ileus, although stenosis at the colonic anastomosis cannot be excluded.   Electronically Signed   By: Roxy Horseman M.D.   On: 12/21/2012 13:04   Dg Abd 2 Views  12/12/2012   CLINICAL DATA:  Recent bowel resection.  Abdominal distention.  EXAM: ABDOMEN - 2 VIEW  COMPARISON:  12/09/2012.  FINDINGS: Surgical staples are noted over the abdomen and pelvis. NG tube noted projected over the stomach. Persistent distended loops of small and large bowel containing air-fluid levels are noted. Bowel distention has progressed  slightly from prior study. Cecum measures approximately 10.5 cm in diameter. No free air is identified. Lung bases clear. No acute bony abnormality. Degenerative changes lumbar spine and right hip. Vascular calcification.  IMPRESSION: Findings most consistent with progressive adynamic ileus. Follow-up abdominal series suggested. NG tube noted projected over the stomach.   Electronically Signed   By: Maisie Fus  Register   On: 12/12/2012 15:44   Dg Abd Portable 1v  12/19/2012   CLINICAL DATA:  Abdominal distention. Subsequent encounter for ileus.  EXAM: PORTABLE ABDOMEN - 1 VIEW  COMPARISON:  12/17/2012 dating back to 12/13/2012.  FINDINGS: Persistent moderate gaseous distention of multiple loops of small bowel throughout the abdomen and persistent moderate gaseous distention of the ascending and transverse colon. Maximum cecal diameter of approximately 13 cm (uncorrected for magnification), not significantly changed since yesterday. No suggestion of free air on the supine image. Interval removal of midline surgical skin staples. Nasogastric tube tip projects over the expected location of the mid body of stomach. Surgical clips in the right upper quadrant from prior cholecystectomy. Degenerative changes involving the lumbar spine and the right hip.  IMPRESSION: 1. Stable generalized ileus. Maximum cecal diameter approximately 13 cm (uncorrected for magnification, approximately 10 cm corrected). 2. No new abnormalities.   Electronically Signed   By: Hulan Saas M.D.   On: 12/19/2012 08:49   Dg Abd Portable 1v  12/17/2012   CLINICAL DATA:  Postoperative ileus and status post resection of transverse colonic carcinoma.  EXAM: PORTABLE ABDOMEN - 1 VIEW  COMPARISON:  None.  FINDINGS: A nasogastric tube shows stable positioning in the stomach. Small bowel ileus is stable to slightly improved with current maximal small bowel caliber of approximately 4 cm. On the prior film, maximal caliber is measured at  approximately 4.8 cm. No gross signs of free air or pneumatosis.  IMPRESSION: Stable to slightly improved small bowel ileus.   Electronically Signed   By: Irish Lack M.D.   On: 12/17/2012 07:59   Dg Abd Portable 1v  12/16/2012   CLINICAL DATA:  Abdominal distention.  EXAM: PORTABLE ABDOMEN - 1 VIEW  COMPARISON:  12/14/2012 barium enema.  FINDINGS: Continued gaseous distention of large and small bowel. The cecal distention has decreased since prior study. Small bowel distention is similar. NG tube remains present in the stomach. No supine evidence of free air. Prior cholecystectomy.  IMPRESSION: Stable small bowel dilatation.  Decreasing cecal distention.   Electronically Signed   By: Charlett Nose M.D.   On: 12/16/2012 20:26   Dg Abd Portable 1v  12/09/2012   CLINICAL DATA:  Severe lower abdominal pain status post surgery.  EXAM: PORTABLE ABDOMEN - 1 VIEW  COMPARISON:  CT abdomen 11/26/2012.  FINDINGS: There is gaseous distention of the small bowel and colon. . There is no pneumoperitoneum or portal venous gas. There is no pneumatosis. There is no bowel wall thickening. There are no pathologic calcifications. Surgical clips in the right upper quadrant are noted from prior cholecystectomy. There are cutaneous surgical staples along the midline.  There are severe degenerative changes of the right hip.  IMPRESSION: Gaseous distention of small bowel: Most consistent with postoperative ileus given the patient's history.   Electronically Signed   By: Elige Ko   On: 12/09/2012 09:27   Dg Abd Portable 2v  12/13/2012   CLINICAL DATA:  Colitis, followup contrast question ileus versus obstruction  EXAM: PORTABLE ABDOMEN - 2 VIEW  COMPARISON:  Radiographs and CT abdomen/pelvis of 12/12/2012  FINDINGS: Tip of nasogastric tube projects over stomach.  Skin clips project over mid abdomen.  Surgical clips right upper quadrant likely related to cholecystectomy.  Gaseous distention of cecum up to 11.4 cm diameter.   Gaseous dilatation of small bowel loops.  Findings may represent ileus or colonic obstruction.  No definite free intraperitoneal air or bowel wall thickening.  Bones appear severely demineralized with advanced osteoarthritic changes of the right hip joint.  Excreted contrast noted within urinary bladder.  IMPRESSION: Persistent dilatation of large and small bowel loops question ileus versus obstruction.  Cecum now measures 11.4 cm diameter, increased from the 9.9 cm on the previous CT.  Findings called to patient's nurse Raoul Pitch on [address] on 12/13/2012 at 1407 hr.   Electronically Signed   By: Ulyses Southward M.D.   On: 12/13/2012 14:07   Dg Abd Portable 2v  12/10/2012   CLINICAL DATA:  Postop abdominal pain.  EXAM: PORTABLE ABDOMEN - 2 VIEW  COMPARISON:  12/09/2012  FINDINGS: Mild right colon distention is stable. There is no convincing obstruction. Findings are consistent with an adynamic ileus as described on the previous day's study. Some air is seen within the rectum.  Surgical staples are unchanged.  IMPRESSION: Persistent postoperative ileus.  No convincing obstruction.   Electronically Signed   By: Amie Portland M.D.   On: 12/10/2012 11:19   Dg Colon W/water Sol Cm  12/14/2012   CLINICAL DATA:  Post-op laparoscopic assisted transverse colectomy 12/07/2012. Continued abdominal pain and distension.  EXAM: COLON WITH WATER SOLUTION CONTRAST  TECHNIQUE: Initial scout AP supine abdominal image obtained to insure adequate colon cleansing. Omnipaque was introduced into the colon in a retrograde fashion and refluxed from the rectum to the cecum. Spot images of the colon followed by  overhead radiographs were obtained.  COMPARISON:  One view abdomen 11/1912. Abdominal pelvic CT 12/12/2012.  FLUOROSCOPY TIME:  1 min and 3 seconds.  FINDINGS: The scout abdominal radiograph demonstrates a nasogastric tube is in the proximal stomach. There is persistent diffuse small bowel and proximal colonic distention. The cecum  measures approximately 13.7 cm, increased from yesterday. Abdominal skin staples and right hip osteoarthritis are noted.  The contrast filled the rectum and passed through the sigmoid colon which demonstrates extensive diverticulosis. The contrast rapidly crossed the transverse/sigmoid colon anastomosis with subsequent filling of the distended right and proximal transverse colon. No extravasation was identified. The anastomosis was difficult to evaluate given the patient's limited mobility and does appear somewhat narrowed. Overhead radiographs demonstrate some opacification of dilated distal small bowel.  IMPRESSION: 1. The transverse/sigmoid colon anastomosis is irregular, but patent. There was no obstruction to retrograde filling of the proximal colon. No extravasation identified. 2. The sigmoid colon demonstrates extensive diverticulosis. 3. Progressive distension of the proximal colon and distal small bowel.   Electronically Signed   By: Roxy Horseman M.D.   On: 12/14/2012 14:36    Microbiology: Recent Results (from the past 240 hour(s))  SURGICAL PCR SCREEN     Status: None   Collection Time    12/22/12  1:33 PM      Result Value Range Status   MRSA, PCR NEGATIVE  NEGATIVE Final   Staphylococcus aureus NEGATIVE  NEGATIVE Final   Comment:            The Xpert SA Assay (FDA     approved for NASAL specimens     in patients over 21 years of age),     is one component of     a comprehensive surveillance     program.  Test performance has     been validated by [employer] for patients greater     than or equal to 48 year old.     It is not intended     to diagnose infection nor to     guide or monitor treatment.     Labs: Results for orders placed during the hospital encounter of 12/05/12  CLOSTRIDIUM DIFFICILE BY PCR      Result Value Range   C difficile by pcr NEGATIVE  NEGATIVE  SURGICAL PCR SCREEN      Result Value Range   MRSA, PCR NEGATIVE  NEGATIVE   Staphylococcus aureus  NEGATIVE  NEGATIVE  CLOSTRIDIUM DIFFICILE BY PCR      Result Value Range   C difficile by pcr NEGATIVE  NEGATIVE  SURGICAL PCR SCREEN      Result Value Range   MRSA, PCR NEGATIVE  NEGATIVE   Staphylococcus aureus NEGATIVE  NEGATIVE  CBC WITH DIFFERENTIAL      Result Value Range   WBC 6.1  4.0 - 10.5 K/uL   RBC 3.34 (*) 3.87 - 5.11 MIL/uL   Hemoglobin 10.2 (*) 12.0 - 15.0 g/dL   HCT 16.1 (*) 09.6 - 04.5 %   MCV 90.4  78.0 - 100.0 fL   MCH 30.5  26.0 - 34.0 pg   MCHC 33.8  30.0 - 36.0 g/dL   RDW 40.9 (*) 81.1 - 91.4 %   Platelets 249  150 - 400 K/uL   Neutrophils Relative % 45  43 - 77 %   Neutro Abs 2.8  1.7 - 7.7 K/uL   Lymphocytes Relative 36  12 - 46 %  Lymphs Abs 2.2  0.7 - 4.0 K/uL   Monocytes Relative 15 (*) 3 - 12 %   Monocytes Absolute 0.9  0.1 - 1.0 K/uL   Eosinophils Relative 2  0 - 5 %   Eosinophils Absolute 0.1  0.0 - 0.7 K/uL   Basophils Relative 2 (*) 0 - 1 %   Basophils Absolute 0.1  0.0 - 0.1 K/uL  COMPREHENSIVE METABOLIC PANEL      Result Value Range   Sodium 134 (*) 135 - 145 mEq/L   Potassium 4.7  3.5 - 5.1 mEq/L   Chloride 99  96 - 112 mEq/L   CO2 25  19 - 32 mEq/L   Glucose, Bld 97  70 - 99 mg/dL   BUN 18  6 - 23 mg/dL   Creatinine, Ser 1.61  0.50 - 1.10 mg/dL   Calcium 9.9  8.4 - 09.6 mg/dL   Total Protein 7.1  6.0 - 8.3 g/dL   Albumin 3.8  3.5 - 5.2 g/dL   AST 43 (*) 0 - 37 U/L   ALT 39 (*) 0 - 35 U/L   Alkaline Phosphatase 98  39 - 117 U/L   Total Bilirubin 0.3  0.3 - 1.2 mg/dL   GFR calc non Af Amer 81 (*) >90 mL/min   GFR calc Af Amer >90  >90 mL/min  URINALYSIS, ROUTINE W REFLEX MICROSCOPIC      Result Value Range   Color, Urine YELLOW  YELLOW   APPearance CLEAR  CLEAR   Specific Gravity, Urine 1.006  1.005 - 1.030   pH 6.5  5.0 - 8.0   Glucose, UA NEGATIVE  NEGATIVE mg/dL   Hgb urine dipstick NEGATIVE  NEGATIVE   Bilirubin Urine NEGATIVE  NEGATIVE   Ketones, ur NEGATIVE  NEGATIVE mg/dL   Protein, ur NEGATIVE  NEGATIVE mg/dL    Urobilinogen, UA 0.2  0.0 - 1.0 mg/dL   Nitrite NEGATIVE  NEGATIVE   Leukocytes, UA NEGATIVE  NEGATIVE  CBC WITH DIFFERENTIAL      Result Value Range   WBC 5.3  4.0 - 10.5 K/uL   RBC 2.99 (*) 3.87 - 5.11 MIL/uL   Hemoglobin 9.1 (*) 12.0 - 15.0 g/dL   HCT 04.5 (*) 40.9 - 81.1 %   MCV 89.0  78.0 - 100.0 fL   MCH 30.4  26.0 - 34.0 pg   MCHC 34.2  30.0 - 36.0 g/dL   RDW 91.4 (*) 78.2 - 95.6 %   Platelets 213  150 - 400 K/uL   Neutrophils Relative % 46  43 - 77 %   Neutro Abs 2.4  1.7 - 7.7 K/uL   Lymphocytes Relative 38  12 - 46 %   Lymphs Abs 2.0  0.7 - 4.0 K/uL   Monocytes Relative 13 (*) 3 - 12 %   Monocytes Absolute 0.7  0.1 - 1.0 K/uL   Eosinophils Relative 2  0 - 5 %   Eosinophils Absolute 0.1  0.0 - 0.7 K/uL   Basophils Relative 1  0 - 1 %   Basophils Absolute 0.1  0.0 - 0.1 K/uL  COMPREHENSIVE METABOLIC PANEL      Result Value Range   Sodium 137  135 - 145 mEq/L   Potassium 4.2  3.5 - 5.1 mEq/L   Chloride 105  96 - 112 mEq/L   CO2 22  19 - 32 mEq/L   Glucose, Bld 89  70 - 99 mg/dL   BUN 12  6 - 23  mg/dL   Creatinine, Ser 4.54  0.50 - 1.10 mg/dL   Calcium 9.1  8.4 - 09.8 mg/dL   Total Protein 6.1  6.0 - 8.3 g/dL   Albumin 3.2 (*) 3.5 - 5.2 g/dL   AST 35  0 - 37 U/L   ALT 31  0 - 35 U/L   Alkaline Phosphatase 81  39 - 117 U/L   Total Bilirubin 0.2 (*) 0.3 - 1.2 mg/dL   GFR calc non Af Amer 86 (*) >90 mL/min   GFR calc Af Amer >90  >90 mL/min  CBC      Result Value Range   WBC 4.1  4.0 - 10.5 K/uL   RBC 2.91 (*) 3.87 - 5.11 MIL/uL   Hemoglobin 8.9 (*) 12.0 - 15.0 g/dL   HCT 11.9 (*) 14.7 - 82.9 %   MCV 90.4  78.0 - 100.0 fL   MCH 30.6  26.0 - 34.0 pg   MCHC 33.8  30.0 - 36.0 g/dL   RDW 56.2 (*) 13.0 - 86.5 %   Platelets 217  150 - 400 K/uL  BASIC METABOLIC PANEL      Result Value Range   Sodium 137  135 - 145 mEq/L   Potassium 3.9  3.5 - 5.1 mEq/L   Chloride 106  96 - 112 mEq/L   CO2 21  19 - 32 mEq/L   Glucose, Bld 88  70 - 99 mg/dL   BUN 7  6 - 23 mg/dL    Creatinine, Ser 7.84  0.50 - 1.10 mg/dL   Calcium 8.9  8.4 - 69.6 mg/dL   GFR calc non Af Amer 86 (*) >90 mL/min   GFR calc Af Amer >90  >90 mL/min  CBC      Result Value Range   WBC 6.8  4.0 - 10.5 K/uL   RBC 2.93 (*) 3.87 - 5.11 MIL/uL   Hemoglobin 8.9 (*) 12.0 - 15.0 g/dL   HCT 29.5 (*) 28.4 - 13.2 %   MCV 90.4  78.0 - 100.0 fL   MCH 30.4  26.0 - 34.0 pg   MCHC 33.6  30.0 - 36.0 g/dL   RDW 44.0 (*) 10.2 - 72.5 %   Platelets 241  150 - 400 K/uL  CEA      Result Value Range   CEA 1.3  0.0 - 5.0 ng/mL  CBC WITH DIFFERENTIAL      Result Value Range   WBC 9.9  4.0 - 10.5 K/uL   RBC 3.10 (*) 3.87 - 5.11 MIL/uL   Hemoglobin 9.5 (*) 12.0 - 15.0 g/dL   HCT 36.6 (*) 44.0 - 34.7 %   MCV 89.0  78.0 - 100.0 fL   MCH 30.6  26.0 - 34.0 pg   MCHC 34.4  30.0 - 36.0 g/dL   RDW 42.5 (*) 95.6 - 38.7 %   Platelets 252  150 - 400 K/uL   Neutrophils Relative % 79 (*) 43 - 77 %   Neutro Abs 7.8 (*) 1.7 - 7.7 K/uL   Lymphocytes Relative 11 (*) 12 - 46 %   Lymphs Abs 1.1  0.7 - 4.0 K/uL   Monocytes Relative 9  3 - 12 %   Monocytes Absolute 0.9  0.1 - 1.0 K/uL   Eosinophils Relative 1  0 - 5 %   Eosinophils Absolute 0.1  0.0 - 0.7 K/uL   Basophils Relative 0  0 - 1 %   Basophils Absolute 0.0  0.0 - 0.1  K/uL  BASIC METABOLIC PANEL      Result Value Range   Sodium 135  135 - 145 mEq/L   Potassium 3.2 (*) 3.5 - 5.1 mEq/L   Chloride 100  96 - 112 mEq/L   CO2 25  19 - 32 mEq/L   Glucose, Bld 134 (*) 70 - 99 mg/dL   BUN <3 (*) 6 - 23 mg/dL   Creatinine, Ser 0.98 (*) 0.50 - 1.10 mg/dL   Calcium 9.5  8.4 - 11.9 mg/dL   GFR calc non Af Amer >90  >90 mL/min   GFR calc Af Amer >90  >90 mL/min  CBC WITH DIFFERENTIAL      Result Value Range   WBC 11.3 (*) 4.0 - 10.5 K/uL   RBC 3.24 (*) 3.87 - 5.11 MIL/uL   Hemoglobin 10.0 (*) 12.0 - 15.0 g/dL   HCT 14.7 (*) 82.9 - 56.2 %   MCV 88.3  78.0 - 100.0 fL   MCH 30.9  26.0 - 34.0 pg   MCHC 35.0  30.0 - 36.0 g/dL   RDW 13.0 (*) 86.5 - 78.4 %    Platelets 305  150 - 400 K/uL   Neutrophils Relative % 86 (*) 43 - 77 %   Neutro Abs 9.7 (*) 1.7 - 7.7 K/uL   Lymphocytes Relative 8 (*) 12 - 46 %   Lymphs Abs 0.8  0.7 - 4.0 K/uL   Monocytes Relative 6  3 - 12 %   Monocytes Absolute 0.7  0.1 - 1.0 K/uL   Eosinophils Relative 0  0 - 5 %   Eosinophils Absolute 0.0  0.0 - 0.7 K/uL   Basophils Relative 0  0 - 1 %   Basophils Absolute 0.0  0.0 - 0.1 K/uL  BASIC METABOLIC PANEL      Result Value Range   Sodium 128 (*) 135 - 145 mEq/L   Potassium 3.7  3.5 - 5.1 mEq/L   Chloride 95 (*) 96 - 112 mEq/L   CO2 23  19 - 32 mEq/L   Glucose, Bld 151 (*) 70 - 99 mg/dL   BUN 4 (*) 6 - 23 mg/dL   Creatinine, Ser 6.96 (*) 0.50 - 1.10 mg/dL   Calcium 9.4  8.4 - 29.5 mg/dL   GFR calc non Af Amer >90  >90 mL/min   GFR calc Af Amer >90  >90 mL/min  CBC WITH DIFFERENTIAL      Result Value Range   WBC 12.1 (*) 4.0 - 10.5 K/uL   RBC 3.48 (*) 3.87 - 5.11 MIL/uL   Hemoglobin 10.7 (*) 12.0 - 15.0 g/dL   HCT 28.4 (*) 13.2 - 44.0 %   MCV 86.8  78.0 - 100.0 fL   MCH 30.7  26.0 - 34.0 pg   MCHC 35.4  30.0 - 36.0 g/dL   RDW 10.2 (*) 72.5 - 36.6 %   Platelets 351  150 - 400 K/uL   Neutrophils Relative % 84 (*) 43 - 77 %   Neutro Abs 10.2 (*) 1.7 - 7.7 K/uL   Lymphocytes Relative 8 (*) 12 - 46 %   Lymphs Abs 1.0  0.7 - 4.0 K/uL   Monocytes Relative 8  3 - 12 %   Monocytes Absolute 0.9  0.1 - 1.0 K/uL   Eosinophils Relative 0  0 - 5 %   Eosinophils Absolute 0.0  0.0 - 0.7 K/uL   Basophils Relative 0  0 - 1 %   Basophils Absolute 0.0  0.0 - 0.1 K/uL  COMPREHENSIVE METABOLIC PANEL      Result Value Range   Sodium 132 (*) 135 - 145 mEq/L   Potassium 4.1  3.5 - 5.1 mEq/L   Chloride 98  96 - 112 mEq/L   CO2 26  19 - 32 mEq/L   Glucose, Bld 129 (*) 70 - 99 mg/dL   BUN 12  6 - 23 mg/dL   Creatinine, Ser 1.61  0.50 - 1.10 mg/dL   Calcium 9.7  8.4 - 09.6 mg/dL   Total Protein 6.3  6.0 - 8.3 g/dL   Albumin 3.0 (*) 3.5 - 5.2 g/dL   AST 18  0 - 37 U/L   ALT  17  0 - 35 U/L   Alkaline Phosphatase 77  39 - 117 U/L   Total Bilirubin 0.4  0.3 - 1.2 mg/dL   GFR calc non Af Amer 84 (*) >90 mL/min   GFR calc Af Amer >90  >90 mL/min  OCCULT BLOOD GASTRIC / DUODENUM (SPECIMEN CUP)      Result Value Range   pH, Gastric NOT DONE     Occult Blood, Gastric POSITIVE (*) NEGATIVE  HEMOGLOBIN AND HEMATOCRIT, BLOOD      Result Value Range   Hemoglobin 9.9 (*) 12.0 - 15.0 g/dL   HCT 04.5 (*) 40.9 - 81.1 %  CBC WITH DIFFERENTIAL      Result Value Range   WBC 10.2  4.0 - 10.5 K/uL   RBC 3.19 (*) 3.87 - 5.11 MIL/uL   Hemoglobin 9.5 (*) 12.0 - 15.0 g/dL   HCT 91.4 (*) 78.2 - 95.6 %   MCV 88.4  78.0 - 100.0 fL   MCH 29.8  26.0 - 34.0 pg   MCHC 33.7  30.0 - 36.0 g/dL   RDW 21.3 (*) 08.6 - 57.8 %   Platelets 302  150 - 400 K/uL   Neutrophils Relative % 72  43 - 77 %   Neutro Abs 7.4  1.7 - 7.7 K/uL   Lymphocytes Relative 15  12 - 46 %   Lymphs Abs 1.6  0.7 - 4.0 K/uL   Monocytes Relative 12  3 - 12 %   Monocytes Absolute 1.2 (*) 0.1 - 1.0 K/uL   Eosinophils Relative 1  0 - 5 %   Eosinophils Absolute 0.1  0.0 - 0.7 K/uL   Basophils Relative 0  0 - 1 %   Basophils Absolute 0.0  0.0 - 0.1 K/uL  BASIC METABOLIC PANEL      Result Value Range   Sodium 133 (*) 135 - 145 mEq/L   Potassium 3.9  3.5 - 5.1 mEq/L   Chloride 100  96 - 112 mEq/L   CO2 26  19 - 32 mEq/L   Glucose, Bld 116 (*) 70 - 99 mg/dL   BUN 14  6 - 23 mg/dL   Creatinine, Ser 4.69  0.50 - 1.10 mg/dL   Calcium 9.4  8.4 - 62.9 mg/dL   GFR calc non Af Amer 88 (*) >90 mL/min   GFR calc Af Amer >90  >90 mL/min  MAGNESIUM      Result Value Range   Magnesium 2.0  1.5 - 2.5 mg/dL  PHOSPHORUS      Result Value Range   Phosphorus 2.6  2.3 - 4.6 mg/dL  COMPREHENSIVE METABOLIC PANEL      Result Value Range   Sodium 132 (*) 135 - 145 mEq/L   Potassium 3.5  3.5 - 5.1  mEq/L   Chloride 98  96 - 112 mEq/L   CO2 25  19 - 32 mEq/L   Glucose, Bld 131 (*) 70 - 99 mg/dL   BUN 13  6 - 23 mg/dL    Creatinine, Ser 1.61 (*) 0.50 - 1.10 mg/dL   Calcium 9.2  8.4 - 09.6 mg/dL   Total Protein 6.0  6.0 - 8.3 g/dL   Albumin 2.9 (*) 3.5 - 5.2 g/dL   AST 63 (*) 0 - 37 U/L   ALT 40 (*) 0 - 35 U/L   Alkaline Phosphatase 80  39 - 117 U/L   Total Bilirubin 0.4  0.3 - 1.2 mg/dL   GFR calc non Af Amer >90  >90 mL/min   GFR calc Af Amer >90  >90 mL/min  PREALBUMIN      Result Value Range   Prealbumin 17.5 (*) 17.0 - 34.0 mg/dL  MAGNESIUM      Result Value Range   Magnesium 2.0  1.5 - 2.5 mg/dL  PHOSPHORUS      Result Value Range   Phosphorus 2.4  2.3 - 4.6 mg/dL  CBC      Result Value Range   WBC 8.9  4.0 - 10.5 K/uL   RBC 3.12 (*) 3.87 - 5.11 MIL/uL   Hemoglobin 9.6 (*) 12.0 - 15.0 g/dL   HCT 04.5 (*) 40.9 - 81.1 %   MCV 88.5  78.0 - 100.0 fL   MCH 30.8  26.0 - 34.0 pg   MCHC 34.8  30.0 - 36.0 g/dL   RDW 91.4 (*) 78.2 - 95.6 %   Platelets 277  150 - 400 K/uL  DIFFERENTIAL      Result Value Range   Neutrophils Relative % 73  43 - 77 %   Neutro Abs 6.5  1.7 - 7.7 K/uL   Lymphocytes Relative 14  12 - 46 %   Lymphs Abs 1.2  0.7 - 4.0 K/uL   Monocytes Relative 12  3 - 12 %   Monocytes Absolute 1.1 (*) 0.1 - 1.0 K/uL   Eosinophils Relative 1  0 - 5 %   Eosinophils Absolute 0.1  0.0 - 0.7 K/uL   Basophils Relative 0  0 - 1 %   Basophils Absolute 0.0  0.0 - 0.1 K/uL  TRIGLYCERIDES      Result Value Range   Triglycerides 101  <150 mg/dL  GLUCOSE, CAPILLARY      Result Value Range   Glucose-Capillary 135 (*) 70 - 99 mg/dL   Comment 1 Notify RN     Comment 2 Documented in Chart    GLUCOSE, CAPILLARY      Result Value Range   Glucose-Capillary 128 (*) 70 - 99 mg/dL   Comment 1 Notify RN     Comment 2 Documented in Chart    GLUCOSE, CAPILLARY      Result Value Range   Glucose-Capillary 131 (*) 70 - 99 mg/dL  COMPREHENSIVE METABOLIC PANEL      Result Value Range   Sodium 129 (*) 135 - 145 mEq/L   Potassium 3.5  3.5 - 5.1 mEq/L   Chloride 97  96 - 112 mEq/L   CO2 23  19 - 32  mEq/L   Glucose, Bld 140 (*) 70 - 99 mg/dL   BUN 17  6 - 23 mg/dL   Creatinine, Ser 2.13 (*) 0.50 - 1.10 mg/dL   Calcium 8.8  8.4 - 08.6 mg/dL   Total Protein 5.8 (*) 6.0 -  8.3 g/dL   Albumin 2.8 (*) 3.5 - 5.2 g/dL   AST 41 (*) 0 - 37 U/L   ALT 36 (*) 0 - 35 U/L   Alkaline Phosphatase 79  39 - 117 U/L   Total Bilirubin 0.3  0.3 - 1.2 mg/dL   GFR calc non Af Amer >90  >90 mL/min   GFR calc Af Amer >90  >90 mL/min  MAGNESIUM      Result Value Range   Magnesium 2.0  1.5 - 2.5 mg/dL  PHOSPHORUS      Result Value Range   Phosphorus 2.6  2.3 - 4.6 mg/dL  GLUCOSE, CAPILLARY      Result Value Range   Glucose-Capillary 130 (*) 70 - 99 mg/dL  GLUCOSE, CAPILLARY      Result Value Range   Glucose-Capillary 137 (*) 70 - 99 mg/dL   Comment 1 Notify RN     Comment 2 Documented in Chart    GLUCOSE, CAPILLARY      Result Value Range   Glucose-Capillary 138 (*) 70 - 99 mg/dL  CBC WITH DIFFERENTIAL      Result Value Range   WBC 9.0  4.0 - 10.5 K/uL   RBC 3.17 (*) 3.87 - 5.11 MIL/uL   Hemoglobin 9.8 (*) 12.0 - 15.0 g/dL   HCT 40.9 (*) 81.1 - 91.4 %   MCV 87.7  78.0 - 100.0 fL   MCH 30.9  26.0 - 34.0 pg   MCHC 35.3  30.0 - 36.0 g/dL   RDW 78.2 (*) 95.6 - 21.3 %   Platelets 271  150 - 400 K/uL   Neutrophils Relative % 74  43 - 77 %   Neutro Abs 6.7  1.7 - 7.7 K/uL   Lymphocytes Relative 14  12 - 46 %   Lymphs Abs 1.2  0.7 - 4.0 K/uL   Monocytes Relative 11  3 - 12 %   Monocytes Absolute 1.0  0.1 - 1.0 K/uL   Eosinophils Relative 1  0 - 5 %   Eosinophils Absolute 0.1  0.0 - 0.7 K/uL   Basophils Relative 0  0 - 1 %   Basophils Absolute 0.0  0.0 - 0.1 K/uL  GLUCOSE, CAPILLARY      Result Value Range   Glucose-Capillary 132 (*) 70 - 99 mg/dL  CBC WITH DIFFERENTIAL      Result Value Range   WBC 7.4  4.0 - 10.5 K/uL   RBC 2.95 (*) 3.87 - 5.11 MIL/uL   Hemoglobin 9.2 (*) 12.0 - 15.0 g/dL   HCT 08.6 (*) 57.8 - 46.9 %   MCV 88.5  78.0 - 100.0 fL   MCH 31.2  26.0 - 34.0 pg   MCHC 35.2   30.0 - 36.0 g/dL   RDW 62.9 (*) 52.8 - 41.3 %   Platelets 267  150 - 400 K/uL   Neutrophils Relative % 69  43 - 77 %   Neutro Abs 5.1  1.7 - 7.7 K/uL   Lymphocytes Relative 15  12 - 46 %   Lymphs Abs 1.1  0.7 - 4.0 K/uL   Monocytes Relative 14 (*) 3 - 12 %   Monocytes Absolute 1.0  0.1 - 1.0 K/uL   Eosinophils Relative 2  0 - 5 %   Eosinophils Absolute 0.1  0.0 - 0.7 K/uL   Basophils Relative 0  0 - 1 %   Basophils Absolute 0.0  0.0 - 0.1 K/uL  BASIC METABOLIC PANEL  Result Value Range   Sodium 130 (*) 135 - 145 mEq/L   Potassium 4.1  3.5 - 5.1 mEq/L   Chloride 98  96 - 112 mEq/L   CO2 25  19 - 32 mEq/L   Glucose, Bld 130 (*) 70 - 99 mg/dL   BUN 22  6 - 23 mg/dL   Creatinine, Ser 9.14 (*) 0.50 - 1.10 mg/dL   Calcium 8.9  8.4 - 78.2 mg/dL   GFR calc non Af Amer >90  >90 mL/min   GFR calc Af Amer >90  >90 mL/min  GLUCOSE, CAPILLARY      Result Value Range   Glucose-Capillary 132 (*) 70 - 99 mg/dL  GLUCOSE, CAPILLARY      Result Value Range   Glucose-Capillary 129 (*) 70 - 99 mg/dL   Comment 1 Notify RN     Comment 2 Documented in Chart    GLUCOSE, CAPILLARY      Result Value Range   Glucose-Capillary 122 (*) 70 - 99 mg/dL   Comment 1 Notify RN     Comment 2 Documented in Chart    GLUCOSE, CAPILLARY      Result Value Range   Glucose-Capillary 139 (*) 70 - 99 mg/dL  CBC WITH DIFFERENTIAL      Result Value Range   WBC 8.6  4.0 - 10.5 K/uL   RBC 3.00 (*) 3.87 - 5.11 MIL/uL   Hemoglobin 9.3 (*) 12.0 - 15.0 g/dL   HCT 95.6 (*) 21.3 - 08.6 %   MCV 89.7  78.0 - 100.0 fL   MCH 31.0  26.0 - 34.0 pg   MCHC 34.6  30.0 - 36.0 g/dL   RDW 57.8 (*) 46.9 - 62.9 %   Platelets 295  150 - 400 K/uL   Neutrophils Relative % 70  43 - 77 %   Neutro Abs 6.0  1.7 - 7.7 K/uL   Lymphocytes Relative 15  12 - 46 %   Lymphs Abs 1.3  0.7 - 4.0 K/uL   Monocytes Relative 13 (*) 3 - 12 %   Monocytes Absolute 1.1 (*) 0.1 - 1.0 K/uL   Eosinophils Relative 2  0 - 5 %   Eosinophils Absolute 0.2   0.0 - 0.7 K/uL   Basophils Relative 0  0 - 1 %   Basophils Absolute 0.0  0.0 - 0.1 K/uL  BASIC METABOLIC PANEL      Result Value Range   Sodium 126 (*) 135 - 145 mEq/L   Potassium 4.2  3.5 - 5.1 mEq/L   Chloride 96  96 - 112 mEq/L   CO2 22  19 - 32 mEq/L   Glucose, Bld 125 (*) 70 - 99 mg/dL   BUN 21  6 - 23 mg/dL   Creatinine, Ser 5.28 (*) 0.50 - 1.10 mg/dL   Calcium 8.7  8.4 - 41.3 mg/dL   GFR calc non Af Amer 89 (*) >90 mL/min   GFR calc Af Amer >90  >90 mL/min  GLUCOSE, CAPILLARY      Result Value Range   Glucose-Capillary 119 (*) 70 - 99 mg/dL   Comment 1 Notify RN    GLUCOSE, CAPILLARY      Result Value Range   Glucose-Capillary 128 (*) 70 - 99 mg/dL  GLUCOSE, CAPILLARY      Result Value Range   Glucose-Capillary 127 (*) 70 - 99 mg/dL   Comment 1 Notify RN     Comment 2 Documented in Chart  GLUCOSE, CAPILLARY      Result Value Range   Glucose-Capillary 122 (*) 70 - 99 mg/dL  BASIC METABOLIC PANEL      Result Value Range   Sodium 128 (*) 135 - 145 mEq/L   Potassium 3.9  3.5 - 5.1 mEq/L   Chloride 96  96 - 112 mEq/L   CO2 24  19 - 32 mEq/L   Glucose, Bld 110 (*) 70 - 99 mg/dL   BUN 18  6 - 23 mg/dL   Creatinine, Ser 1.61 (*) 0.50 - 1.10 mg/dL   Calcium 8.8  8.4 - 09.6 mg/dL   GFR calc non Af Amer >90  >90 mL/min   GFR calc Af Amer >90  >90 mL/min  COMPREHENSIVE METABOLIC PANEL      Result Value Range   Sodium 126 (*) 135 - 145 mEq/L   Potassium 4.2  3.5 - 5.1 mEq/L   Chloride 95 (*) 96 - 112 mEq/L   CO2 23  19 - 32 mEq/L   Glucose, Bld 115 (*) 70 - 99 mg/dL   BUN 21  6 - 23 mg/dL   Creatinine, Ser 0.45  0.50 - 1.10 mg/dL   Calcium 8.7  8.4 - 40.9 mg/dL   Total Protein 5.4 (*) 6.0 - 8.3 g/dL   Albumin 2.5 (*) 3.5 - 5.2 g/dL   AST 40 (*) 0 - 37 U/L   ALT 37 (*) 0 - 35 U/L   Alkaline Phosphatase 94  39 - 117 U/L   Total Bilirubin 0.2 (*) 0.3 - 1.2 mg/dL   GFR calc non Af Amer 87 (*) >90 mL/min   GFR calc Af Amer >90  >90 mL/min  MAGNESIUM      Result  Value Range   Magnesium 2.4  1.5 - 2.5 mg/dL  PHOSPHORUS      Result Value Range   Phosphorus 3.5  2.3 - 4.6 mg/dL  CBC      Result Value Range   WBC 7.9  4.0 - 10.5 K/uL   RBC 2.80 (*) 3.87 - 5.11 MIL/uL   Hemoglobin 8.6 (*) 12.0 - 15.0 g/dL   HCT 81.1 (*) 91.4 - 78.2 %   MCV 89.6  78.0 - 100.0 fL   MCH 30.7  26.0 - 34.0 pg   MCHC 34.3  30.0 - 36.0 g/dL   RDW 95.6 (*) 21.3 - 08.6 %   Platelets 302  150 - 400 K/uL  DIFFERENTIAL      Result Value Range   Neutrophils Relative % 67  43 - 77 %   Neutro Abs 5.3  1.7 - 7.7 K/uL   Lymphocytes Relative 17  12 - 46 %   Lymphs Abs 1.3  0.7 - 4.0 K/uL   Monocytes Relative 14 (*) 3 - 12 %   Monocytes Absolute 1.1 (*) 0.1 - 1.0 K/uL   Eosinophils Relative 2  0 - 5 %   Eosinophils Absolute 0.2  0.0 - 0.7 K/uL   Basophils Relative 0  0 - 1 %   Basophils Absolute 0.0  0.0 - 0.1 K/uL  PREALBUMIN      Result Value Range   Prealbumin 17.0 (*) 17.0 - 34.0 mg/dL  TRIGLYCERIDES      Result Value Range   Triglycerides 83  <150 mg/dL  BASIC METABOLIC PANEL      Result Value Range   Sodium 131 (*) 135 - 145 mEq/L   Potassium 4.5  3.5 - 5.1 mEq/L   Chloride  99  96 - 112 mEq/L   CO2 24  19 - 32 mEq/L   Glucose, Bld 113 (*) 70 - 99 mg/dL   BUN 18  6 - 23 mg/dL   Creatinine, Ser 1.61 (*) 0.50 - 1.10 mg/dL   Calcium 9.1  8.4 - 09.6 mg/dL   GFR calc non Af Amer 90 (*) >90 mL/min   GFR calc Af Amer >90  >90 mL/min  COMPREHENSIVE METABOLIC PANEL      Result Value Range   Sodium 130 (*) 135 - 145 mEq/L   Potassium 4.4  3.5 - 5.1 mEq/L   Chloride 99  96 - 112 mEq/L   CO2 23  19 - 32 mEq/L   Glucose, Bld 110 (*) 70 - 99 mg/dL   BUN 18  6 - 23 mg/dL   Creatinine, Ser 0.45 (*) 0.50 - 1.10 mg/dL   Calcium 8.9  8.4 - 40.9 mg/dL   Total Protein 5.8 (*) 6.0 - 8.3 g/dL   Albumin 2.6 (*) 3.5 - 5.2 g/dL   AST 41 (*) 0 - 37 U/L   ALT 41 (*) 0 - 35 U/L   Alkaline Phosphatase 119 (*) 39 - 117 U/L   Total Bilirubin 0.2 (*) 0.3 - 1.2 mg/dL   GFR calc non  Af Amer >90  >90 mL/min   GFR calc Af Amer >90  >90 mL/min  MAGNESIUM      Result Value Range   Magnesium 2.1  1.5 - 2.5 mg/dL  PHOSPHORUS      Result Value Range   Phosphorus 3.2  2.3 - 4.6 mg/dL  CBC      Result Value Range   WBC 12.5 (*) 4.0 - 10.5 K/uL   RBC 3.05 (*) 3.87 - 5.11 MIL/uL   Hemoglobin 9.2 (*) 12.0 - 15.0 g/dL   HCT 81.1 (*) 91.4 - 78.2 %   MCV 89.2  78.0 - 100.0 fL   MCH 30.2  26.0 - 34.0 pg   MCHC 33.8  30.0 - 36.0 g/dL   RDW 95.6 (*) 21.3 - 08.6 %   Platelets 352  150 - 400 K/uL  BASIC METABOLIC PANEL      Result Value Range   Sodium 128 (*) 135 - 145 mEq/L   Potassium 4.4  3.5 - 5.1 mEq/L   Chloride 98  96 - 112 mEq/L   CO2 21  19 - 32 mEq/L   Glucose, Bld 140 (*) 70 - 99 mg/dL   BUN 21  6 - 23 mg/dL   Creatinine, Ser 5.78 (*) 0.50 - 1.10 mg/dL   Calcium 8.6  8.4 - 46.9 mg/dL   GFR calc non Af Amer >90  >90 mL/min   GFR calc Af Amer >90  >90 mL/min  GLUCOSE, CAPILLARY      Result Value Range   Glucose-Capillary 135 (*) 70 - 99 mg/dL  BASIC METABOLIC PANEL      Result Value Range   Sodium 125 (*) 135 - 145 mEq/L   Potassium 5.0  3.5 - 5.1 mEq/L   Chloride 96  96 - 112 mEq/L   CO2 22  19 - 32 mEq/L   Glucose, Bld 122 (*) 70 - 99 mg/dL   BUN 23  6 - 23 mg/dL   Creatinine, Ser 6.29 (*) 0.50 - 1.10 mg/dL   Calcium 9.1  8.4 - 52.8 mg/dL   GFR calc non Af Amer >90  >90 mL/min   GFR calc Af Amer >90  >  90 mL/min  CBC WITH DIFFERENTIAL      Result Value Range   WBC 13.7 (*) 4.0 - 10.5 K/uL   RBC 2.79 (*) 3.87 - 5.11 MIL/uL   Hemoglobin 8.6 (*) 12.0 - 15.0 g/dL   HCT 11.9 (*) 14.7 - 82.9 %   MCV 89.6  78.0 - 100.0 fL   MCH 30.8  26.0 - 34.0 pg   MCHC 34.4  30.0 - 36.0 g/dL   RDW 56.2 (*) 13.0 - 86.5 %   Platelets 335  150 - 400 K/uL   Neutrophils Relative % 78 (*) 43 - 77 %   Neutro Abs 10.6 (*) 1.7 - 7.7 K/uL   Lymphocytes Relative 8 (*) 12 - 46 %   Lymphs Abs 1.1  0.7 - 4.0 K/uL   Monocytes Relative 13 (*) 3 - 12 %   Monocytes Absolute 1.8 (*)  0.1 - 1.0 K/uL   Eosinophils Relative 1  0 - 5 %   Eosinophils Absolute 0.1  0.0 - 0.7 K/uL   Basophils Relative 0  0 - 1 %   Basophils Absolute 0.0  0.0 - 0.1 K/uL  COMPREHENSIVE METABOLIC PANEL      Result Value Range   Sodium 129 (*) 135 - 145 mEq/L   Potassium 4.3  3.5 - 5.1 mEq/L   Chloride 98  96 - 112 mEq/L   CO2 25  19 - 32 mEq/L   Glucose, Bld 112 (*) 70 - 99 mg/dL   BUN 19  6 - 23 mg/dL   Creatinine, Ser 7.84 (*) 0.50 - 1.10 mg/dL   Calcium 8.7  8.4 - 69.6 mg/dL   Total Protein 5.0 (*) 6.0 - 8.3 g/dL   Albumin 1.9 (*) 3.5 - 5.2 g/dL   AST 25  0 - 37 U/L   ALT 42 (*) 0 - 35 U/L   Alkaline Phosphatase 123 (*) 39 - 117 U/L   Total Bilirubin 0.2 (*) 0.3 - 1.2 mg/dL   GFR calc non Af Amer >90  >90 mL/min   GFR calc Af Amer >90  >90 mL/min  MAGNESIUM      Result Value Range   Magnesium 1.9  1.5 - 2.5 mg/dL  PHOSPHORUS      Result Value Range   Phosphorus 2.9  2.3 - 4.6 mg/dL  DIFFERENTIAL      Result Value Range   Neutrophils Relative % 71  43 - 77 %   Neutro Abs 6.2  1.7 - 7.7 K/uL   Lymphocytes Relative 13  12 - 46 %   Lymphs Abs 1.1  0.7 - 4.0 K/uL   Monocytes Relative 13 (*) 3 - 12 %   Monocytes Absolute 1.2 (*) 0.1 - 1.0 K/uL   Eosinophils Relative 3  0 - 5 %   Eosinophils Absolute 0.3  0.0 - 0.7 K/uL   Basophils Relative 0  0 - 1 %   Basophils Absolute 0.0  0.0 - 0.1 K/uL  PREALBUMIN      Result Value Range   Prealbumin 11.7 (*) 17.0 - 34.0 mg/dL  TRIGLYCERIDES      Result Value Range   Triglycerides 75  <150 mg/dL  BASIC METABOLIC PANEL      Result Value Range   Sodium 129 (*) 135 - 145 mEq/L   Potassium 4.4  3.5 - 5.1 mEq/L   Chloride 97  96 - 112 mEq/L   CO2 26  19 - 32 mEq/L   Glucose, Bld 112 (*)  70 - 99 mg/dL   BUN 18  6 - 23 mg/dL   Creatinine, Ser 9.60 (*) 0.50 - 1.10 mg/dL   Calcium 8.5  8.4 - 45.4 mg/dL   GFR calc non Af Amer >90  >90 mL/min   GFR calc Af Amer >90  >90 mL/min  BASIC METABOLIC PANEL      Result Value Range   Sodium 134  (*) 135 - 145 mEq/L   Potassium 4.3  3.5 - 5.1 mEq/L   Chloride 100  96 - 112 mEq/L   CO2 27  19 - 32 mEq/L   Glucose, Bld 105 (*) 70 - 99 mg/dL   BUN 18  6 - 23 mg/dL   Creatinine, Ser 0.98 (*) 0.50 - 1.10 mg/dL   Calcium 9.0  8.4 - 11.9 mg/dL   GFR calc non Af Amer >90  >90 mL/min   GFR calc Af Amer >90  >90 mL/min  COMPREHENSIVE METABOLIC PANEL      Result Value Range   Sodium 132 (*) 135 - 145 mEq/L   Potassium 4.2  3.5 - 5.1 mEq/L   Chloride 99  96 - 112 mEq/L   CO2 26  19 - 32 mEq/L   Glucose, Bld 113 (*) 70 - 99 mg/dL   BUN 14  6 - 23 mg/dL   Creatinine, Ser 1.47 (*) 0.50 - 1.10 mg/dL   Calcium 9.0  8.4 - 82.9 mg/dL   Total Protein 5.4 (*) 6.0 - 8.3 g/dL   Albumin 2.1 (*) 3.5 - 5.2 g/dL   AST 46 (*) 0 - 37 U/L   ALT 48 (*) 0 - 35 U/L   Alkaline Phosphatase 138 (*) 39 - 117 U/L   Total Bilirubin 0.2 (*) 0.3 - 1.2 mg/dL   GFR calc non Af Amer >90  >90 mL/min   GFR calc Af Amer >90  >90 mL/min  MAGNESIUM      Result Value Range   Magnesium 2.0  1.5 - 2.5 mg/dL  PHOSPHORUS      Result Value Range   Phosphorus 3.3  2.3 - 4.6 mg/dL  CBC      Result Value Range   WBC 7.9  4.0 - 10.5 K/uL   RBC 2.54 (*) 3.87 - 5.11 MIL/uL   Hemoglobin 7.7 (*) 12.0 - 15.0 g/dL   HCT 56.2 (*) 13.0 - 86.5 %   MCV 89.0  78.0 - 100.0 fL   MCH 30.3  26.0 - 34.0 pg   MCHC 34.1  30.0 - 36.0 g/dL   RDW 78.4 (*) 69.6 - 29.5 %   Platelets 321  150 - 400 K/uL  CBC WITH DIFFERENTIAL      Result Value Range   WBC 7.5  4.0 - 10.5 K/uL   RBC 3.82 (*) 3.87 - 5.11 MIL/uL   Hemoglobin 11.7 (*) 12.0 - 15.0 g/dL   HCT 28.4 (*) 13.2 - 44.0 %   MCV 86.1  78.0 - 100.0 fL   MCH 30.6  26.0 - 34.0 pg   MCHC 35.6  30.0 - 36.0 g/dL   RDW 10.2 (*) 72.5 - 36.6 %   Platelets 294  150 - 400 K/uL   Neutrophils Relative % 68  43 - 77 %   Neutro Abs 5.1  1.7 - 7.7 K/uL   Lymphocytes Relative 16  12 - 46 %   Lymphs Abs 1.2  0.7 - 4.0 K/uL   Monocytes Relative 13 (*) 3 - 12 %  Monocytes Absolute 1.0  0.1 - 1.0  K/uL   Eosinophils Relative 3  0 - 5 %   Eosinophils Absolute 0.2  0.0 - 0.7 K/uL   Basophils Relative 0  0 - 1 %   Basophils Absolute 0.0  0.0 - 0.1 K/uL  BASIC METABOLIC PANEL      Result Value Range   Sodium 131 (*) 135 - 145 mEq/L   Potassium 4.3  3.5 - 5.1 mEq/L   Chloride 97  96 - 112 mEq/L   CO2 26  19 - 32 mEq/L   Glucose, Bld 115 (*) 70 - 99 mg/dL   BUN 15  6 - 23 mg/dL   Creatinine, Ser 9.52 (*) 0.50 - 1.10 mg/dL   Calcium 9.3  8.4 - 84.1 mg/dL   GFR calc non Af Amer >90  >90 mL/min   GFR calc Af Amer >90  >90 mL/min  OCCULT BLOOD, POC DEVICE      Result Value Range   Fecal Occult Bld POSITIVE (*) NEGATIVE  CG4 I-STAT (LACTIC ACID)      Result Value Range   Lactic Acid, Venous 0.69  0.5 - 2.2 mmol/L  TYPE AND SCREEN      Result Value Range   ABO/RH(D) A POS     Antibody Screen NEG     Sample Expiration 12/08/2012    ABO/RH      Result Value Range   ABO/RH(D) A POS    PREPARE RBC (CROSSMATCH)      Result Value Range   Order Confirmation ORDER PROCESSED BY BLOOD BANK    TYPE AND SCREEN      Result Value Range   ABO/RH(D) A POS     Antibody Screen NEG     Sample Expiration 01/01/2013     Unit Number [id number]     Blood Component Type RED CELLS,LR     Unit division 00     Status of Unit ISSUED,FINAL     Transfusion Status OK TO TRANSFUSE     Crossmatch Result Compatible     Unit Number [id number]     Blood Component Type RED CELLS,LR     Unit division 00     Status of Unit ISSUED,FINAL     Transfusion Status OK TO TRANSFUSE     Crossmatch Result Compatible      Time coordinating discharge: 45 minutes  Signed: Pearla Dubonnet, MD 01/01/2013, 7:42 AM

## 2013-01-01 NOTE — Progress Notes (Signed)
General Surgery [facility]  Patient seen and examined.  Anticipating transfer to [facility] this evening.  Discussed with Dr. Bosie Clos regarding high ileostomy output.  Will follow up at [facility] office.  Velora Heckler, MD, [facility] Office: [phone]

## 2013-01-01 NOTE — Progress Notes (Addendum)
Patient ID: Tamara [name], female   DOB: 05-Mar-1929, 79 y.o.   MRN: [id number] [facility] Gastroenterology Progress Note  Tamara [name] 79 y.o. 04-Aug-1929   Subjective: Called by surgery to reevaluate Tamara [name] due to high ileostomy output. She is currently lying in recliner complaining of severe nausea despite antiemetics that she has recently received.  Objective: Vital signs in last 24 hours: Filed Vitals:   01/01/13 0653  BP: 177/70  Pulse: 86  Temp: 97.7 F (36.5 C)  Resp: 19    Physical Exam: Gen: lethargic, elderly, frail, uncomfortable, thin Abd: ostomy intact with liquid stool in bag, midline surgical dressing in place and not removed, tender to minimal palpation of upper quadrants  Lab Results:  Recent Labs  12/30/12 0510  NA 131*  K 4.3  CL 97  CO2 26  GLUCOSE 115*  BUN 15  CREATININE 0.47*  CALCIUM 9.3   No results found for this basename: AST, ALT, ALKPHOS, BILITOT, PROT, ALBUMIN,  in the last 72 hours  Recent Labs  12/30/12 0510  WBC 7.5  NEUTROABS 5.1  HGB 11.7*  HCT 32.9*  MCV 86.1  PLT 294   No results found for this basename: LABPROT, INR,  in the last 72 hours cecal distention, persistent, possible anastomotic obstruction  Postoperative diagnosis: anastomotic obstruction, cecal distention  Procedure:  1. Exploratory laparotomy  2. Right colectomy with resection of anastomosis and long Hartmanns pouch  3. End ileostomy    Assessment/Plan: 79 yo s/p partial colonic resection on 12/07/12 for colon cancer with repeat surgery 10 days ago (12/22/12) due to anastomotic obstruction with right colectomy with resection of the anastomosis and long Hartmann's pouch. End ileostomy was also placed and patient has been having increased ostomy output. 1600 cc yesterday in ostomy. 200 cc thus far today compared with 700 cc in same time frame yesterday. Her current severe nausea will put her at increased risk for dehydration if she leaves the hospital today.  Her increased ostomy output will be a chronic problem and will need to be managed with aggressive PO intake, antimotility agents such as Lomotil that she is on. She may also benefit from Cholestyramine although she is not likely to tolerate that right now due to her nausea. Would continue Lomotil but increase to 1 tab PO BID (can increase further if needed to a max of 2 tabs PO QID), antiemetics, encourage PO intake. Trial of Cholestyramine tomorrow if ileostomy output today exceeds 1000 cc. Supportive care. Will follow. If she shows improvement in her PO intake today, then would continue current recs at SNF as above.   Jihad Brownlow C. 01/01/2013, 12:32 PM

## 2013-01-01 NOTE — Progress Notes (Signed)
Discussed impending transfer to [facility] place with patient and spouse.  Stressed to patient that she must push oral fluids to replace what is being lost through the ileostomy so she does not get dehydrated.  Patient and spouse voiced understanding of this.

## 2013-01-01 NOTE — Progress Notes (Signed)
Occupational Therapy Treatment Patient Details Name: Tamara [name] MRN: [id number] DOB: 22-Jun-1929 Today's Date: 01/01/2013 Time: 2130-8657 OT Time Calculation (min): 29 min  OT Assessment / Plan / Recommendation  History of present illness 79 y/o WF admitted with GI bleed and s/p partial colectomy after sigmoidoscopy revealed mass consistent with malignancy.;11/28 return to surgery due to anastomotic obstruction; s/p Rt colectomy with ileostomy   OT comments  Pt progressing towards goals. Education provided to pt and spouse. Continue to recommend SNF for d/c.   Follow Up Recommendations  SNF;Supervision/Assistance - 24 hour    Barriers to Discharge       Equipment Recommendations  3 in 1 bedside comode    Recommendations for Other Services    Frequency Min 2X/week   Progress towards OT Goals Progress towards OT goals: Progressing toward goals  Plan Discharge plan remains appropriate    Precautions / Restrictions Precautions Precautions: Fall Precaution Comments: abd surgery; ileostomy Restrictions Weight Bearing Restrictions: No   Pertinent Vitals/Pain Pain 3/10 in abdomen. Nurse notified. Repositioned.     ADL  Grooming: Teeth care;Wash/dry hands;Wash/dry face;Min guard (applied lotion on face) Where Assessed - Grooming: Supported standing Upper Body Bathing: Right arm;Left arm;Set up;Supervision/safety Where Assessed - Upper Body Bathing: Supported sitting Lower Body Bathing: Min guard Where Assessed - Lower Body Bathing: Supported sit to stand Lower Body Dressing: Minimal assistance (socks) Where Assessed - Lower Body Dressing: Supported sitting Toilet Transfer: Min Sports administrator Method: Sit to Barista: Bedside commode Toileting - Clothing Manipulation and Hygiene: Minimal assistance Where Assessed - Engineer, mining and Hygiene: Sit to stand from 3-in-1 or toilet Equipment Used: Gait  belt;Reacher;Long-handled sponge;Long-handled shoe horn;Sock aid;Rolling walker Transfers/Ambulation Related to ADLs: Min guard for ambulation.  ADL Comments: Educated on energy conservation techniques. Practiced with AE for LB ADLs-spouse present.     OT Diagnosis:    OT Problem List:   OT Treatment Interventions:     OT Goals(current goals can now be found in the care plan section) Acute Rehab OT Goals Patient Stated Goal: to get well OT Goal Formulation: With patient Time For Goal Achievement: 01/02/13 Potential to Achieve Goals: Good ADL Goals Pt Will Perform Lower Body Bathing: with supervision;with adaptive equipment;sit to/from stand;with set-up Pt Will Perform Lower Body Dressing: with set-up;with supervision;with adaptive equipment;sit to/from stand Pt Will Transfer to Toilet: with supervision (3 in 1 over commode) Pt Will Perform Toileting - Clothing Manipulation and hygiene: with supervision;sit to/from stand Additional ADL Goal #1: Pt will independently verbalize 3/3 energy conservation techniques.    Visit Information  Last OT Received On: 01/01/13 Assistance Needed: +1 History of Present Illness: 79 y/o WF admitted with GI bleed and s/p partial colectomy after sigmoidoscopy revealed mass consistent with malignancy.;11/28 return to surgery due to anastomotic obstruction; s/p Rt colectomy with ileostomy    Subjective Data      Prior Functioning       Cognition  Cognition Arousal/Alertness: Awake/alert Behavior During Therapy: WFL for tasks assessed/performed Overall Cognitive Status: Within Functional Limits for tasks assessed    Mobility  Bed Mobility Bed Mobility: Not assessed Transfers Transfers: Sit to Stand;Stand to Sit Sit to Stand: 4: Min guard;With upper extremity assist;From chair/3-in-1 Stand to Sit: 4: Min guard;4: Min assist;To chair/3-in-1 Details for Transfer Assistance: Min guard for safety.      Exercises      Balance     End of Session  OT - End of Session Equipment Utilized During  Treatment: Gait belt;Rolling walker Activity Tolerance: Patient tolerated treatment well Patient left: in chair;with call bell/phone within reach;with family/visitor present Nurse Communication: Other (comment) (pain level; wants AE from [employer] )  GO     Earlie Raveling OTR/L [phone] 01/01/2013, 12:48 PM

## 2013-01-01 NOTE — Progress Notes (Signed)
Seen and agreed 02/22/2012 Eris Breck Elizabeth PTA [phone] pager [phone] office    

## 2013-01-03 ENCOUNTER — Telehealth: Payer: Self-pay | Admitting: Cardiovascular Disease

## 2013-01-03 ENCOUNTER — Non-Acute Institutional Stay (SKILLED_NURSING_FACILITY): Payer: Medicare Other | Admitting: Internal Medicine

## 2013-01-03 DIAGNOSIS — D62 Acute posthemorrhagic anemia: Secondary | ICD-10-CM | POA: Diagnosis not present

## 2013-01-03 DIAGNOSIS — I10 Essential (primary) hypertension: Secondary | ICD-10-CM | POA: Diagnosis not present

## 2013-01-03 DIAGNOSIS — F329 Major depressive disorder, single episode, unspecified: Secondary | ICD-10-CM | POA: Diagnosis not present

## 2013-01-03 DIAGNOSIS — C189 Malignant neoplasm of colon, unspecified: Secondary | ICD-10-CM

## 2013-01-03 NOTE — Telephone Encounter (Signed)
Tamara [name] says she has an appt tomorrow here.  Said it related to her pacemaker  She cannot get here  Wants someone to come to [facility] to see here.  Says the appt is very important ( i do not see appt)  Please call  Phone number is husbands cell phone  She does not have a phone in her room.

## 2013-01-03 NOTE — Telephone Encounter (Signed)
Returned call.  Left message to call back before 4pm.  Pt is scheduled to perform a pacemaker transmission tomorrow.  She does not have an appt in the office and can do this from home, at any time on tomorrow.  Also, our office does not perform home visits and if pt had an appt, then she would need to make arrangements for transportation or reschedule appt when it is more convenient.  Pt's next office visit is due in September 2015 w/ Dr. Royann Shivers.  Pt/EC will be informed when call returned.

## 2013-01-09 NOTE — Telephone Encounter (Signed)
After attempts to get in contact with Tamara [name] I finally spoke with his wife (patient) about doing remote transmissions from [facility]. I told them to bring the monitor to the facility and then have the nurse to call me for instructions. Wife voiced understanding and stated that they would do this tomorrow.

## 2013-01-10 NOTE — Telephone Encounter (Signed)
Patient stated that her husband was unable to find the [employer] box. She asked if someone could come out and check her. I informed Jana Half from [facility] about patient's situation. She stated that she would have someone to check the patient. Patient informed of plan and voiced satisfaction with this.

## 2013-01-12 ENCOUNTER — Telehealth: Payer: Self-pay | Admitting: Cardiovascular Disease

## 2013-01-12 NOTE — Telephone Encounter (Signed)
Please call he has found the piece that was lost and is now ready to use it , but does not know how to set it up .Marland Kitchen Please Call ...    Thanks

## 2013-01-12 NOTE — Telephone Encounter (Signed)
Verbal instructions given. Husband voiced understanding.

## 2013-01-15 ENCOUNTER — Encounter (INDEPENDENT_AMBULATORY_CARE_PROVIDER_SITE_OTHER): Payer: Self-pay | Admitting: General Surgery

## 2013-01-15 ENCOUNTER — Other Ambulatory Visit: Payer: Self-pay | Admitting: Hematology & Oncology

## 2013-01-15 ENCOUNTER — Encounter (INDEPENDENT_AMBULATORY_CARE_PROVIDER_SITE_OTHER): Payer: Self-pay

## 2013-01-15 ENCOUNTER — Ambulatory Visit (INDEPENDENT_AMBULATORY_CARE_PROVIDER_SITE_OTHER): Payer: Medicare Other | Admitting: General Surgery

## 2013-01-15 VITALS — BP 130/64 | HR 88 | Temp 99.0°F | Resp 14 | Ht 65.5 in | Wt 118.0 lb

## 2013-01-15 DIAGNOSIS — Z09 Encounter for follow-up examination after completed treatment for conditions other than malignant neoplasm: Secondary | ICD-10-CM

## 2013-01-15 NOTE — Progress Notes (Signed)
Subjective:     Patient ID: Tamara [name], female   DOB: [birth year], 79 y.o.   MRN: [id number]  HPI This is an 79 year old female who I met while I was in the hospital following her. She had undergone a transverse colectomy. The pathology of that revealed a 3.2 cm invasive well-differentiated adenocarcinoma that is a T4 lesion. She had 16 benign nodes noted but there was a single soft tissue tumor satellite nodule making this a T1c tumor. Following this she developed what appeared to be a anastomotic obstruction. I needed to take her back to the operating room and did a segmental resection of her anastomosis of her right,remaining transverse and part of left colon. This was due to the dilation and serosal tears in her right colon This showed a benign stenotic colorectal mucosa. I left her with a Hartman's pouch. And I gave her an end ileostomy. She eventually did well and was discharged. She is now at [facility]. Her appetite is increasing. She is full quickly but is eating well now. She has loss of weight during this. She is now ambulating with a walker. She's doing her physical therapy daily. She has not seen medical oncology and due her prolonged course.  Review of Systems     Objective:   Physical Exam Incision healing with hydrogel, no infection Ostomy pink and functional    Assessment:     S/p colectomy/ileostomy for anastomotic obstruction Colon cancer     Plan:     I think she is progressing as I would expect now. I made the decision for her to leave her facility will be based on her therapies in her ability to be safe in her own home. As long as if she is able to care for her stoma she can go home whenever this is acceptable from a therapy standpoint and a safety standpoint which would be determined at her facility. She should continue to need and I recommended any sugar to her today. they can continue her dressing changes once daily I will see her back in about 3 weeks that she may  need to have this silver nitrated at that point. Her stoma is otherwise very functional. I will also refer her back to see Dr. Myna Hidalgo for evaluation for her newly diagnosed colon cancer.

## 2013-01-22 ENCOUNTER — Telehealth: Payer: Self-pay | Admitting: Hematology & Oncology

## 2013-01-22 NOTE — Telephone Encounter (Signed)
Husband called back changed 1-8 to 1-23 due to time. RN aware will let Dr. Myna Hidalgo know

## 2013-01-25 DIAGNOSIS — C50919 Malignant neoplasm of unspecified site of unspecified female breast: Secondary | ICD-10-CM | POA: Diagnosis not present

## 2013-01-25 DIAGNOSIS — D72829 Elevated white blood cell count, unspecified: Secondary | ICD-10-CM | POA: Diagnosis not present

## 2013-01-25 DIAGNOSIS — Z853 Personal history of malignant neoplasm of breast: Secondary | ICD-10-CM | POA: Diagnosis not present

## 2013-01-25 DIAGNOSIS — R197 Diarrhea, unspecified: Secondary | ICD-10-CM | POA: Diagnosis not present

## 2013-01-25 DIAGNOSIS — F3289 Other specified depressive episodes: Secondary | ICD-10-CM | POA: Diagnosis not present

## 2013-01-25 DIAGNOSIS — K55059 Acute (reversible) ischemia of intestine, part and extent unspecified: Secondary | ICD-10-CM | POA: Diagnosis not present

## 2013-01-25 DIAGNOSIS — R1312 Dysphagia, oropharyngeal phase: Secondary | ICD-10-CM | POA: Diagnosis not present

## 2013-01-25 DIAGNOSIS — F411 Generalized anxiety disorder: Secondary | ICD-10-CM | POA: Diagnosis not present

## 2013-01-25 DIAGNOSIS — E871 Hypo-osmolality and hyponatremia: Secondary | ICD-10-CM | POA: Diagnosis not present

## 2013-01-25 DIAGNOSIS — D0590 Unspecified type of carcinoma in situ of unspecified breast: Secondary | ICD-10-CM | POA: Diagnosis not present

## 2013-01-25 DIAGNOSIS — K922 Gastrointestinal hemorrhage, unspecified: Secondary | ICD-10-CM | POA: Diagnosis not present

## 2013-01-25 DIAGNOSIS — M6281 Muscle weakness (generalized): Secondary | ICD-10-CM | POA: Diagnosis not present

## 2013-01-25 DIAGNOSIS — E878 Other disorders of electrolyte and fluid balance, not elsewhere classified: Secondary | ICD-10-CM | POA: Diagnosis not present

## 2013-01-25 DIAGNOSIS — Z950 Presence of cardiac pacemaker: Secondary | ICD-10-CM | POA: Diagnosis not present

## 2013-01-25 DIAGNOSIS — C189 Malignant neoplasm of colon, unspecified: Secondary | ICD-10-CM | POA: Diagnosis not present

## 2013-01-25 DIAGNOSIS — C772 Secondary and unspecified malignant neoplasm of intra-abdominal lymph nodes: Secondary | ICD-10-CM | POA: Diagnosis not present

## 2013-01-25 DIAGNOSIS — I2510 Atherosclerotic heart disease of native coronary artery without angina pectoris: Secondary | ICD-10-CM | POA: Diagnosis not present

## 2013-01-25 DIAGNOSIS — K5289 Other specified noninfective gastroenteritis and colitis: Secondary | ICD-10-CM | POA: Diagnosis not present

## 2013-01-25 DIAGNOSIS — T8140XA Infection following a procedure, unspecified, initial encounter: Secondary | ICD-10-CM | POA: Diagnosis not present

## 2013-01-25 DIAGNOSIS — R262 Difficulty in walking, not elsewhere classified: Secondary | ICD-10-CM | POA: Diagnosis not present

## 2013-01-25 DIAGNOSIS — I10 Essential (primary) hypertension: Secondary | ICD-10-CM | POA: Diagnosis not present

## 2013-01-25 DIAGNOSIS — Z932 Ileostomy status: Secondary | ICD-10-CM | POA: Diagnosis not present

## 2013-01-25 DIAGNOSIS — C184 Malignant neoplasm of transverse colon: Secondary | ICD-10-CM | POA: Diagnosis not present

## 2013-01-25 DIAGNOSIS — R509 Fever, unspecified: Secondary | ICD-10-CM | POA: Diagnosis not present

## 2013-01-29 DIAGNOSIS — C189 Malignant neoplasm of colon, unspecified: Secondary | ICD-10-CM | POA: Insufficient documentation

## 2013-01-30 ENCOUNTER — Other Ambulatory Visit: Payer: Self-pay | Admitting: *Deleted

## 2013-01-30 MED ORDER — DIPHENOXYLATE-ATROPINE 2.5-0.025 MG PO TABS: ORAL_TABLET | ORAL | Status: DC

## 2013-01-30 MED ORDER — DIPHENOXYLATE-ATROPINE 2.5-0.025 MG PO TABS: 1.0000 | ORAL_TABLET | Freq: Every day | ORAL | Status: DC

## 2013-01-30 NOTE — Telephone Encounter (Signed)
rx filled per protocol  

## 2013-02-01 ENCOUNTER — Other Ambulatory Visit: Payer: Medicare Other | Admitting: Lab

## 2013-02-01 ENCOUNTER — Ambulatory Visit: Payer: Medicare Other | Admitting: Hematology & Oncology

## 2013-02-05 ENCOUNTER — Non-Acute Institutional Stay (SKILLED_NURSING_FACILITY): Payer: Medicare Other | Admitting: Adult Health

## 2013-02-05 DIAGNOSIS — E871 Hypo-osmolality and hyponatremia: Secondary | ICD-10-CM | POA: Diagnosis not present

## 2013-02-15 ENCOUNTER — Non-Acute Institutional Stay (SKILLED_NURSING_FACILITY): Payer: Medicare Other | Admitting: Internal Medicine

## 2013-02-15 DIAGNOSIS — R197 Diarrhea, unspecified: Secondary | ICD-10-CM | POA: Diagnosis not present

## 2013-02-15 DIAGNOSIS — F329 Major depressive disorder, single episode, unspecified: Secondary | ICD-10-CM | POA: Diagnosis not present

## 2013-02-15 DIAGNOSIS — F3289 Other specified depressive episodes: Secondary | ICD-10-CM

## 2013-02-15 DIAGNOSIS — I10 Essential (primary) hypertension: Secondary | ICD-10-CM | POA: Diagnosis not present

## 2013-02-15 DIAGNOSIS — I2510 Atherosclerotic heart disease of native coronary artery without angina pectoris: Secondary | ICD-10-CM

## 2013-02-15 NOTE — Progress Notes (Signed)
         PROGRESS NOTE  DATE: 02/15/2013  FACILITY: Nursing Home Location: [facility]  LEVEL OF CARE: SNF (31)  Routine Visit  CHIEF COMPLAINT:  Manage hypertension, CAD and depression  HISTORY OF PRESENT ILLNESS:  REASSESSMENT OF ONGOING PROBLEM(S):  CAD: The angina has been stable. The patient denies dyspnea on exertion, orthopnea, pedal edema, palpitations and paroxysmal nocturnal dyspnea. No complications noted from the medication presently being used.  HTN: Pt 's HTN remains stable.  Denies CP, sob, DOE, pedal edema, headaches, dizziness or visual disturbances.  No complications from the medications currently being used.  Last BP : 135/79, 127/90, 126/59.  DEPRESSION: The depression remains stable. Patient denies ongoing feelings of sadness, insomnia, anedhonia or lack of appetite. No complications reported from the medications currently being used. Staff do not report behavioral problems.  PAST MEDICAL HISTORY : Reviewed.  No changes.  CURRENT MEDICATIONS: Reviewed per [facility]  REVIEW OF SYSTEMS:  GENERAL: no change in appetite, no fatigue, no weight changes, no fever, chills or weakness RESPIRATORY: no cough, SOB, DOE, wheezing, hemoptysis CARDIAC: no chest pain, edema or palpitations GI: no abdominal pain, diarrhea, constipation, heart burn, nausea or vomiting  PHYSICAL EXAMINATION  VS:  T 97.3       P 87      RR 18      BP     POX % 98      GENERAL: no acute distress, normal body habitus EYES: conjunctivae normal, sclerae normal, normal eye lids NECK: supple, trachea midline, no neck masses, no thyroid tenderness, no thyromegaly LYMPHATICS: no LAN in the neck, no supraclavicular LAN RESPIRATORY: breathing is even & unlabored, BS CTAB CARDIAC: RRR, no murmur,no extra heart sounds, no edema GI: abdomen soft, normal BS, no masses, no tenderness, no hepatomegaly, no splenomegaly PSYCHIATRIC: the patient is alert & oriented to person, affect &  behavior appropriate  LABS/RADIOLOGY:  1-15 CO2 16 otherwise BMP normal 12-14 CBC normal  ASSESSMENT/PLAN:  CAD-stable Hypertension-stable Depression-denies ongoing symptoms Chronic diarrhea-on lomotil Protein calorie malnutrition-continue meds pass Colon cancer-status post resection and reanastomosis Check liver profile  CPT CODE: 55732

## 2013-02-16 ENCOUNTER — Ambulatory Visit (HOSPITAL_BASED_OUTPATIENT_CLINIC_OR_DEPARTMENT_OTHER): Payer: Medicare Other | Admitting: Hematology & Oncology

## 2013-02-16 ENCOUNTER — Encounter: Payer: Self-pay | Admitting: Hematology & Oncology

## 2013-02-16 ENCOUNTER — Other Ambulatory Visit (HOSPITAL_BASED_OUTPATIENT_CLINIC_OR_DEPARTMENT_OTHER): Payer: Medicare Other | Admitting: Lab

## 2013-02-16 VITALS — BP 117/50 | HR 74 | Temp 97.6°F | Resp 14 | Ht 65.0 in | Wt 125.0 lb

## 2013-02-16 DIAGNOSIS — C184 Malignant neoplasm of transverse colon: Secondary | ICD-10-CM | POA: Diagnosis not present

## 2013-02-16 DIAGNOSIS — D0590 Unspecified type of carcinoma in situ of unspecified breast: Secondary | ICD-10-CM

## 2013-02-16 DIAGNOSIS — C189 Malignant neoplasm of colon, unspecified: Secondary | ICD-10-CM

## 2013-02-16 DIAGNOSIS — D0591 Unspecified type of carcinoma in situ of right breast: Secondary | ICD-10-CM

## 2013-02-16 DIAGNOSIS — Z9189 Other specified personal risk factors, not elsewhere classified: Secondary | ICD-10-CM

## 2013-02-16 DIAGNOSIS — Z853 Personal history of malignant neoplasm of breast: Secondary | ICD-10-CM | POA: Diagnosis not present

## 2013-02-16 DIAGNOSIS — C772 Secondary and unspecified malignant neoplasm of intra-abdominal lymph nodes: Secondary | ICD-10-CM

## 2013-02-16 DIAGNOSIS — C50919 Malignant neoplasm of unspecified site of unspecified female breast: Secondary | ICD-10-CM

## 2013-02-16 LAB — CBC WITH DIFFERENTIAL (CANCER CENTER ONLY)
BASO#: 0 10*3/uL (ref 0.0–0.2)
BASO%: 0.2 % (ref 0.0–2.0)
EOS ABS: 0.2 10*3/uL (ref 0.0–0.5)
EOS%: 1.8 % (ref 0.0–7.0)
HEMATOCRIT: 32.3 % — AB (ref 34.8–46.6)
HEMOGLOBIN: 11 g/dL — AB (ref 11.6–15.9)
LYMPH#: 2.1 10*3/uL (ref 0.9–3.3)
LYMPH%: 25.4 % (ref 14.0–48.0)
MCH: 30.8 pg (ref 26.0–34.0)
MCHC: 34.1 g/dL (ref 32.0–36.0)
MCV: 91 fL (ref 81–101)
MONO#: 1.3 10*3/uL — ABNORMAL HIGH (ref 0.1–0.9)
MONO%: 15.5 % — AB (ref 0.0–13.0)
NEUT#: 4.8 10*3/uL (ref 1.5–6.5)
NEUT%: 57.1 % (ref 39.6–80.0)
Platelets: 136 10*3/uL — ABNORMAL LOW (ref 145–400)
RBC: 3.57 10*6/uL — AB (ref 3.70–5.32)
RDW: 16.7 % — ABNORMAL HIGH (ref 11.1–15.7)
WBC: 8.4 10*3/uL (ref 3.9–10.0)

## 2013-02-16 LAB — COMPREHENSIVE METABOLIC PANEL
ALBUMIN: 4.2 g/dL (ref 3.5–5.2)
ALT: 23 U/L (ref 0–35)
AST: 19 U/L (ref 0–37)
Alkaline Phosphatase: 90 U/L (ref 39–117)
BUN: 37 mg/dL — ABNORMAL HIGH (ref 6–23)
CALCIUM: 9.6 mg/dL (ref 8.4–10.5)
CO2: 20 mEq/L (ref 19–32)
CREATININE: 0.97 mg/dL (ref 0.50–1.10)
Chloride: 100 mEq/L (ref 96–112)
GLUCOSE: 104 mg/dL — AB (ref 70–99)
POTASSIUM: 5.1 meq/L (ref 3.5–5.3)
Sodium: 125 mEq/L — ABNORMAL LOW (ref 135–145)
Total Bilirubin: 0.5 mg/dL (ref 0.3–1.2)
Total Protein: 7.2 g/dL (ref 6.0–8.3)

## 2013-02-20 ENCOUNTER — Non-Acute Institutional Stay (SKILLED_NURSING_FACILITY): Payer: Medicare Other | Admitting: Adult Health

## 2013-02-20 DIAGNOSIS — F329 Major depressive disorder, single episode, unspecified: Secondary | ICD-10-CM

## 2013-02-20 DIAGNOSIS — I2510 Atherosclerotic heart disease of native coronary artery without angina pectoris: Secondary | ICD-10-CM

## 2013-02-20 DIAGNOSIS — T8140XA Infection following a procedure, unspecified, initial encounter: Secondary | ICD-10-CM | POA: Diagnosis not present

## 2013-02-20 DIAGNOSIS — T8149XA Infection following a procedure, other surgical site, initial encounter: Secondary | ICD-10-CM

## 2013-02-20 DIAGNOSIS — I10 Essential (primary) hypertension: Secondary | ICD-10-CM | POA: Diagnosis not present

## 2013-02-20 DIAGNOSIS — F3289 Other specified depressive episodes: Secondary | ICD-10-CM

## 2013-02-20 DIAGNOSIS — C189 Malignant neoplasm of colon, unspecified: Secondary | ICD-10-CM | POA: Diagnosis not present

## 2013-02-20 DIAGNOSIS — E871 Hypo-osmolality and hyponatremia: Secondary | ICD-10-CM

## 2013-02-21 ENCOUNTER — Encounter: Payer: Self-pay | Admitting: Hematology & Oncology

## 2013-02-21 DIAGNOSIS — E871 Hypo-osmolality and hyponatremia: Secondary | ICD-10-CM | POA: Insufficient documentation

## 2013-02-21 DIAGNOSIS — T8149XA Infection following a procedure, other surgical site, initial encounter: Secondary | ICD-10-CM | POA: Insufficient documentation

## 2013-02-21 DIAGNOSIS — C189 Malignant neoplasm of colon, unspecified: Secondary | ICD-10-CM

## 2013-02-21 HISTORY — DX: Malignant neoplasm of colon, unspecified: C18.9

## 2013-02-21 NOTE — Progress Notes (Signed)
Patient ID: Tamara [name], female   DOB: 11-25-1929, 80 y.o.   MRN: [id number]         PROGRESS NOTE  DATE: 02/05/2013  FACILITY:  [facility]  LEVEL OF CARE: SNF (31)  Acute Visit  CHIEF COMPLAINT:  Manage hyponatremia  HISTORY OF PRESENT ILLNESS: This is an 80 year-old female who was noted to have NA 125 - low. Patient noted to have confusion. She has ileostomy that puts out a lot and patient has been emptying it herself.   PAST MEDICAL HISTORY : Reviewed.  No changes.  CURRENT MEDICATIONS: Reviewed per [facility]  REVIEW OF SYSTEMS:  GENERAL: no change in appetite, no fatigue, no weight changes, no fever, chills or weakness RESPIRATORY: no cough, SOB, DOE,, wheezing, hemoptysis CARDIAC: no chest pain, edema or palpitations GI: no abdominal pain, diarrhea, constipation, heart burn, nausea or vomiting  PHYSICAL EXAMINATION  VS:  T96.8        P92       RR16       BP122/62             WT121.8 (Lb)  GENERAL: no acute distress, normal body habitus EYES: conjunctivae normal, sclerae normal, normal eye lids NECK: supple, trachea midline, no neck masses, no thyroid tenderness, no thyromegaly LYMPHATICS: no LAN in the neck, no supraclavicular LAN RESPIRATORY: breathing is even & unlabored, BS CTAB CARDIAC: RRR, no murmur,no extra heart sounds, no edema GI: abdomen soft, normal BS, no masses,  no hepatomegaly, no splenomegaly, +ileostomy  PSYCHIATRIC: the patient is alert & oriented to person, affect & behavior appropriate  LABS/RADIOLOGY: 02/02/13 sodium 125 potassium 5.3 glucose 88 BUN 34 creatinine 0.9 calcium 10.2  01/30/13 sodium 128 potassium 4 point the glucose 83 BUN 34 creatinine 0.7 calcium 9.8 01/19/13 sodium 139 potassium 4.3 glucose 78 BUN 22 creatinine 0.7 calcium 10.3 01/10/13 sodium 1:30 potassium 4.4 glucose 110 BUN 15 creatinine 0.6 calcium 10.4 01/04/13 sodium 1:30 potassium 4.8 glucose 102 BUN 17 creatinine 0.5 calcium 9.6 WBC 6.6 hemoglobin 12.3  hematocrit 38.1   ASSESSMENT/PLAN:  Hyponatremia - start 0.9NS @ 70cc/H X 1L; BMP in AM  CPT CODE: 91694

## 2013-02-21 NOTE — Progress Notes (Signed)
Patient ID: Tamara [name], female   DOB: 1929/12/26, 80 y.o.   MRN: [id number]          PROGRESS NOTE  DATE: 02/20/13  FACILITY:  [facility]  LEVEL OF CARE: SNF (31)  Acute Visit  CHIEF COMPLAINT:  Discharge Notes  HISTORY OF PRESENT ILLNESS: This is an 80 year-old female who is for discharge home with Home health PT, OT and Nursing/wound ostomy nurse evaluation. DME: Rolling walker and 3-in-1 commode. She has been admitted to [facility] on 01/01/13 from [facility] with principal problem of GI Bleed secondary to to distal colonic ischemic likely secondary to transverse colon  Adenocarcinoma. GI Bleed now resolved. She had a recent Resection and anastomosis. Noted midline surgical wound to have erythema with serosanguinous drainage.Patient was admitted to this facility for short-term rehabilitation after the patient's recent hospitalization.  Patient has completed SNF rehabilitation and therapy has cleared the patient for discharge.    PAST MEDICAL HISTORY : Reviewed.  No changes.  CURRENT MEDICATIONS: Reviewed per [facility]  REVIEW OF SYSTEMS:  GENERAL: no change in appetite, no fatigue, no weight changes, no fever, chills or weakness RESPIRATORY: no cough, SOB, DOE,, wheezing, hemoptysis CARDIAC: no chest pain, edema or palpitations GI: no abdominal pain, diarrhea, constipation, heart burn, nausea or vomiting  PHYSICAL EXAMINATION  VS:  T98       P87      RR18      BP130/65             WT121.8 (Lb)  GENERAL: no acute distress, normal body habitus NECK: supple, trachea midline, no neck masses, no thyroid tenderness, no thyromegaly LYMPHATICS: no LAN in the neck, no supraclavicular LAN RESPIRATORY: breathing is even & unlabored, BS CTAB CARDIAC: RRR, no murmur,no extra heart sounds, no edema GI: abdomen soft, normal BS, no masses,  no hepatomegaly, no splenomegaly, +ileostomy  PSYCHIATRIC: the patient is alert & oriented to person, affect & behavior  appropriate  LABS/RADIOLOGY: 02/16/13 NA 125  K 5.1  BUN 37  Creatinine 0.97  Glucose 104  Wbc 8.4  hgb 11  hct 32.8  tsh 0.905  LDH 135  TRIG 75   02/02/13 sodium 125 potassium 5.3 glucose 88 BUN 34 creatinine 0.9 calcium 10.2  01/30/13 sodium 128 potassium 4 point the glucose 83 BUN 34 creatinine 0.7 calcium 9.8 01/19/13 sodium 139 potassium 4.3 glucose 78 BUN 22 creatinine 0.7 calcium 10.3 01/10/13 sodium 1:30 potassium 4.4 glucose 110 BUN 15 creatinine 0.6 calcium 10.4 01/04/13 sodium 1:30 potassium 4.8 glucose 102 BUN 17 creatinine 0.5 calcium 9.6 WBC 6.6 hemoglobin 12.3 hematocrit 38.1   ASSESSMENT/PLAN:  Post-op wound infection - start Doxycycline 100 mg PO BID x 10 days  Hyponatremia - stable.  Hypertension - well-controlled; continue Lisinopril  CAD - stable  Depression - continue Celexa  Colon Cancer - S/P resection and anastomosis  I have filled out patient's discharge paperwork and written prescriptions.  Patient will receive home health PT, OT and Nursing.. DME provided:Rolling walker and 3-in-1 commode  Total discharge time: Greater than 30 minutes Discharge time involved coordination of the discharge process with social worker, nursing staff and therapy department. Medical justification for home health services/DME verified.   CPT CODE: 40981

## 2013-02-21 NOTE — Progress Notes (Signed)
This office note has been dictated.

## 2013-02-21 NOTE — Progress Notes (Signed)
CC:   Tamara [name], M.D.  DIAGNOSES: 1. 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 2. Remote history of infiltrating ductal carcinoma of the left breast. 3. History of ductal carcinoma in situ of the right breast.  HISTORY OF PRESENT ILLNESS:  Tamara [name] is a very nice 80 year old white female.  I have known her for probably 14 or 15 years.  I first saw her back about 15 years ago, with stage IIA ductal carcinoma of the left breast.  She was triple negative at that time.  She received chemotherapy with CMF for eight cycles.  She also had radiation therapy.  She then had DCIS of the right breast, this was back in May 2008.  She now presented with abdominal pain and obstruction back in November.  She subsequently underwent surgery.  She had a workup initially back on November 26, 2012, a CT scan was done which showed new bowel wall edema and pericolonic inflammatory changes.  These would suggest some ischemia.  She had a CEA level at that time which was 1.3.  She underwent a flexible sigmoidoscopy.  This was done on November 12th.  Flexible sigmoidoscopy showed a fungating nonobstructive mass in transverse colon.  Biopsies were taken.  The pathology report ([id number]) showed high-grade dysplasia.  It was obvious that she had underlying malignancy.  She then underwent a laparotomy.  She had a partial colectomy, this was done on the 13th. The pathology report ([id number]) showed a well-differentiated adenocarcinoma.  This invaded through the bowel wall and to the serosa. Margins were negative.  16 lymph nodes were all negative.  There is a single satellite nodule.  There is no evidence of metastatic disease. The tumor was felt to be stage, I think, IIIB (T4 N1c M0) adenocarcinoma.  She had incredibly hard time postop.  She required additional surgery. She was hospitalized, I think, for about six weeks or so.  She now is at rehab facility.  She  comes in, so we can see what needs to be done with respect to further therapy and management.  She does walk a little bit.  She comes in a wheelchair.  She had a lot of weight loss since I last saw her.  Her appetite is getting a little bit better.  She wants to be able to go home soon with her husband.  However, we need to make sure that she is able to maintain an independent level.  She, I think, had postop infection.  She was C. diff negative.  She has a colostomy right now.  She does follow up with the surgeons for management.  She, I think, last saw Dr. Donne Hazel, of General Surgery.  Tamara [name] has had no problems with pain wise.  She has had no cough or shortness of breath.  She has had no leg swelling.  Overall, her performance status is ECOG 2 to 3.  PAST MEDICAL HISTORY:  Remarkable for: 1. Ductal carcinoma of the left breast. 2. DCIS of the right breast. 3. Hypertension. 4. Coronary artery disease.  ALLERGIES: 1. Codeine. 2. Zofran.  MEDICATIONS:  Currently are Celexa 10 mg p.o. daily, Lomotil as needed, Zestril 5 mg p.o. daily, Ativan 0.5 mg p.o. q.8 hours p.r.n., OxyIR 5 mg p.o. q.6 hours p.r.n.  SOCIAL HISTORY:  Negative for tobacco or alcohol use.  She has no occupational exposures.  FAMILY HISTORY:  Pretty much unremarkable for colon cancer.  REVIEW OF SYSTEMS:  As stated in history  of present illness.  No additional findings are noted on a 12-system review.  PHYSICAL EXAMINATION:  General:  This is a somewhat frail-appearing white female, in no obvious distress.  Vital Signs:  Temperature of 97.6, pulse 74, respiratory rate 14, blood pressure 117/50, weight is 125 pounds.  Head and Neck:  Normocephalic, atraumatic skull.  There are no ocular or oral lesions.  She has no scleral icterus.  There is some slight temporal muscle wasting.  No adenopathy noted in the neck. Lungs:  Clear bilaterally, slight decrease at the bases.  Cardiac: Regular rate  and rhythm with a normal S1, S2.  There are no murmurs, rubs, or bruits.  Abdomen:  Soft.  She has laparotomy scars that are healed.  She has a colostomy in the, I think, right lower quadrant. There is no fluid wave.  There is no palpable hepatosplenomegaly.  Back: No tenderness over the spine, ribs, or hips.  Extremities:  Some trace edema in her legs.  Muscle strength is 4/5 bilaterally.  She had some slight decreased range of motion of her joints.  Neurologic:  No focal neurological deficits.  Skin:  No rashes, ecchymoses, or petechiae.  LABORATORY STUDIES:  White cell count 8.4, hemoglobin 11, hematocrit 32.3, platelet count 136.  BUN is 4.2.  Sodium 125, potassium 5.1, calcium 9.6.  Creatinine is 0.97.  IMPRESSION:  Tamara [name] is an 80 year old white female.  She has a new diagnosis stage IIIB colon cancer in the transverse colon.  She had 16 lymph nodes that were negative.  She did have a satellite nodule that was positive.  In ab "ideal world," we will give her adjuvant chemotherapy.  However, her performance status is not that great.  Her surgery now was almost three months ago.  I just do not see that she would derive any benefit from adjuvant chemotherapy.  I think even adjuvant Xeloda would be hard for her to manage.  I talked to her and her husband at length.  I told him what is going on. I told him that there is probably a 50% chance of this cancer coming back.  I told him that I just do not think that she was strong enough to take systemic chemotherapy.  I feel that systemic chemotherapy would probably make her worse and could certainly adversely impact her quality of life.  Tamara [name] really wants to get feeling better.  She wants to be able to be more active.  [place] and she really wants to be able to get back to her house in [place].  We clearly will have to follow her along closely.  I suspect that she probably will need another  CAT scan for followup.  We will try to do scans the same day that we see her.  I will plan to get her back to see [place] probably in about two months.  We will get scans on her at that point in time.  I just feel bad for Tamara [name].  She has been so healthy.  She really took a vague "hit" with this new cancer and surgery.    ______________________________ Volanda Napoleon, M.D. PRE/MEDQ  D:  02/21/2013  T:  02/21/2013  Job:  [id number]

## 2013-02-24 ENCOUNTER — Other Ambulatory Visit: Payer: Self-pay

## 2013-02-24 DIAGNOSIS — R509 Fever, unspecified: Secondary | ICD-10-CM | POA: Diagnosis not present

## 2013-02-24 LAB — BASIC METABOLIC PANEL
Anion Gap: 5 — ABNORMAL LOW (ref 7–16)
BUN: 30 mg/dL — AB (ref 7–18)
CHLORIDE: 103 mmol/L (ref 98–107)
CREATININE: 0.85 mg/dL (ref 0.60–1.30)
Calcium, Total: 10 mg/dL (ref 8.5–10.1)
Co2: 20 mmol/L — ABNORMAL LOW (ref 21–32)
EGFR (African American): >60
Glucose: 88 mg/dL (ref 65–99)
Osmolality: 263 (ref 275–301)
POTASSIUM: 5.5 mmol/L — AB (ref 3.5–5.1)
Sodium: 128 mmol/L — ABNORMAL LOW (ref 136–145)

## 2013-02-25 ENCOUNTER — Other Ambulatory Visit: Payer: Self-pay

## 2013-02-25 DIAGNOSIS — K922 Gastrointestinal hemorrhage, unspecified: Secondary | ICD-10-CM | POA: Diagnosis not present

## 2013-02-25 DIAGNOSIS — F411 Generalized anxiety disorder: Secondary | ICD-10-CM | POA: Diagnosis not present

## 2013-02-25 DIAGNOSIS — F329 Major depressive disorder, single episode, unspecified: Secondary | ICD-10-CM | POA: Diagnosis not present

## 2013-02-25 DIAGNOSIS — K5289 Other specified noninfective gastroenteritis and colitis: Secondary | ICD-10-CM | POA: Diagnosis not present

## 2013-02-25 DIAGNOSIS — R262 Difficulty in walking, not elsewhere classified: Secondary | ICD-10-CM | POA: Diagnosis not present

## 2013-02-25 DIAGNOSIS — E871 Hypo-osmolality and hyponatremia: Secondary | ICD-10-CM | POA: Diagnosis not present

## 2013-02-25 DIAGNOSIS — Z932 Ileostomy status: Secondary | ICD-10-CM | POA: Diagnosis not present

## 2013-02-25 DIAGNOSIS — M6281 Muscle weakness (generalized): Secondary | ICD-10-CM | POA: Diagnosis not present

## 2013-02-25 DIAGNOSIS — R1312 Dysphagia, oropharyngeal phase: Secondary | ICD-10-CM | POA: Diagnosis not present

## 2013-02-25 DIAGNOSIS — E878 Other disorders of electrolyte and fluid balance, not elsewhere classified: Secondary | ICD-10-CM | POA: Diagnosis not present

## 2013-02-25 DIAGNOSIS — K55059 Acute (reversible) ischemia of intestine, part and extent unspecified: Secondary | ICD-10-CM | POA: Diagnosis not present

## 2013-02-25 DIAGNOSIS — Z950 Presence of cardiac pacemaker: Secondary | ICD-10-CM | POA: Diagnosis not present

## 2013-02-25 DIAGNOSIS — D72829 Elevated white blood cell count, unspecified: Secondary | ICD-10-CM | POA: Diagnosis not present

## 2013-02-25 DIAGNOSIS — C189 Malignant neoplasm of colon, unspecified: Secondary | ICD-10-CM | POA: Diagnosis not present

## 2013-02-25 DIAGNOSIS — I10 Essential (primary) hypertension: Secondary | ICD-10-CM | POA: Diagnosis not present

## 2013-02-25 LAB — BASIC METABOLIC PANEL
Anion Gap: 3 — ABNORMAL LOW (ref 7–16)
BUN: 32 mg/dL — AB (ref 7–18)
CALCIUM: 10.1 mg/dL (ref 8.5–10.1)
CO2: 25 mmol/L (ref 21–32)
CREATININE: 1.07 mg/dL (ref 0.60–1.30)
Chloride: 102 mmol/L (ref 98–107)
EGFR (African American): 56 — ABNORMAL LOW
EGFR (Non-African Amer.): 49 — ABNORMAL LOW
Glucose: 90 mg/dL (ref 65–99)
Osmolality: 267 (ref 275–301)
Potassium: 5.6 mmol/L — ABNORMAL HIGH (ref 3.5–5.1)
Sodium: 130 mmol/L — ABNORMAL LOW (ref 136–145)

## 2013-02-27 DIAGNOSIS — IMO0002 Reserved for concepts with insufficient information to code with codable children: Secondary | ICD-10-CM

## 2013-02-27 DIAGNOSIS — Z483 Aftercare following surgery for neoplasm: Secondary | ICD-10-CM | POA: Diagnosis not present

## 2013-02-27 DIAGNOSIS — I2510 Atherosclerotic heart disease of native coronary artery without angina pectoris: Secondary | ICD-10-CM | POA: Diagnosis not present

## 2013-02-27 DIAGNOSIS — Z433 Encounter for attention to colostomy: Secondary | ICD-10-CM | POA: Diagnosis not present

## 2013-02-27 DIAGNOSIS — C50919 Malignant neoplasm of unspecified site of unspecified female breast: Secondary | ICD-10-CM | POA: Diagnosis not present

## 2013-02-27 DIAGNOSIS — Z4801 Encounter for change or removal of surgical wound dressing: Secondary | ICD-10-CM | POA: Diagnosis not present

## 2013-02-27 DIAGNOSIS — I10 Essential (primary) hypertension: Secondary | ICD-10-CM | POA: Diagnosis not present

## 2013-02-27 DIAGNOSIS — C189 Malignant neoplasm of colon, unspecified: Secondary | ICD-10-CM | POA: Diagnosis not present

## 2013-03-01 ENCOUNTER — Telehealth: Payer: Self-pay | Admitting: Hematology & Oncology

## 2013-03-01 DIAGNOSIS — C189 Malignant neoplasm of colon, unspecified: Secondary | ICD-10-CM | POA: Diagnosis not present

## 2013-03-01 DIAGNOSIS — Z483 Aftercare following surgery for neoplasm: Secondary | ICD-10-CM | POA: Diagnosis not present

## 2013-03-01 DIAGNOSIS — Z433 Encounter for attention to colostomy: Secondary | ICD-10-CM | POA: Diagnosis not present

## 2013-03-01 DIAGNOSIS — IMO0002 Reserved for concepts with insufficient information to code with codable children: Secondary | ICD-10-CM | POA: Diagnosis not present

## 2013-03-01 DIAGNOSIS — C50919 Malignant neoplasm of unspecified site of unspecified female breast: Secondary | ICD-10-CM | POA: Diagnosis not present

## 2013-03-01 DIAGNOSIS — I10 Essential (primary) hypertension: Secondary | ICD-10-CM | POA: Diagnosis not present

## 2013-03-01 NOTE — Telephone Encounter (Signed)
Called [facility] to give appointment information for pt. [phone]) Left message for Janett Billow to call.

## 2013-03-01 NOTE — Telephone Encounter (Signed)
Pt aware of 3-25 appointments to be npo 4 hrs and to pick up and drink contrast at 8 am and 9am.

## 2013-03-02 DIAGNOSIS — Z433 Encounter for attention to colostomy: Secondary | ICD-10-CM | POA: Diagnosis not present

## 2013-03-02 DIAGNOSIS — I10 Essential (primary) hypertension: Secondary | ICD-10-CM | POA: Diagnosis not present

## 2013-03-02 DIAGNOSIS — C189 Malignant neoplasm of colon, unspecified: Secondary | ICD-10-CM | POA: Diagnosis not present

## 2013-03-02 DIAGNOSIS — IMO0002 Reserved for concepts with insufficient information to code with codable children: Secondary | ICD-10-CM | POA: Diagnosis not present

## 2013-03-02 DIAGNOSIS — C50919 Malignant neoplasm of unspecified site of unspecified female breast: Secondary | ICD-10-CM | POA: Diagnosis not present

## 2013-03-02 DIAGNOSIS — Z483 Aftercare following surgery for neoplasm: Secondary | ICD-10-CM | POA: Diagnosis not present

## 2013-03-05 ENCOUNTER — Inpatient Hospital Stay (HOSPITAL_COMMUNITY)
Admission: EM | Admit: 2013-03-05 | Discharge: 2013-03-08 | DRG: 682 | Disposition: A | Discharge status: Nursing Home (Includes ICF) | Payer: Medicare Other | Attending: Internal Medicine | Admitting: Internal Medicine

## 2013-03-05 ENCOUNTER — Encounter (HOSPITAL_COMMUNITY): Payer: Self-pay | Admitting: Emergency Medicine

## 2013-03-05 ENCOUNTER — Emergency Department (HOSPITAL_COMMUNITY): Payer: Medicare Other

## 2013-03-05 DIAGNOSIS — E785 Hyperlipidemia, unspecified: Secondary | ICD-10-CM | POA: Diagnosis present

## 2013-03-05 DIAGNOSIS — R5383 Other fatigue: Secondary | ICD-10-CM | POA: Diagnosis not present

## 2013-03-05 DIAGNOSIS — E871 Hypo-osmolality and hyponatremia: Secondary | ICD-10-CM | POA: Diagnosis present

## 2013-03-05 DIAGNOSIS — N289 Disorder of kidney and ureter, unspecified: Secondary | ICD-10-CM | POA: Diagnosis not present

## 2013-03-05 DIAGNOSIS — C184 Malignant neoplasm of transverse colon: Secondary | ICD-10-CM | POA: Diagnosis present

## 2013-03-05 DIAGNOSIS — E875 Hyperkalemia: Secondary | ICD-10-CM | POA: Diagnosis present

## 2013-03-05 DIAGNOSIS — R404 Transient alteration of awareness: Secondary | ICD-10-CM | POA: Diagnosis not present

## 2013-03-05 DIAGNOSIS — N179 Acute kidney failure, unspecified: Secondary | ICD-10-CM | POA: Diagnosis not present

## 2013-03-05 DIAGNOSIS — Z820 Family history of epilepsy and other diseases of the nervous system: Secondary | ICD-10-CM | POA: Diagnosis not present

## 2013-03-05 DIAGNOSIS — N390 Urinary tract infection, site not specified: Secondary | ICD-10-CM | POA: Diagnosis present

## 2013-03-05 DIAGNOSIS — IMO0002 Reserved for concepts with insufficient information to code with codable children: Secondary | ICD-10-CM

## 2013-03-05 DIAGNOSIS — C189 Malignant neoplasm of colon, unspecified: Secondary | ICD-10-CM | POA: Diagnosis present

## 2013-03-05 DIAGNOSIS — I2510 Atherosclerotic heart disease of native coronary artery without angina pectoris: Secondary | ICD-10-CM | POA: Diagnosis present

## 2013-03-05 DIAGNOSIS — Z933 Colostomy status: Secondary | ICD-10-CM

## 2013-03-05 DIAGNOSIS — D72829 Elevated white blood cell count, unspecified: Secondary | ICD-10-CM | POA: Diagnosis not present

## 2013-03-05 DIAGNOSIS — E876 Hypokalemia: Secondary | ICD-10-CM | POA: Diagnosis not present

## 2013-03-05 DIAGNOSIS — E46 Unspecified protein-calorie malnutrition: Secondary | ICD-10-CM | POA: Insufficient documentation

## 2013-03-05 DIAGNOSIS — Z8249 Family history of ischemic heart disease and other diseases of the circulatory system: Secondary | ICD-10-CM | POA: Diagnosis not present

## 2013-03-05 DIAGNOSIS — R630 Anorexia: Secondary | ICD-10-CM | POA: Diagnosis not present

## 2013-03-05 DIAGNOSIS — E43 Unspecified severe protein-calorie malnutrition: Secondary | ICD-10-CM | POA: Diagnosis present

## 2013-03-05 DIAGNOSIS — E872 Acidosis, unspecified: Secondary | ICD-10-CM | POA: Diagnosis present

## 2013-03-05 DIAGNOSIS — E860 Dehydration: Secondary | ICD-10-CM | POA: Diagnosis present

## 2013-03-05 DIAGNOSIS — Z9049 Acquired absence of other specified parts of digestive tract: Secondary | ICD-10-CM | POA: Diagnosis not present

## 2013-03-05 DIAGNOSIS — I10 Essential (primary) hypertension: Secondary | ICD-10-CM | POA: Diagnosis not present

## 2013-03-05 DIAGNOSIS — Z950 Presence of cardiac pacemaker: Secondary | ICD-10-CM

## 2013-03-05 DIAGNOSIS — C50919 Malignant neoplasm of unspecified site of unspecified female breast: Secondary | ICD-10-CM | POA: Diagnosis not present

## 2013-03-05 DIAGNOSIS — Z85038 Personal history of other malignant neoplasm of large intestine: Secondary | ICD-10-CM | POA: Diagnosis not present

## 2013-03-05 DIAGNOSIS — B999 Unspecified infectious disease: Secondary | ICD-10-CM | POA: Diagnosis not present

## 2013-03-05 DIAGNOSIS — D0590 Unspecified type of carcinoma in situ of unspecified breast: Secondary | ICD-10-CM | POA: Diagnosis present

## 2013-03-05 DIAGNOSIS — R5381 Other malaise: Secondary | ICD-10-CM | POA: Diagnosis not present

## 2013-03-05 DIAGNOSIS — Z79899 Other long term (current) drug therapy: Secondary | ICD-10-CM

## 2013-03-05 DIAGNOSIS — Z433 Encounter for attention to colostomy: Secondary | ICD-10-CM | POA: Diagnosis not present

## 2013-03-05 DIAGNOSIS — Z483 Aftercare following surgery for neoplasm: Secondary | ICD-10-CM | POA: Diagnosis not present

## 2013-03-05 DIAGNOSIS — R110 Nausea: Secondary | ICD-10-CM | POA: Diagnosis not present

## 2013-03-05 LAB — COMPREHENSIVE METABOLIC PANEL
ALT: 26 U/L (ref 0–35)
AST: 24 U/L (ref 0–37)
Albumin: 4.1 g/dL (ref 3.5–5.2)
Alkaline Phosphatase: 124 U/L — ABNORMAL HIGH (ref 39–117)
BUN: 69 mg/dL — ABNORMAL HIGH (ref 6–23)
CO2: 12 mEq/L — ABNORMAL LOW (ref 19–32)
CREATININE: 1.46 mg/dL — AB (ref 0.50–1.10)
Calcium: 10.9 mg/dL — ABNORMAL HIGH (ref 8.4–10.5)
Chloride: 94 mEq/L — ABNORMAL LOW (ref 96–112)
GFR calc non Af Amer: 33 mL/min — ABNORMAL LOW (ref >90)
GFR, EST AFRICAN AMERICAN: 38 mL/min — AB (ref >90)
Glucose, Bld: 111 mg/dL — ABNORMAL HIGH (ref 70–99)
Potassium: 6.6 mEq/L (ref 3.7–5.3)
SODIUM: 122 meq/L — AB (ref 137–147)
TOTAL PROTEIN: 8.4 g/dL — AB (ref 6.0–8.3)
Total Bilirubin: 0.5 mg/dL (ref 0.3–1.2)

## 2013-03-05 LAB — CBC WITH DIFFERENTIAL/PLATELET
BASOS ABS: 0.1 10*3/uL (ref 0.0–0.1)
Basophils Relative: 1 % (ref 0–1)
EOS ABS: 0.2 10*3/uL (ref 0.0–0.7)
EOS PCT: 2 % (ref 0–5)
HEMATOCRIT: 36.3 % (ref 36.0–46.0)
Hemoglobin: 13 g/dL (ref 12.0–15.0)
Lymphocytes Relative: 23 % (ref 12–46)
Lymphs Abs: 2.5 10*3/uL (ref 0.7–4.0)
MCH: 31.5 pg (ref 26.0–34.0)
MCHC: 35.8 g/dL (ref 30.0–36.0)
MCV: 87.9 fL (ref 78.0–100.0)
MONO ABS: 0.8 10*3/uL (ref 0.1–1.0)
Monocytes Relative: 7 % (ref 3–12)
Neutro Abs: 7.2 10*3/uL (ref 1.7–7.7)
Neutrophils Relative %: 67 % (ref 43–77)
PLATELETS: 210 10*3/uL (ref 150–400)
RBC: 4.13 MIL/uL (ref 3.87–5.11)
RDW: 16.8 % — ABNORMAL HIGH (ref 11.5–15.5)
WBC: 10.7 10*3/uL — ABNORMAL HIGH (ref 4.0–10.5)

## 2013-03-05 LAB — URINALYSIS W MICROSCOPIC + REFLEX CULTURE
Bilirubin Urine: NEGATIVE
GLUCOSE, UA: NEGATIVE mg/dL
Hgb urine dipstick: NEGATIVE
Ketones, ur: NEGATIVE mg/dL
Nitrite: NEGATIVE
PROTEIN: NEGATIVE mg/dL
Specific Gravity, Urine: 1.02 (ref 1.005–1.030)
UROBILINOGEN UA: 0.2 mg/dL (ref 0.0–1.0)
pH: 5.5 (ref 5.0–8.0)

## 2013-03-05 LAB — POTASSIUM
Potassium: 6.5 mEq/L (ref 3.7–5.3)
Potassium: 6.3 mEq/L — ABNORMAL HIGH (ref 3.7–5.3)

## 2013-03-05 LAB — LACTIC ACID, PLASMA: Lactic Acid, Venous: 1.2 mmol/L (ref 0.5–2.2)

## 2013-03-05 LAB — TROPONIN I: Troponin I: <0.3 ng/mL (ref <0.30)

## 2013-03-05 LAB — LIPASE, BLOOD: Lipase: 98 U/L — ABNORMAL HIGH (ref 11–59)

## 2013-03-05 MED ORDER — INSULIN ASPART 100 UNIT/ML ~~LOC~~ SOLN
3.0000 [IU] | Freq: Once | SUBCUTANEOUS | Status: AC
  Administered 2013-03-05: 3 [IU] via INTRAVENOUS
  Filled 2013-03-05: qty 1

## 2013-03-05 MED ORDER — SODIUM CHLORIDE 0.9 % IV SOLN
1.0000 g | Freq: Once | INTRAVENOUS | Status: AC
  Administered 2013-03-05: 1 g via INTRAVENOUS
  Filled 2013-03-05: qty 10

## 2013-03-05 MED ORDER — LORAZEPAM 0.5 MG PO TABS
0.5000 mg | ORAL_TABLET | Freq: Three times a day (TID) | ORAL | Status: DC | PRN
  Administered 2013-03-08: 0.5 mg via ORAL
  Filled 2013-03-05: qty 1

## 2013-03-05 MED ORDER — DIPHENOXYLATE-ATROPINE 2.5-0.025 MG PO TABS: 1.0000 | ORAL_TABLET | Freq: Four times a day (QID) | ORAL | Status: DC | PRN

## 2013-03-05 MED ORDER — METOCLOPRAMIDE HCL 5 MG/ML IJ SOLN
5.0000 mg | Freq: Four times a day (QID) | INTRAMUSCULAR | Status: DC | PRN
  Administered 2013-03-05: 5 mg via INTRAVENOUS
  Filled 2013-03-05: qty 2

## 2013-03-05 MED ORDER — SIMETHICONE 80 MG PO CHEW
80.0000 mg | CHEWABLE_TABLET | Freq: Four times a day (QID) | ORAL | Status: DC | PRN
  Filled 2013-03-05: qty 1

## 2013-03-05 MED ORDER — SODIUM BICARBONATE 8.4 % IV SOLN
INTRAVENOUS | Status: AC
  Administered 2013-03-05 – 2013-03-07 (×4): via INTRAVENOUS
  Filled 2013-03-05 (×7): qty 1000

## 2013-03-05 MED ORDER — FLORA-Q PO CAPS
1.0000 | ORAL_CAPSULE | Freq: Every day | ORAL | Status: DC
  Administered 2013-03-06 – 2013-03-08 (×3): 1 via ORAL
  Filled 2013-03-05 (×3): qty 1

## 2013-03-05 MED ORDER — SODIUM CHLORIDE 0.9 % IV BOLUS (SEPSIS)
1000.0000 mL | INTRAVENOUS | Status: AC
  Administered 2013-03-05: 1000 mL via INTRAVENOUS

## 2013-03-05 MED ORDER — ONDANSETRON HCL 4 MG/2ML IJ SOLN
4.0000 mg | Freq: Once | INTRAMUSCULAR | Status: DC
  Filled 2013-03-05: qty 2

## 2013-03-05 MED ORDER — SODIUM CHLORIDE 0.9 % IV SOLN
INTRAVENOUS | Status: AC
  Administered 2013-03-05: via INTRAVENOUS

## 2013-03-05 MED ORDER — CITALOPRAM HYDROBROMIDE 10 MG PO TABS
10.0000 mg | ORAL_TABLET | Freq: Every day | ORAL | Status: DC
  Administered 2013-03-06 – 2013-03-08 (×3): 10 mg via ORAL
  Filled 2013-03-05 (×3): qty 1

## 2013-03-05 MED ORDER — SUPER B COMPLEX PO TABS: 1.0000 | ORAL_TABLET | Freq: Every day | ORAL | Status: DC

## 2013-03-05 MED ORDER — DEXTROSE 5 % IV SOLN
1.0000 g | INTRAVENOUS | Status: DC
  Administered 2013-03-05 – 2013-03-07 (×3): 1 g via INTRAVENOUS
  Filled 2013-03-05 (×4): qty 10

## 2013-03-05 MED ORDER — SODIUM BICARBONATE 8.4 % IV SOLN
50.0000 meq | Freq: Once | INTRAVENOUS | Status: AC
  Administered 2013-03-05: 50 meq via INTRAVENOUS
  Filled 2013-03-05: qty 50

## 2013-03-05 MED ORDER — VITAMIN D 1000 UNITS PO TABS
2000.0000 [IU] | ORAL_TABLET | Freq: Every day | ORAL | Status: DC
  Administered 2013-03-06 – 2013-03-08 (×3): 2000 [IU] via ORAL
  Filled 2013-03-05 (×3): qty 2

## 2013-03-05 MED ORDER — METOCLOPRAMIDE HCL 5 MG/ML IJ SOLN: 5.0000 mg | Freq: Once | INTRAMUSCULAR | Status: DC

## 2013-03-05 MED ORDER — ENOXAPARIN SODIUM 30 MG/0.3ML ~~LOC~~ SOLN
30.0000 mg | SUBCUTANEOUS | Status: DC
  Administered 2013-03-05 – 2013-03-06 (×2): 30 mg via SUBCUTANEOUS
  Filled 2013-03-05 (×3): qty 0.3

## 2013-03-05 MED ORDER — SODIUM POLYSTYRENE SULFONATE 15 GM/60ML PO SUSP
30.0000 g | Freq: Once | ORAL | Status: AC
  Administered 2013-03-05: 30 g via ORAL
  Filled 2013-03-05: qty 120

## 2013-03-05 MED ORDER — ACETAMINOPHEN 325 MG PO TABS: 650.0000 mg | ORAL_TABLET | ORAL | Status: DC | PRN

## 2013-03-05 MED ORDER — SODIUM CHLORIDE 0.9 % IJ SOLN
3.0000 mL | Freq: Two times a day (BID) | INTRAMUSCULAR | Status: DC
  Administered 2013-03-08: 3 mL via INTRAVENOUS

## 2013-03-05 MED ORDER — ACETAMINOPHEN 650 MG RE SUPP: 650.0000 mg | Freq: Four times a day (QID) | RECTAL | Status: DC | PRN

## 2013-03-05 MED ORDER — DEXTROSE 50 % IV SOLN
50.0000 mL | Freq: Once | INTRAVENOUS | Status: AC
  Administered 2013-03-05: 50 mL via INTRAVENOUS
  Filled 2013-03-05: qty 50

## 2013-03-05 MED ORDER — ACETAMINOPHEN 325 MG PO TABS: 650.0000 mg | ORAL_TABLET | Freq: Four times a day (QID) | ORAL | Status: DC | PRN

## 2013-03-05 MED ORDER — VITAMIN B-12 100 MCG PO TABS
100.0000 ug | ORAL_TABLET | Freq: Every day | ORAL | Status: DC
  Administered 2013-03-06 – 2013-03-08 (×3): 100 ug via ORAL
  Filled 2013-03-05 (×3): qty 1

## 2013-03-05 MED ORDER — OXYCODONE HCL 5 MG PO TABS: 5.0000 mg | ORAL_TABLET | Freq: Four times a day (QID) | ORAL | Status: DC | PRN

## 2013-03-05 MED ORDER — ADULT MULTIVITAMIN W/MINERALS CH
1.0000 | ORAL_TABLET | Freq: Every day | ORAL | Status: DC
  Administered 2013-03-06 – 2013-03-08 (×3): 1 via ORAL
  Filled 2013-03-05 (×3): qty 1

## 2013-03-05 MED ORDER — ENSURE COMPLETE PO LIQD
237.0000 mL | Freq: Three times a day (TID) | ORAL | Status: DC
  Administered 2013-03-06: 237 mL via ORAL

## 2013-03-05 NOTE — ED Notes (Signed)
Lab called to report potassium of 6.5 with moderate hemolysis-Jakeema RN made aware and will redraw

## 2013-03-05 NOTE — ED Notes (Signed)
Patient states can void, but unable to at this time.

## 2013-03-05 NOTE — ED Notes (Signed)
md made aware of critical value 

## 2013-03-05 NOTE — ED Notes (Signed)
Per ems pt is from home, d/c from rehab facility 1 week ago, there for rehab after colon cancer surgery. Pt has not been able to eat or sleep for last 2 days. Reports nausea, no vomiting.

## 2013-03-05 NOTE — ED Notes (Signed)
Pt alert and oriented x4. Respirations even and unlabored, bilateral symmetrical rise and fall of chest. Skin warm and dry. In no acute distress. Denies needs.   

## 2013-03-05 NOTE — H&P (Addendum)
[facility] History and Physical  Tamara [name] [id number] DOB: 27-Feb-1929 DOA: 03/05/2013  Referring physician: ED PCP: Dr Herbie [place] gates, MD  Primary oncologist: Dr. Marin Olp  Chief Complaint:  Weakness with nausea and poor appetite since one week  HPI:  80 year old female with history of hypertension, CAD, history of left breast cancer, syncope with pacemaker implant in 2014, stage IIIB adenocarcinoma of the transverse colon status post laparotomy with partial colectomy with hospital course prolonged to 2 GI bleed secondary to colonic ischemia with postoperative ileus requiring repeat surgery with with formation of an ileostomy and was sent to skilled nursing facility after a prolonged hospital stay in the first week of December 2014. Patient also had severe protein calorie malnutrition and deconditioning. Patient was discharged home from skilled nursing facility one week back. Since she went back home patient has had increased nausea with poor appetite and generalized weakness. She denies change in ileostomy output. Patient denies headache, dizziness, fever, chills,  vomiting, chest pain, palpitations, SOB, abdominal pain, bowel or urinary symptoms. Denies change in weight or appetite.  Course in the ED Patient had normal vitals. She clinically appeared very dehydrated. Blood work done showed a mild leukocytosis with WBC of 10.7, normal hemoglobin/hematocrit and platelets. She was hyponatremic with sodium of 122, hypokalemic with potassium of 6.6, chloride of 94, bicarbonate 12, with anion gap metabolic acidosis of 16. She also had acute kidney injury with BUN of 69 and creatinine of 1.46. Calcium was 10.9. Glucose was 111. EKG was done which was unremarkable. Initial troponin was negative. Repeat potassium was 6.5. Chest x-ray done was unremarkable. UA done showed moderate leukocytosis and few bacteria. Patient given 1 g calcium gluconate followed by 2 units regular insulin, 1 amp of  sodium bicarbonate and 1 amp of IV dextrose for hyperlipidemia. She was given 1 L normal saline bolus and 5 mg of IV Reglan. Prior hospice consulted for admission to telemetry for metabolic acidosis with hyperkalemia.  Review of Systems:  Constitutional: Denies fever, chills, diaphoresis, appetite change and fatigue.  HEENT: Denies photophobia, eye pain, , hearing loss, ear pain, congestion, sore throat, rhinorrhea, sneezing, mouth sores, trouble swallowing, neck pain, neck stiffness and tinnitus.   Respiratory: Denies SOB, DOE, cough, chest tightness,  and wheezing.   Cardiovascular: Denies chest pain, palpitations and leg swelling.  Gastrointestinal:  nausea, denies vomiting, abdominal pain, diarrhea, constipation, blood in stool and abdominal distention.  Genitourinary: Denies dysuria, urgency, frequency, hematuria, flank pain and difficulty urinating.  Endocrine: Denies polyuria, polydipsia. Musculoskeletal: Denies myalgias, back pain, joint swelling, arthralgias and gait problem.  Skin: Denies pallor, rash and wound.  Neurological: Generalized weakness, Denies dizziness, seizures, syncope,  light-headedness, numbness and headaches.  Psychiatric/Behavioral: Denies  confusion, nervousness, sleep disturbance and agitation   Past Medical History  Diagnosis Date  . Breast cancer   . HTN (hypertension) August 2014  . Syncope and collapse August 2014    sinus pauses/ PTVDP  . Colitis   . Colon cancer 02/21/2013   Past Surgical History  Procedure Laterality Date  . Breast lumpectomy    . Cholecystectomy    . Tonsillectomy    . Pacemaker insertion  08/28/12    MDT  . Flexible sigmoidoscopy N/A 12/06/2012    Procedure: FLEXIBLE SIGMOIDOSCOPY;  Surgeon: Lear Ng, MD;  Location: [facility];  Service: Endoscopy;  Laterality: N/A;  . Laparoscopic partial colectomy N/A 12/07/2012    Procedure: LAPAROSCOPIC-ASSISTED TRANSVERSE COLECTOMY;  Surgeon: Joyice Faster. Cornett, MD;  Location:  [facility];  Service: General;  Laterality: N/A;  . Colonoscopy N/A 12/15/2012    Procedure: COLONOSCOPY;  Surgeon: Jeryl Columbia, MD;  Location: [facility] ENDOSCOPY;  Service: Endoscopy;  Laterality: N/A;  . Bowel decompression N/A 12/15/2012    Procedure: BOWEL DECOMPRESSION;  Surgeon: Jeryl Columbia, MD;  Location: [facility] ENDOSCOPY;  Service: Endoscopy;  Laterality: N/A;  . Colon resection N/A 12/22/2012    Procedure: EXPLORATORY LAPAROTOMY , RIGHT Colectomy IILEOSTOMY;  Surgeon: Rolm Bookbinder, MD;  Location: [facility];  Service: General;  Laterality: N/A;   Social History:  reports that she has never smoked. She has never used smokeless tobacco. She reports that she does not drink alcohol or use illicit drugs.  Allergies  Allergen Reactions  . Codeine Nausea And Vomiting  . Zofran [Ondansetron Hcl]     Family History  Problem Relation Age of Onset  . Alzheimer's disease Mother   . Renal Disease Father   . Coronary artery disease Brother     Prior to Admission medications   Medication Sig Start Date End Date Taking? Authorizing Provider  acetaminophen (TYLENOL) 325 MG tablet Take 2 tablets (650 mg total) by mouth every 4 (four) hours as needed. 08/30/12   Erlene Quan, PA-C  B Complex-C (SUPER B COMPLEX PO) Take by mouth every other day.    Historical Provider, MD  Cholecalciferol (VITAMIN D) 2000 UNITS tablet Take 2,000 Units by mouth daily with breakfast.    Historical Provider, MD  citalopram (CELEXA) 10 MG tablet Take 1 tablet (10 mg total) by mouth daily. 01/01/13   Josetta Huddle, MD  Cyanocobalamin (VITAMIN B-12 PO) Take by mouth every other day.    Historical Provider, MD  diphenoxylate-atropine (LOMOTIL) 2.5-0.025 MG per tablet Take one tablet by mouth once daily 01/30/13   Estill Dooms, MD  feeding supplement, ENSURE COMPLETE, (ENSURE COMPLETE) LIQD Take 237 mLs by mouth 3 (three) times daily between meals. 01/01/13   Josetta Huddle, MD  lisinopril (PRINIVIL,ZESTRIL) 10 MG tablet Take 0.5 tablets  (5 mg total) by mouth daily. 01/01/13   Josetta Huddle, MD  LORazepam (ATIVAN) 0.5 MG tablet Take 0.5 mg by mouth every 8 (eight) hours as needed for anxiety.    Historical Provider, MD  Multiple Vitamins-Minerals (ANTIOXIDANT FORMULA SG) capsule Take 1 capsule by mouth daily.    Historical Provider, MD  oxyCODONE (OXY IR/ROXICODONE) 5 MG immediate release tablet Take 5 mg by mouth every 6 (six) hours as needed for severe pain.    Historical Provider, MD  Probiotic Product (ALIGN PO) Take 1 capsule by mouth daily with breakfast.    Historical Provider, MD  simethicone (MYLICON) 80 MG chewable tablet Chew 1 tablet (80 mg total) by mouth every 6 (six) hours as needed for flatulence (1 to 2 tablets). 01/01/13   Josetta Huddle, MD     Physical Exam:  Filed Vitals:   03/05/13 1651 03/05/13 1656 03/05/13 1900 03/05/13 2100  BP:  127/67  132/61  Pulse:  75 84 85  Temp:  97.6 F (36.4 C)  97.8 F (36.6 C)  TempSrc:  Oral  Oral  Resp:  21 15 20   SpO2: 97% 100% 99% 100%    Constitutional: Vital signs reviewed.  Patient is an elderly female lying in bed in no acute distress. HEENT: no pallor, no icterus, dry oral mucosa, no cervical lymphadenopathy Cardiovascular: RRR, S1 normal, S2 normal, no MRG, has left-sided pacemaker Chest: CTAB, no wheezes, rales, or rhonchi Abdominal: An ostomy bag with clean  stoma and yellow stool in bag. No blood. Soft. Non-tender, non-distended, bowel sounds are normal,  Ext: warm, no edema Neurological: A&O x3, non focal  Labs on Admission:  Basic Metabolic Panel:  Recent Labs Lab 03/05/13 1730 03/05/13 1915  NA 122*  --   K 6.6* 6.5*  CL 94*  --   CO2 12*  --   GLUCOSE 111*  --   BUN 69*  --   CREATININE 1.46*  --   CALCIUM 10.9*  --    Liver Function Tests:  Recent Labs Lab 03/05/13 1730  AST 24  ALT 26  ALKPHOS 124*  BILITOT 0.5  PROT 8.4*  ALBUMIN 4.1    Recent Labs Lab 03/05/13 1730  LIPASE 98*   No results found for this basename:  AMMONIA,  in the last 168 hours CBC:  Recent Labs Lab 03/05/13 1730  WBC 10.7*  NEUTROABS 7.2  HGB 13.0  HCT 36.3  MCV 87.9  PLT 210   Cardiac Enzymes:  Recent Labs Lab 03/05/13 1730  TROPONINI <0.30   BNP: No components found with this basename: POCBNP,  CBG: No results found for this basename: GLUCAP,  in the last 168 hours  Radiological Exams on Admission: Dg Chest 2 View  03/05/2013   CLINICAL DATA:  Nausea, loss of appetite, history of colon cancer and hypertension.  EXAM: CHEST  2 VIEW  COMPARISON:  December 11, 2012  FINDINGS: The heart size and mediastinal contours are stable. Cardiac pacemaker is unchanged. There is no focal infiltrate, pulmonary edema, or pleural effusion. Vessel on end versus calcified granuloma is identified in the medial right lung base unchanged compared to prior exam. The visualized skeletal structures are stable.  IMPRESSION: No active cardiopulmonary disease.   Electronically Signed   By: Abelardo Diesel M.D.   On: 03/05/2013 18:26    EKG: normal sinus rhythm at 72, no ST-T changes  Assessment/Plan  Principal Problem: Metabolic acidosis Likely secondary to severe dehydration with poor appetite. Admit to telemetry Patient given one unit IV normal saline bolus in the ED. I would place her on D5 with 3 amps of bicarbonate. Monitor BMET  in the morning. Check lactate level. -I would place her on clear liquids.  nutrition consult in the morning.  Active Problems: Hyperkalemia No clear etiology except for being on low dose lisinopril. Patient given 1 g calcium gluconate, regular insulin with D50 and one amp of sodium bicarbonate in the ED. Will order 30 gm of kayexalate. No EKG changes noted. Recheck potassium level in 4 hours. Monitor on telemetry.  Nausea with dehydration and generalized weakness Continue when necessary Reglan and IV hydration. We'll place her on clears. Denies any dysphagia. nutrition consultation the morning. PT  eval.  Adenocarcinoma of transverse colon Status post colectomy. Holding on chemotherapy due to weakness. Follows with Dr Marin Olp who needs to be notified in am.    Hyponatremia Secondary to dehydration. Monitor with IV fluids     Acute kidney injury Likely prerenal secondary to dehydration. Monitor with IV hydration.  UTI We'll place her on empiric Rocephin. Check urine culture    Essential hypertension, benign BP currently stable. Hold lisinopril given hyperkalemia    Diet: Clear liquids  DVT prophylaxis: sq lovenox   Code Status: full code Family Communication: Called her niece and notified as husband was asleep Disposition Plan: Home once improved  Patient will  be seen by Dr. Inda Merlin in the morning. Left a voice message.  Louellen Molder [facility]  Pager [phone]  Total time spent on admission :70 minutes  If 7PM-7AM, please contact night-coverage [email or web address] Password [email or web address] 03/05/2013, 9:10 PM

## 2013-03-05 NOTE — ED Provider Notes (Signed)
CSN: [id number]     Arrival date & time 03/05/13  1646 History   First MD Initiated Contact with Patient 03/05/13 1654     Chief Complaint  Patient presents with  . Weakness     (Consider location/radiation/quality/duration/timing/severity/associated sxs/prior Treatment) Patient is a 80 y.o. female presenting with weakness. The history is provided by the patient.  Weakness This is a new problem. The current episode started more than 2 days ago. The problem occurs constantly. The problem has not changed since onset.Pertinent negatives include no chest pain, no abdominal pain, no headaches and no shortness of breath. Nothing aggravates the symptoms. Nothing relieves the symptoms. She has tried nothing for the symptoms. The treatment provided no relief.    Past Medical History  Diagnosis Date  . Breast cancer   . HTN (hypertension) August 2014  . Syncope and collapse August 2014    sinus pauses/ PTVDP  . Colitis   . Colon cancer 02/21/2013   Past Surgical History  Procedure Laterality Date  . Breast lumpectomy    . Cholecystectomy    . Tonsillectomy    . Pacemaker insertion  08/28/12    MDT  . Flexible sigmoidoscopy N/A 12/06/2012    Procedure: FLEXIBLE SIGMOIDOSCOPY;  Surgeon: Lear Ng, MD;  Location: [place];  Service: Endoscopy;  Laterality: N/A;  . Laparoscopic partial colectomy N/A 12/07/2012    Procedure: LAPAROSCOPIC-ASSISTED TRANSVERSE COLECTOMY;  Surgeon: Joyice Faster. Cornett, MD;  Location: [place];  Service: General;  Laterality: N/A;  . Colonoscopy N/A 12/15/2012    Procedure: COLONOSCOPY;  Surgeon: Jeryl Columbia, MD;  Location: [facility] ENDOSCOPY;  Service: Endoscopy;  Laterality: N/A;  . Bowel decompression N/A 12/15/2012    Procedure: BOWEL DECOMPRESSION;  Surgeon: Jeryl Columbia, MD;  Location: [facility] ENDOSCOPY;  Service: Endoscopy;  Laterality: N/A;  . Colon resection N/A 12/22/2012    Procedure: EXPLORATORY LAPAROTOMY , RIGHT Colectomy IILEOSTOMY;  Surgeon: Rolm Bookbinder, MD;  Location: [facility] OR;  Service: General;  Laterality: N/A;   Family History  Problem Relation Age of Onset  . Alzheimer's disease Mother   . Renal Disease Father   . Coronary artery disease Brother    History  Substance Use Topics  . Smoking status: Never Smoker   . Smokeless tobacco: Never Used     Comment: never used  . Alcohol Use: No   OB History   Grav Para Term Preterm Abortions TAB SAB Ect Mult Living                 Review of Systems  Constitutional: Negative for fever and fatigue.  HENT: Negative for congestion and drooling.   Eyes: Negative for pain.  Respiratory: Negative for cough and shortness of breath.   Cardiovascular: Negative for chest pain.  Gastrointestinal: Positive for nausea. Negative for vomiting, abdominal pain and diarrhea.  Genitourinary: Negative for dysuria and hematuria.  Musculoskeletal: Negative for back pain, gait problem and neck pain.  Skin: Negative for color change.  Neurological: Positive for weakness. Negative for dizziness and headaches.  Hematological: Negative for adenopathy.  Psychiatric/Behavioral: Negative for behavioral problems.  All other systems reviewed and are negative.      Allergies  Codeine and Zofran  Home Medications   Current Outpatient Rx  Name  Route  Sig  Dispense  Refill  . acetaminophen (TYLENOL) 325 MG tablet   Oral   Take 2 tablets (650 mg total) by mouth every 4 (four) hours as needed.         Marland Kitchen  B Complex-C (SUPER B COMPLEX PO)   Oral   Take by mouth every other day.         . Cholecalciferol (VITAMIN D) 2000 UNITS tablet   Oral   Take 2,000 Units by mouth daily with breakfast.         . citalopram (CELEXA) 10 MG tablet   Oral   Take 1 tablet (10 mg total) by mouth daily.   30 tablet   1   . Cyanocobalamin (VITAMIN B-12 PO)   Oral   Take by mouth every other day.         . diphenoxylate-atropine (LOMOTIL) 2.5-0.025 MG per tablet      Take one tablet by mouth once  daily   30 tablet   5   . feeding supplement, ENSURE COMPLETE, (ENSURE COMPLETE) LIQD   Oral   Take 237 mLs by mouth 3 (three) times daily between meals.   30 Bottle   1   . lisinopril (PRINIVIL,ZESTRIL) 10 MG tablet   Oral   Take 0.5 tablets (5 mg total) by mouth daily.   90 tablet   3   . LORazepam (ATIVAN) 0.5 MG tablet   Oral   Take 0.5 mg by mouth every 8 (eight) hours as needed for anxiety.         . Multiple Vitamins-Minerals (ANTIOXIDANT FORMULA SG) capsule   Oral   Take 1 capsule by mouth daily.         Marland Kitchen oxyCODONE (OXY IR/ROXICODONE) 5 MG immediate release tablet   Oral   Take 5 mg by mouth every 6 (six) hours as needed for severe pain.         . Probiotic Product (ALIGN PO)   Oral   Take 1 capsule by mouth daily with breakfast.         . simethicone (MYLICON) 80 MG chewable tablet   Oral   Chew 1 tablet (80 mg total) by mouth every 6 (six) hours as needed for flatulence (1 to 2 tablets).   30 tablet   0    BP 127/67  Pulse 75  Temp(Src) 97.6 F (36.4 C) (Oral)  Resp 21  SpO2 100% Physical Exam  Nursing note and vitals reviewed. Constitutional: She is oriented to person, place, and time. She appears well-developed and well-nourished.  HENT:  Head: Normocephalic.  Mouth/Throat: Oropharynx is clear and moist. No oropharyngeal exudate.  Eyes: Conjunctivae and EOM are normal. Pupils are equal, round, and reactive to light.  Neck: Normal range of motion. Neck supple.  Cardiovascular: Normal rate, regular rhythm, normal heart sounds and intact distal pulses.  Exam reveals no gallop and no friction rub.   No murmur heard. Pulmonary/Chest: Effort normal and breath sounds normal. No respiratory distress. She has no wheezes.  Abdominal: Soft. Bowel sounds are normal. There is no tenderness. There is no rebound and no guarding.  Abdominal surgical scar appears clean, dry, intact. No evidence of surrounding infection.  Colostomy with brown stool and  pink stoma.  Musculoskeletal: Normal range of motion. She exhibits no edema and no tenderness.  Neurological: She is alert and oriented to person, place, and time.  Skin: Skin is warm and dry.  Psychiatric: She has a normal mood and affect. Her behavior is normal.    ED Course  Procedures (including critical care time) Labs Review Labs Reviewed  CBC WITH DIFFERENTIAL - Abnormal; Notable for the following:    WBC 10.7 (*)    RDW 16.8 (*)  All other components within normal limits  COMPREHENSIVE METABOLIC PANEL - Abnormal; Notable for the following:    Sodium 122 (*)    Potassium 6.6 (*)    Chloride 94 (*)    CO2 12 (*)    Glucose, Bld 111 (*)    BUN 69 (*)    Creatinine, Ser 1.46 (*)    Calcium 10.9 (*)    Total Protein 8.4 (*)    Alkaline Phosphatase 124 (*)    GFR calc non Af Amer 33 (*)    GFR calc Af Amer 38 (*)    All other components within normal limits  LIPASE, BLOOD - Abnormal; Notable for the following:    Lipase 98 (*)    All other components within normal limits  URINALYSIS W MICROSCOPIC + REFLEX CULTURE - Abnormal; Notable for the following:    Leukocytes, UA MODERATE (*)    Bacteria, UA FEW (*)    Casts HYALINE CASTS (*)    All other components within normal limits  POTASSIUM - Abnormal; Notable for the following:    Potassium 6.5 (*)    All other components within normal limits  POTASSIUM - Abnormal; Notable for the following:    Potassium 6.3 (*)    All other components within normal limits  BASIC METABOLIC PANEL - Abnormal; Notable for the following:    Sodium 128 (*)    Chloride 95 (*)    Glucose, Bld 126 (*)    BUN 56 (*)    Creatinine, Ser 1.32 (*)    GFR calc non Af Amer 37 (*)    GFR calc Af Amer 43 (*)    All other components within normal limits  URINE CULTURE  TROPONIN I  LACTIC ACID, PLASMA  POTASSIUM   Imaging Review Dg Chest 2 View  03/05/2013   CLINICAL DATA:  Nausea, loss of appetite, history of colon cancer and hypertension.   EXAM: CHEST  2 VIEW  COMPARISON:  December 11, 2012  FINDINGS: The heart size and mediastinal contours are stable. Cardiac pacemaker is unchanged. There is no focal infiltrate, pulmonary edema, or pleural effusion. Vessel on end versus calcified granuloma is identified in the medial right lung base unchanged compared to prior exam. The visualized skeletal structures are stable.  IMPRESSION: No active cardiopulmonary disease.   Electronically Signed   By: Abelardo Diesel M.D.   On: 03/05/2013 18:26      Date: 03/05/2013  Rate: 72  Rhythm: normal sinus rhythm  QRS Axis: normal  Intervals: normal  ST/T Wave abnormalities: ST elevations inferiorly  Conduction Disutrbances:none  Narrative Interpretation: very minimial ST elev noted in inferior leads  Old EKG Reviewed: changes noted  CRITICAL CARE Performed by: Pamella Pert, S Total critical care time: 30 min Critical care time was exclusive of separately billable procedures and treating other patients. Critical care was necessary to treat or prevent imminent or life-threatening deterioration. Critical care was time spent personally by me on the following activities: development of treatment plan with patient and/or surrogate as well as nursing, discussions with consultants, evaluation of patient's response to treatment, examination of patient, obtaining history from patient or surrogate, ordering and performing treatments and interventions, ordering and review of laboratory studies, ordering and review of radiographic studies, pulse oximetry and re-evaluation of patient's condition.  MDM   Final diagnoses:  Renal insufficiency  Hyperkalemia  Hyponatremia  Metabolic acidosis    123456 PM 80 y.o. female status post surgical resection of abdominal tumor and colostomy  in November of 2014 who presents with nausea and generalized weakness. The patient has been in a rehabilitation facility for the last month. She notes that she was discharged from  this facility approximately one week ago. She notes persistent nausea and decreased oral intake at home due to this nausea. She denies any fevers, vomiting, or abdominal pain. She is afebrile and vital signs are unremarkable here. Will get symptom control, IV fluid hydration, and screening labwork.  Labwork showing hyperkalemia, ecg w/out peaked t waves. Will repeat K.   K remains elevated on repeat lab. Will give Ca gluc, amp of bicarb, small amt of insulin d/t RI and amp of d50. [facility] to admit.    Blanchard Kelch, MD 03/06/13 1330

## 2013-03-05 NOTE — ED Notes (Signed)
Bed: WA17 Expected date:  Expected time:  Means of arrival:  Comments: EMS-hold

## 2013-03-06 LAB — BASIC METABOLIC PANEL WITH GFR
BUN: 56 mg/dL — ABNORMAL HIGH (ref 6–23)
CO2: 19 meq/L (ref 19–32)
Calcium: 9.5 mg/dL (ref 8.4–10.5)
Chloride: 95 meq/L — ABNORMAL LOW (ref 96–112)
Creatinine, Ser: 1.32 mg/dL — ABNORMAL HIGH (ref 0.50–1.10)
GFR calc Af Amer: 43 mL/min — ABNORMAL LOW
GFR calc non Af Amer: 37 mL/min — ABNORMAL LOW
Glucose, Bld: 126 mg/dL — ABNORMAL HIGH (ref 70–99)
Potassium: 4.5 meq/L (ref 3.7–5.3)
Sodium: 128 meq/L — ABNORMAL LOW (ref 137–147)

## 2013-03-06 LAB — POTASSIUM: Potassium: 4.7 mEq/L (ref 3.7–5.3)

## 2013-03-06 MED ORDER — ENSURE COMPLETE PO LIQD
237.0000 mL | Freq: Two times a day (BID) | ORAL | Status: DC
  Administered 2013-03-06 – 2013-03-08 (×3): 237 mL via ORAL

## 2013-03-06 MED ORDER — ENSURE PUDDING PO PUDG
1.0000 | ORAL | Status: DC
  Administered 2013-03-06 – 2013-03-07 (×2): 1 via ORAL
  Filled 2013-03-06 (×4): qty 1

## 2013-03-06 NOTE — Progress Notes (Signed)
INITIAL NUTRITION ASSESSMENT  DOCUMENTATION CODES Per approved criteria  -Severe malnutrition in the context of chronic illness  Pt meets criteria for severe MALNUTRITION in the context of chronic illnes as evidenced by 9% weight loss in 3 month period, and <75% est nutrition needs for > one month.   INTERVENTION: Recommend to liberalize diet to encourage PO intake Recommend nourishments BID Continue with Ensure Complete Encouraged intake of high protein/calorie sources Placed pt on Room Service Assist for meal ordering assistance  NUTRITION DIAGNOSIS: Inadequate oral intake related to decreased appetite as evidenced by PO intake <75%, unintentional 12 lbs wt loss .   Goal: Pt to meet >/= 90% of their estimated nutrition needs    Monitor:  Diet order, total protein/energy intake, labs, weights, GI profile  Reason for Assessment: Consult to Assess/MST  80 y.o. 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 Patient also had severe protein calorie malnutrition and deconditioning. Patient was discharged home from skilled nursing facility one week back. Since she went back home patient has had increased nausea with poor appetite and generalized weakness  -Pt with unintentional wt loss of 12 lbs since 11/2012 after series of GI procedures -Appetite has decreased over past 3 months, noted that foods have lost their taste and pt does not know what foods she should be eating to assist in weight  gain/nutrient replenishment. Encouraged intake of small frequent meals and high protein/kcal snacks. Pt in agreement to have snacks BID while during admit. Will place pt on Room Service Assist program to help guide pt through ordering process -Diet recall indicates breakfast of eggs and toast. Has minimal intake for remainder of day. Will drink one Ensure daily -Had decreased intake during time at SNF d/t dislike of foods options at facility -MD also noted pt with nausea, which likely contributed to minimal intake. -Nausea has improved and pt was able to consume > 50% of breakfast. Pt would benefit from diet liberalization to provide more meal options and encourage PO intake   Height: Ht Readings from Last 1 Encounters:  03/05/13 5\' 5"  (1.651 m)    Weight: Wt Readings from Last 1 Encounters:  03/05/13 119 lb 14.9 oz (54.4 kg)    Ideal Body Weight: 125 lbs  % Ideal Body Weight: 95%  Wt Readings from Last 10 Encounters:  03/05/13 119 lb 14.9 oz (54.4 kg)  02/16/13 125 lb (56.7 kg)  01/15/13 118 lb (53.524 kg)  12/28/12 134 lb 0.6 oz (60.8 kg)  12/28/12 134 lb 0.6 oz (60.8 kg)  12/28/12 134 lb 0.6 oz (60.8 kg)  12/28/12 134 lb 0.6 oz (60.8 kg)  12/28/12 134 lb 0.6 oz (60.8 kg)  10/04/12 128 lb 11.2 oz (58.378 kg)  09/06/12 128 lb (58.06 kg)    Usual Body Weight: 131 lbs  % Usual Body Weight: 91%  BMI:  Body mass index is 19.96 kg/(m^2).  Estimated Nutritional Needs: Kcal: 1650-1850 kcal Protein: 70-80 gram Fluid:>/=1700 ml/daily  Skin: WDL  Diet Order: Cardiac  EDUCATION NEEDS: -Education needs addressed: Encouraged high protein/kcal foods  Intake/Output Summary (Last 24 hours) at 03/06/13 1028 Last data filed at 03/06/13 0806  Gross per 24 hour  Intake 1036.67 ml  Output   1950 ml  Net -913.33 ml    Last BM: 2/10-ileostomy   Labs:   Recent Labs Lab 03/05/13 1730  03/05/13 2050 03/06/13 0040 03/06/13 0453  NA 122*  --   --   --  128*  K 6.6*  < >  6.3* 4.7 4.5  CL 94*  --   --   --  95*  CO2 12*  --   --   --  19  BUN 69*  --   --   --  56*  CREATININE 1.46*  --   --   --  1.32*  CALCIUM 10.9*  --   --   --  9.5  GLUCOSE 111*  --   --   --  126*  < > = values in this interval not displayed.  CBG (last 3)  No results found for this basename: GLUCAP,  in the last 72 hours  Scheduled Meds: . cefTRIAXone (ROCEPHIN)  IV  1 g Intravenous Q24H  . cholecalciferol  2,000 Units Oral Daily  . citalopram  10 mg Oral Daily  . enoxaparin (LOVENOX) injection  30 mg Subcutaneous Q24H  . feeding supplement (ENSURE COMPLETE)  237 mL Oral TID BM  . FLORA-Q  1 capsule Oral Daily  . multivitamin with minerals  1 tablet Oral Daily  . sodium chloride  3 mL Intravenous Q12H  . vitamin B-12  100 mcg Oral Daily    Continuous Infusions: . dextrose 5 % 1,000 mL with sodium bicarbonate 150 mEq infusion 75 mL/hr at 03/06/13 5361    Past Medical History  Diagnosis Date  . Breast cancer   . HTN (hypertension) August 2014  . Syncope and collapse August 2014    sinus pauses/ PTVDP  . Colitis   . Colon cancer 02/21/2013    Past Surgical History  Procedure Laterality Date  . Breast lumpectomy    . Cholecystectomy    . Tonsillectomy    . Pacemaker insertion  08/28/12    MDT  . Flexible sigmoidoscopy N/A 12/06/2012    Procedure: FLEXIBLE SIGMOIDOSCOPY;  Surgeon: Lear Ng, MD;  Location: [place];  Service: Endoscopy;  Laterality: N/A;  . Laparoscopic partial colectomy N/A 12/07/2012    Procedure: LAPAROSCOPIC-ASSISTED TRANSVERSE COLECTOMY;  Surgeon: Joyice Faster. Cornett, MD;  Location: [place];  Service: General;  Laterality: N/A;  . Colonoscopy N/A 12/15/2012    Procedure: COLONOSCOPY;  Surgeon: Jeryl Columbia, MD;  Location[facility] ENDOSCOPY;  Service: Endoscopy;  Laterality: N/A;  . Bowel decompression N/A 12/15/2012    Procedure: BOWEL DECOMPRESSION;  Surgeon: Jeryl Columbia, MD;  Location: [facility] ENDOSCOPY;  Service: Endoscopy;  Laterality: N/A;   . Colon resection N/A 12/22/2012    Procedure: EXPLORATORY LAPAROTOMY , RIGHT Colectomy IILEOSTOMY;  Surgeon: Rolm Bookbinder, MD;  Location: [place];  Service: General;  Laterality: N/A;    Atlee Abide MS RD LDN Clinical Dietitian [phone]

## 2013-03-06 NOTE — Care Management Note (Addendum)
    Page 1 of 1   03/08/2013     1:00:06 PM   CARE MANAGEMENT NOTE 03/08/2013  Patient:  JANAI, MAUDLIN   Account Number:  [id number]  Date Initiated:  03/06/2013  Documentation initiated by:  [facility]  Subjective/Objective Assessment:   81 Y/O F ADMITTED W/N/V,WEAKNESS.     Action/Plan:   FROM HOME.ACTIVE W/[facility]-HHRN/PT/OT.   Anticipated DC Date:  03/08/2013   Anticipated DC Plan:  [place]  CM consult      [facility] Choice  Resumption Of Svcs/PTA Provider   Choice offered to / List presented to:             Status of service:  Completed, signed off Medicare Important Message given?  YES (If response is "NO", the following Medicare IM given date fields will be blank) Date Medicare IM given:  03/08/2013 Date Additional Medicare IM given:  03/08/2013  Discharge Disposition:  [place]  Per UR Regulation:  Reviewed for med. necessity/level of care/duration of stay  If discussed at Long Length of Stay Meetings, dates discussed:    Comments:  03/08/13 Sandi Towe RN,BSN NCM [phone] [place] AGREED TO SNF.D/C ORDER-SNF.  03/06/13 Kwana Ringel RN,BSN NCM [phone] WILL NEED [facility]HC-HHRN(COLOSTOMY)/PT/OT ORDERS @ D/C.

## 2013-03-06 NOTE — Progress Notes (Signed)
[facility]  Patient Status: Active (receiving services up to time of hospitalization)  [facility] is providing the following services: RN, PT and OT  If patient discharges after hours, please call [phone].   Tamara [name] 03/06/2013, 3:40 PM

## 2013-03-06 NOTE — Progress Notes (Signed)
Subjective: Tamara [name] was admitted yesterday for poor appetite and generalized weakness. She had a hypochloremic metabolic acidosis and severe hyponatremia and moderate hyperkalemia as well. Lisinopril has been held. Feeling much better with IV hydration and correction of electrolytes. Sodium and chloride and bicarbonate and potassium are all correcting on current IV infusion. Nausea has resolved and patient is hungry  Objective: Weight change:   Intake/Output Summary (Last 24 hours) at 03/06/13 0815 Last data filed at 03/06/13 0806  Gross per 24 hour  Intake 1036.67 ml  Output   1950 ml  Net -913.33 ml   Filed Vitals:   03/05/13 1900 03/05/13 2100 03/05/13 2214 03/06/13 0515  BP:  132/61 115/50 99/46  Pulse: 84 85 72 80  Temp:  97.8 F (36.6 C) 97.5 F (36.4 C) 97.3 F (36.3 C)  TempSrc:  Oral Oral Oral  Resp: '15 20 24 16  ' Height:   '5\' 5"'  (1.651 m)   Weight:   119 lb 14.9 oz (54.4 kg)   SpO2: 99% 100% 100% 100%    General Appearance: Alert, cooperative, no distress, appears stated age HEENT:  Oropharynx is moist,  tongue is normal size and well papillated Lungs: Clear to auscultation bilaterally, respirations unlabored Heart: Regular rate and rhythm, S1 and S2 normal, no murmur, rub or gallop Abdomen: Soft, non-tender, bowel sounds active all four quadrants, no masses, no organomegaly. Ostomy bag in place Extremities: Extremities normal, atraumatic, no cyanosis or edema Neuro: Oriented x3, nonfocal, alert  Lab Results: Results for orders placed during the hospital encounter of 03/05/13 (from the past 48 hour(s))  CBC WITH DIFFERENTIAL     Status: Abnormal   Collection Time    03/05/13  5:30 PM      Result Value Range   WBC 10.7 (*) 4.0 - 10.5 K/uL   RBC 4.13  3.87 - 5.11 MIL/uL   Hemoglobin 13.0  12.0 - 15.0 g/dL   HCT 36.3  36.0 - 46.0 %   MCV 87.9  78.0 - 100.0 fL   MCH 31.5  26.0 - 34.0 pg   MCHC 35.8  30.0 - 36.0 g/dL   RDW 16.8 (*) 11.5 - 15.5 %   Platelets  210  150 - 400 K/uL   Neutrophils Relative % 67  43 - 77 %   Neutro Abs 7.2  1.7 - 7.7 K/uL   Lymphocytes Relative 23  12 - 46 %   Lymphs Abs 2.5  0.7 - 4.0 K/uL   Monocytes Relative 7  3 - 12 %   Monocytes Absolute 0.8  0.1 - 1.0 K/uL   Eosinophils Relative 2  0 - 5 %   Eosinophils Absolute 0.2  0.0 - 0.7 K/uL   Basophils Relative 1  0 - 1 %   Basophils Absolute 0.1  0.0 - 0.1 K/uL  COMPREHENSIVE METABOLIC PANEL     Status: Abnormal   Collection Time    03/05/13  5:30 PM      Result Value Range   Sodium 122 (*) 137 - 147 mEq/L   Potassium 6.6 (*) 3.7 - 5.3 mEq/L   Comment: CRITICAL RESULT CALLED TO, READ BACK BY AND VERIFIED WITH:     B HAUZER RN 1858 03/05/13 A NAVARRO   Chloride 94 (*) 96 - 112 mEq/L   CO2 12 (*) 19 - 32 mEq/L   Glucose, Bld 111 (*) 70 - 99 mg/dL   BUN 69 (*) 6 - 23 mg/dL   Creatinine, Ser 1.46 (*) 0.50 -  1.10 mg/dL   Calcium 10.9 (*) 8.4 - 10.5 mg/dL   Total Protein 8.4 (*) 6.0 - 8.3 g/dL   Albumin 4.1  3.5 - 5.2 g/dL   AST 24  0 - 37 U/L   ALT 26  0 - 35 U/L   Alkaline Phosphatase 124 (*) 39 - 117 U/L   Total Bilirubin 0.5  0.3 - 1.2 mg/dL   GFR calc non Af Amer 33 (*) >90 mL/min   GFR calc Af Amer 38 (*) >90 mL/min   Comment: (NOTE)     The eGFR has been calculated using the CKD EPI equation.     This calculation has not been validated in all clinical situations.     eGFR's persistently <90 mL/min signify possible Chronic Kidney     Disease.  LIPASE, BLOOD     Status: Abnormal   Collection Time    03/05/13  5:30 PM      Result Value Range   Lipase 98 (*) 11 - 59 U/L  TROPONIN I     Status: None   Collection Time    03/05/13  5:30 PM      Result Value Range   Troponin I <0.30  <0.30 ng/mL   Comment:            Due to the release kinetics of cTnI,     a negative result within the first hours     of the onset of symptoms does not rule out     myocardial infarction with certainty.     If myocardial infarction is still suspected,     repeat the  test at appropriate intervals.  URINALYSIS W MICROSCOPIC + REFLEX CULTURE     Status: Abnormal   Collection Time    03/05/13  6:43 PM      Result Value Range   Color, Urine YELLOW  YELLOW   APPearance CLEAR  CLEAR   Specific Gravity, Urine 1.020  1.005 - 1.030   pH 5.5  5.0 - 8.0   Glucose, UA NEGATIVE  NEGATIVE mg/dL   Hgb urine dipstick NEGATIVE  NEGATIVE   Bilirubin Urine NEGATIVE  NEGATIVE   Ketones, ur NEGATIVE  NEGATIVE mg/dL   Protein, ur NEGATIVE  NEGATIVE mg/dL   Urobilinogen, UA 0.2  0.0 - 1.0 mg/dL   Nitrite NEGATIVE  NEGATIVE   Leukocytes, UA MODERATE (*) NEGATIVE   WBC, UA 7-10  <3 WBC/hpf   RBC / HPF 0-2  <3 RBC/hpf   Bacteria, UA FEW (*) RARE   Casts HYALINE CASTS (*) NEGATIVE   Comment: GRANULAR CAST  POTASSIUM     Status: Abnormal   Collection Time    03/05/13  7:15 PM      Result Value Range   Potassium 6.5 (*) 3.7 - 5.3 mEq/L   Comment: MODERATE HEMOLYSIS     HEMOLYSIS AT THIS LEVEL MAY AFFECT RESULT     CRITICAL RESULT CALLED TO, READ BACK BY AND VERIFIED WITH:     DOSTER RN 2008 03/05/13 A NAVARRO  POTASSIUM     Status: Abnormal   Collection Time    03/05/13  8:50 PM      Result Value Range   Potassium 6.3 (*) 3.7 - 5.3 mEq/L  LACTIC ACID, PLASMA     Status: None   Collection Time    03/05/13 10:25 PM      Result Value Range   Lactic Acid, Venous 1.2  0.5 - 2.2 mmol/L  POTASSIUM  Status: None   Collection Time    03/06/13 12:40 AM      Result Value Range   Potassium 4.7  3.7 - 5.3 mEq/L   Comment: DELTA CHECK NOTED  BASIC METABOLIC PANEL     Status: Abnormal   Collection Time    03/06/13  4:53 AM      Result Value Range   Sodium 128 (*) 137 - 147 mEq/L   Potassium 4.5  3.7 - 5.3 mEq/L   Chloride 95 (*) 96 - 112 mEq/L   CO2 19  19 - 32 mEq/L   Glucose, Bld 126 (*) 70 - 99 mg/dL   BUN 56 (*) 6 - 23 mg/dL   Creatinine, Ser 1.32 (*) 0.50 - 1.10 mg/dL   Calcium 9.5  8.4 - 10.5 mg/dL   GFR calc non Af Amer 37 (*) >90 mL/min   GFR calc Af  Amer 43 (*) >90 mL/min   Comment: (NOTE)     The eGFR has been calculated using the CKD EPI equation.     This calculation has not been validated in all clinical situations.     eGFR's persistently <90 mL/min signify possible Chronic Kidney     Disease.    Studies/Results: Dg Chest 2 View  03/05/2013   CLINICAL DATA:  Nausea, loss of appetite, history of colon cancer and hypertension.  EXAM: CHEST  2 VIEW  COMPARISON:  December 11, 2012  FINDINGS: The heart size and mediastinal contours are stable. Cardiac pacemaker is unchanged. There is no focal infiltrate, pulmonary edema, or pleural effusion. Vessel on end versus calcified granuloma is identified in the medial right lung base unchanged compared to prior exam. The visualized skeletal structures are stable.  IMPRESSION: No active cardiopulmonary disease.   Electronically Signed   By: Abelardo Diesel M.D.   On: 03/05/2013 18:26   Medications: Scheduled Meds: . cefTRIAXone (ROCEPHIN)  IV  1 g Intravenous Q24H  . cholecalciferol  2,000 Units Oral Daily  . citalopram  10 mg Oral Daily  . enoxaparin (LOVENOX) injection  30 mg Subcutaneous Q24H  . feeding supplement (ENSURE COMPLETE)  237 mL Oral TID BM  . FLORA-Q  1 capsule Oral Daily  . multivitamin with minerals  1 tablet Oral Daily  . sodium chloride  3 mL Intravenous Q12H  . vitamin B-12  100 mcg Oral Daily   Continuous Infusions: . 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 Pearletha Alfred, currently not on chemotherapy   Hyponatremia -  improved on current infusion of D5 with sodium bicarbonate, reduced to 75 cc an hour and check basic metabolic profile in a.m.   Dehydration, severe - improving, oropharynx moist   Hyperkalemia - resolved   Acute kidney injury - improving   Protein calorie malnutrition - nutrition consult to assure adequate calories   UTI (urinary tract infection) - started on Rocephin, culture pending   Disposition - hope is to send home in the next couple of days. We'll have her ambulate today and advance diet    LOS: 1 day   Henrine Screws, MD 03/06/2013, 8:15 AM

## 2013-03-07 DIAGNOSIS — E43 Unspecified severe protein-calorie malnutrition: Secondary | ICD-10-CM | POA: Insufficient documentation

## 2013-03-07 LAB — URINE CULTURE

## 2013-03-07 LAB — BASIC METABOLIC PANEL
BUN: 35 mg/dL — ABNORMAL HIGH (ref 6–23)
CHLORIDE: 92 meq/L — AB (ref 96–112)
CO2: 33 meq/L — AB (ref 19–32)
Calcium: 9.2 mg/dL (ref 8.4–10.5)
Creatinine, Ser: 0.99 mg/dL (ref 0.50–1.10)
GFR calc Af Amer: 60 mL/min — ABNORMAL LOW (ref >90)
GFR calc non Af Amer: 52 mL/min — ABNORMAL LOW (ref >90)
GLUCOSE: 101 mg/dL — AB (ref 70–99)
POTASSIUM: 4.2 meq/L (ref 3.7–5.3)
Sodium: 132 mEq/L — ABNORMAL LOW (ref 137–147)

## 2013-03-07 MED ORDER — ENOXAPARIN SODIUM 40 MG/0.4ML ~~LOC~~ SOLN
40.0000 mg | SUBCUTANEOUS | Status: DC
  Administered 2013-03-07: 40 mg via SUBCUTANEOUS
  Filled 2013-03-07 (×2): qty 0.4

## 2013-03-07 NOTE — Evaluation (Signed)
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 Patient also had severe protein calorie malnutrition and deconditioning. Patient was discharged home from skilled nursing facility one week back. Since she went back home patient has had increased nausea with poor appetite and generalized weakness. She denies change in ileostomy output.  Clinical Impression  Pt admitted with above. Pt currently with functional limitations due to the deficits listed below (see PT Problem List).  Pt will benefit from skilled PT to increase their independence and safety with mobility to allow discharge to the venue listed below.  Pt reports recent d/c home from [facility] and she wishes to return home upon d/c.  Pt also states she lives with spouse however he had MD appt this afternoon due to not feeling well and he usually assists her.  She reports since d/c from SNF to home, she has someone with her during mobility.     PT Assessment  Patient needs continued PT services    Follow Up Recommendations  Home health PT    Does the patient have the potential to tolerate intense rehabilitation      Barriers to Discharge Decreased caregiver support      Equipment Recommendations  None recommended by PT    Recommendations for Other Services      Frequency Min 3X/week    Precautions / Restrictions Precautions Precaution Comments: colostomy   Pertinent Vitals/Pain Denies pain, repositioned in recliner      Mobility  Bed Mobility Overal bed mobility: Needs Assistance Bed Mobility: Supine to Sit Supine to sit: Min assist General bed mobility comments: assist for trunk upright Transfers Overall transfer level: Needs assistance Equipment used: Rolling walker (2 wheeled) Transfers: Sit to/from Stand Sit to Stand: Min guard General transfer comment: verbal cues for hand placement, pt reports requiring increased time for mobility ("I'm very slow") Ambulation/Gait Ambulation/Gait assistance: Min guard Ambulation Distance (Feet): 200 Feet Assistive device: Rolling walker (2 wheeled) Gait Pattern/deviations: Step-through pattern Gait velocity: decr General Gait Details: pt reports she usually doesn't need assistive device however agreeable to use RW as she states she hasn't walked in days    Exercises     PT Diagnosis: Difficulty walking  PT Problem List: Decreased strength;Decreased activity tolerance;Decreased mobility;Decreased knowledge of use of DME PT Treatment Interventions: DME instruction;Gait training;Functional mobility training;Therapeutic activities;Therapeutic exercise;Patient/family education     PT Goals(Current goals can be found in the care plan section) Acute Rehab PT Goals PT Goal Formulation: With patient Time For Goal Achievement: 03/21/13 Potential to Achieve Goals: Good  Visit Information  Last PT Received On: 03/07/13 Assistance Needed: +1 History of Present Illness: 80 year old female with history of hypertension, CAD, history of left breast cancer, syncope with pacemaker implant in 2014, 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 Patient also had severe protein calorie malnutrition and deconditioning. Patient was discharged home from skilled nursing facility one week back. Since she went back home patient has had increased nausea with poor appetite and generalized weakness. She denies change in ileostomy output.       Prior [place] expects to be discharged to:: Private residence Living Arrangements: Spouse/significant other Available Help at Discharge: Family;Available PRN/intermittently Type of Home: House Home Access: Level entry Home Layout: Able to live on main level with bedroom/bathroom Home Equipment: Walker - 2 wheels Prior Function Level of Independence: Independent Communication Communication: No difficulties    Cognition  Cognition Arousal/Alertness: Awake/alert Behavior During Therapy: WFL for tasks assessed/performed Overall Cognitive Status: No family/caregiver present to determine baseline cognitive functioning Memory: Decreased short-term memory    Extremity/Trunk Assessment Lower Extremity Assessment Lower Extremity Assessment: Generalized weakness Cervical / Trunk Assessment Cervical / Trunk Assessment: Normal   Balance    End of Session PT - End of Session Activity Tolerance: Patient tolerated treatment well Patient left: in chair;with call bell/phone within reach Nurse Communication: Mobility status (observed pt ambulating)  GP     Grainger Mccarley,KATHrine E 03/07/2013, 4:06 PM Carmelia Bake, PT, DPT 03/07/2013 Pager: [phone]

## 2013-03-07 NOTE — Consult Note (Signed)
[place]  [phone]    HOSPITAL PROGRESS NOTE  DIAGNOSES:  1. 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  2. Remote history of infiltrating ductal carcinoma of the left breast.  3. History of ductal carcinoma in situ of the right breast.  HPI: Tamara [name] is a 80 year old woman Nursing Home resident  With multiple medical issues listed below, who was admitted on 2/9 with nausea and poor oral intake. SHe was dehydrated requiring IVF.Blood work done showed a mild leukocytosis with WBC of 10.7, normal hemoglobin/hematocrit and platelets. She was hyponatremic with sodium of 122, hypokalemic with potassium of 6.6, chloride of 94, bicarbonate 12, with anion gap metabolic acidosis of 16. She also had acute kidney injury with BUN of 69 and creatinine of 1.46. Calcium was 10.9. Glucose was 111. EKG was done which was unremarkable. Initial troponin was negative. Repeat potassium was 6.5. Chest x-ray done was unremarkable. UA done showed moderate leukocytosis and few bacteria. Her metabolic acidosis was treated at the ED. She was diagnosed with UTI, receiving Rocephin. While all of her medical issues are being managed by the admitting team, we have been kindly informed of the patient's admission.    MEDICATIONS:  Scheduled Meds: . cefTRIAXone (ROCEPHIN)  IV  1 g Intravenous Q24H  . cholecalciferol  2,000 Units Oral Daily  . citalopram  10 mg Oral Daily  . enoxaparin (LOVENOX) injection  30 mg Subcutaneous Q24H  . feeding supplement (ENSURE COMPLETE)  237 mL Oral BID BM  . feeding supplement (ENSURE)  1 Container Oral Q24H  . FLORA-Q  1 capsule Oral Daily  . multivitamin with minerals  1 tablet Oral Daily  . sodium chloride  3 mL Intravenous Q12H  . vitamin B-12  100 mcg Oral Daily   Continuous Infusions: . dextrose 5 % 1,000 mL with sodium bicarbonate 150 mEq infusion 75 mL/hr at 03/06/13 1848   PRN Meds:.acetaminophen, acetaminophen,  diphenoxylate-atropine, LORazepam, oxyCODONE, simethicone  ALLERGIES:   Allergies  Allergen Reactions  . Codeine Nausea And Vomiting  . Zofran [Ondansetron Hcl]    Past Medical History  Diagnosis Date  . Breast cancer   . HTN (hypertension) August 2014  . Syncope and collapse August 2014    sinus pauses/ PTVDP  . Colitis   . Colon cancer 02/21/2013   Past Surgical History  Procedure Laterality Date  . Breast lumpectomy    . Cholecystectomy    . Tonsillectomy    . Pacemaker insertion  08/28/12    MDT  . Flexible sigmoidoscopy N/A 12/06/2012    Procedure: FLEXIBLE SIGMOIDOSCOPY;  Surgeon: Lear Ng, MD;  Location: [place];  Service: Endoscopy;  Laterality: N/A;  . Laparoscopic partial colectomy N/A 12/07/2012    Procedure: LAPAROSCOPIC-ASSISTED TRANSVERSE COLECTOMY;  Surgeon: Joyice Faster. Cornett, MD;  Location: [place];  Service: General;  Laterality: N/A;  . Colonoscopy N/A 12/15/2012    Procedure: COLONOSCOPY;  Surgeon: Jeryl Columbia, MD;  Location: [facility] ENDOSCOPY;  Service: Endoscopy;  Laterality: N/A;  . Bowel decompression N/A 12/15/2012    Procedure: BOWEL DECOMPRESSION;  Surgeon: Jeryl Columbia, MD;  Location: [facility] ENDOSCOPY;  Service: Endoscopy;  Laterality: N/A;  . Colon resection N/A 12/22/2012    Procedure: EXPLORATORY LAPAROTOMY , RIGHT Colectomy IILEOSTOMY;  Surgeon: Rolm Bookbinder, MD;  Location: [place];  Service: General;  Laterality: N/A;    PHYSICAL EXAMINATION:   Filed Vitals:   03/07/13 0750  BP: 119/51  Pulse: 76  Temp: 98 F (36.7 C)  Resp: 16   Filed Weights   03/05/13 2214  Weight: 119 lb 14.9 oz (47.62 kg)   80 year old white female in no acute distress A. and O. x3 General well-developed and well-nourished  HEENT: Normocephalic, atraumatic, PERRLA. Oral cavity without thrush or lesions. Neck supple. no thyromegaly, no cervical or supraclavicular adenopathy  Lungs clear bilaterally . No wheezing, rhonchi or rales. Cardiac: regular rate  and rhythm,no murmur , rubs or gallops Abdomen soft nontender , bowel sounds x4. No HSM. Ostomy bag present. Extremities no clubbing cyanosis or edema. No bruising or petechial rash Neuro: non focal  LABORATORY/RADIOLOGY DATA:   Recent Labs Lab 03/05/13 1730  WBC 10.7*  HGB 13.0  HCT 36.3  PLT 210  MCV 87.9  MCH 31.5  MCHC 35.8  RDW 16.8*  LYMPHSABS 2.5  MONOABS 0.8  EOSABS 0.2  BASOSABS 0.1    CMP    Recent Labs Lab 03/05/13 1730 03/05/13 1915 03/05/13 2050 03/06/13 0040 03/06/13 0453 03/07/13 0508  NA 122*  --   --   --  128* 132*  K 6.6* 6.5* 6.3* 4.7 4.5 4.2  CL 94*  --   --   --  95* 92*  CO2 12*  --   --   --  19 33*  GLUCOSE 111*  --   --   --  126* 101*  BUN 69*  --   --   --  56* 35*  CREATININE 1.46*  --   --   --  1.32* 0.99  CALCIUM 10.9*  --   --   --  9.5 9.2  AST 24  --   --   --   --   --   ALT 26  --   --   --   --   --   ALKPHOS 124*  --   --   --   --   --   BILITOT 0.5  --   --   --   --   --         Component Value Date/Time   BILITOT 0.5 03/05/2013 1730     Urinalysis    Component Value Date/Time   COLORURINE YELLOW 03/05/2013 1843   APPEARANCEUR CLEAR 03/05/2013 1843   LABSPEC 1.020 03/05/2013 1843   PHURINE 5.5 03/05/2013 1843   GLUCOSEU NEGATIVE 03/05/2013 1843   HGBUR NEGATIVE 03/05/2013 1843   BILIRUBINUR NEGATIVE 03/05/2013 1843   KETONESUR NEGATIVE 03/05/2013 1843   PROTEINUR NEGATIVE 03/05/2013 1843   UROBILINOGEN 0.2 03/05/2013 1843   NITRITE NEGATIVE 03/05/2013 1843   LEUKOCYTESUR MODERATE* 03/05/2013 1843     Liver Function Tests:  Recent Labs Lab 03/05/13 1730  AST 24  ALT 26  ALKPHOS 124*  BILITOT 0.5  PROT 8.4*  ALBUMIN 4.1    Recent Labs Lab 03/05/13 1730  LIPASE 98*    Radiology Studies:  Dg Chest 2 View  03/05/2013   CLINICAL DATA:  Nausea, loss of appetite, history of colon cancer and hypertension.  EXAM: CHEST  2 VIEW  COMPARISON:  December 11, 2012  FINDINGS: The heart size and mediastinal contours are  stable. Cardiac pacemaker is unchanged. There is no focal infiltrate, pulmonary edema, or pleural effusion. Vessel on end versus calcified granuloma is identified in the medial right lung base unchanged compared to prior exam. The visualized skeletal structures are stable.  IMPRESSION: No active cardiopulmonary disease.   Electronically Signed   By: Abelardo Diesel  M.D.   On: 03/05/2013 18:26       ASSESSMENT AND PLAN:  13.80 year old white female. She has a new diagnosis stage IIIB colon cancer in the transverse colon. She had 16 lymph nodes that were negative. She did have a satellite nodule that was positive. Patient not a candidate for adjuvant chemotherapy due to poor performance status. She is on close monitoring. 2. History of stage IIA ductal carcinoma of the left breast. She was triple negative at that time. She received chemotherapy with [facility] for eight cycles. She also had radiation therapy. 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 3.Nausea and poor oral intake, improving after Reglan and IVFs.Appreciate Nutrition evaluation.  4. Metabolic acidosis secondary to severe dehydration and decreased oral intake. 5.UTI, on Rocephin 6.Agree wit PT/OT   Other medical issues as per admitting team.  Tamara Jumbo, PA-C 03/07/2013, 9:55 AM  ADDENDUM:  As above.  Nothing for [place] to do.  No obvious cancer issue.  She is so de-conditioned from her surgery and subsequent complications.  If she did have recurrent cancer, i do not see any way that she could be treated.  I will keep my appt with her as scheduled.  Lum Keas

## 2013-03-07 NOTE — Progress Notes (Signed)
Subjective: [name] continues to feel better. Electrolytes are correcting and acute renal failure is resolving. We'll plan for physical therapy to start today. Plan to stop her IV this afternoon. Still feeling weak and will need physical therapy at home. Nausea has resolved  Objective: Weight change:   Intake/Output Summary (Last 24 hours) at 03/07/13 0752 Last data filed at 03/07/13 0522  Gross per 24 hour  Intake 2480.83 ml  Output   2050 ml  Net 430.83 ml   Filed Vitals:   03/06/13 0515 03/06/13 1359 03/06/13 2015 03/07/13 0514  BP: 99/46 137/43 110/44 134/57  Pulse: 80 91 81 80  Temp: 97.3 F (36.3 C) 98.5 F (36.9 C) 98.5 F (36.9 C) 97.6 F (36.4 C)  TempSrc: Oral Oral Oral Oral  Resp: _0 Height:      Weight:      SpO2: 100% 99% 99% 98%    General Appearance: Alert, cooperative, no distress, appears stated age  HEENT: Oropharynx is moist, tongue is normal size and well papillated  Lungs: Clear to auscultation bilaterally, respirations unlabored  Heart: Regular rate and rhythm, S1 and S2 normal, no murmur, rub or gallop  Abdomen: Soft, non-tender, bowel sounds active all four quadrants, no masses, no organomegaly. Ostomy bag in place  Extremities: Extremities normal, atraumatic, no cyanosis or edema  Neuro: Oriented x3, nonfocal, alert    Lab Results: Results for orders placed during the hospital encounter of 03/05/13 (from the past 48 hour(s))  CBC WITH DIFFERENTIAL     Status: Abnormal   Collection Time    03/05/13  5:30 PM      Result Value Ref Range   WBC 10.7 (*) 4.0 - 10.5 K/uL   RBC 4.13  3.87 - 5.11 MIL/uL   Hemoglobin 13.0  12.0 - 15.0 g/dL   HCT 36.3  36.0 - 46.0 %   MCV 87.9  78.0 - 100.0 fL   MCH 31.5  26.0 - 34.0 pg   MCHC 35.8  30.0 - 36.0 g/dL   RDW 16.8 (*) 11.5 - 15.5 %   Platelets 210  150 - 400 K/uL   Neutrophils Relative % 67  43 - 77 %   Neutro Abs 7.2  1.7 - 7.7 K/uL   Lymphocytes Relative 23  12 - 46 %   Lymphs Abs  2.5  0.7 - 4.0 K/uL   Monocytes Relative 7  3 - 12 %   Monocytes Absolute 0.8  0.1 - 1.0 K/uL   Eosinophils Relative 2  0 - 5 %   Eosinophils Absolute 0.2  0.0 - 0.7 K/uL   Basophils Relative 1  0 - 1 %   Basophils Absolute 0.1  0.0 - 0.1 K/uL  COMPREHENSIVE METABOLIC PANEL     Status: Abnormal   Collection Time    03/05/13  5:30 PM      Result Value Ref Range   Sodium 122 (*) 137 - 147 mEq/L   Potassium 6.6 (*) 3.7 - 5.3 mEq/L   Comment: CRITICAL RESULT CALLED TO, READ BACK BY AND VERIFIED WITH:     B HAUZER RN [id number] 03/05/13 A NAVARRO   Chloride 94 (*) 96 - 112 mEq/L   CO2 12 (*) 19 - 32 mEq/L   Glucose, Bld 111 (*) 70 - 99 mg/dL   BUN 69 (*) 6 - 23 mg/dL   Creatinine, Ser 1.46 (*) 0.50 - 1.10 mg/dL   Calcium 10.9 (*) 8.4 - 10.5 mg/dL  Total Protein 8.4 (*) 6.0 - 8.3 g/dL   Albumin 4.1  3.5 - 5.2 g/dL   AST 24  0 - 37 U/L   ALT 26  0 - 35 U/L   Alkaline Phosphatase 124 (*) 39 - 117 U/L   Total Bilirubin 0.5  0.3 - 1.2 mg/dL   GFR calc non Af Amer 33 (*) >90 mL/min   GFR calc Af Amer 38 (*) >90 mL/min   Comment: (NOTE)     The eGFR has been calculated using the CKD EPI equation.     This calculation has not been validated in all clinical situations.     eGFR's persistently <90 mL/min signify possible Chronic Kidney     Disease.  LIPASE, BLOOD     Status: Abnormal   Collection Time    03/05/13  5:30 PM      Result Value Ref Range   Lipase 98 (*) 11 - 59 U/L  TROPONIN I     Status: None   Collection Time    03/05/13  5:30 PM      Result Value Ref Range   Troponin I <0.30  <0.30 ng/mL   Comment:            Due to the release kinetics of cTnI,     a negative result within the first hours     of the onset of symptoms does not rule out     myocardial infarction with certainty.     If myocardial infarction is still suspected,     repeat the test at appropriate intervals.  URINALYSIS W MICROSCOPIC + REFLEX CULTURE     Status: Abnormal   Collection Time    03/05/13  6:43  PM      Result Value Ref Range   Color, Urine YELLOW  YELLOW   APPearance CLEAR  CLEAR   Specific Gravity, Urine 1.020  1.005 - 1.030   pH 5.5  5.0 - 8.0   Glucose, UA NEGATIVE  NEGATIVE mg/dL   Hgb urine dipstick NEGATIVE  NEGATIVE   Bilirubin Urine NEGATIVE  NEGATIVE   Ketones, ur NEGATIVE  NEGATIVE mg/dL   Protein, ur NEGATIVE  NEGATIVE mg/dL   Urobilinogen, UA 0.2  0.0 - 1.0 mg/dL   Nitrite NEGATIVE  NEGATIVE   Leukocytes, UA MODERATE (*) NEGATIVE   WBC, UA 7-10  <3 WBC/hpf   RBC / HPF 0-2  <3 RBC/hpf   Bacteria, UA FEW (*) RARE   Casts HYALINE CASTS (*) NEGATIVE   Comment: GRANULAR CAST  URINE CULTURE     Status: None   Collection Time    03/05/13  6:43 PM      Result Value Ref Range   Specimen Description URINE, RANDOM     Special Requests NONE     Culture  Setup Time       Value: 03/06/2013 01:26     Performed at [facility]       Value: 85,000 COLONIES/ML     Performed at [facility]   Culture       Value: [place]     Performed at [facility]   Report Status PENDING    POTASSIUM     Status: Abnormal   Collection Time    03/05/13  7:15 PM      Result Value Ref Range   Potassium 6.5 (*) 3.7 - 5.3 mEq/L   Comment: MODERATE HEMOLYSIS  HEMOLYSIS AT THIS LEVEL MAY AFFECT RESULT     CRITICAL RESULT CALLED TO, READ BACK BY AND VERIFIED WITH:     DOSTER RN 2008 03/05/13 A NAVARRO  POTASSIUM     Status: Abnormal   Collection Time    03/05/13  8:50 PM      Result Value Ref Range   Potassium 6.3 (*) 3.7 - 5.3 mEq/L  LACTIC ACID, PLASMA     Status: None   Collection Time    03/05/13 10:25 PM      Result Value Ref Range   Lactic Acid, Venous 1.2  0.5 - 2.2 mmol/L  POTASSIUM     Status: None   Collection Time    03/06/13 12:40 AM      Result Value Ref Range   Potassium 4.7  3.7 - 5.3 mEq/L   Comment: DELTA CHECK NOTED  BASIC METABOLIC PANEL     Status: Abnormal   Collection Time    03/06/13  4:53 AM       Result Value Ref Range   Sodium 128 (*) 137 - 147 mEq/L   Potassium 4.5  3.7 - 5.3 mEq/L   Chloride 95 (*) 96 - 112 mEq/L   CO2 19  19 - 32 mEq/L   Glucose, Bld 126 (*) 70 - 99 mg/dL   BUN 56 (*) 6 - 23 mg/dL   Creatinine, Ser 1.32 (*) 0.50 - 1.10 mg/dL   Calcium 9.5  8.4 - 10.5 mg/dL   GFR calc non Af Amer 37 (*) >90 mL/min   GFR calc Af Amer 43 (*) >90 mL/min   Comment: (NOTE)     The eGFR has been calculated using the CKD EPI equation.     This calculation has not been validated in all clinical situations.     eGFR's persistently <90 mL/min signify possible Chronic Kidney     Disease.  BASIC METABOLIC PANEL     Status: Abnormal   Collection Time    03/07/13  5:08 AM      Result Value Ref Range   Sodium 132 (*) 137 - 147 mEq/L   Potassium 4.2  3.7 - 5.3 mEq/L   Chloride 92 (*) 96 - 112 mEq/L   CO2 33 (*) 19 - 32 mEq/L   Glucose, Bld 101 (*) 70 - 99 mg/dL   BUN 35 (*) 6 - 23 mg/dL   Comment: DELTA CHECK NOTED     REPEATED TO VERIFY   Creatinine, Ser 0.99  0.50 - 1.10 mg/dL   Calcium 9.2  8.4 - 10.5 mg/dL   GFR calc non Af Amer 52 (*) >90 mL/min   GFR calc Af Amer 60 (*) >90 mL/min   Comment: (NOTE)     The eGFR has been calculated using the CKD EPI equation.     This calculation has not been validated in all clinical situations.     eGFR's persistently <90 mL/min signify possible Chronic Kidney     Disease.    Studies/Results: Dg Chest 2 View  03/05/2013   CLINICAL DATA:  Nausea, loss of appetite, history of colon cancer and hypertension.  EXAM: CHEST  2 VIEW  COMPARISON:  December 11, 2012  FINDINGS: The heart size and mediastinal contours are stable. Cardiac pacemaker is unchanged. There is no focal infiltrate, pulmonary edema, or pleural effusion. Vessel on end versus calcified granuloma is identified in the medial right lung base unchanged compared to prior exam. The visualized skeletal structures are stable.  IMPRESSION:  No active cardiopulmonary disease.    Electronically Signed   By: Abelardo Diesel M.D.   On: 03/05/2013 18:26   Medications: Scheduled Meds: . cefTRIAXone (ROCEPHIN)  IV  1 g Intravenous Q24H  . cholecalciferol  2,000 Units Oral Daily  . citalopram  10 mg Oral Daily  . enoxaparin (LOVENOX) injection  30 mg Subcutaneous Q24H  . feeding supplement (ENSURE COMPLETE)  237 mL Oral BID BM  . feeding supplement (ENSURE)  1 Container Oral Q24H  . FLORA-Q  1 capsule Oral Daily  . multivitamin with minerals  1 tablet Oral Daily  . sodium chloride  3 mL Intravenous Q12H  . vitamin B-12  100 mcg Oral Daily   Continuous Infusions: . 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 Pearletha Alfred, currently not on chemotherapy  Hyponatremia - improved on current infusion of D5 with sodium bicarbonate, reduced to 75 cc an hour and we'll plan to discontinue this afternoon at 1 PM and check basic metabolic profile in a.m. encourage by mouth fluids Dehydration, severe - resolved, oropharynx moist  Hyperkalemia - resolved  Acute kidney injury - improving   UTI (urinary tract infection) - continue on Rocephin, culture pending  Disposition - hope is to send home in a.m.. We'll have her ambulate today and advance diet.                      We'll continue with advanced home care for ostomy nurse and physical therapy Protein-calorie malnutrition, severe - nutrition consult appreciated    LOS: 2 days   Henrine Screws, MD 03/07/2013, 7:52 AM

## 2013-03-08 LAB — BASIC METABOLIC PANEL
BUN: 24 mg/dL — AB (ref 6–23)
CHLORIDE: 93 meq/L — AB (ref 96–112)
CO2: 33 meq/L — AB (ref 19–32)
Calcium: 9.4 mg/dL (ref 8.4–10.5)
Creatinine, Ser: 0.9 mg/dL (ref 0.50–1.10)
GFR calc non Af Amer: 58 mL/min — ABNORMAL LOW (ref >90)
GFR, EST AFRICAN AMERICAN: 68 mL/min — AB (ref >90)
GLUCOSE: 95 mg/dL (ref 70–99)
POTASSIUM: 4.5 meq/L (ref 3.7–5.3)
Sodium: 132 mEq/L — ABNORMAL LOW (ref 137–147)

## 2013-03-08 LAB — CBC WITH DIFFERENTIAL/PLATELET
BASOS PCT: 1 % (ref 0–1)
Basophils Absolute: 0.1 10*3/uL (ref 0.0–0.1)
Eosinophils Absolute: 0.4 10*3/uL (ref 0.0–0.7)
Eosinophils Relative: 6 % — ABNORMAL HIGH (ref 0–5)
HCT: 29.6 % — ABNORMAL LOW (ref 36.0–46.0)
HEMOGLOBIN: 10.1 g/dL — AB (ref 12.0–15.0)
Lymphocytes Relative: 24 % (ref 12–46)
Lymphs Abs: 1.4 10*3/uL (ref 0.7–4.0)
MCH: 30.5 pg (ref 26.0–34.0)
MCHC: 34.1 g/dL (ref 30.0–36.0)
MCV: 89.4 fL (ref 78.0–100.0)
MONOS PCT: 18 % — AB (ref 3–12)
Monocytes Absolute: 1.1 10*3/uL — ABNORMAL HIGH (ref 0.1–1.0)
NEUTROS ABS: 3.2 10*3/uL (ref 1.7–7.7)
NEUTROS PCT: 52 % (ref 43–77)
Platelets: 159 10*3/uL (ref 150–400)
RBC: 3.31 MIL/uL — AB (ref 3.87–5.11)
RDW: 16.2 % — ABNORMAL HIGH (ref 11.5–15.5)
WBC: 6.1 10*3/uL (ref 4.0–10.5)

## 2013-03-08 NOTE — Discharge Summary (Signed)
Physician Discharge Summary  NAME:Tamara [name]  DDU:[id number]  DOB: 1929/04/03   Admit date: 03/05/2013 Discharge date: 03/08/2013  Discharge Diagnoses:   Principal Problem:  Metabolic acidosis - resolved nicely with bicarbonate infusion  Active Problems:  Pacemaker implanted 08/28/12  Essential hypertension, benign - controlled , continue to remain off lisinopril for now  Colon cancer - status post partial colectomy with formation of ostomy for transverse colon adenocarcinoma - followed by Dr. Pearletha Alfred, currently not on chemotherapy - appreciate oncology consultation yesterday Hyponatremia - resolved with intravenous replacement therapy Dehydration, severe - resolved, oropharynx moist  Hyperkalemia - resolved  Acute kidney injury - resolved UTI (urinary tract infection) - resolved on Rocephin therapy for 3 days  Disposition - transfer to [facility] today for PT/OT Protein-calorie malnutrition, severe - continue nutritional supplements  Discharge Physical Exam:  General Appearance: Alert, cooperative, no distress, appears stated age  Weight change:   Intake/Output Summary (Last 24 hours) at 03/08/13 0745 Last data filed at 03/08/13 2376  Gross per 24 hour  Intake   1366 ml  Output   2300 ml  Net   -934 ml   Filed Vitals:   03/07/13 1145 03/07/13 1403 03/07/13 2038 03/08/13 0430  BP: 116/46 136/52 143/59 141/56  Pulse: 89 78 77 86  Temp: 98 F (36.7 C) 98.3 F (36.8 C) 97.5 F (36.4 C) 97.6 F (36.4 C)  TempSrc: Oral Oral Oral Oral  Resp: 16 16 16 18   Height:      Weight:      SpO2: 99% 100% 99% 99%    General Appearance: Alert, cooperative, no distress, appears stated age  HEENT: Oropharynx is moist, tongue is normal size and well papillated  Lungs: Clear to auscultation bilaterally, respirations unlabored  Heart: Regular rate and rhythm, S1 and S2 normal, no murmur, rub or gallop  Abdomen: Soft, non-tender, bowel sounds active all four quadrants, no masses, no  organomegaly. 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 Patient also had severe protein calorie malnutrition and deconditioning. Patient was discharged home from skilled nursing facility about one week prior to this admission. Since she went back home patient has had increased nausea with poor appetite and generalized weakness. She denies change in ileostomy output. She presented with a metabolic acidosis and dehydration with hyponatremia, hyperkalemia, and hypochloremia. This has resolved with a sodium bicarbonate infusion and she is now off IV fluids for approximately 24 hours and is taking by mouth well and electrolytes have normalized. Acute renal insufficiency has resolved as well the patient is still quite weak and will need physical therapy and occupational therapy.  Consultation from nutrition appreciated  Things to follow up in the outpatient setting: Maintaining adequate Oral intake and continued physical conditioning and adequate ostomy care  Consults: Treatment Team:   Volanda Napoleon, MD / Sharene Butters, PA-C - Oncology  Disposition: 03-Skilled [place]  Discharge Orders   Future Appointments Provider Department  Dept Phone   04/18/2013 9:15 AM [place] [phone]   04/18/2013 10:00 AM [facility] [phone]   Please pick up your oral contrast prep at your scheduled location at least 1 day prior to your appointment. Liquids only 4 hours prior to your exam. Any medications can be taken as usual. Please arrive 15 min prior to your scheduled exam time.   04/18/2013 10:30 AM [facility] 1 [facility] [phone]   Liquids only 4 hours prior to your exam. Any medications can be taken as usual. Please arrive 15 min prior to your scheduled exam time.   04/18/2013 11:30 AM Volanda Napoleon, MD [place] [phone]   Future Orders Complete By Expires   Call MD for:  difficulty breathing, headache or visual disturbances  As directed    Call MD for:  persistant dizziness or light-headedness  As directed    Call MD for:  persistant nausea and vomiting  As directed    Call MD for:  severe uncontrolled pain  As directed    Call MD for:  temperature >100.4  As directed    Diet - low sodium heart healthy  As directed    Increase activity slowly  As directed        Medication List    STOP taking these medications       lisinopril 10 MG tablet  Commonly known as:  PRINIVIL,ZESTRIL      TAKE these medications       acetaminophen 325 MG tablet  Commonly known as:  TYLENOL  Take 2 tablets (650 mg total) by mouth every 4 (four) hours as needed.     ALIGN PO  Take 1 capsule by mouth daily with breakfast.     antioxidant formula SG capsule  Take 1 capsule by mouth daily.     citalopram 10 MG tablet  Commonly known as:  CELEXA  Take 1 tablet (10 mg total) by mouth daily.     diphenoxylate-atropine 2.5-0.025 MG per tablet  Commonly known as:  LOMOTIL  Take one tablet by mouth once daily     feeding supplement (ENSURE COMPLETE) Liqd  Take 237 mLs by mouth 3 (three) times daily between meals.     LORazepam 0.5 MG tablet  Commonly known as:  ATIVAN  Take 0.5 mg by mouth every 8 (eight)  hours as needed for anxiety.     oxyCODONE 5 MG immediate release tablet  Commonly known as:  Oxy IR/ROXICODONE  Take 5 mg by mouth every 6 (six) hours as needed for severe pain.     simethicone 80 MG chewable tablet  Commonly known as:  MYLICON  Chew 1 tablet (80 mg total) by mouth every 6 (six) hours as needed for flatulence (1 to 2 tablets).     SUPER B COMPLEX PO  Take by mouth every other day.     VITAMIN B-12 PO  Take by mouth every other day.     Vitamin D 2000 UNITS tablet  Take 2,000 Units by mouth daily with breakfast.         The results of significant diagnostics from this hospitalization (including imaging, microbiology, ancillary and laboratory) are listed below for reference.    Significant Diagnostic Studies: Dg Chest 2 View  03/05/2013   CLINICAL DATA:  Nausea, loss of appetite, history of colon cancer  and hypertension.  EXAM: CHEST  2 VIEW  COMPARISON:  December 11, 2012  FINDINGS: The heart size and mediastinal contours are stable. Cardiac pacemaker is unchanged. There is no focal infiltrate, pulmonary edema, or pleural effusion. Vessel on end versus calcified granuloma is identified in the medial right lung base unchanged compared to prior exam. The visualized skeletal structures are stable.  IMPRESSION: No active cardiopulmonary disease.   Electronically Signed   By: Abelardo Diesel M.D.   On: 03/05/2013 18:26    Microbiology: Recent Results (from the past 240 hour(s))  URINE CULTURE     Status: None   Collection Time    03/05/13  6:43 PM      Result Value Ref Range Status   Specimen Description URINE, RANDOM   Final   Special Requests NONE   Final   Culture  Setup Time     Final   Value: 03/06/2013 01:26     Performed at [place]     Final   Value: 85,000 COLONIES/ML     Performed at [facility]   Culture     Final   Value: KLEBSIELLA PNEUMONIAE     Performed at [facility]   Report Status 03/07/2013 FINAL    Final   Organism ID, Bacteria KLEBSIELLA PNEUMONIAE   Final     Labs: Results for orders placed during the hospital encounter of 03/05/13  URINE CULTURE      Result Value Ref Range   Specimen Description URINE, RANDOM     Special Requests NONE     Culture  Setup Time       Value: 03/06/2013 01:26     Performed at [facility]       Value: 85,000 COLONIES/ML     Performed at [facility]   Culture       Value: KLEBSIELLA PNEUMONIAE     Performed at [facility]   Report Status 03/07/2013 FINAL     Organism ID, Bacteria KLEBSIELLA PNEUMONIAE    CBC WITH DIFFERENTIAL      Result Value Ref Range   WBC 10.7 (*) 4.0 - 10.5 K/uL   RBC 4.13  3.87 - 5.11 MIL/uL   Hemoglobin 13.0  12.0 - 15.0 g/dL   HCT 36.3  36.0 - 46.0 %   MCV 87.9  78.0 - 100.0 fL   MCH 31.5  26.0 - 34.0 pg   MCHC 35.8  30.0 - 36.0 g/dL   RDW 16.8 (*) 11.5 - 15.5 %   Platelets 210  150 - 400 K/uL   Neutrophils Relative % 67  43 - 77 %   Neutro Abs 7.2  1.7 - 7.7 K/uL   Lymphocytes Relative 23  12 - 46 %   Lymphs Abs 2.5  0.7 - 4.0 K/uL   Monocytes Relative 7  3 - 12 %   Monocytes Absolute 0.8  0.1 - 1.0 K/uL   Eosinophils Relative 2  0 - 5 %   Eosinophils Absolute 0.2  0.0 - 0.7 K/uL   Basophils Relative 1  0 - 1 %   Basophils Absolute 0.1  0.0 - 0.1 K/uL  COMPREHENSIVE METABOLIC PANEL      Result Value Ref Range   Sodium 122 (*) 137 - 147 mEq/L   Potassium 6.6 (*) 3.7 - 5.3 mEq/L   Chloride 94 (*) 96 - 112 mEq/L   CO2 12 (*) 19 -  32 mEq/L   Glucose, Bld 111 (*) 70 - 99 mg/dL   BUN 69 (*) 6 - 23 mg/dL   Creatinine, Ser 1.46 (*) 0.50 - 1.10 mg/dL   Calcium 10.9 (*) 8.4 - 10.5 mg/dL   Total Protein 8.4 (*) 6.0 - 8.3 g/dL   Albumin 4.1  3.5 - 5.2 g/dL   AST 24  0 - 37 U/L   ALT 26  0 - 35 U/L   Alkaline Phosphatase 124 (*) 39 - 117 U/L   Total Bilirubin 0.5  0.3 - 1.2 mg/dL   GFR calc non Af Amer 33 (*) >90 mL/min   GFR calc Af Amer 38 (*) >90 mL/min  LIPASE,  BLOOD      Result Value Ref Range   Lipase 98 (*) 11 - 59 U/L  URINALYSIS W MICROSCOPIC + REFLEX CULTURE      Result Value Ref Range   Color, Urine YELLOW  YELLOW   APPearance CLEAR  CLEAR   Specific Gravity, Urine 1.020  1.005 - 1.030   pH 5.5  5.0 - 8.0   Glucose, UA NEGATIVE  NEGATIVE mg/dL   Hgb urine dipstick NEGATIVE  NEGATIVE   Bilirubin Urine NEGATIVE  NEGATIVE   Ketones, ur NEGATIVE  NEGATIVE mg/dL   Protein, ur NEGATIVE  NEGATIVE mg/dL   Urobilinogen, UA 0.2  0.0 - 1.0 mg/dL   Nitrite NEGATIVE  NEGATIVE   Leukocytes, UA MODERATE (*) NEGATIVE   WBC, UA 7-10  <3 WBC/hpf   RBC / HPF 0-2  <3 RBC/hpf   Bacteria, UA FEW (*) RARE   Casts HYALINE CASTS (*) NEGATIVE  TROPONIN I      Result Value Ref Range   Troponin I <0.30  <0.30 ng/mL  POTASSIUM      Result Value Ref Range   Potassium 6.5 (*) 3.7 - 5.3 mEq/L  LACTIC ACID, PLASMA      Result Value Ref Range   Lactic Acid, Venous 1.2  0.5 - 2.2 mmol/L  POTASSIUM      Result Value Ref Range   Potassium 6.3 (*) 3.7 - 5.3 mEq/L  BASIC METABOLIC PANEL      Result Value Ref Range   Sodium 128 (*) 137 - 147 mEq/L   Potassium 4.5  3.7 - 5.3 mEq/L   Chloride 95 (*) 96 - 112 mEq/L   CO2 19  19 - 32 mEq/L   Glucose, Bld 126 (*) 70 - 99 mg/dL   BUN 56 (*) 6 - 23 mg/dL   Creatinine, Ser 1.32 (*) 0.50 - 1.10 mg/dL   Calcium 9.5  8.4 - 10.5 mg/dL   GFR calc non Af Amer 37 (*) >90 mL/min   GFR calc Af Amer 43 (*) >90 mL/min  POTASSIUM      Result Value Ref Range   Potassium 4.7  3.7 - 5.3 mEq/L  BASIC METABOLIC PANEL      Result Value Ref Range   Sodium 132 (*) 137 - 147 mEq/L   Potassium 4.2  3.7 - 5.3 mEq/L   Chloride 92 (*) 96 - 112 mEq/L   CO2 33 (*) 19 - 32 mEq/L   Glucose, Bld 101 (*) 70 - 99 mg/dL   BUN 35 (*) 6 - 23 mg/dL   Creatinine, Ser 0.99  0.50 - 1.10 mg/dL   Calcium 9.2  8.4 - 10.5 mg/dL   GFR calc non Af Amer 52 (*) >90 mL/min   GFR calc Af Amer 60 (*) >90 mL/min  BASIC METABOLIC PANEL      Result Value  Ref Range   Sodium 132 (*) 137 - 147 mEq/L   Potassium 4.5  3.7 - 5.3 mEq/L   Chloride 93 (*) 96 - 112 mEq/L   CO2 33 (*) 19 - 32 mEq/L   Glucose, Bld 95  70 - 99 mg/dL   BUN 24 (*) 6 - 23 mg/dL   Creatinine, Ser 0.90  0.50 - 1.10 mg/dL   Calcium 9.4  8.4 - 10.5 mg/dL   GFR calc non Af Amer 58 (*) >90 mL/min   GFR calc Af Amer 68 (*) >90 mL/min  CBC WITH DIFFERENTIAL      Result Value Ref Range   WBC 6.1  4.0 - 10.5 K/uL   RBC 3.31 (*) 3.87 - 5.11 MIL/uL   Hemoglobin 10.1 (*) 12.0 - 15.0 g/dL   HCT 29.6 (*) 36.0 - 46.0 %   MCV 89.4  78.0 - 100.0 fL   MCH 30.5  26.0 - 34.0 pg   MCHC 34.1  30.0 - 36.0 g/dL   RDW 16.2 (*) 11.5 - 15.5 %   Platelets 159  150 - 400 K/uL   Neutrophils Relative % 52  43 - 77 %   Neutro Abs 3.2  1.7 - 7.7 K/uL   Lymphocytes Relative 24  12 - 46 %   Lymphs Abs 1.4  0.7 - 4.0 K/uL   Monocytes Relative 18 (*) 3 - 12 %   Monocytes Absolute 1.1 (*) 0.1 - 1.0 K/uL   Eosinophils Relative 6 (*) 0 - 5 %   Eosinophils Absolute 0.4  0.0 - 0.7 K/uL   Basophils Relative 1  0 - 1 %   Basophils Absolute 0.1  0.0 - 0.1 K/uL    Time coordinating discharge: 35 minutes  Signed: Henrine Screws, MD 03/08/2013, 7:45 AM

## 2013-03-08 NOTE — Progress Notes (Signed)
Clinical Social Work Department BRIEF PSYCHOSOCIAL ASSESSMENT 03/08/2013  Patient:  Tamara [name], Tamara [name]     Account Number:  [id number]     Admit date:  03/05/2013  Clinical Social Worker:  Venia Minks  Date/Time:  03/08/2013 12:00 M  Referred by:  RN  Date Referred:  03/08/2013 Referred for  SNF Placement   Other Referral:   Interview type:  Patient Other interview type:   family and MD    PSYCHOSOCIAL DATA Living Status:  FAMILY Admitted from facility:   Level of care:   Primary support name:  Barbaraann Boys Primary support relationship to patient:  SPOUSE Degree of support available:   excellent    CURRENT CONCERNS Current Concerns  Post-Acute Placement   Other Concerns:    SOCIAL WORK ASSESSMENT / PLAN CSW arrived on unit and was told MD discharged patient to [facility]. However, CSW had never been consulted and physical therapy recommended Home Health. CSW met with patient. Patient is alert and oriented X3. Patient was not aware of any arrangements for [facility] and she intended to go home. She states she was recently at [facility] for rehab but was discharged home. She lives at home with her spouse and her neice is visiting from [place] for the next two and a half weeks. She states that her sister is also coming to town today. CSW asked where the [facility] arrangements had come from. She asked CSW to call her spouse. CSW called spouse while in the room with patient. Spouse states that he has made arrangements at [facility] and that they are waiting for her and have a room ready. CSW explained that patient would like to go home. He states that he can not take care of her. Patient asked to speak with him. Patient became very tearful on the phone with her spouse and was repeatedly saying to him that she just wants to go home. After she hung up with spouse, she cried. CSW explained that she has the right to make decisions for herself and whatever she chooses to do is what  CSW will do. Patient states she is upset as she feels like everyone talked around her and she wasnt included in decision. CSW attempted to empower patient by reminding her that the decision is hers. She states that she is going to go home and she will talk with Dr. Inda Merlin if she has to. CSW called Dr. Inda Merlin. He states that he, the spouse, and niece are all in agreement that patient will go to [facility]. CSW explained that patient is refusing and wants to go home. He states patient has to go. CSW explained that unless he finds patient incompetent, CSW can not send patient somewhere that she does not want to go. He states that he is on his way to [place] and if patient goes to snf, someone from the office can sign FL2.  Patient's spouse and niece came to hospital. CSW met with them in hallway. CSW explained to them the same thing CSW explained to Dr. Inda Merlin, at the end of all this it is the patient's decision. CSW understands concerns and listened and offered emotional support, however, CSW encouraged them to talk with patient and see if they can convince her. They state that patients sister is coming up and she has the most influence.  patients sister arrived on unit. She spoke with CSW in hallway. CSW states that part of patient being upset is feeling excluded so CSW would be more comfortable if we spoke  with patient in room. Sister, CSW and patient all met together. Sister spoke with patient and was able to convince her to go to [place]. CSW explained that CSW will do whatever patient wants CSW to do. Patient is concerned that if she goes, she wont be able to leave. CSW states that she can leave at any time. patient is agreeable.  CSW called Dr. Inda Merlin and informed him of same.  CSW faxed FL2 to office and it was faxed back.  [facility] is ready for patient.   Assessment/plan status:   Other assessment/ plan:   Information/referral to community resources:    PATIENT'S/FAMILY'S RESPONSE TO PLAN OF  CARE: please see note above. Patient is reluctantly agreeable to snf placement at [facility].

## 2013-03-08 NOTE — Progress Notes (Signed)
Pt discharged to [facility] via ambulance. Pt is alert and oriented X4. Discharge instruction reviewed with patient and report called to [place] at [facility].

## 2013-03-08 NOTE — Progress Notes (Signed)
Patient cleared for discharge. Packet copied and placed in [place]. Spouse wants patient to go by [facility]. He understands that it will likely not be covered due to patients physical capabilities. [facility] called for transportation.  Praise Stennett C. Emmet MSW, [place]

## 2013-03-08 NOTE — Progress Notes (Signed)
Clinical Social Work Department CLINICAL SOCIAL WORK PLACEMENT NOTE 03/08/2013  Patient:  Tamara [name], Tamara [name]  Account Number:  [id number] Admit date:  03/05/2013  Clinical Social Worker:  Caren Hazy, LCSW  Date/time:  03/08/2013 12:00 M  Clinical Social Work is seeking post-discharge placement for this patient at the following level of care:   [place]   (*CSW will update this form in Epic as items are completed)   03/08/2013  Patient/family provided with [facility] Department of Clinical Social Work's list of facilities offering this level of care within the geographic area requested by the patient (or if unable, by the patient's family).  03/08/2013  Patient/family informed of their freedom to choose among providers that offer the needed level of care, that participate in Medicare, Medicaid or managed care program needed by the patient, have an available bed and are willing to accept the patient.  03/08/2013  Patient/family informed of [facility]' ownership interest in [facility], as well as of the fact that they are under no obligation to receive care at this facility.  PASARR submitted to EDS on 03/08/2013 PASARR number received from EDS on 03/08/2013  FL2 transmitted to all facilities in geographic area requested by pt/family on  03/08/2013 FL2 transmitted to all facilities within larger geographic area on 03/08/2013  Patient informed that his/her managed care company has contracts with or will negotiate with  certain facilities, including the following:     Patient/family informed of bed offers received:  03/08/2013 Patient chooses bed at [facility] Physician recommends and patient chooses bed at    Patient to be transferred to [facility] on  03/08/2013 Patient to be transferred to facility by [facility]  The following physician request were entered in Epic:   Additional Comments:

## 2013-03-09 DIAGNOSIS — C785 Secondary malignant neoplasm of large intestine and rectum: Secondary | ICD-10-CM | POA: Diagnosis not present

## 2013-03-09 DIAGNOSIS — Z950 Presence of cardiac pacemaker: Secondary | ICD-10-CM | POA: Diagnosis not present

## 2013-03-09 DIAGNOSIS — I10 Essential (primary) hypertension: Secondary | ICD-10-CM | POA: Diagnosis not present

## 2013-03-09 DIAGNOSIS — M6281 Muscle weakness (generalized): Secondary | ICD-10-CM | POA: Diagnosis not present

## 2013-03-09 DIAGNOSIS — R269 Unspecified abnormalities of gait and mobility: Secondary | ICD-10-CM | POA: Diagnosis not present

## 2013-03-09 DIAGNOSIS — I2510 Atherosclerotic heart disease of native coronary artery without angina pectoris: Secondary | ICD-10-CM | POA: Diagnosis not present

## 2013-03-09 DIAGNOSIS — E43 Unspecified severe protein-calorie malnutrition: Secondary | ICD-10-CM | POA: Diagnosis not present

## 2013-03-09 DIAGNOSIS — K5289 Other specified noninfective gastroenteritis and colitis: Secondary | ICD-10-CM | POA: Diagnosis not present

## 2013-03-09 DIAGNOSIS — Z5189 Encounter for other specified aftercare: Secondary | ICD-10-CM | POA: Diagnosis not present

## 2013-03-09 DIAGNOSIS — R279 Unspecified lack of coordination: Secondary | ICD-10-CM | POA: Diagnosis not present

## 2013-03-09 DIAGNOSIS — C50919 Malignant neoplasm of unspecified site of unspecified female breast: Secondary | ICD-10-CM | POA: Diagnosis not present

## 2013-03-09 DIAGNOSIS — N390 Urinary tract infection, site not specified: Secondary | ICD-10-CM | POA: Diagnosis not present

## 2013-03-12 ENCOUNTER — Encounter: Payer: Self-pay | Admitting: Internal Medicine

## 2013-03-12 DIAGNOSIS — D62 Acute posthemorrhagic anemia: Secondary | ICD-10-CM | POA: Insufficient documentation

## 2013-03-12 DIAGNOSIS — Z5189 Encounter for other specified aftercare: Secondary | ICD-10-CM | POA: Diagnosis not present

## 2013-03-12 DIAGNOSIS — R279 Unspecified lack of coordination: Secondary | ICD-10-CM | POA: Diagnosis not present

## 2013-03-12 DIAGNOSIS — M6281 Muscle weakness (generalized): Secondary | ICD-10-CM | POA: Diagnosis not present

## 2013-03-12 DIAGNOSIS — I10 Essential (primary) hypertension: Secondary | ICD-10-CM | POA: Diagnosis not present

## 2013-03-12 DIAGNOSIS — N390 Urinary tract infection, site not specified: Secondary | ICD-10-CM | POA: Diagnosis not present

## 2013-03-12 DIAGNOSIS — R269 Unspecified abnormalities of gait and mobility: Secondary | ICD-10-CM | POA: Diagnosis not present

## 2013-03-12 NOTE — Progress Notes (Signed)
Patient ID: Tamara [name], female   DOB: 1929-02-25, 80 y.o.   MRN: [id number]               HISTORY & PHYSICAL  DATE: 01/03/2013     FACILITY: [facility]  LEVEL OF CARE: SNF (31)  ALLERGIES:  Allergies  Allergen Reactions  . Codeine Nausea And Vomiting  . Zofran [Ondansetron Hcl]     CHIEF COMPLAINT:  Manage adenocarcinoma of the colon, hypertension, and acute blood loss anemia.     HISTORY OF PRESENT ILLNESS:   The patient is an 80 year-old, Caucasian female who was hospitalized secondary to GI bleed.  After hospitalization, she is admitted to this facility for short-term rehabilitation.    COLONIC ADENOCARCINOMA:  This was found as a colonic mass in the transverse colon.  She was treated with resection and re-anastomosis of ileus.  She denies further GI bleeding, abdominal pain, nausea or vomiting.    ANEMIA:  Postoperatively, patient suffered acute blood loss.   The anemia has been stable. The patient denies fatigue, melena or hematochezia.  The patient is currently not on iron.   Last hemoglobins are:    8.9, 9.5, 10.2.    HTN: Pt 's HTN remains stable.  Denies CP, sob, DOE, pedal edema, headaches, dizziness or visual disturbances.  No complications from the medications currently being used.  Last BP :   136/73.    PAST MEDICAL HISTORY :  Past Medical History  Diagnosis Date  . Breast cancer   . HTN (hypertension) August 2014  . Syncope and collapse August 2014    sinus pauses/ PTVDP  . Colitis   . Colon cancer 02/21/2013    PAST SURGICAL HISTORY: Past Surgical History  Procedure Laterality Date  . Breast lumpectomy    . Cholecystectomy    . Tonsillectomy    . Pacemaker insertion  08/28/12    MDT  . Flexible sigmoidoscopy N/A 12/06/2012    Procedure: FLEXIBLE SIGMOIDOSCOPY;  Surgeon: Lear Ng, MD;  Location: [place];  Service: Endoscopy;  Laterality: N/A;  . Laparoscopic partial colectomy N/A 12/07/2012    Procedure:  LAPAROSCOPIC-ASSISTED TRANSVERSE COLECTOMY;  Surgeon: Joyice Faster. Cornett, MD;  Location: [place];  Service: General;  Laterality: N/A;  . Colonoscopy N/A 12/15/2012    Procedure: COLONOSCOPY;  Surgeon: Jeryl Columbia, MD;  Location: [facility] ENDOSCOPY;  Service: Endoscopy;  Laterality: N/A;  . Bowel decompression N/A 12/15/2012    Procedure: BOWEL DECOMPRESSION;  Surgeon: Jeryl Columbia, MD;  Location: [facility] ENDOSCOPY;  Service: Endoscopy;  Laterality: N/A;  . Colon resection N/A 12/22/2012    Procedure: EXPLORATORY LAPAROTOMY , RIGHT Colectomy IILEOSTOMY;  Surgeon: Rolm Bookbinder, MD;  Location: [place];  Service: General;  Laterality: N/A;    SOCIAL HISTORY:  reports that she has never smoked. She has never used smokeless tobacco. She reports that she does not drink alcohol or use illicit drugs.  FAMILY HISTORY:  Family History  Problem Relation Age of Onset  . Alzheimer's disease Mother   . Renal Disease Father   . Coronary artery disease Brother     CURRENT MEDICATIONS: Reviewed per [facility]  REVIEW OF SYSTEMS:  See HPI otherwise 14 point ROS is negative.  PHYSICAL EXAMINATION  VS:  T 97.2       P 91      RR 19      BP 136/73      POX 98%  GENERAL: no acute distress, normal body habitus EYES: conjunctivae normal, sclerae normal, normal eye lids MOUTH/THROAT: lips without lesions,no lesions in the mouth,tongue is without lesions,uvula elevates in midline NECK: supple, trachea midline, no neck masses, no thyroid tenderness, no thyromegaly LYMPHATICS: no LAN in the neck, no supraclavicular LAN RESPIRATORY: breathing is even & unlabored, BS CTAB CARDIAC: RRR, no murmur,no extra heart sounds, no edema GI:  ABDOMEN: abdomen soft, normal BS, no masses, no tenderness, patient has right-sided colostomy, midline incision is clean   LIVER/SPLEEN: no hepatomegaly, no splenomegaly MUSCULOSKELETAL: HEAD: normal to inspection & palpation BACK: no kyphosis, scoliosis or spinal processes  tenderness EXTREMITIES: LEFT UPPER EXTREMITY: full range of motion, normal strength & tone RIGHT UPPER EXTREMITY:  full range of motion, normal strength & tone LEFT LOWER EXTREMITY:  full range of motion, normal strength & tone RIGHT LOWER EXTREMITY:  full range of motion, normal strength & tone PSYCHIATRIC: the patient is alert & oriented to person, affect & behavior appropriate  LABS/RADIOLOGY: Abdominal x-ray:  No acute findings.    CT of the abdomen and pelvis:  No acute findings.    Chest x-ray:  No acute disease.     MRSA by PCR negative.     Staph aureus by PCR negative.    C.difficile by PCR negative.    Hemoglobin 10.2, MCV 90.4, otherwise CBC normal.    AST 43, ALT 39, otherwise CMP normal.    CEA 1.3.    Magnesium 2, phosphorus 2.4.    Labs reviewed: Basic Metabolic Panel:  Recent Labs  12/21/12 0600  12/25/12 0435  12/28/12 0500  03/06/13 0453 03/07/13 0508 03/08/13 0518  NA 130*  < > 129*  < > 132*  < > 128* 132* 132*  K 4.4  < > 4.3  < > 4.2  < > 4.5 4.2 4.5  CL 99  < > 98  < > 99  < > 95* 92* 93*  CO2 23  < > 25  < > 26  < > 19 33* 33*  GLUCOSE 110*  < > 112*  < > 113*  < > 126* 101* 95  BUN 18  < > 19  < > 14  < > 56* 35* 24*  CREATININE 0.46*  < > 0.42*  < > 0.46*  < > 1.32* 0.99 0.90  CALCIUM 8.9  < > 8.7  < > 9.0  < > 9.5 9.2 9.4  MG 2.1  --  1.9  --  2.0  --   --   --   --   PHOS 3.2  --  2.9  --  3.3  --   --   --   --   < > = values in this interval not displayed. Liver Function Tests:  Recent Labs  12/28/12 0500 02/16/13 1527 03/05/13 1730  AST 46* 19 24  ALT 48* 23 26  ALKPHOS 138* 90 124*  BILITOT 0.2* 0.5 0.5  PROT 5.4* 7.2 8.4*  ALBUMIN 2.1* 4.2 4.1    Recent Labs  11/26/12 1352 03/05/13 1730  LIPASE 31 98*   CBC:  Recent Labs  02/16/13 1527 03/05/13 1730 03/08/13 0518  WBC 8.4 10.7* 6.1  NEUTROABS 4.8 7.2 3.2  HGB 11.0* 13.0 10.1*  HCT 32.3* 36.3 29.6*  MCV 91 87.9 89.4  PLT 136* 210 159   Cardiac  Enzymes:  Recent Labs  11/26/12 1352 11/26/12 1813 03/05/13 1730  TROPONINI <0.30 <0.30 <0.30   CBG:  Recent  Labs  12/16/12 0556 12/16/12 0641 12/22/12 2039  GLUCAP 127* 122* 135*    ASSESSMENT/PLAN:  Colonic adenocarcinoma.  Status post resection and re-anastomosis of ileus.  Continue rehabilitation.  Acute blood loss anemia.  Reassess hemoglobin level.      Hypertension.  Well controlled.    Depression.  Continue Celexa.    Check CBC and BMP.    THN Metrics:   Nonsmoker.  Not on aspirin.    I have reviewed patient's medical records received at admission/from hospitalization.  CPT CODE: 39030    Gayani Y Dasanayaka, [place] [phone]

## 2013-03-14 DIAGNOSIS — E559 Vitamin D deficiency, unspecified: Secondary | ICD-10-CM | POA: Diagnosis not present

## 2013-03-14 DIAGNOSIS — I10 Essential (primary) hypertension: Secondary | ICD-10-CM | POA: Diagnosis not present

## 2013-03-14 DIAGNOSIS — R269 Unspecified abnormalities of gait and mobility: Secondary | ICD-10-CM | POA: Diagnosis not present

## 2013-03-14 DIAGNOSIS — M6281 Muscle weakness (generalized): Secondary | ICD-10-CM | POA: Diagnosis not present

## 2013-03-14 DIAGNOSIS — E43 Unspecified severe protein-calorie malnutrition: Secondary | ICD-10-CM | POA: Diagnosis not present

## 2013-03-14 DIAGNOSIS — Z5189 Encounter for other specified aftercare: Secondary | ICD-10-CM | POA: Diagnosis not present

## 2013-03-14 DIAGNOSIS — R279 Unspecified lack of coordination: Secondary | ICD-10-CM | POA: Diagnosis not present

## 2013-03-14 DIAGNOSIS — R5383 Other fatigue: Secondary | ICD-10-CM | POA: Diagnosis not present

## 2013-03-14 DIAGNOSIS — R5381 Other malaise: Secondary | ICD-10-CM | POA: Diagnosis not present

## 2013-03-14 DIAGNOSIS — N390 Urinary tract infection, site not specified: Secondary | ICD-10-CM | POA: Diagnosis not present

## 2013-03-15 DIAGNOSIS — I10 Essential (primary) hypertension: Secondary | ICD-10-CM | POA: Diagnosis not present

## 2013-03-15 DIAGNOSIS — N390 Urinary tract infection, site not specified: Secondary | ICD-10-CM | POA: Diagnosis not present

## 2013-03-15 DIAGNOSIS — M6281 Muscle weakness (generalized): Secondary | ICD-10-CM | POA: Diagnosis not present

## 2013-03-15 DIAGNOSIS — Z5189 Encounter for other specified aftercare: Secondary | ICD-10-CM | POA: Diagnosis not present

## 2013-03-15 DIAGNOSIS — R269 Unspecified abnormalities of gait and mobility: Secondary | ICD-10-CM | POA: Diagnosis not present

## 2013-03-15 DIAGNOSIS — R279 Unspecified lack of coordination: Secondary | ICD-10-CM | POA: Diagnosis not present

## 2013-03-21 DIAGNOSIS — M6281 Muscle weakness (generalized): Secondary | ICD-10-CM | POA: Diagnosis not present

## 2013-03-21 DIAGNOSIS — N390 Urinary tract infection, site not specified: Secondary | ICD-10-CM | POA: Diagnosis not present

## 2013-03-21 DIAGNOSIS — Z5189 Encounter for other specified aftercare: Secondary | ICD-10-CM | POA: Diagnosis not present

## 2013-03-21 DIAGNOSIS — R279 Unspecified lack of coordination: Secondary | ICD-10-CM | POA: Diagnosis not present

## 2013-03-21 DIAGNOSIS — R269 Unspecified abnormalities of gait and mobility: Secondary | ICD-10-CM | POA: Diagnosis not present

## 2013-03-21 DIAGNOSIS — I10 Essential (primary) hypertension: Secondary | ICD-10-CM | POA: Diagnosis not present

## 2013-03-27 ENCOUNTER — Telehealth: Payer: Self-pay | Admitting: Hematology & Oncology

## 2013-03-27 DIAGNOSIS — E559 Vitamin D deficiency, unspecified: Secondary | ICD-10-CM | POA: Diagnosis not present

## 2013-03-27 DIAGNOSIS — D518 Other vitamin B12 deficiency anemias: Secondary | ICD-10-CM | POA: Diagnosis not present

## 2013-03-27 DIAGNOSIS — I2510 Atherosclerotic heart disease of native coronary artery without angina pectoris: Secondary | ICD-10-CM | POA: Diagnosis not present

## 2013-03-27 DIAGNOSIS — C189 Malignant neoplasm of colon, unspecified: Secondary | ICD-10-CM | POA: Diagnosis not present

## 2013-03-27 NOTE — Telephone Encounter (Signed)
Holly from [place] [facility] [facility] [place] called is aware of 3-25 appointments to get contrast and be npo 4 hrs

## 2013-03-30 ENCOUNTER — Other Ambulatory Visit: Payer: Medicare Other | Admitting: Lab

## 2013-03-30 ENCOUNTER — Ambulatory Visit: Payer: Medicare Other | Admitting: Hematology & Oncology

## 2013-04-04 DIAGNOSIS — R059 Cough, unspecified: Secondary | ICD-10-CM | POA: Diagnosis not present

## 2013-04-04 DIAGNOSIS — R05 Cough: Secondary | ICD-10-CM | POA: Diagnosis not present

## 2013-04-06 DIAGNOSIS — J811 Chronic pulmonary edema: Secondary | ICD-10-CM | POA: Diagnosis not present

## 2013-04-10 DIAGNOSIS — R05 Cough: Secondary | ICD-10-CM | POA: Diagnosis not present

## 2013-04-10 DIAGNOSIS — R059 Cough, unspecified: Secondary | ICD-10-CM | POA: Diagnosis not present

## 2013-04-10 DIAGNOSIS — J40 Bronchitis, not specified as acute or chronic: Secondary | ICD-10-CM | POA: Diagnosis not present

## 2013-04-18 ENCOUNTER — Encounter: Payer: Self-pay | Admitting: Hematology & Oncology

## 2013-04-18 ENCOUNTER — Ambulatory Visit (HOSPITAL_BASED_OUTPATIENT_CLINIC_OR_DEPARTMENT_OTHER): Payer: Medicare Other | Admitting: Hematology & Oncology

## 2013-04-18 ENCOUNTER — Ambulatory Visit (HOSPITAL_BASED_OUTPATIENT_CLINIC_OR_DEPARTMENT_OTHER)
Admission: RE | Admit: 2013-04-18 | Discharge: 2013-04-18 | Disposition: A | Discharge status: Home / Self Care | Payer: Medicare Other | Source: Ambulatory Visit | Attending: Hematology & Oncology | Admitting: Hematology & Oncology

## 2013-04-18 ENCOUNTER — Other Ambulatory Visit (HOSPITAL_BASED_OUTPATIENT_CLINIC_OR_DEPARTMENT_OTHER): Payer: Medicare Other | Admitting: Lab

## 2013-04-18 ENCOUNTER — Encounter (HOSPITAL_BASED_OUTPATIENT_CLINIC_OR_DEPARTMENT_OTHER): Payer: Self-pay

## 2013-04-18 VITALS — BP 141/54 | HR 79 | Temp 97.5°F | Resp 14 | Ht 64.0 in | Wt 128.0 lb

## 2013-04-18 DIAGNOSIS — C189 Malignant neoplasm of colon, unspecified: Secondary | ICD-10-CM

## 2013-04-18 DIAGNOSIS — C772 Secondary and unspecified malignant neoplasm of intra-abdominal lymph nodes: Secondary | ICD-10-CM | POA: Diagnosis not present

## 2013-04-18 DIAGNOSIS — C184 Malignant neoplasm of transverse colon: Secondary | ICD-10-CM

## 2013-04-18 DIAGNOSIS — Z9189 Other specified personal risk factors, not elsewhere classified: Secondary | ICD-10-CM

## 2013-04-18 DIAGNOSIS — Z789 Other specified health status: Secondary | ICD-10-CM | POA: Diagnosis not present

## 2013-04-18 LAB — CBC WITH DIFFERENTIAL (CANCER CENTER ONLY)
BASO#: 0 10*3/uL (ref 0.0–0.2)
BASO%: 0.5 % (ref 0.0–2.0)
EOS ABS: 0.1 10*3/uL (ref 0.0–0.5)
EOS%: 2 % (ref 0.0–7.0)
HCT: 35 % (ref 34.8–46.6)
HGB: 11.8 g/dL (ref 11.6–15.9)
LYMPH#: 1.7 10*3/uL (ref 0.9–3.3)
LYMPH%: 25.5 % (ref 14.0–48.0)
MCH: 33.1 pg (ref 26.0–34.0)
MCHC: 33.7 g/dL (ref 32.0–36.0)
MCV: 98 fL (ref 81–101)
MONO#: 0.7 10*3/uL (ref 0.1–0.9)
MONO%: 10.8 % (ref 0.0–13.0)
NEUT%: 61.2 % (ref 39.6–80.0)
NEUTROS ABS: 4.1 10*3/uL (ref 1.5–6.5)
PLATELETS: 214 10*3/uL (ref 145–400)
RBC: 3.56 10*6/uL — AB (ref 3.70–5.32)
RDW: 14.9 % (ref 11.1–15.7)
WBC: 6.7 10*3/uL (ref 3.9–10.0)

## 2013-04-18 LAB — CMP (CANCER CENTER ONLY)
ALT: 26 U/L (ref 10–47)
AST: 25 U/L (ref 11–38)
Albumin: 3.7 g/dL (ref 3.3–5.5)
Alkaline Phosphatase: 107 U/L — ABNORMAL HIGH (ref 26–84)
BUN, Bld: 21 mg/dL (ref 7–22)
CALCIUM: 9.5 mg/dL (ref 8.0–10.3)
CHLORIDE: 102 meq/L (ref 98–108)
CO2: 24 mEq/L (ref 18–33)
Creat: 0.9 mg/dl (ref 0.6–1.2)
Glucose, Bld: 83 mg/dL (ref 73–118)
Potassium: 4.5 mEq/L (ref 3.3–4.7)
SODIUM: 133 meq/L (ref 128–145)
TOTAL PROTEIN: 7.9 g/dL (ref 6.4–8.1)
Total Bilirubin: 0.8 mg/dl (ref 0.20–1.60)

## 2013-04-18 MED ORDER — IOHEXOL 300 MG/ML  SOLN
100.0000 mL | Freq: Once | INTRAMUSCULAR | Status: AC | PRN
  Administered 2013-04-18: 100 mL via INTRAVENOUS

## 2013-04-18 NOTE — Progress Notes (Signed)
Hematology and Oncology Follow Up Visit  SELIA WAREING [id number] 22-May-1929 80 y.o. 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  Trueheart is back for followup. She recently was hospitalized. She had metabolic issues. She developed weakness. It appeared that she had some hyponatremia. This may have been from dehydration. She also had some hyperkalemia. She had some transient renal insufficiency.  She looks better. She is in a wheelchair.. She's walking a little bit better. She still at the nursing center. Her husband originally had surgery. She's not sure when she will be able to go home.  Her weight is going up. She is eating better. There is no nausea vomiting. Her colostomy is working okay.  She's had no leg swelling. There's been no rashes. She's had no bleeding.  Currently, her performance status is ECOG 3. Medications: Current outpatient prescriptions:acetaminophen (TYLENOL) 325 MG tablet, Take 2 tablets (650 mg total) by mouth every 4 (four) hours as needed., Disp: , Rfl: ;  B Complex-C (SUPER B COMPLEX PO), Take by mouth every other day., Disp: , Rfl: ;  Cholecalciferol (VITAMIN D) 2000 UNITS tablet, Take 2,000 Units by mouth daily with breakfast., Disp: , Rfl:  citalopram (CELEXA) 10 MG tablet, Take 1 tablet (10 mg total) by mouth daily., Disp: 30 tablet, Rfl: 1;  Cyanocobalamin (VITAMIN B-12 PO), Take by mouth every other day., Disp: , Rfl: ;  diphenoxylate-atropine (LOMOTIL) 2.5-0.025 MG per tablet, Take one tablet by mouth once daily, Disp: 30 tablet, Rfl: 5;  feeding supplement, ENSURE COMPLETE, (ENSURE COMPLETE) LIQD, Take 237 mLs by mouth 3 (three) times daily between meals., Disp: 30 Bottle, Rfl: 1 LORazepam (ATIVAN) 0.5 MG tablet, Take 0.5 mg by mouth every 8  (eight) hours as needed for anxiety., Disp: , Rfl: ;  Multiple Vitamins-Minerals (ANTIOXIDANT FORMULA SG) capsule, Take 1 capsule by mouth daily., Disp: , Rfl: ;  oxyCODONE (OXY IR/ROXICODONE) 5 MG immediate release tablet, Take 5 mg by mouth every 6 (six) hours as needed for severe pain., Disp: , Rfl:  Probiotic Product (ALIGN PO), Take 1 capsule by mouth daily with breakfast., Disp: , Rfl: ;  simethicone (MYLICON) 80 MG chewable tablet, Chew 1 tablet (80 mg total) by mouth every 6 (six) hours as needed for flatulence (1 to 2 tablets)., Disp: 30 tablet, Rfl: 0  Allergies:  Allergies  Allergen Reactions  . Codeine Nausea And Vomiting  . Zofran [Ondansetron Hcl]     Past Medical History, Surgical history, Social history, and Family History were reviewed and updated.  Review of Systems: As above  Physical Exam:  height is 5\' 4"  (1.626 m) and weight is 128 lb (58.06 kg). Her oral temperature is 97.5 F (36.4 C). Her blood pressure is 141/54 and her pulse is 79. Her respiration is 14.   Elderly white female. Lungs are clear. Cardiac exam regular rate and rhythm. Abdomen is soft. Colostomy is intact. She has decent bowel sounds. Laparotomy is healed. There is no fluid wave. There is no palpable liver or spleen tip. Neck is supple. She has no palpable lymph glands. Extremities shows some muscle atrophy in upper and lower extremities. Her strength is 3/5 bilaterally. Skin exam no rashes. Neurological exam is nonfocal.  Lab Results  Component Value Date  WBC 6.7 04/18/2013   HGB 11.8 04/18/2013   HCT 35.0 04/18/2013   MCV 98 04/18/2013   PLT 214 04/18/2013     Chemistry      Component Value Date/Time   NA 133 04/18/2013 0927   NA 132* 03/08/2013 0518   K 4.5 04/18/2013 0927   K 4.5 03/08/2013 0518   CL 102 04/18/2013 0927   CL 93* 03/08/2013 0518   CO2 24 04/18/2013 0927   CO2 33* 03/08/2013 0518   BUN 21 04/18/2013 0927   BUN 24* 03/08/2013 0518   CREATININE 0.9 04/18/2013 0927   CREATININE 0.90  03/08/2013 0518      Component Value Date/Time   CALCIUM 9.5 04/18/2013 0927   CALCIUM 9.4 03/08/2013 0518   ALKPHOS 107* 04/18/2013 0927   ALKPHOS 124* 03/05/2013 1730   AST 25 04/18/2013 0927   AST 24 03/05/2013 1730   ALT 26 04/18/2013 0927   ALT 26 03/05/2013 1730   BILITOT 0.80 04/18/2013 0927   BILITOT 0.5 03/05/2013 1730         Impression and Plan: Ms. Middlebrooks is 80 year old white female. She had stage IIIB: Cancer resected back in November. She had a very difficult time with surgery. She had a satellite nodule that was positive. She had 16 lymph nodes that were negative. She was not a candidate for adjuvant chemotherapy because of her poor performance status.  Am glad that she is finally getting a little better.  We will follow her along. She still has a significant risk for recurrent disease.  We are following her CEA level.  I want to see her back in 2 more months.  I don't see need for any scans unless she has symptoms or has abnormal lab work or physical exam findings.  I spent a good half hour with her today. I went over her lab work. I went over the past x-rays that she had.   Volanda Napoleon, MD 3/25/20151:01 PM

## 2013-04-19 ENCOUNTER — Telehealth: Payer: Self-pay | Admitting: *Deleted

## 2013-04-19 LAB — LACTATE DEHYDROGENASE: LDH: 151 U/L (ref 94–250)

## 2013-04-19 LAB — CEA: CEA: 1.5 ng/mL (ref 0.0–5.0)

## 2013-04-19 NOTE — Telephone Encounter (Addendum)
Message copied by Rico Ala on Thu Apr 19, 2013  1:29 PM ------      Message from: Burney Gauze R      Created: Wed Apr 18, 2013  6:38 PM       Call and let her no that the CAT scan does not show any obvious cancer that has come back. Thanks. Pete ------  Generic message left on non personalized VM to call office for scan results. dph

## 2013-04-20 ENCOUNTER — Encounter: Payer: Self-pay | Admitting: Nurse Practitioner

## 2013-04-20 NOTE — Progress Notes (Signed)
Received a request from [facility] [place] requesting pt's medical records and labs from her office visit on 04/18/13. Information faxed to [phone] with received confirmation.

## 2013-05-15 DIAGNOSIS — Z933 Colostomy status: Secondary | ICD-10-CM | POA: Diagnosis not present

## 2013-05-18 ENCOUNTER — Telehealth (INDEPENDENT_AMBULATORY_CARE_PROVIDER_SITE_OTHER): Payer: Self-pay | Admitting: General Surgery

## 2013-05-18 NOTE — Telephone Encounter (Signed)
Patient wanted to be seen sooner than the appt made.  Informed her that as of now, this is the soonest one we have and that I have personally gone through his schedule with his assistant.  Informed her that I would make them aware that if they have anything come up sooner then we would let her know.

## 2013-06-19 ENCOUNTER — Encounter (INDEPENDENT_AMBULATORY_CARE_PROVIDER_SITE_OTHER): Payer: Self-pay | Admitting: General Surgery

## 2013-06-19 ENCOUNTER — Ambulatory Visit (INDEPENDENT_AMBULATORY_CARE_PROVIDER_SITE_OTHER): Payer: Medicare Other | Admitting: General Surgery

## 2013-06-19 VITALS — BP 124/70 | HR 83 | Temp 97.8°F | Ht 65.0 in | Wt 135.0 lb

## 2013-06-19 DIAGNOSIS — C189 Malignant neoplasm of colon, unspecified: Secondary | ICD-10-CM

## 2013-06-19 NOTE — Progress Notes (Signed)
Subjective:     Patient ID: Tamara [name], female   DOB: 07-Mar-1929, 80 y.o.   MRN: [id number]  HPI 36 yof who has stage IIIB colon cancer.  This was resected with primary anastomosis on 11/13.  This was complicated by stricture or kink and I took her back to the or while on call.  I resected right colon and gave her end ileostomy. She had a long hospital course after that. She subsequently had been in rehabilitation for a while is only really recently returned to home with her husband. She is stronger and has a good appetite. They both are happy being at home with her current quality of life. She does still have the ileostomy for which she returns today to discuss taking this down at some point. She is really having no problems with the ileostomy.  Review of Systems     Objective:   Physical Exam Well healed midline incision, ileostomy pink/functional    Assessment:     Stage IIIB colon cancer Ileostomy      Plan:     We had a long discussion today about ileostomy takedown and ileorectal anastomosis. I went over the surgery, risks, as well as the long-term outcomes after this. After this conversation she decided that she does not want to pursue this at this time. I think that is the best decision given her colon cancer, age, difficulty with the prior operation. Also that he would be difficult for her to have an ileorectal anastomosis. Therefore to call me back as needed.

## 2013-06-20 ENCOUNTER — Ambulatory Visit (HOSPITAL_BASED_OUTPATIENT_CLINIC_OR_DEPARTMENT_OTHER): Payer: Medicare Other | Admitting: Hematology & Oncology

## 2013-06-20 ENCOUNTER — Other Ambulatory Visit (HOSPITAL_BASED_OUTPATIENT_CLINIC_OR_DEPARTMENT_OTHER): Payer: Medicare Other | Admitting: Lab

## 2013-06-20 ENCOUNTER — Encounter: Payer: Self-pay | Admitting: Hematology & Oncology

## 2013-06-20 VITALS — BP 158/69 | HR 89 | Temp 97.8°F | Resp 14 | Ht 64.0 in | Wt 134.0 lb

## 2013-06-20 DIAGNOSIS — C50919 Malignant neoplasm of unspecified site of unspecified female breast: Secondary | ICD-10-CM

## 2013-06-20 DIAGNOSIS — Z853 Personal history of malignant neoplasm of breast: Secondary | ICD-10-CM

## 2013-06-20 DIAGNOSIS — C189 Malignant neoplasm of colon, unspecified: Secondary | ICD-10-CM

## 2013-06-20 DIAGNOSIS — C184 Malignant neoplasm of transverse colon: Secondary | ICD-10-CM

## 2013-06-20 LAB — CMP (CANCER CENTER ONLY)
ALK PHOS: 99 U/L — AB (ref 26–84)
ALT(SGPT): 33 U/L (ref 10–47)
AST: 36 U/L (ref 11–38)
Albumin: 3.6 g/dL (ref 3.3–5.5)
BUN, Bld: 21 mg/dL (ref 7–22)
CHLORIDE: 105 meq/L (ref 98–108)
CO2: 27 mEq/L (ref 18–33)
CREATININE: 0.8 mg/dL (ref 0.6–1.2)
Calcium: 9.6 mg/dL (ref 8.0–10.3)
Glucose, Bld: 83 mg/dL (ref 73–118)
Potassium: 4.7 mEq/L (ref 3.3–4.7)
Sodium: 142 mEq/L (ref 128–145)
Total Bilirubin: 0.7 mg/dl (ref 0.20–1.60)
Total Protein: 7.6 g/dL (ref 6.4–8.1)

## 2013-06-20 LAB — CBC WITH DIFFERENTIAL (CANCER CENTER ONLY)
BASO#: 0 10*3/uL (ref 0.0–0.2)
BASO%: 0.4 % (ref 0.0–2.0)
EOS%: 2 % (ref 0.0–7.0)
Eosinophils Absolute: 0.1 10*3/uL (ref 0.0–0.5)
HCT: 34.2 % — ABNORMAL LOW (ref 34.8–46.6)
HGB: 11.8 g/dL (ref 11.6–15.9)
LYMPH#: 2 10*3/uL (ref 0.9–3.3)
LYMPH%: 37 % (ref 14.0–48.0)
MCH: 34.7 pg — ABNORMAL HIGH (ref 26.0–34.0)
MCHC: 34.5 g/dL (ref 32.0–36.0)
MCV: 101 fL (ref 81–101)
MONO#: 0.6 10*3/uL (ref 0.1–0.9)
MONO%: 11.3 % (ref 0.0–13.0)
NEUT#: 2.7 10*3/uL (ref 1.5–6.5)
NEUT%: 49.3 % (ref 39.6–80.0)
Platelets: 143 10*3/uL — ABNORMAL LOW (ref 145–400)
RBC: 3.4 10*6/uL — ABNORMAL LOW (ref 3.70–5.32)
RDW: 12.6 % (ref 11.1–15.7)
WBC: 5.5 10*3/uL (ref 3.9–10.0)

## 2013-06-20 LAB — CEA: CEA: 1.9 ng/mL (ref 0.0–5.0)

## 2013-06-20 NOTE — Progress Notes (Signed)
Hematology and Oncology Follow Up Visit  Tamara [name] [id number] 13-Dec-1929 80 y.o. 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 is back for followup. Thus are back in March. This would done quite nicely. She I see in his home. At home for a month. Her colostomy is working well.  She's had no abnormal pain. Her appetite has been nice patient had no nausea vomiting. There's been no cough.  She is not a had a rashes.    Medications: Current outpatient prescriptions:B Complex-C (SUPER B COMPLEX PO), Take by mouth every other day., Disp: , Rfl: ;  Biotin 5 MG TABS, Take by mouth every morning., Disp: , Rfl: ;  Cholecalciferol (VITAMIN D) 2000 UNITS tablet, Take 2,000 Units by mouth daily with breakfast., Disp: , Rfl: ;  Cyanocobalamin (VITAMIN B-12 PO), Take by mouth every other day., Disp: , Rfl:  feeding supplement, ENSURE COMPLETE, (ENSURE COMPLETE) LIQD, Take 237 mLs by mouth 3 (three) times daily between meals., Disp: 30 Bottle, Rfl: 1;  Omega 3 1000 MG CAPS, Take by mouth every morning., Disp: , Rfl: ;  Probiotic Product (ALIGN PO), Take by mouth every morning., Disp: , Rfl: ;  UNABLE TO FIND, Take by mouth every morning. SUPER ANTIOXIDENT FORMULA, Disp: , Rfl:  diphenoxylate-atropine (LOMOTIL) 2.5-0.025 MG per tablet, Take one tablet by mouth once daily, Disp: 30 tablet, Rfl: 5  Allergies:  Allergies  Allergen Reactions  . Codeine Nausea And Vomiting  . Zofran [Ondansetron Hcl]     Past Medical History, Surgical history, Social history, and Family History were reviewed and updated.  Review of Systems: As above  Physical Exam:  height is 5\' 4"  (1.626 m) and weight is 134 lb (60.782 kg). Her oral temperature is 97.8 F (36.6 C). Her blood pressure is 158/69  and her pulse is 89. Her respiration is 14.   Lungs are clear. Cardiac exam regular in rhythm. Abdomen soft. She has a colostomy. There is no fluid wave. There is no palpable liver or spleen. Back exam no tenderness over the spine ribs or hips. Extremities shows no clubbing cyanosis or edema. Neurological exam is nonfocal.  Lab Results  Component Value Date   WBC 5.5 06/20/2013   HGB 11.8 06/20/2013   HCT 34.2* 06/20/2013   MCV 101 06/20/2013   PLT 143* 06/20/2013     Chemistry      Component Value Date/Time   NA 142 06/20/2013 0937   NA 132* 03/08/2013 0518   K 4.7 06/20/2013 0937   K 4.5 03/08/2013 0518   CL 105 06/20/2013 0937   CL 93* 03/08/2013 0518   CO2 27 06/20/2013 0937   CO2 33* 03/08/2013 0518   BUN 21 06/20/2013 0937   BUN 24* 03/08/2013 0518   CREATININE 0.8 06/20/2013 0937   CREATININE 0.90 03/08/2013 0518      Component Value Date/Time   CALCIUM 9.6 06/20/2013 0937   CALCIUM 9.4 03/08/2013 0518   ALKPHOS 99* 06/20/2013 0937   ALKPHOS 124* 03/05/2013 1730   AST 36 06/20/2013 0937   AST 24 03/05/2013 1730   ALT 33 06/20/2013 0937   ALT 26 03/05/2013 1730   BILITOT 0.70 06/20/2013 0937   BILITOT 0.5 03/05/2013 1730  Impression and Plan: Tamara [name] is a 80-year-old white female. She has stage IIIB colon cancer. She had incredibly difficult postoperative recovery. She had 16 lymph nodes that were negative. However, there was a satellite nodule that was positive.  She was not a candidate for adjuvant chemotherapy cause of a very poor performance status.  She is doing well right now. Her performance status is ECOG 2.  We'll go ahead and plan to go back to see [place] in about 3 months or so.  I don't see need for a PET scan was on her.  Volanda Napoleon, MD 5/27/201511:38 AM

## 2013-06-21 ENCOUNTER — Telehealth: Payer: Self-pay | Admitting: *Deleted

## 2013-06-21 NOTE — Telephone Encounter (Addendum)
Message copied by Lenn Sink on Thu Jun 21, 2013  9:12 AM ------      Message from: Burney Gauze R      Created: Wed Jun 20, 2013  5:46 PM       Call - labs are ok!!  Tamara [name] ------Informed pt that per Dr Marin Olp her labs look okay.

## 2013-07-03 ENCOUNTER — Ambulatory Visit (INDEPENDENT_AMBULATORY_CARE_PROVIDER_SITE_OTHER): Payer: Medicare Other | Admitting: General Surgery

## 2013-07-13 DIAGNOSIS — R300 Dysuria: Secondary | ICD-10-CM | POA: Diagnosis not present

## 2013-08-03 ENCOUNTER — Other Ambulatory Visit: Payer: Self-pay | Admitting: Physician Assistant

## 2013-08-03 DIAGNOSIS — C44319 Basal cell carcinoma of skin of other parts of face: Secondary | ICD-10-CM | POA: Diagnosis not present

## 2013-08-21 DIAGNOSIS — Z933 Colostomy status: Secondary | ICD-10-CM | POA: Diagnosis not present

## 2013-08-21 DIAGNOSIS — L259 Unspecified contact dermatitis, unspecified cause: Secondary | ICD-10-CM | POA: Diagnosis not present

## 2013-08-21 DIAGNOSIS — Z950 Presence of cardiac pacemaker: Secondary | ICD-10-CM | POA: Diagnosis not present

## 2013-08-21 DIAGNOSIS — C189 Malignant neoplasm of colon, unspecified: Secondary | ICD-10-CM | POA: Diagnosis not present

## 2013-08-21 DIAGNOSIS — R609 Edema, unspecified: Secondary | ICD-10-CM | POA: Diagnosis not present

## 2013-09-03 DIAGNOSIS — IMO0002 Reserved for concepts with insufficient information to code with codable children: Secondary | ICD-10-CM | POA: Diagnosis not present

## 2013-09-04 DIAGNOSIS — IMO0002 Reserved for concepts with insufficient information to code with codable children: Secondary | ICD-10-CM | POA: Diagnosis not present

## 2013-09-13 DIAGNOSIS — H25039 Anterior subcapsular polar age-related cataract, unspecified eye: Secondary | ICD-10-CM | POA: Diagnosis not present

## 2013-09-17 DIAGNOSIS — C44319 Basal cell carcinoma of skin of other parts of face: Secondary | ICD-10-CM | POA: Diagnosis not present

## 2013-09-17 DIAGNOSIS — Z85828 Personal history of other malignant neoplasm of skin: Secondary | ICD-10-CM | POA: Diagnosis not present

## 2013-09-24 DIAGNOSIS — IMO0002 Reserved for concepts with insufficient information to code with codable children: Secondary | ICD-10-CM | POA: Diagnosis not present

## 2013-09-24 DIAGNOSIS — M999 Biomechanical lesion, unspecified: Secondary | ICD-10-CM | POA: Diagnosis not present

## 2013-09-24 DIAGNOSIS — M5137 Other intervertebral disc degeneration, lumbosacral region: Secondary | ICD-10-CM | POA: Diagnosis not present

## 2013-09-25 DIAGNOSIS — IMO0002 Reserved for concepts with insufficient information to code with codable children: Secondary | ICD-10-CM | POA: Diagnosis not present

## 2013-09-25 DIAGNOSIS — M5030 Other cervical disc degeneration, unspecified cervical region: Secondary | ICD-10-CM | POA: Diagnosis not present

## 2013-09-25 DIAGNOSIS — M9981 Other biomechanical lesions of cervical region: Secondary | ICD-10-CM | POA: Diagnosis not present

## 2013-09-25 DIAGNOSIS — M999 Biomechanical lesion, unspecified: Secondary | ICD-10-CM | POA: Diagnosis not present

## 2013-09-27 DIAGNOSIS — M999 Biomechanical lesion, unspecified: Secondary | ICD-10-CM | POA: Diagnosis not present

## 2013-09-27 DIAGNOSIS — M9981 Other biomechanical lesions of cervical region: Secondary | ICD-10-CM | POA: Diagnosis not present

## 2013-09-27 DIAGNOSIS — M5030 Other cervical disc degeneration, unspecified cervical region: Secondary | ICD-10-CM | POA: Diagnosis not present

## 2013-09-27 DIAGNOSIS — IMO0002 Reserved for concepts with insufficient information to code with codable children: Secondary | ICD-10-CM | POA: Diagnosis not present

## 2013-10-02 DIAGNOSIS — IMO0002 Reserved for concepts with insufficient information to code with codable children: Secondary | ICD-10-CM | POA: Diagnosis not present

## 2013-10-02 DIAGNOSIS — M5030 Other cervical disc degeneration, unspecified cervical region: Secondary | ICD-10-CM | POA: Diagnosis not present

## 2013-10-02 DIAGNOSIS — M999 Biomechanical lesion, unspecified: Secondary | ICD-10-CM | POA: Diagnosis not present

## 2013-10-02 DIAGNOSIS — M9981 Other biomechanical lesions of cervical region: Secondary | ICD-10-CM | POA: Diagnosis not present

## 2013-10-04 ENCOUNTER — Encounter: Payer: Self-pay | Admitting: Cardiovascular Disease

## 2013-10-04 ENCOUNTER — Ambulatory Visit (INDEPENDENT_AMBULATORY_CARE_PROVIDER_SITE_OTHER): Payer: Medicare Other | Admitting: *Deleted

## 2013-10-04 ENCOUNTER — Ambulatory Visit (INDEPENDENT_AMBULATORY_CARE_PROVIDER_SITE_OTHER): Payer: Medicare Other | Admitting: Cardiovascular Disease

## 2013-10-04 VITALS — BP 128/60 | HR 75 | Resp 16 | Ht 64.0 in | Wt 133.4 lb

## 2013-10-04 DIAGNOSIS — Z950 Presence of cardiac pacemaker: Secondary | ICD-10-CM

## 2013-10-04 DIAGNOSIS — I455 Other specified heart block: Secondary | ICD-10-CM

## 2013-10-04 DIAGNOSIS — M9981 Other biomechanical lesions of cervical region: Secondary | ICD-10-CM | POA: Diagnosis not present

## 2013-10-04 DIAGNOSIS — M999 Biomechanical lesion, unspecified: Secondary | ICD-10-CM | POA: Diagnosis not present

## 2013-10-04 DIAGNOSIS — I2510 Atherosclerotic heart disease of native coronary artery without angina pectoris: Secondary | ICD-10-CM | POA: Diagnosis not present

## 2013-10-04 DIAGNOSIS — M5030 Other cervical disc degeneration, unspecified cervical region: Secondary | ICD-10-CM | POA: Diagnosis not present

## 2013-10-04 DIAGNOSIS — IMO0002 Reserved for concepts with insufficient information to code with codable children: Secondary | ICD-10-CM | POA: Diagnosis not present

## 2013-10-04 LAB — MDC_IDC_ENUM_SESS_TYPE_INCLINIC
Brady Statistic AP VP Percent: 0.2 %
Brady Statistic AS VP Percent: 0.1 %
Brady Statistic AS VS Percent: 80.5 %
Lead Channel Impedance Value: 506 Ohm
Lead Channel Impedance Value: 547 Ohm
Lead Channel Pacing Threshold Pulse Width: 0.4 ms
Lead Channel Sensing Intrinsic Amplitude: 2 mV
Lead Channel Setting Sensing Sensitivity: 5.6 mV
MDC IDC MSMT BATTERY IMPEDANCE: 100 Ohm
MDC IDC MSMT BATTERY VOLTAGE: 2.79 V
MDC IDC MSMT LEADCHNL RA PACING THRESHOLD AMPLITUDE: 0.5 V
MDC IDC MSMT LEADCHNL RV PACING THRESHOLD AMPLITUDE: 0.5 V
MDC IDC MSMT LEADCHNL RV PACING THRESHOLD PULSEWIDTH: 0.4 ms
MDC IDC MSMT LEADCHNL RV SENSING INTR AMPL: 11.2 mV
MDC IDC SET LEADCHNL RA PACING AMPLITUDE: 1.5 V
MDC IDC SET LEADCHNL RV PACING AMPLITUDE: 2 V
MDC IDC SET LEADCHNL RV PACING PULSEWIDTH: 0.4 ms
MDC IDC STAT BRADY AP VS PERCENT: 19.2 %

## 2013-10-04 NOTE — Patient Instructions (Signed)
Remote monitoring is used to monitor your Pacemaker of ICD from home. This monitoring reduces the number of office visits required to check your device to one time per year. It allows [place] to keep an eye on the functioning of your device to ensure it is working properly. You are scheduled for a device check from home on January 04, 2014. You may send your transmission at any time that day. If you have a wireless device, the transmission will be sent automatically. After your physician reviews your transmission, you will receive a postcard with your next transmission date.   Dr.Croitoru wants you to follow-up in: 1 Year. You will receive a reminder letter in the mail two months in advance. If you don't receive a letter, please call our office to schedule the follow-up appointment.

## 2013-10-05 ENCOUNTER — Encounter: Payer: Self-pay | Admitting: Cardiovascular Disease

## 2013-10-05 ENCOUNTER — Telehealth: Payer: Self-pay | Admitting: Hematology & Oncology

## 2013-10-05 NOTE — Progress Notes (Signed)
Patient ID: Tamara [name], female   DOB: [birth year], 80 y.o.   MRN: [id number]     Reason for office visit Pacemaker followup, sinus node dysfunction  Mrs. Canche is an 80 her old woman with a history of syncope related to sinus node arrest in August of 2014. She received a dual-chamber permanent pacemaker at that time and has not had any further syncopal events. She has a history of treated breast cancer but no structural heart disease. We have been receiving remote caroling downloads that show normal device function. She has no cardiac complaints but has a lot of discomfort in her left neck and shoulder. She has a tender trapezius muscle. She is seeing a chiropractor who reportedly has checked her for cervical spine problems.  Pacemaker interrogation shows normal function of her dual-chamber Medtronic Adapta device. She has 20% atrial pacing and 0.3% ventricular pacing. She has excellent heart rate histogram distribution. She has had 7 episodes of nonsustained atrial tachycardia, longest lasting 5 seconds, rates 140-160 beats per minute. These appear to be asymptomatic. There is no evidence of atrial fibrillation.   Allergies  Allergen Reactions  . Codeine Nausea And Vomiting  . Zofran [Ondansetron Hcl]     Current Outpatient Prescriptions  Medication Sig Dispense Refill  . B Complex-C (SUPER B COMPLEX PO) Take by mouth every other day.      . Biotin 5 MG TABS Take by mouth every morning.      . Cholecalciferol (VITAMIN D) 2000 UNITS tablet Take 2,000 Units by mouth daily with breakfast.      . Cyanocobalamin (VITAMIN B-12 PO) Take by mouth every other day.      . diphenoxylate-atropine (LOMOTIL) 2.5-0.025 MG per tablet Take 1 tablet by mouth as needed. Take one tablet by mouth once daily      . feeding supplement, ENSURE COMPLETE, (ENSURE COMPLETE) LIQD Take 237 mLs by mouth 3 (three) times daily between meals.  30 Bottle  1  . Omega 3 1000 MG CAPS Take by mouth every morning.      .  Probiotic Product (ALIGN PO) Take by mouth every morning.      Marland Kitchen UNABLE TO FIND Take by mouth every morning. SUPER ANTIOXIDENT FORMULA       No current facility-administered medications for this visit.    Past Medical History  Diagnosis Date  . Breast cancer   . HTN (hypertension) August 2014  . Syncope and collapse August 2014    sinus pauses/ PTVDP  . Colitis   . Colon cancer 02/21/2013    Past Surgical History  Procedure Laterality Date  . Breast lumpectomy    . Cholecystectomy    . Tonsillectomy    . Pacemaker insertion  08/28/12    MDT  . Flexible sigmoidoscopy N/A 12/06/2012    Procedure: FLEXIBLE SIGMOIDOSCOPY;  Surgeon: Lear Ng, MD;  Location: [place];  Service: Endoscopy;  Laterality: N/A;  . Laparoscopic partial colectomy N/A 12/07/2012    Procedure: LAPAROSCOPIC-ASSISTED TRANSVERSE COLECTOMY;  Surgeon: Joyice Faster. Cornett, MD;  Location: [place];  Service: General;  Laterality: N/A;  . Colonoscopy N/A 12/15/2012    Procedure: COLONOSCOPY;  Surgeon: Jeryl Columbia, MD;  Location: [facility] ENDOSCOPY;  Service: Endoscopy;  Laterality: N/A;  . Bowel decompression N/A 12/15/2012    Procedure: BOWEL DECOMPRESSION;  Surgeon: Jeryl Columbia, MD;  Location: [facility] ENDOSCOPY;  Service: Endoscopy;  Laterality: N/A;  . Colon resection N/A 12/22/2012    Procedure: EXPLORATORY LAPAROTOMY , RIGHT  Colectomy IILEOSTOMY;  Surgeon: Rolm Bookbinder, MD;  Location: [facility] OR;  Service: General;  Laterality: N/A;    Family History  Problem Relation Age of Onset  . Alzheimer's disease Mother   . Renal Disease Father   . Coronary artery disease Brother     History   Social History  . Marital Status: Married    Spouse Name: N/A    Number of Children: N/A  . Years of Education: N/A   Occupational History  . Not on file.   Social History Main Topics  . Smoking status: Never Smoker   . Smokeless tobacco: Never Used     Comment: never used tobacco  . Alcohol Use: No  . Drug Use: No    . Sexual Activity: Not on file   Other Topics Concern  . Not on file   Social History Narrative  . No narrative on file    Review of systems: The patient specifically denies any chest pain at rest or with exertion, dyspnea at rest or with exertion, orthopnea, paroxysmal nocturnal dyspnea, syncope, palpitations, focal neurological deficits, intermittent claudication, lower extremity edema, unexplained weight gain, cough, hemoptysis or wheezing.  The patient also denies abdominal pain, nausea, vomiting, dysphagia, diarrhea, constipation, polyuria, polydipsia, dysuria, hematuria, frequency, urgency, abnormal bleeding or bruising, fever, chills, unexpected weight changes, mood swings, change in skin or hair texture, change in voice quality, auditory or visual problems, allergic reactions or rashes, new musculoskeletal complaints other than usual "aches and pains".   PHYSICAL EXAM BP 128/60  Pulse 75  Resp 16  Ht 5\' 4"  (1.626 m)  Wt 133 lb 6.4 oz (60.51 kg)  BMI 22.89 kg/m2  General: Alert, oriented x3, no distress Head: no evidence of trauma, PERRL, EOMI, no exophtalmos or lid lag, no myxedema, no xanthelasma; normal ears, nose and oropharynx Neck: normal jugular venous pulsations and no hepatojugular reflux; brisk carotid pulses without delay and no carotid bruits. Tense and slightly tender left trapezius muscle Chest: clear to auscultation, no signs of consolidation by percussion or palpation, normal fremitus, symmetrical and full respiratory excursions. Healthy left subclavian pacemaker site Cardiovascular: normal position and quality of the apical impulse, regular rhythm, normal first and second heart sounds, no murmurs, rubs or gallops Abdomen: no tenderness or distention, no masses by palpation, no abnormal pulsatility or arterial bruits, normal bowel sounds, no hepatosplenomegaly Extremities: no clubbing, cyanosis or edema; 2+ radial, ulnar and brachial pulses bilaterally; 2+ right  femoral, posterior tibial and dorsalis pedis pulses; 2+ left femoral, posterior tibial and dorsalis pedis pulses; no subclavian or femoral bruits Neurological: grossly nonfocal   EKG: Atrial paced ventricular sensed, incomplete right bundle-branch block (104 ms), QTC 397 ms  Lipid Panel     Component Value Date/Time   TRIG 75 12/25/2012 0435    BMET    Component Value Date/Time   NA 142 06/20/2013 0937   NA 132* 03/08/2013 0518   K 4.7 06/20/2013 0937   K 4.5 03/08/2013 0518   CL 105 06/20/2013 0937   CL 93* 03/08/2013 0518   CO2 27 06/20/2013 0937   CO2 33* 03/08/2013 0518   GLUCOSE 83 06/20/2013 0937   GLUCOSE 95 03/08/2013 0518   BUN 21 06/20/2013 0937   BUN 24* 03/08/2013 0518   CREATININE 0.8 06/20/2013 0937   CREATININE 0.90 03/08/2013 0518   CALCIUM 9.6 06/20/2013 0937   CALCIUM 9.4 03/08/2013 0518   GFRNONAA 58* 03/08/2013 0518   GFRAA 68* 03/08/2013 0518     ASSESSMENT  AND PLAN No problem-specific assessment & plan notes found for this encounter.  No orders of the defined types were placed in this encounter.   Meds ordered this encounter  Medications  . diphenoxylate-atropine (LOMOTIL) 2.5-0.025 MG per tablet    Sig: Take 1 tablet by mouth as needed. Take one tablet by mouth once daily    [place] Ganoe  Sanda Klein, MD, [facility], [facility] [phone] office [phone] pager

## 2013-10-05 NOTE — Assessment & Plan Note (Signed)
Intermittent. Only requires pacing 20% of the time. No evidence of chronotropic incompetence.

## 2013-10-05 NOTE — Telephone Encounter (Signed)
Pt moved 9-23 to 10-9

## 2013-10-05 NOTE — Assessment & Plan Note (Signed)
Normal function of her dual-chamber permanent pacemaker. Very rare ventricular pacing. Infrequent and asymptomatic brief nonsustained ectopic atrial tachycardia. Excellent lead parameters. Continue remote monitoring every 3 months, yearly office visit the

## 2013-10-17 ENCOUNTER — Ambulatory Visit: Payer: Medicare Other | Admitting: Hematology & Oncology

## 2013-10-17 ENCOUNTER — Other Ambulatory Visit: Payer: Medicare Other | Admitting: Lab

## 2013-10-19 NOTE — Progress Notes (Signed)
Pacemaker check in clinic by [place].

## 2013-10-24 ENCOUNTER — Encounter: Payer: Self-pay | Admitting: Cardiovascular Disease

## 2013-11-02 ENCOUNTER — Encounter: Payer: Self-pay | Admitting: Hematology & Oncology

## 2013-11-02 ENCOUNTER — Ambulatory Visit (HOSPITAL_BASED_OUTPATIENT_CLINIC_OR_DEPARTMENT_OTHER): Payer: Medicare Other | Admitting: Hematology & Oncology

## 2013-11-02 ENCOUNTER — Other Ambulatory Visit (HOSPITAL_BASED_OUTPATIENT_CLINIC_OR_DEPARTMENT_OTHER): Payer: Medicare Other | Admitting: Lab

## 2013-11-02 VITALS — BP 165/65 | HR 89 | Temp 98.0°F | Resp 14 | Ht 64.0 in | Wt 134.0 lb

## 2013-11-02 DIAGNOSIS — C50919 Malignant neoplasm of unspecified site of unspecified female breast: Secondary | ICD-10-CM | POA: Diagnosis not present

## 2013-11-02 DIAGNOSIS — C189 Malignant neoplasm of colon, unspecified: Secondary | ICD-10-CM

## 2013-11-02 DIAGNOSIS — Z853 Personal history of malignant neoplasm of breast: Secondary | ICD-10-CM

## 2013-11-02 DIAGNOSIS — C184 Malignant neoplasm of transverse colon: Secondary | ICD-10-CM

## 2013-11-02 DIAGNOSIS — M62838 Other muscle spasm: Secondary | ICD-10-CM

## 2013-11-02 LAB — CBC WITH DIFFERENTIAL (CANCER CENTER ONLY)
BASO#: 0 10*3/uL (ref 0.0–0.2)
BASO%: 0.3 % (ref 0.0–2.0)
EOS%: 1.4 % (ref 0.0–7.0)
Eosinophils Absolute: 0.1 10*3/uL (ref 0.0–0.5)
HEMATOCRIT: 35.6 % (ref 34.8–46.6)
HEMOGLOBIN: 12.3 g/dL (ref 11.6–15.9)
LYMPH#: 2 10*3/uL (ref 0.9–3.3)
LYMPH%: 30.2 % (ref 14.0–48.0)
MCH: 34.5 pg — AB (ref 26.0–34.0)
MCHC: 34.6 g/dL (ref 32.0–36.0)
MCV: 100 fL (ref 81–101)
MONO#: 0.6 10*3/uL (ref 0.1–0.9)
MONO%: 9.1 % (ref 0.0–13.0)
NEUT#: 3.9 10*3/uL (ref 1.5–6.5)
NEUT%: 59 % (ref 39.6–80.0)
Platelets: 118 10*3/uL — ABNORMAL LOW (ref 145–400)
RBC: 3.57 10*6/uL — ABNORMAL LOW (ref 3.70–5.32)
RDW: 12.6 % (ref 11.1–15.7)
WBC: 6.6 10*3/uL (ref 3.9–10.0)

## 2013-11-02 LAB — CMP (CANCER CENTER ONLY)
ALT(SGPT): 24 U/L (ref 10–47)
AST: 24 U/L (ref 11–38)
Albumin: 3.7 g/dL (ref 3.3–5.5)
Alkaline Phosphatase: 88 U/L — ABNORMAL HIGH (ref 26–84)
BUN: 19 mg/dL (ref 7–22)
CALCIUM: 10 mg/dL (ref 8.0–10.3)
CHLORIDE: 106 meq/L (ref 98–108)
CO2: 25 meq/L (ref 18–33)
CREATININE: 1 mg/dL (ref 0.6–1.2)
Glucose, Bld: 92 mg/dL (ref 73–118)
Potassium: 4.4 mEq/L (ref 3.3–4.7)
Sodium: 147 mEq/L — ABNORMAL HIGH (ref 128–145)
Total Bilirubin: 0.7 mg/dl (ref 0.20–1.60)
Total Protein: 7.9 g/dL (ref 6.4–8.1)

## 2013-11-02 LAB — LACTATE DEHYDROGENASE: LDH: 151 U/L (ref 94–250)

## 2013-11-02 LAB — CEA: CEA: 7.2 ng/mL — AB (ref 0.0–5.0)

## 2013-11-02 MED ORDER — CYCLOBENZAPRINE HCL 5 MG PO TABS: 5.0000 mg | ORAL_TABLET | Freq: Three times a day (TID) | ORAL | Status: DC | PRN

## 2013-11-03 NOTE — Progress Notes (Signed)
Hematology and Oncology Follow Up Visit  Tamara [name] [id number] 28-Mar-1929 80 y.o. 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 is back for followup. She's doing pretty well. She is home. Her husband, however, in a rehabilitation. He had surgery. She's not complaining of any abdominal pain. Her colostomy is working.  Her appetite is improved. Pressure trying to walk a little more. Presently no cough. There's been no shortness of breath. She's had no leg swelling.  Her last CEA was 1.9.  Overall, her performance status is ECOG 2   Medications: Current outpatient prescriptions:B Complex-C (SUPER B COMPLEX PO), Take by mouth every other day., Disp: , Rfl: ;  Biotin 5 MG TABS, Take by mouth every morning., Disp: , Rfl: ;  Cholecalciferol (VITAMIN D) 2000 UNITS tablet, Take 2,000 Units by mouth daily with breakfast., Disp: , Rfl: ;  diphenoxylate-atropine (LOMOTIL) 2.5-0.025 MG per tablet, Take 1 tablet by mouth 2 (two) times daily as needed. Take one tablet by mouth once daily, Disp: , Rfl:  feeding supplement, ENSURE COMPLETE, (ENSURE COMPLETE) LIQD, Take 237 mLs by mouth daily., Disp: , Rfl: ;  Omega 3 1000 MG CAPS, Take by mouth every morning., Disp: , Rfl: ;  Probiotic Product (ALIGN PO), Take by mouth every morning., Disp: , Rfl: ;  UNABLE TO FIND, Take by mouth every morning. SUPER ANTIOXIDENT FORMULA, Disp: , Rfl:  cyclobenzaprine (FLEXERIL) 5 MG tablet, Take 1 tablet (5 mg total) by mouth 3 (three) times daily as needed for muscle spasms., Disp: 60 tablet, Rfl: 2  Allergies:  Allergies  Allergen Reactions  . Codeine Nausea And Vomiting  . Zofran [Ondansetron Hcl]     Past Medical History, Surgical history, Social history, and Family History were reviewed  and updated.  Review of Systems: As above  Physical Exam:  height is 5\' 4"  (1.626 m) and weight is 134 lb (60.782 kg). Her oral temperature is 98 F (36.7 C). Her blood pressure is 165/65 and her pulse is 89. Her respiration is 14.   Lungs are clear. Cardiac exam regular in rhythm. Abdomen soft. She has a colostomy. There is no fluid wave. There is no palpable liver or spleen. Back exam no tenderness over the spine ribs or hips. Extremities shows no clubbing cyanosis or edema. Neurological exam is nonfocal.  Lab Results  Component Value Date   WBC 6.6 11/02/2013   HGB 12.3 11/02/2013   HCT 35.6 11/02/2013   MCV 100 11/02/2013   PLT 118* 11/02/2013     Chemistry      Component Value Date/Time   NA 147* 11/02/2013 1109   NA 132* 03/08/2013 0518   K 4.4 11/02/2013 1109   K 4.5 03/08/2013 0518   CL 106 11/02/2013 1109   CL 93* 03/08/2013 0518   CO2 25 11/02/2013 1109   CO2 33* 03/08/2013 0518   BUN 19 11/02/2013 1109   BUN 24* 03/08/2013 0518   CREATININE 1.0 11/02/2013 1109   CREATININE 0.90 03/08/2013 0518      Component Value Date/Time   CALCIUM 10.0 11/02/2013 1109   CALCIUM 9.4 03/08/2013 0518   ALKPHOS 88* 11/02/2013 1109   ALKPHOS 124* 03/05/2013 1730   AST 24 11/02/2013 1109   AST 24 03/05/2013  1730   ALT 24 11/02/2013 1109   ALT 26 03/05/2013 1730   BILITOT 0.70 11/02/2013 1109   BILITOT 0.5 03/05/2013 1730      CEA is 7.2   Impression and Plan: Tamara [name] is a 80 year old female. She had a stage IIIB adenocarcinoma of the transverse colon. She had 16 negative lymph nodes. However, there was noted to be a positive satellite nodule.  She was not a good candidate for systemic chemotherapy in the adjuvant setting when we first saw her. I thought that we can just follow her along.  That her CEA is elevated is quite troublesome.  We may have to get her set up for some scans to see if she does have recurrence.  If we do find recurrence, then we will have to see about the possibility of  a chemotherapy.  She still has a marginal performance status. As such, I'm still not sure what type of candidate she would be for systemic therapy.  I will plan to see her back in a few more months. Again, I will have to call her about the CEA been getting a CT scan.  Volanda Napoleon, MD 10/10/20158:08 AM

## 2013-12-13 DIAGNOSIS — Z85828 Personal history of other malignant neoplasm of skin: Secondary | ICD-10-CM | POA: Diagnosis not present

## 2013-12-13 DIAGNOSIS — Z08 Encounter for follow-up examination after completed treatment for malignant neoplasm: Secondary | ICD-10-CM | POA: Diagnosis not present

## 2014-01-03 ENCOUNTER — Encounter (HOSPITAL_COMMUNITY): Payer: Self-pay | Admitting: Cardiovascular Disease

## 2014-01-04 ENCOUNTER — Telehealth: Payer: Self-pay | Admitting: *Deleted

## 2014-01-04 ENCOUNTER — Other Ambulatory Visit: Payer: Self-pay | Admitting: Internal Medicine

## 2014-01-04 ENCOUNTER — Encounter: Payer: Medicare Other | Admitting: *Deleted

## 2014-01-04 ENCOUNTER — Ambulatory Visit
Admission: RE | Admit: 2014-01-04 | Discharge: 2014-01-04 | Disposition: A | Discharge status: Home / Self Care | Payer: Medicare Other | Source: Ambulatory Visit | Attending: Internal Medicine | Admitting: Internal Medicine

## 2014-01-04 ENCOUNTER — Telehealth: Payer: Self-pay | Admitting: Cardiology

## 2014-01-04 DIAGNOSIS — M47812 Spondylosis without myelopathy or radiculopathy, cervical region: Secondary | ICD-10-CM | POA: Diagnosis not present

## 2014-01-04 DIAGNOSIS — M62838 Other muscle spasm: Secondary | ICD-10-CM | POA: Diagnosis not present

## 2014-01-04 DIAGNOSIS — M542 Cervicalgia: Secondary | ICD-10-CM

## 2014-01-04 DIAGNOSIS — M25512 Pain in left shoulder: Secondary | ICD-10-CM | POA: Diagnosis not present

## 2014-01-04 DIAGNOSIS — M4312 Spondylolisthesis, cervical region: Secondary | ICD-10-CM | POA: Diagnosis not present

## 2014-01-04 DIAGNOSIS — M5032 Other cervical disc degeneration, mid-cervical region: Secondary | ICD-10-CM | POA: Diagnosis not present

## 2014-01-04 NOTE — Telephone Encounter (Signed)
New Message        Pt calling stating that she is returning phone call from [place]. Pt states she can't get device to send remote signal and she doesn't want to try it again. Pt wants to come in on Monday and have someone look at it. Please call back and advise.

## 2014-01-04 NOTE — Telephone Encounter (Signed)
LMOVM reminding pt to send remote transmission.   

## 2014-01-07 ENCOUNTER — Telehealth: Payer: Self-pay | Admitting: Cardiovascular Disease

## 2014-01-07 NOTE — Telephone Encounter (Signed)
Spoke w/ pt and instructed her to reposition phone cord into phone port that says line and to re try transmission. Instructed pt to call back and to verify if transmission was received. Pt verbalized understanding.

## 2014-01-09 ENCOUNTER — Ambulatory Visit (INDEPENDENT_AMBULATORY_CARE_PROVIDER_SITE_OTHER): Payer: Medicare Other | Admitting: *Deleted

## 2014-01-09 DIAGNOSIS — I455 Other specified heart block: Secondary | ICD-10-CM

## 2014-01-09 LAB — MDC_IDC_ENUM_SESS_TYPE_INCLINIC
Battery Impedance: 100 Ohm
Battery Remaining Longevity: 170 mo
Battery Voltage: 2.79 V
Date Time Interrogation Session: 20151216111126
Lead Channel Impedance Value: 547 Ohm
Lead Channel Pacing Threshold Amplitude: 0.75 V
Lead Channel Pacing Threshold Pulse Width: 0.4 ms
Lead Channel Setting Pacing Amplitude: 1.5 V
Lead Channel Setting Pacing Amplitude: 2 V
Lead Channel Setting Pacing Pulse Width: 0.4 ms
Lead Channel Setting Sensing Sensitivity: 4 mV
MDC IDC MSMT LEADCHNL RA PACING THRESHOLD AMPLITUDE: 0.5 V
MDC IDC MSMT LEADCHNL RA PACING THRESHOLD PULSEWIDTH: 0.4 ms
MDC IDC MSMT LEADCHNL RA SENSING INTR AMPL: 2.8 mV
MDC IDC MSMT LEADCHNL RV IMPEDANCE VALUE: 536 Ohm
MDC IDC MSMT LEADCHNL RV SENSING INTR AMPL: 11.2 mV
MDC IDC STAT BRADY AP VP PERCENT: 0 %
MDC IDC STAT BRADY AP VS PERCENT: 44 %
MDC IDC STAT BRADY AS VP PERCENT: 0 %
MDC IDC STAT BRADY AS VS PERCENT: 55 %

## 2014-01-09 NOTE — Progress Notes (Signed)
Pacemaker check in clinic. Normal device function. Thresholds, sensing, impedances consistent with previous measurements. Device programmed to maximize longevity. 33 mode switches (<0.1%)--all <30 sec, no EGMs. 2 high ventricular rates noted---max dur. 4 sec, Max V 171---AT. Device programmed at appropriate safety margins. Histogram distribution appropriate for patient activity level. Device programmed to optimize intrinsic conduction. Estimated longevity 14 years. Patient will follow up with the [facility] in 6 months.

## 2014-01-30 ENCOUNTER — Encounter: Payer: Self-pay | Admitting: Cardiovascular Disease

## 2014-02-13 DIAGNOSIS — J069 Acute upper respiratory infection, unspecified: Secondary | ICD-10-CM | POA: Diagnosis not present

## 2014-02-22 DIAGNOSIS — C50919 Malignant neoplasm of unspecified site of unspecified female breast: Secondary | ICD-10-CM | POA: Diagnosis not present

## 2014-02-22 DIAGNOSIS — Z0001 Encounter for general adult medical examination with abnormal findings: Secondary | ICD-10-CM | POA: Diagnosis not present

## 2014-02-22 DIAGNOSIS — D500 Iron deficiency anemia secondary to blood loss (chronic): Secondary | ICD-10-CM | POA: Diagnosis not present

## 2014-02-22 DIAGNOSIS — I10 Essential (primary) hypertension: Secondary | ICD-10-CM | POA: Diagnosis not present

## 2014-02-22 DIAGNOSIS — M542 Cervicalgia: Secondary | ICD-10-CM | POA: Diagnosis not present

## 2014-02-22 DIAGNOSIS — C189 Malignant neoplasm of colon, unspecified: Secondary | ICD-10-CM | POA: Diagnosis not present

## 2014-02-22 DIAGNOSIS — Z950 Presence of cardiac pacemaker: Secondary | ICD-10-CM | POA: Diagnosis not present

## 2014-03-06 ENCOUNTER — Other Ambulatory Visit (HOSPITAL_BASED_OUTPATIENT_CLINIC_OR_DEPARTMENT_OTHER): Payer: Medicare Other | Admitting: Lab

## 2014-03-06 ENCOUNTER — Encounter: Payer: Self-pay | Admitting: Hematology & Oncology

## 2014-03-06 ENCOUNTER — Ambulatory Visit (HOSPITAL_BASED_OUTPATIENT_CLINIC_OR_DEPARTMENT_OTHER): Payer: Medicare Other | Admitting: Hematology & Oncology

## 2014-03-06 VITALS — BP 140/62 | HR 82 | Temp 97.2°F | Resp 14 | Ht 64.0 in | Wt 128.0 lb

## 2014-03-06 DIAGNOSIS — C189 Malignant neoplasm of colon, unspecified: Secondary | ICD-10-CM | POA: Diagnosis not present

## 2014-03-06 DIAGNOSIS — Z933 Colostomy status: Secondary | ICD-10-CM

## 2014-03-06 DIAGNOSIS — Z853 Personal history of malignant neoplasm of breast: Secondary | ICD-10-CM

## 2014-03-06 DIAGNOSIS — Z85038 Personal history of other malignant neoplasm of large intestine: Secondary | ICD-10-CM

## 2014-03-06 DIAGNOSIS — M62838 Other muscle spasm: Secondary | ICD-10-CM

## 2014-03-06 DIAGNOSIS — M6248 Contracture of muscle, other site: Secondary | ICD-10-CM | POA: Diagnosis not present

## 2014-03-06 LAB — CBC WITH DIFFERENTIAL (CANCER CENTER ONLY)
BASO#: 0 10*3/uL (ref 0.0–0.2)
BASO%: 0.7 % (ref 0.0–2.0)
EOS ABS: 0.1 10*3/uL (ref 0.0–0.5)
EOS%: 1.4 % (ref 0.0–7.0)
HCT: 34.5 % — ABNORMAL LOW (ref 34.8–46.6)
HEMOGLOBIN: 11.8 g/dL (ref 11.6–15.9)
LYMPH#: 1.7 10*3/uL (ref 0.9–3.3)
LYMPH%: 28.8 % (ref 14.0–48.0)
MCH: 34.7 pg — ABNORMAL HIGH (ref 26.0–34.0)
MCHC: 34.2 g/dL (ref 32.0–36.0)
MCV: 102 fL — AB (ref 81–101)
MONO#: 0.6 10*3/uL (ref 0.1–0.9)
MONO%: 10.9 % (ref 0.0–13.0)
NEUT#: 3.4 10*3/uL (ref 1.5–6.5)
NEUT%: 58.2 % (ref 39.6–80.0)
Platelets: 131 10*3/uL — ABNORMAL LOW (ref 145–400)
RBC: 3.4 10*6/uL — AB (ref 3.70–5.32)
RDW: 13.1 % (ref 11.1–15.7)
WBC: 5.8 10*3/uL (ref 3.9–10.0)

## 2014-03-06 LAB — CHCC SATELLITE - SMEAR

## 2014-03-06 NOTE — Progress Notes (Signed)
Hematology and Oncology Follow Up Visit  Tamara [name] [id number] 26-Feb-1929 81 y.o. 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 is back for followup. Continues to look quite well. She is eating well. She's lost some weight. She says she just does not eat as much anymore. She has about 5 meals a day. She still has the colostomy. This is functioning well.  His been no abdominal pain. She's had no cough. There's been no chest wall pain.  Her main problem has been her neck. She has stiffness mostly in the left side of the neck. She has been to a Restaurant manager, fast food. She cannot have MRI because of a pacemaker in place.  Her last CEA was up a little bit 7.2. We will have to see what this one is. If it is up further, that they will have to do scans on her.  Overall, her performance status is ECOG 1.  Medications:  Current outpatient prescriptions:  .  B Complex-C (SUPER B COMPLEX PO), Take by mouth every other day., Disp: , Rfl:  .  Biotin 5 MG TABS, Take by mouth every morning., Disp: , Rfl:  .  Cholecalciferol (VITAMIN D) 2000 UNITS tablet, Take 2,000 Units by mouth daily with breakfast., Disp: , Rfl:  .  diphenoxylate-atropine (LOMOTIL) 2.5-0.025 MG per tablet, Take 1 tablet by mouth 2 (two) times daily as needed. Take one tablet by mouth once daily, Disp: , Rfl:  .  feeding supplement, ENSURE COMPLETE, (ENSURE COMPLETE) LIQD, Take 237 mLs by mouth daily., Disp: , Rfl:  .  Omega 3 1000 MG CAPS, Take by mouth every morning., Disp: , Rfl:  .  Probiotic Product (ALIGN PO), Take by mouth every morning., Disp: , Rfl:  .  UNABLE TO FIND, Take by mouth every morning. SUPER ANTIOXIDENT FORMULA, Disp: , Rfl:  .  cyclobenzaprine (FLEXERIL) 5 MG tablet, Take 1 tablet (5 mg total) by  mouth 3 (three) times daily as needed for muscle spasms. (Patient not taking: Reported on 03/06/2014), Disp: 60 tablet, Rfl: 2  Allergies:  Allergies  Allergen Reactions  . Codeine Nausea And Vomiting  . Zofran [Ondansetron Hcl]     Past Medical History, Surgical history, Social history, and Family History were reviewed and updated.  Review of Systems: As above  Physical Exam:  height is 5\' 4"  (1.626 m) and weight is 128 lb (58.06 kg). Her oral temperature is 97.2 F (36.2 C). Her blood pressure is 140/62 and her pulse is 82. Her respiration is 14.   Head and neck exam shows no ocular or oral lesions. She has some slight tenderness to palpation in the upper part of the left neck. No lymph nodes are noted. There may be some slight muscle spasm over on the left side of her neck. She has no supraclavicular lymph nodes. There is no oral lesions. Lungs are clear with no rales, wheezes or rhonchi.. Cardiac exam regular rate and rhythm with no murmurs, rubs or bruits.. Abdomen is soft. She has a colostomy. There is no fluid wave. There is no palpable liver or spleen. Back exam no tenderness over the spine ribs or hips. Extremities shows no clubbing cyanosis or edema. Neurological exam is nonfocal. Skin exam shows no rashes, ecchymoses  or petechia.  Lab Results  Component Value Date   WBC 5.8 03/06/2014   HGB 11.8 03/06/2014   HCT 34.5* 03/06/2014   MCV 102* 03/06/2014   PLT 131* 03/06/2014     Chemistry      Component Value Date/Time   NA 147* 11/02/2013 1109   NA 132* 03/08/2013 0518   K 4.4 11/02/2013 1109   K 4.5 03/08/2013 0518   CL 106 11/02/2013 1109   CL 93* 03/08/2013 0518   CO2 25 11/02/2013 1109   CO2 33* 03/08/2013 0518   BUN 19 11/02/2013 1109   BUN 24* 03/08/2013 0518   CREATININE 1.0 11/02/2013 1109   CREATININE 0.90 03/08/2013 0518      Component Value Date/Time   CALCIUM 10.0 11/02/2013 1109   CALCIUM 9.4 03/08/2013 0518   ALKPHOS 88* 11/02/2013 1109   ALKPHOS  124* 03/05/2013 1730   AST 24 11/02/2013 1109   AST 24 03/05/2013 1730   ALT 24 11/02/2013 1109   ALT 26 03/05/2013 1730   BILITOT 0.70 11/02/2013 1109   BILITOT 0.5 03/05/2013 1730        Impression and Plan: Tamara [name] is a 81 year old female. She had a stage IIIB adenocarcinoma of the transverse colon. She had 16 negative lymph nodes. However, there was noted to be a positive satellite nodule.  Again, the CEA will be key. If we do see further elevation, then we will have to do scans on her. She does have a risk of recurrence that is significant.  Otherwise, I think we'll probably get her back in about 3 months.  She had her surgery back in November 2014. She has made a lot of progress since then.   Volanda Napoleon, MD 2/10/201611:07 AM

## 2014-03-07 ENCOUNTER — Telehealth: Payer: Self-pay | Admitting: Hematology & Oncology

## 2014-03-07 LAB — COMPREHENSIVE METABOLIC PANEL
ALBUMIN: 4.1 g/dL (ref 3.5–5.2)
ALT: 18 U/L (ref 0–35)
AST: 24 U/L (ref 0–37)
Alkaline Phosphatase: 88 U/L (ref 39–117)
BILIRUBIN TOTAL: 0.6 mg/dL (ref 0.2–1.2)
BUN: 23 mg/dL (ref 6–23)
CO2: 24 mEq/L (ref 19–32)
Calcium: 9.7 mg/dL (ref 8.4–10.5)
Chloride: 105 mEq/L (ref 96–112)
Creatinine, Ser: 0.96 mg/dL (ref 0.50–1.10)
Glucose, Bld: 74 mg/dL (ref 70–99)
Potassium: 4.8 mEq/L (ref 3.5–5.3)
Sodium: 136 mEq/L (ref 135–145)
TOTAL PROTEIN: 7.1 g/dL (ref 6.0–8.3)

## 2014-03-07 LAB — CEA: CEA: 24.4 ng/mL — ABNORMAL HIGH (ref 0.0–5.0)

## 2014-03-07 NOTE — Telephone Encounter (Signed)
Pt aware of 2-16 CT to drink at 8 and 9 am and be NPO 4 hrs prior

## 2014-03-07 NOTE — Addendum Note (Signed)
Addended by: Burney Gauze R on: 03/07/2014 10:33 AM   Modules accepted: Orders

## 2014-03-11 ENCOUNTER — Telehealth: Payer: Self-pay | Admitting: Hematology & Oncology

## 2014-03-11 NOTE — Telephone Encounter (Signed)
Pt has questions about 2-16 CT and drinking contrast with ostomy bag, I transferred call to Judson Roch in radiology.

## 2014-03-12 ENCOUNTER — Ambulatory Visit (HOSPITAL_BASED_OUTPATIENT_CLINIC_OR_DEPARTMENT_OTHER)
Admission: RE | Admit: 2014-03-12 | Discharge: 2014-03-12 | Disposition: A | Discharge status: Home / Self Care | Payer: Medicare Other | Source: Ambulatory Visit | Attending: Hematology & Oncology | Admitting: Hematology & Oncology

## 2014-03-12 ENCOUNTER — Encounter (HOSPITAL_BASED_OUTPATIENT_CLINIC_OR_DEPARTMENT_OTHER): Payer: Self-pay

## 2014-03-12 DIAGNOSIS — D259 Leiomyoma of uterus, unspecified: Secondary | ICD-10-CM | POA: Insufficient documentation

## 2014-03-12 DIAGNOSIS — Z853 Personal history of malignant neoplasm of breast: Secondary | ICD-10-CM | POA: Insufficient documentation

## 2014-03-12 DIAGNOSIS — C189 Malignant neoplasm of colon, unspecified: Secondary | ICD-10-CM | POA: Diagnosis not present

## 2014-03-12 DIAGNOSIS — Z932 Ileostomy status: Secondary | ICD-10-CM | POA: Insufficient documentation

## 2014-03-12 DIAGNOSIS — R970 Elevated carcinoembryonic antigen [CEA]: Secondary | ICD-10-CM | POA: Diagnosis not present

## 2014-03-12 DIAGNOSIS — J984 Other disorders of lung: Secondary | ICD-10-CM | POA: Diagnosis not present

## 2014-03-12 DIAGNOSIS — Z9049 Acquired absence of other specified parts of digestive tract: Secondary | ICD-10-CM | POA: Diagnosis not present

## 2014-03-12 MED ORDER — IOHEXOL 300 MG/ML  SOLN
100.0000 mL | Freq: Once | INTRAMUSCULAR | Status: AC | PRN
  Administered 2014-03-12: 100 mL via INTRAVENOUS

## 2014-03-13 ENCOUNTER — Other Ambulatory Visit: Payer: Self-pay | Admitting: Hematology & Oncology

## 2014-03-13 DIAGNOSIS — C189 Malignant neoplasm of colon, unspecified: Secondary | ICD-10-CM

## 2014-04-15 ENCOUNTER — Encounter (HOSPITAL_COMMUNITY): Payer: Self-pay

## 2014-04-15 ENCOUNTER — Observation Stay (HOSPITAL_COMMUNITY)
Admission: EM | Admit: 2014-04-15 | Discharge: 2014-04-18 | Disposition: A | Discharge status: Home / Self Care | Payer: Medicare Other | Attending: Internal Medicine | Admitting: Internal Medicine

## 2014-04-15 DIAGNOSIS — R112 Nausea with vomiting, unspecified: Secondary | ICD-10-CM | POA: Diagnosis present

## 2014-04-15 DIAGNOSIS — R110 Nausea: Secondary | ICD-10-CM | POA: Diagnosis not present

## 2014-04-15 DIAGNOSIS — Z853 Personal history of malignant neoplasm of breast: Secondary | ICD-10-CM | POA: Insufficient documentation

## 2014-04-15 DIAGNOSIS — E860 Dehydration: Principal | ICD-10-CM | POA: Diagnosis present

## 2014-04-15 DIAGNOSIS — A084 Viral intestinal infection, unspecified: Secondary | ICD-10-CM | POA: Diagnosis not present

## 2014-04-15 DIAGNOSIS — Z7901 Long term (current) use of anticoagulants: Secondary | ICD-10-CM | POA: Diagnosis not present

## 2014-04-15 DIAGNOSIS — Z933 Colostomy status: Secondary | ICD-10-CM | POA: Diagnosis not present

## 2014-04-15 DIAGNOSIS — Z950 Presence of cardiac pacemaker: Secondary | ICD-10-CM | POA: Diagnosis not present

## 2014-04-15 DIAGNOSIS — Z85038 Personal history of other malignant neoplasm of large intestine: Secondary | ICD-10-CM | POA: Diagnosis not present

## 2014-04-15 DIAGNOSIS — R531 Weakness: Secondary | ICD-10-CM | POA: Diagnosis not present

## 2014-04-15 DIAGNOSIS — I10 Essential (primary) hypertension: Secondary | ICD-10-CM | POA: Diagnosis not present

## 2014-04-15 DIAGNOSIS — N179 Acute kidney failure, unspecified: Secondary | ICD-10-CM | POA: Diagnosis not present

## 2014-04-15 LAB — COMPREHENSIVE METABOLIC PANEL
ALBUMIN: 4.4 g/dL (ref 3.5–5.2)
ALT: 34 U/L (ref 0–35)
ANION GAP: 10 (ref 5–15)
AST: 35 U/L (ref 0–37)
Alkaline Phosphatase: 105 U/L (ref 39–117)
BILIRUBIN TOTAL: 0.7 mg/dL (ref 0.3–1.2)
BUN: 29 mg/dL — AB (ref 6–23)
CALCIUM: 9.8 mg/dL (ref 8.4–10.5)
CHLORIDE: 107 mmol/L (ref 96–112)
CO2: 20 mmol/L (ref 19–32)
CREATININE: 1.04 mg/dL (ref 0.50–1.10)
GFR calc Af Amer: 56 mL/min — ABNORMAL LOW (ref >90)
GFR calc non Af Amer: 49 mL/min — ABNORMAL LOW (ref >90)
Glucose, Bld: 132 mg/dL — ABNORMAL HIGH (ref 70–99)
Potassium: 4.6 mmol/L (ref 3.5–5.1)
Sodium: 137 mmol/L (ref 135–145)
TOTAL PROTEIN: 8 g/dL (ref 6.0–8.3)

## 2014-04-15 LAB — CBC WITH DIFFERENTIAL/PLATELET
BASOS ABS: 0 10*3/uL (ref 0.0–0.1)
BASOS PCT: 0 % (ref 0–1)
EOS ABS: 0 10*3/uL (ref 0.0–0.7)
Eosinophils Relative: 0 % (ref 0–5)
HCT: 39.1 % (ref 36.0–46.0)
HEMOGLOBIN: 13.1 g/dL (ref 12.0–15.0)
Lymphocytes Relative: 5 % — ABNORMAL LOW (ref 12–46)
Lymphs Abs: 0.4 10*3/uL — ABNORMAL LOW (ref 0.7–4.0)
MCH: 34.2 pg — AB (ref 26.0–34.0)
MCHC: 33.5 g/dL (ref 30.0–36.0)
MCV: 102.1 fL — ABNORMAL HIGH (ref 78.0–100.0)
MONOS PCT: 3 % (ref 3–12)
Monocytes Absolute: 0.2 10*3/uL (ref 0.1–1.0)
NEUTROS PCT: 92 % — AB (ref 43–77)
Neutro Abs: 6.8 10*3/uL (ref 1.7–7.7)
Platelets: 120 10*3/uL — ABNORMAL LOW (ref 150–400)
RBC: 3.83 MIL/uL — ABNORMAL LOW (ref 3.87–5.11)
RDW: 13.2 % (ref 11.5–15.5)
WBC: 7.5 10*3/uL (ref 4.0–10.5)

## 2014-04-15 LAB — POC OCCULT BLOOD, ED: FECAL OCCULT BLD: NEGATIVE

## 2014-04-15 LAB — LIPASE, BLOOD: Lipase: 25 U/L (ref 11–59)

## 2014-04-15 LAB — CLOSTRIDIUM DIFFICILE BY PCR: Toxigenic C. Difficile by PCR: NEGATIVE

## 2014-04-15 LAB — I-STAT CG4 LACTIC ACID, ED
Lactic Acid, Venous: 1.8 mmol/L (ref 0.5–2.0)
Lactic Acid, Venous: 1.33 mmol/L (ref 0.5–2.0)

## 2014-04-15 MED ORDER — SODIUM CHLORIDE 0.9 % IJ SOLN
3.0000 mL | Freq: Two times a day (BID) | INTRAMUSCULAR | Status: DC
  Administered 2014-04-15: 3 mL via INTRAVENOUS

## 2014-04-15 MED ORDER — PROMETHAZINE HCL 25 MG PO TABS
12.5000 mg | ORAL_TABLET | Freq: Four times a day (QID) | ORAL | Status: DC | PRN
  Administered 2014-04-17: 12.5 mg via ORAL
  Filled 2014-04-15: qty 1

## 2014-04-15 MED ORDER — HEPARIN SODIUM (PORCINE) 5000 UNIT/ML IJ SOLN
5000.0000 [IU] | Freq: Three times a day (TID) | INTRAMUSCULAR | Status: DC
  Administered 2014-04-15 – 2014-04-18 (×8): 5000 [IU] via SUBCUTANEOUS
  Filled 2014-04-15 (×6): qty 1

## 2014-04-15 MED ORDER — PROMETHAZINE HCL 25 MG/ML IJ SOLN
6.2500 mg | Freq: Once | INTRAMUSCULAR | Status: AC
  Administered 2014-04-15: 6.25 mg via INTRAVENOUS
  Filled 2014-04-15: qty 1

## 2014-04-15 MED ORDER — PROMETHAZINE HCL 25 MG/ML IJ SOLN
6.2500 mg | Freq: Once | INTRAMUSCULAR | Status: DC
  Filled 2014-04-15: qty 1

## 2014-04-15 MED ORDER — SODIUM CHLORIDE 0.9 % IV BOLUS (SEPSIS)
1000.0000 mL | Freq: Once | INTRAVENOUS | Status: AC
  Administered 2014-04-15: 1000 mL via INTRAVENOUS

## 2014-04-15 MED ORDER — ZOLPIDEM TARTRATE 5 MG PO TABS
5.0000 mg | ORAL_TABLET | Freq: Once | ORAL | Status: AC
  Administered 2014-04-15: 5 mg via ORAL
  Filled 2014-04-15: qty 1

## 2014-04-15 MED ORDER — ENSURE COMPLETE PO LIQD
237.0000 mL | Freq: Two times a day (BID) | ORAL | Status: DC
  Administered 2014-04-16 – 2014-04-18 (×5): 237 mL via ORAL

## 2014-04-15 MED ORDER — SODIUM CHLORIDE 0.9 % IV SOLN
INTRAVENOUS | Status: DC
  Administered 2014-04-15 – 2014-04-16 (×3): via INTRAVENOUS

## 2014-04-15 MED ORDER — CYCLOBENZAPRINE HCL 5 MG PO TABS
5.0000 mg | ORAL_TABLET | Freq: Three times a day (TID) | ORAL | Status: DC | PRN
  Administered 2014-04-16: 5 mg via ORAL
  Filled 2014-04-15: qty 1

## 2014-04-15 MED ORDER — DIPHENOXYLATE-ATROPINE 2.5-0.025 MG PO TABS
1.0000 | ORAL_TABLET | Freq: Two times a day (BID) | ORAL | Status: DC
  Administered 2014-04-15 – 2014-04-18 (×6): 1 via ORAL
  Filled 2014-04-15 (×6): qty 1

## 2014-04-15 MED ORDER — ACETAMINOPHEN 500 MG PO TABS
500.0000 mg | ORAL_TABLET | Freq: Four times a day (QID) | ORAL | Status: DC | PRN
  Administered 2014-04-16 (×2): 500 mg via ORAL
  Filled 2014-04-15 (×2): qty 1

## 2014-04-15 NOTE — ED Notes (Signed)
Bed: WA20 Expected date:  Expected time:  Means of arrival:  Comments: Ems- for room 20

## 2014-04-15 NOTE — ED Notes (Addendum)
MD at bedside. EDP ALLEN PRESENT TO EVALUATE THIS PT

## 2014-04-15 NOTE — ED Notes (Signed)
RACHEL NT TRANSFERRED THIS PT BELONGING BAG X 1

## 2014-04-15 NOTE — ED Notes (Signed)
Per PTAR. Pt lives at home cared for by Husband. Pt survived colon CA resulting in colostomy. Pt c/o of diarrhea x 36 hours. Pt c/o of N/D without vomiting and weakness. Lymph node swelling to neck. Denies falls. Pt reports dental work several. days ago. GCS 15 NEG STROKE FULL CODE.

## 2014-04-15 NOTE — ED Notes (Addendum)
[place]

## 2014-04-15 NOTE — ED Provider Notes (Signed)
CSN: [id number]     Arrival date & time 04/15/14  1229 History   None    Chief Complaint  Patient presents with  . Diarrhea  . Weakness  . colostomy     HPI    81 year-old female with a history of stage III B adenocarcinoma status post partial colectomy with formation of ostomy in 2014. She presents today with 2 days of fatigue and an increase in her ostomy output. Patient reports yesterday she was fatigue but noticed no increase in output, this morning that he continued she reports that she got up to go into her colostomy bag felt lightheaded and had to sit down. She denies loss of consciousness, chest pain, shortness of breath, abdominal pain, nausea or any other signs or symptoms. Patient wishes able to get back to her bed with the assistance of her husband. She notes that normally her ostomy produces 3-5 bags per day but today significantly more, with a darker appearance. Patient additionally notes that she had Placed on her right lower wisdom tooth on Thursday that has been painful. Patient denies fever reports she usually doesn't run a fever when she gets sick. Patient denies any changes in her urinary output, consistency, clarity, or painful urinations. She denies any rashes. Patient denies chest pain, shortness of breath, lower extremity edema. She notes that she does have some right lower quadrant tenderness but this is not increased over her baseline. Patient's main concern today is her fatigue and increase in bilious output in her ileostomy ba   Past Medical History  Diagnosis Date  . HTN (hypertension) August 2014  . Syncope and collapse August 2014    sinus pauses/ PTVDP  . Colitis   . Breast cancer   . Colon cancer 02/21/2013   Past Surgical History  Procedure Laterality Date  . Breast lumpectomy    . Cholecystectomy    . Tonsillectomy    . Pacemaker insertion  08/28/12    MDT  . Flexible sigmoidoscopy N/A 12/06/2012    Procedure: FLEXIBLE SIGMOIDOSCOPY;  Surgeon: Lear Ng, MD;  Location: [place];  Service: Endoscopy;  Laterality: N/A;  . Laparoscopic partial colectomy N/A 12/07/2012    Procedure: LAPAROSCOPIC-ASSISTED TRANSVERSE COLECTOMY;  Surgeon: Joyice Faster. Cornett, MD;  Location: [place];  Service: General;  Laterality: N/A;  . Colonoscopy N/A 12/15/2012    Procedure: COLONOSCOPY;  Surgeon: Jeryl Columbia, MD;  Location: [facility] ENDOSCOPY;  Service: Endoscopy;  Laterality: N/A;  . Bowel decompression N/A 12/15/2012    Procedure: BOWEL DECOMPRESSION;  Surgeon: Jeryl Columbia, MD;  Location: [facility] ENDOSCOPY;  Service: Endoscopy;  Laterality: N/A;  . Colon resection N/A 12/22/2012    Procedure: EXPLORATORY LAPAROTOMY , RIGHT Colectomy IILEOSTOMY;  Surgeon: Rolm Bookbinder, MD;  Location: [place];  Service: General;  Laterality: N/A;  . Left heart catheterization with coronary angiogram Bilateral 08/26/2012    Procedure: LEFT HEART CATHETERIZATION WITH CORONARY ANGIOGRAM;  Surgeon: Lorretta Harp, MD;  Location[facility] CATH LAB;  Service: Cardiovascular;  Laterality: Bilateral;  . Permanent pacemaker insertion N/A 08/28/2012    Procedure: PERMANENT PACEMAKER INSERTION;  Surgeon: Sanda Klein, MD;  Location: [place] CATH LAB;  Service: Cardiovascular;  Laterality: N/A;   Family History  Problem Relation Age of Onset  . Alzheimer's disease Mother   . Renal Disease Father   . Coronary artery disease Brother    History  Substance Use Topics  . Smoking status: Never Smoker   . Smokeless tobacco: Never Used  Comment: never used tobacco  . Alcohol Use: No   OB History    No data available     Review of Systems  All other systems reviewed and are negative.   Allergies  Codeine and Zofran  Home Medications   Prior to Admission medications   Medication Sig Start Date End Date Taking? Authorizing Provider  acetaminophen (TYLENOL) 500 MG tablet Take 500 mg by mouth every 6 (six) hours as needed for mild pain, moderate pain, fever or headache.   Yes  Historical Provider, MD  b complex vitamins capsule Take 1 capsule by mouth every other day. Alternating days with Super B complex   Yes Historical Provider, MD  B Complex-C (SUPER B COMPLEX PO) Take 1 tablet by mouth every other day. Alternating days with B complex   Yes Historical Provider, MD  Biotin 5 MG TABS Take by mouth every morning.   Yes Historical Provider, MD  Calcium-Magnesium-Vitamin D (CALCIUM MAGNESIUM PO) Take 1 tablet by mouth daily.   Yes Historical Provider, MD  Cholecalciferol (VITAMIN D) 2000 UNITS tablet Take 2,000 Units by mouth daily with breakfast.   Yes Historical Provider, MD  diphenoxylate-atropine (LOMOTIL) 2.5-0.025 MG per tablet Take 1 tablet by mouth 2 (two) times daily. Take one tablet by mouth once daily 01/30/13  Yes Estill Dooms, MD  feeding supplement, ENSURE COMPLETE, (ENSURE COMPLETE) LIQD Take 237 mLs by mouth 2 (two) times daily between meals. [name]* 01/01/13  Yes Josetta Huddle, MD  Multiple Vitamin (MULTIVITAMIN WITH MINERALS) TABS tablet Take 1 tablet by mouth daily.   Yes Historical Provider, MD  Omega 3 1000 MG CAPS Take 1 capsule by mouth daily with breakfast.    Yes Historical Provider, MD  Probiotic Product (ALIGN PO) Take 1 capsule by mouth daily with breakfast.    Yes Historical Provider, MD  UNABLE TO FIND Take by mouth every morning. SUPER ANTIOXIDENT FORMULA   Yes Historical Provider, MD  cyclobenzaprine (FLEXERIL) 5 MG tablet Take 1 tablet (5 mg total) by mouth 3 (three) times daily as needed for muscle spasms. Patient not taking: Reported on 03/06/2014 11/02/13   Volanda Napoleon, MD   BP 134/58 mmHg  Pulse 83  Temp(Src) 98.6 F (37 C) (Oral)  Resp 16  SpO2 95% Physical Exam  Constitutional: She is oriented to person, place, and time. She appears well-developed and well-nourished.  HENT:  Head: Normocephalic and atraumatic.  Eyes: Pupils are equal, round, and reactive to light.  Neck: Normal range of motion. Neck supple. No JVD present.  No tracheal deviation present. No thyromegaly present.  Cardiovascular: Normal rate, regular rhythm, normal heart sounds and intact distal pulses.  Exam reveals no gallop and no friction rub.   No murmur heard. Pulmonary/Chest: Effort normal and breath sounds normal. No stridor. No respiratory distress. She has no wheezes. She has no rales. She exhibits no tenderness.  Abdominal: Soft. Bowel sounds are normal. She exhibits no distension and no mass. There is no rebound and no guarding.  Mild tenderness to the right lower quadrant, no rebound, guarding. Ileostomy bag green bile,no surrounding signs of infection  Musculoskeletal: Normal range of motion.  Lymphadenopathy:    She has no cervical adenopathy.  Neurological: She is alert and oriented to person, place, and time. Coordination normal.  Skin: Skin is warm and dry.  Psychiatric: She has a normal mood and affect. Her behavior is normal. Judgment and thought content normal.  Nursing note and vitals reviewed.   ED Course  Procedures (including critical care time) Labs Review Labs Reviewed  CBC WITH DIFFERENTIAL/PLATELET - Abnormal; Notable for the following:    RBC 3.83 (*)    MCV 102.1 (*)    MCH 34.2 (*)    Platelets 120 (*)    Neutrophils Relative % 92 (*)    Lymphocytes Relative 5 (*)    Lymphs Abs 0.4 (*)    All other components within normal limits  COMPREHENSIVE METABOLIC PANEL - Abnormal; Notable for the following:    Glucose, Bld 132 (*)    BUN 29 (*)    GFR calc non Af Amer 49 (*)    GFR calc Af Amer 56 (*)    All other components within normal limits  LIPASE, BLOOD  I-STAT CG4 LACTIC ACID, ED  POC OCCULT BLOOD, ED    Imaging Review No results found.   EKG Interpretation   Date/Time:  Monday April 15 2014 16:01:48 EDT Ventricular Rate:  82 PR Interval:  143 QRS Duration: 76 QT Interval:  354 QTC Calculation: 413 R Axis:   62 Text Interpretation:  Sinus rhythm Left atrial enlargement RSR' in V1 or  V2,  probably normal variant No significant change since last tracing  Confirmed by ALLEN  MD, ANTHONY ([id number]) on 04/15/2014 5:02:44 PM      MDM   Final diagnoses:  Dehydration    Consults: Hospitalist service  Therapeutics:[place], NaCl  Assessment/Plan: Pt dehydrated with an increase in ostomy output. Pt is stable, non toxic. Dr. Zenia Resides consulted hospitalist service with patient admit.    Okey Regal, PA-C 04/17/14 1442

## 2014-04-15 NOTE — ED Notes (Signed)
Pt was unable to complete standing orthostatic vs. Pt stated she was light headed.

## 2014-04-15 NOTE — H&P (Signed)
. [facility] History and Physical  Tamara [name] QMG:[id number] DOB: 1929/07/23 DOA: 04/15/2014  Referring physician: ED PCP: Henrine Screws, MD  Specialists: none  Chief Complaint: N/V/Diar  HPI: 81 y/o ? 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  1/7 h/o Diarrhea starting 3/20.  No blood, -usually on probiotics +/- Imodium.  Ate at [place] yesterday felt like the pork May of been questionable-tolerated it fine but then awoke this morning with having to change her colostomy multiple times. He usually changes her colostomy only once to twice have changed about 3 or 4 times since coming to the emergency room Denies chills, - fever, - ill contact - vomiting - Recent antibiotic exposure, -  PPI use Has felt nauseous No falls but feeling "weak" No shocks or other issues from pacemaker Denies chest pain  Note that patient was able to ambulate into the emergency room but subsequently has felt weaker and weaker after multiple episodes of diarrhea  Emergency room workup revealed no specific lab abnormalities Orthostatics were difficult to obtain given patient's weakness  Review of Systems:  A 14 point ROS was performed and is negative except as noted in the HPI   Past Medical History  Diagnosis Date  . HTN (hypertension) August 2014  . Syncope and collapse August 2014    sinus pauses/ PTVDP  . Colitis   . Breast cancer   . Colon cancer 02/21/2013   Past Surgical History  Procedure Laterality Date  . Breast lumpectomy    . Cholecystectomy    . Tonsillectomy    . Pacemaker insertion  08/28/12    MDT  . Flexible sigmoidoscopy N/A 12/06/2012    Procedure: FLEXIBLE SIGMOIDOSCOPY;   Surgeon: Lear Ng, MD;  Location: [place];  Service: Endoscopy;  Laterality: N/A;  . Laparoscopic partial colectomy N/A 12/07/2012    Procedure: LAPAROSCOPIC-ASSISTED TRANSVERSE COLECTOMY;  Surgeon: Joyice Faster. Cornett, MD;  Location: [place];  Service: General;  Laterality: N/A;  . Colonoscopy N/A 12/15/2012    Procedure: COLONOSCOPY;  Surgeon: Jeryl Columbia, MD;  Location: [facility] ENDOSCOPY;  Service: Endoscopy;  Laterality: N/A;  . Bowel decompression N/A 12/15/2012    Procedure: BOWEL DECOMPRESSION;  Surgeon: Jeryl Columbia, MD;  Location: [facility] ENDOSCOPY;  Service: Endoscopy;  Laterality: N/A;  . Colon resection N/A 12/22/2012    Procedure: EXPLORATORY LAPAROTOMY , RIGHT Colectomy IILEOSTOMY;  Surgeon: Rolm Bookbinder, MD;  Location: [place];  Service: General;  Laterality: N/A;  . Left heart catheterization with coronary angiogram Bilateral 08/26/2012    Procedure: LEFT HEART CATHETERIZATION WITH CORONARY ANGIOGRAM;  Surgeon: Lorretta Harp, MD;  Location: [facility] CATH LAB;  Service: Cardiovascular;  Laterality: Bilateral;  . Permanent pacemaker insertion N/A 08/28/2012    Procedure: PERMANENT PACEMAKER INSERTION;  Surgeon: Sanda Klein, MD;  Location: [place] CATH LAB;  Service: Cardiovascular;  Laterality: N/A;   Social History:  History   Social History Narrative    Allergies  Allergen Reactions  . Codeine Nausea And Vomiting  . Zofran [Ondansetron Hcl] Nausea And Vomiting    Family History  Problem Relation Age of Onset  . Alzheimer's disease Mother   . Renal Disease Father   . Coronary  artery disease Brother     Prior to Admission medications   Medication Sig Start Date End Date Taking? Authorizing Provider  acetaminophen (TYLENOL) 500 MG tablet Take 500 mg by mouth every 6 (six) hours as needed for mild pain, moderate pain, fever or headache.   Yes Historical Provider, MD  b complex vitamins capsule Take 1 capsule by mouth every other day. Alternating days with Super B complex    Yes Historical Provider, MD  B Complex-C (SUPER B COMPLEX PO) Take 1 tablet by mouth every other day. Alternating days with B complex   Yes Historical Provider, MD  Biotin 5 MG TABS Take by mouth every morning.   Yes Historical Provider, MD  Calcium-Magnesium-Vitamin D (CALCIUM MAGNESIUM PO) Take 1 tablet by mouth daily.   Yes Historical Provider, MD  Cholecalciferol (VITAMIN D) 2000 UNITS tablet Take 2,000 Units by mouth daily with breakfast.   Yes Historical Provider, MD  diphenoxylate-atropine (LOMOTIL) 2.5-0.025 MG per tablet Take 1 tablet by mouth 2 (two) times daily. Take one tablet by mouth once daily 01/30/13  Yes Estill Dooms, MD  feeding supplement, ENSURE COMPLETE, (ENSURE COMPLETE) LIQD Take 237 mLs by mouth 2 (two) times daily between meals. [name]* 01/01/13  Yes Josetta Huddle, MD  Multiple Vitamin (MULTIVITAMIN WITH MINERALS) TABS tablet Take 1 tablet by mouth daily.   Yes Historical Provider, MD  Omega 3 1000 MG CAPS Take 1 capsule by mouth daily with breakfast.    Yes Historical Provider, MD  Probiotic Product (ALIGN PO) Take 1 capsule by mouth daily with breakfast.    Yes Historical Provider, MD  UNABLE TO FIND Take by mouth every morning. SUPER ANTIOXIDENT FORMULA   Yes Historical Provider, MD  cyclobenzaprine (FLEXERIL) 5 MG tablet Take 1 tablet (5 mg total) by mouth 3 (three) times daily as needed for muscle spasms. Patient not taking: Reported on 03/06/2014 11/02/13   Volanda Napoleon, MD   Physical Exam: Filed Vitals:   04/15/14 1235 04/15/14 1459 04/15/14 1700  BP: 146/72 134/58 135/63  Pulse: 86 83 80  Temp: 98.6 F (37 C)    TempSrc: Oral    Resp:  16 15  SpO2: 100% 95% 94%    Alert in distress and tearful   EOMI NCAT, no pallor no(  throat is clear Neck is soft supple, mucosa is dry S1-S2 no murmur rub or gallop Colostomy in place with liquid-like stool No focal neurological deficit moving all 4 limbs equally Midline abdominal scar   Labs on Admission:   Basic Metabolic Panel:  Recent Labs Lab 04/15/14 1325  NA 137  K 4.6  CL 107  CO2 20  GLUCOSE 132*  BUN 29*  CREATININE 1.04  CALCIUM 9.8   Liver Function Tests:  Recent Labs Lab 04/15/14 1325  AST 35  ALT 34  ALKPHOS 105  BILITOT 0.7  PROT 8.0  ALBUMIN 4.4    Recent Labs Lab 04/15/14 1325  LIPASE 25   No results for input(s): AMMONIA in the last 168 hours. CBC:  Recent Labs Lab 04/15/14 1325  WBC 7.5  NEUTROABS 6.8  HGB 13.1  HCT 39.1  MCV 102.1*  PLT 120*   Cardiac Enzymes: No results for input(s): CKTOTAL, CKMB, CKMBINDEX, TROPONINI in the last 168 hours.  BNP (last 3 results) No results for input(s): BNP in the last 8760 hours.  ProBNP (last 3 results) No results for input(s): PROBNP in the last 8760 hours.  CBG: No results for input(s): GLUCAP in  the last 168 hours.  Radiological Exams on Admission: No results found.  EKG: Independently reviewed. Sinus rhythm-PR interval 0.12 QRS axis 45 no ST-T wave changes borderline criteria for LVH with incomplete RSR prime  Assessment/Plan Active Problems:   Dehydration-Will volume replete with 7 5 cc per hour. Already receiving saline bolus. Repeat labs complete metabolic in the morning   Viral gastroenteritis-rule out C. difficile colitis-if negative can use Imodium to slow flow of diarrhea as does not have bloody stools so this would be reasonable. Would consider viral pathogen panel if this does not slow down.   History of multiple cancers-stage IIA ductal carcinoma breast, DCIS right rest 2008, colon cancer IIIB status post surgery-relatively immunocompromised secondary to these issues however good ECOG status. 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  55 minutes Full code presumed Updated has been at bedside   Nita Sells [facility] Pager  [phone]   If 7PM-7AM, please contact night-coverage [email or web address] Password [facility] 04/15/2014, 5:39 PM

## 2014-04-15 NOTE — ED Notes (Signed)
Pacemaker has been interrogated. Waiting for results. 

## 2014-04-15 NOTE — ED Provider Notes (Signed)
Medical screening examination/treatment/procedure(s) were conducted as a shared visit with non-physician practitioner(s) and myself.  I personally evaluated the patient during the encounter.   EKG Interpretation   Date/Time:  Monday April 15 2014 16:01:48 EDT Ventricular Rate:  82 PR Interval:  143 QRS Duration: 76 QT Interval:  354 QTC Calculation: 413 R Axis:   62 Text Interpretation:  Sinus rhythm Left atrial enlargement RSR' in V1 or  V2, probably normal variant No significant change since last tracing  Confirmed by Meilani Edmundson  MD, Paisely Brick ([id number]) on 04/15/2014 5:02:44 PM     Patient here with increased weakness and increased output from her ostomy bag. Feels diffusely weak has been nonfocal. Given IV fluids here 1 L and remains with unchanged symptoms. Will consult hospitalist for admission  Lacretia Leigh, MD 04/15/14 [phone]

## 2014-04-15 NOTE — ED Notes (Signed)
PT CHANGE BED ASSIGNMENT TO 1423. BED SIDE REPORT GIVEN

## 2014-04-15 NOTE — ED Notes (Signed)
MD at bedside. ADMITTING MD PRESENT TO EVALUATE THIS PT

## 2014-04-15 NOTE — ED Notes (Signed)
MD at bedside. EDPA JEFF PRESENT TO EVALUATE THIS PT

## 2014-04-16 DIAGNOSIS — A084 Viral intestinal infection, unspecified: Secondary | ICD-10-CM | POA: Diagnosis not present

## 2014-04-16 DIAGNOSIS — E860 Dehydration: Secondary | ICD-10-CM | POA: Diagnosis not present

## 2014-04-16 LAB — CBC
HCT: 40.5 % (ref 36.0–46.0)
Hemoglobin: 13.5 g/dL (ref 12.0–15.0)
MCH: 34.4 pg — ABNORMAL HIGH (ref 26.0–34.0)
MCHC: 33.3 g/dL (ref 30.0–36.0)
MCV: 103.3 fL — ABNORMAL HIGH (ref 78.0–100.0)
Platelets: 121 10*3/uL — ABNORMAL LOW (ref 150–400)
RBC: 3.92 MIL/uL (ref 3.87–5.11)
RDW: 13.5 % (ref 11.5–15.5)
WBC: 5.4 10*3/uL (ref 4.0–10.5)

## 2014-04-16 LAB — COMPREHENSIVE METABOLIC PANEL
ALT: 34 U/L (ref 0–35)
ANION GAP: 11 (ref 5–15)
AST: 34 U/L (ref 0–37)
Albumin: 4.5 g/dL (ref 3.5–5.2)
Alkaline Phosphatase: 98 U/L (ref 39–117)
BUN: 34 mg/dL — AB (ref 6–23)
CO2: 17 mmol/L — ABNORMAL LOW (ref 19–32)
Calcium: 9.3 mg/dL (ref 8.4–10.5)
Chloride: 105 mmol/L (ref 96–112)
Creatinine, Ser: 1.25 mg/dL — ABNORMAL HIGH (ref 0.50–1.10)
GFR calc non Af Amer: 39 mL/min — ABNORMAL LOW (ref >90)
GFR, EST AFRICAN AMERICAN: 45 mL/min — AB (ref >90)
GLUCOSE: 125 mg/dL — AB (ref 70–99)
Potassium: 4.2 mmol/L (ref 3.5–5.1)
SODIUM: 133 mmol/L — AB (ref 135–145)
TOTAL PROTEIN: 7.9 g/dL (ref 6.0–8.3)
Total Bilirubin: 0.7 mg/dL (ref 0.3–1.2)

## 2014-04-16 LAB — PROTIME-INR
INR: 1.11 (ref 0.00–1.49)
Prothrombin Time: 14.4 seconds (ref 11.6–15.2)

## 2014-04-16 MED ORDER — SODIUM BICARBONATE 650 MG PO TABS
650.0000 mg | ORAL_TABLET | Freq: Two times a day (BID) | ORAL | Status: DC
  Administered 2014-04-16 – 2014-04-18 (×5): 650 mg via ORAL
  Filled 2014-04-16 (×5): qty 1

## 2014-04-16 MED ORDER — LOPERAMIDE HCL 2 MG PO CAPS
2.0000 mg | ORAL_CAPSULE | Freq: Three times a day (TID) | ORAL | Status: AC | PRN
  Administered 2014-04-16 – 2014-04-17 (×3): 2 mg via ORAL
  Filled 2014-04-16 (×3): qty 1

## 2014-04-16 NOTE — Progress Notes (Signed)
BIANCE MONCRIEF [id number] DOB: 20-Oct-1929 DOA: 04/15/2014 PCP: Henrine Screws, MD  Brief narrative: 81 y/o ? 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   h/o Diarrhea starting 3/20. No blood, -usually on probiotics +/- Imodium. Ate at [place] " the pork May of been questionable"-tolerated it fine but then awoke this morning with having to change her colostomy multiple times.  usually changes her colostomy only once to twice have changed about 3 or 4 times since coming to the emergency room Denies chills, - fever, - ill contact - vomiting - Recent antibiotic exposure, - PPI use  Past medical history-As per Problem list Chart reviewed as below- Reviewed  Consultants:  None  Procedures:  None  Antibiotics:  None   Subjective   Sleepy but easily arousable Still feels poorly Not very hungry Tolerating Ensure according to nurse No chest pain no shortness of breath Complaining of tooth pain in the right molar   Objective    Interim History:   Telemetry: Sinus tachycardia occasionally   Objective: Filed Vitals:   04/15/14 1936 04/15/14 2047 04/16/14 0504 04/16/14 1427  BP:  125/57 132/67 130/60  Pulse:  92 92 82  Temp:  98.3 F (36.8 C) 98.6 F (37 C) 98.6 F (37 C)  TempSrc:  Oral Oral Oral  Resp:  20 18 20   Height: 5\' 6"  (1.676 m)     Weight: 58.06 kg (128 lb)     SpO2:  99% 97% 100%    Intake/Output Summary (Last 24 hours) at 04/16/14 1704 Last data filed at 04/16/14 1427  Gross per 24 hour  Intake   1320 ml  Output   4450 ml  Net  -3130 ml    Exam:  General: EOMI NCAT Cardiovascular: S1-S2 no murmur rub or gallop, slightly  tachycardic Respiratory: Clinically clear no added sound Abdomen: Soft nontender nondistended no rebound no guarding-ileostomy looks clean Skin no lower extremity edema Neuro intact  Data Reviewed: Basic Metabolic Panel:  Recent Labs Lab 04/15/14 1325 04/16/14 0455  NA 137 133*  K 4.6 4.2  CL 107 105  CO2 20 17*  GLUCOSE 132* 125*  BUN 29* 34*  CREATININE 1.04 1.25*  CALCIUM 9.8 9.3   Liver Function Tests:  Recent Labs Lab 04/15/14 1325 04/16/14 0455  AST 35 34  ALT 34 34  ALKPHOS 105 98  BILITOT 0.7 0.7  PROT 8.0 7.9  ALBUMIN 4.4 4.5    Recent Labs Lab 04/15/14 1325  LIPASE 25   No results for input(s): AMMONIA in the last 168 hours. CBC:  Recent Labs Lab 04/15/14 1325 04/16/14 0455  WBC 7.5 5.4  NEUTROABS 6.8  --   HGB 13.1 13.5  HCT 39.1 40.5  MCV 102.1* 103.3*  PLT 120* 121*   Cardiac Enzymes: No results for input(s): CKTOTAL, CKMB, CKMBINDEX, TROPONINI in the last 168 hours. BNP: Invalid input(s): POCBNP CBG: No results for input(s): GLUCAP in the last 168 hours.  Recent Results (from the past 240 hour(s))  Clostridium Difficile by PCR     Status: None   Collection Time: 04/15/14  6:10 PM  Result Value Ref Range Status   C difficile by pcr NEGATIVE NEGATIVE  Final     Studies:              All Imaging reviewed and is as per above notation   Scheduled Meds: . diphenoxylate-atropine  1 tablet Oral BID  . feeding supplement (ENSURE COMPLETE)  237 mL Oral BID BM  . heparin  5,000 Units Subcutaneous 3 times per day  . sodium bicarbonate  650 mg Oral BID  . sodium chloride  3 mL Intravenous Q12H   Continuous Infusions: . sodium chloride 100 mL/hr at 04/16/14 1242     Assessment/Plan: 1. Nausea vomiting-probable enteritis. If nonresolution would scan abdomen however clinically patient is doing better and was able to tolerate liquids and clears today although she still has abdominal pain and discomfort. Consider restarting probiotics in  a.m. 2. Metabolic acidosis non-anion gap-likely related to high out from ostomy as well as superimposed nausea vomiting causing concrement and loss of chloride-anion gap is 11. Bicarbonate is 17..  we have started bicarbonate 650 twice a day and we will continue sodium chloride at a rate of 100 cc per hour overnight. If there is significant further worsening, I would recommend nephrology input in the a.m. 3. Acute kidney injury-secondary potentially to above issues. Continue saline as above 4. History sinus arrest and bradycardia status post PPM-keep on telemetry overnight-discontinue telemetry 3/23 if no other issues 5. Stage II a ductal CA year 2000, DCIS right breast 2008, colon cancer stage IIIB T4 N1 [place]-status post colon resection and ostomy-outpatient reevaluation of by oncology 6. Hypertension-not on any meds apparently   Code Status: Full  Family Communication: None at bedside  Disposition Plan: Inpatient  Verneita Griffes, MD  [facility] Pager [phone] 04/16/2014, 5:04 PM

## 2014-04-16 NOTE — Progress Notes (Signed)
UR completed 

## 2014-04-17 DIAGNOSIS — R970 Elevated carcinoembryonic antigen [CEA]: Secondary | ICD-10-CM | POA: Diagnosis not present

## 2014-04-17 DIAGNOSIS — R112 Nausea with vomiting, unspecified: Secondary | ICD-10-CM

## 2014-04-17 DIAGNOSIS — C184 Malignant neoplasm of transverse colon: Secondary | ICD-10-CM | POA: Diagnosis not present

## 2014-04-17 DIAGNOSIS — R197 Diarrhea, unspecified: Secondary | ICD-10-CM

## 2014-04-17 DIAGNOSIS — E860 Dehydration: Secondary | ICD-10-CM | POA: Diagnosis not present

## 2014-04-17 DIAGNOSIS — N179 Acute kidney failure, unspecified: Secondary | ICD-10-CM | POA: Diagnosis not present

## 2014-04-17 LAB — CBC WITH DIFFERENTIAL/PLATELET
BASOS ABS: 0 10*3/uL (ref 0.0–0.1)
BASOS PCT: 0 % (ref 0–1)
Eosinophils Absolute: 0.1 10*3/uL (ref 0.0–0.7)
Eosinophils Relative: 1 % (ref 0–5)
HCT: 37.4 % (ref 36.0–46.0)
Hemoglobin: 12.3 g/dL (ref 12.0–15.0)
Lymphocytes Relative: 33 % (ref 12–46)
Lymphs Abs: 1.9 10*3/uL (ref 0.7–4.0)
MCH: 33.9 pg (ref 26.0–34.0)
MCHC: 32.9 g/dL (ref 30.0–36.0)
MCV: 103 fL — ABNORMAL HIGH (ref 78.0–100.0)
Monocytes Absolute: 1.2 10*3/uL — ABNORMAL HIGH (ref 0.1–1.0)
Monocytes Relative: 21 % — ABNORMAL HIGH (ref 3–12)
NEUTROS ABS: 2.5 10*3/uL (ref 1.7–7.7)
NEUTROS PCT: 44 % (ref 43–77)
PLATELETS: 105 10*3/uL — AB (ref 150–400)
RBC: 3.63 MIL/uL — ABNORMAL LOW (ref 3.87–5.11)
RDW: 13.5 % (ref 11.5–15.5)
WBC: 5.6 10*3/uL (ref 4.0–10.5)

## 2014-04-17 LAB — COMPREHENSIVE METABOLIC PANEL
ALBUMIN: 3.8 g/dL (ref 3.5–5.2)
ALT: 32 U/L (ref 0–35)
AST: 30 U/L (ref 0–37)
Alkaline Phosphatase: 79 U/L (ref 39–117)
Anion gap: 9 (ref 5–15)
BUN: 43 mg/dL — ABNORMAL HIGH (ref 6–23)
CALCIUM: 9 mg/dL (ref 8.4–10.5)
CO2: 17 mmol/L — ABNORMAL LOW (ref 19–32)
Chloride: 112 mmol/L (ref 96–112)
Creatinine, Ser: 1.5 mg/dL — ABNORMAL HIGH (ref 0.50–1.10)
GFR calc Af Amer: 36 mL/min — ABNORMAL LOW (ref >90)
GFR calc non Af Amer: 31 mL/min — ABNORMAL LOW (ref >90)
Glucose, Bld: 92 mg/dL (ref 70–99)
Potassium: 4.1 mmol/L (ref 3.5–5.1)
SODIUM: 138 mmol/L (ref 135–145)
TOTAL PROTEIN: 6.7 g/dL (ref 6.0–8.3)
Total Bilirubin: 0.7 mg/dL (ref 0.3–1.2)

## 2014-04-17 LAB — PROTIME-INR
INR: 1.06 (ref 0.00–1.49)
PROTHROMBIN TIME: 13.9 s (ref 11.6–15.2)

## 2014-04-17 LAB — BASIC METABOLIC PANEL
Anion gap: 10 (ref 5–15)
BUN: 37 mg/dL — ABNORMAL HIGH (ref 6–23)
CALCIUM: 9.3 mg/dL (ref 8.4–10.5)
CO2: 16 mmol/L — ABNORMAL LOW (ref 19–32)
CREATININE: 1.19 mg/dL — AB (ref 0.50–1.10)
Chloride: 112 mmol/L (ref 96–112)
GFR calc Af Amer: 48 mL/min — ABNORMAL LOW (ref >90)
GFR, EST NON AFRICAN AMERICAN: 41 mL/min — AB (ref >90)
GLUCOSE: 104 mg/dL — AB (ref 70–99)
Potassium: 4.1 mmol/L (ref 3.5–5.1)
SODIUM: 138 mmol/L (ref 135–145)

## 2014-04-17 MED ORDER — ZOLPIDEM TARTRATE 5 MG PO TABS
5.0000 mg | ORAL_TABLET | Freq: Once | ORAL | Status: AC
  Administered 2014-04-18: 5 mg via ORAL
  Filled 2014-04-17: qty 1

## 2014-04-17 MED ORDER — LACTATED RINGERS IV SOLN
INTRAVENOUS | Status: DC
  Administered 2014-04-17 (×2): via INTRAVENOUS

## 2014-04-17 MED ORDER — SACCHAROMYCES BOULARDII 250 MG PO CAPS
250.0000 mg | ORAL_CAPSULE | Freq: Two times a day (BID) | ORAL | Status: DC
  Administered 2014-04-17 – 2014-04-18 (×3): 250 mg via ORAL
  Filled 2014-04-17 (×3): qty 1

## 2014-04-17 NOTE — Progress Notes (Signed)
PROGRESS NOTE  Tamara [name] TFT:[id number] DOB: Oct 08, 1929 DOA: 04/15/2014 PCP: Henrine Screws, MD  HPI: 81 yo F with PPM [employer] 09/07/12 for sinus arrest and bradycardia, HTN, stage II a ductal CA 2000, DCIS R breast 2008, colon cancer stage IIIB [T4 N1 cM0] adenocarcinoma transverse colon status post resection and ostomy, admitted 3/21 with increased ostomy output after eating "questionable food" at [employer], dehydration and renal failure  Subjective / 24 H Interval events - feeling better, wants to eat more, no nausea/vomiting  Assessment/Plan: Active Problems:   Pacemaker implanted 08/28/12   Nausea and vomiting   Dehydration   Viral gastroenteritis   AKI (acute kidney injury)   Nausea vomiting  - probable enteritis. - she is clinically improving, advance diet to soft  Increased ostomy output - likely in the setting of gastroenteritis - add Florastor  - complete with GI pathogen panel. - if not improving will add antibiotics  AKI - mild worsening, still net negative as ostomy output more than intake  NAGMA  - due to GI losses - ringer's lactate 100 cc/h - continue bicarb  History sinus arrest and bradycardia status post PPM - stable on telemetry, discontinue tele  Stage II a ductal CA year 2000, DCIS right breast 2008, colon cancer stage IIIB T4 N1 MO-status post colon resection and ostomy - outpatient reevaluation of by oncology  Hypertension -not on any meds apparently   Diet: DIET SOFT Fluids: LR 100 cc/h DVT Prophylaxis: heparin  Code Status: Full Code Family Communication: none bedside  Disposition Plan: home when ready    Consultants:  None   Procedures:  None    Antibiotics  Anti-infectives    None       Studies  No results found.  Objective  Filed Vitals:   04/16/14 0504 04/16/14 1427 04/16/14 2128 04/17/14 0540  BP: 132/67 130/60 118/59 125/61  Pulse: 92 82 84 70  Temp: 98.6 F (37 C) 98.6 F (37 C) 97.5 F (36.4  C) 97.5 F (36.4 C)  TempSrc: Oral Oral Oral Oral  Resp: 18 20 18 18   Height:      Weight:      SpO2: 97% 100% 98% 100%    Intake/Output Summary (Last 24 hours) at 04/17/14 1200 Last data filed at 04/17/14 1156  Gross per 24 hour  Intake 2550.83 ml  Output   3675 ml  Net -1124.17 ml   Filed Weights   04/15/14 1936  Weight: 58.06 kg (128 lb)   Exam:  General:  NAD, pleasant  HEENT: no scleral icterus, PERRL  Cardiovascular: RRR without MRG, 2+ peripheral pulses, no edema  Respiratory: CTA biL, good air movement, no wheezing, no crackles, no rales  Abdomen: soft, non tender, BS +, no guarding  MSK/Extremities: no clubbing/cyanosis, no joint swelling  Skin: no rashes  Neuro: non focal  Data Reviewed: Basic Metabolic Panel:  Recent Labs Lab 04/15/14 1325 04/16/14 0455 04/17/14 0525  NA 137 133* 138  K 4.6 4.2 4.1  CL 107 105 112  CO2 20 17* 17*  GLUCOSE 132* 125* 92  BUN 29* 34* 43*  CREATININE 1.04 1.25* 1.50*  CALCIUM 9.8 9.3 9.0   Liver Function Tests:  Recent Labs Lab 04/15/14 1325 04/16/14 0455 04/17/14 0525  AST 35 34 30  ALT 34 34 32  ALKPHOS 105 98 79  BILITOT 0.7 0.7 0.7  PROT 8.0 7.9 6.7  ALBUMIN 4.4 4.5 3.8    Recent Labs Lab 04/15/14 1325  LIPASE 25  CBC:  Recent Labs Lab 04/15/14 1325 04/16/14 0455 04/17/14 0525  WBC 7.5 5.4 5.6  NEUTROABS 6.8  --  2.5  HGB 13.1 13.5 12.3  HCT 39.1 40.5 37.4  MCV 102.1* 103.3* 103.0*  PLT 120* 121* 105*   Recent Results (from the past 240 hour(s))  Clostridium Difficile by PCR     Status: None   Collection Time: 04/15/14  6:10 PM  Result Value Ref Range Status   C difficile by pcr NEGATIVE NEGATIVE Final   Scheduled Meds: . diphenoxylate-atropine  1 tablet Oral BID  . feeding supplement (ENSURE COMPLETE)  237 mL Oral BID BM  . heparin  5,000 Units Subcutaneous 3 times per day  . saccharomyces boulardii  250 mg Oral BID  . sodium bicarbonate  650 mg Oral BID  . sodium  chloride  3 mL Intravenous Q12H   Continuous Infusions: . lactated ringers 100 mL/hr at 04/17/14 0756    Marzetta Board, MD [facility] Pager [phone]. If 7 PM - 7 AM, please contact night-coverage at [email or web address], password [facility] 04/17/2014, 12:00 PM

## 2014-04-17 NOTE — Evaluation (Signed)
Physical Therapy One Time Evaluation Patient Details Name: Tamara [name] MRN: [id number] DOB: November 26, 1929 Today's Date: 04/17/2014   History of Present Illness  Pt is 81 y/o female with history of HTN, sinus arrest, bradycardia, colon cancer, and pacemaker implant in 2014. Pt presents with generalized weakness and dehydration.  Clinical Impression  Pt evaluated by Physical Therapy with no further acute PT needs identified. All education has been completed and the patient has no further questions. See below for any follow-up Physical Therapy or equipment needs. PT is signing off. Thank you for this referral.     Follow Up Recommendations No PT follow up    Equipment Recommendations  None recommended by PT    Recommendations for Other Services       Precautions / Restrictions Precautions Precautions: Fall Precaution Comments: Pt reports previous syncope episode prior to admission.  Restrictions Weight Bearing Restrictions: No      Mobility  Bed Mobility Overal bed mobility: Independent                Transfers Overall transfer level: Independent Equipment used: None                Ambulation/Gait Ambulation/Gait assistance: Supervision;Modified independent (Device/Increase time) Ambulation Distance (Feet): 260 Feet Assistive device: None Gait Pattern/deviations: WFL(Within Functional Limits)     General Gait Details: Pt has neck pain and has to turn whole upper body to look L and R during ambulation, pt states this as a chronic issue.   Stairs            Wheelchair Mobility    Modified Rankin (Stroke Patients Only)       Balance Overall balance assessment: No apparent balance deficits (not formally assessed)                                           Pertinent Vitals/Pain Pain Assessment: No/denies pain    Home Living Family/patient expects to be discharged to:: Private residence Living Arrangements: Spouse/significant  other   Type of Home: House Home Access: Level entry     Home Layout: One level Home Equipment: Environmental consultant - 2 wheels      Prior Function Level of Independence: Independent               Hand Dominance        Extremity/Trunk Assessment               Lower Extremity Assessment: Overall WFL for tasks assessed (Pt reports generalized weakness.)         Communication   Communication: No difficulties  Cognition Arousal/Alertness: Awake/alert Behavior During Therapy: WFL for tasks assessed/performed Overall Cognitive Status: Within Functional Limits for tasks assessed                      General Comments      Exercises        Assessment/Plan    PT Assessment Patent does not need any further PT services  PT Diagnosis Generalized weakness   PT Problem List    PT Treatment Interventions     PT Goals (Current goals can be found in the Care Plan section) Acute Rehab PT Goals PT Goal Formulation: All assessment and education complete, DC therapy    Frequency     Barriers to discharge        Co-evaluation  End of Session   Activity Tolerance: Patient tolerated treatment well Patient left: in chair;with call bell/phone within reach Nurse Communication: Mobility status (Aware patient was up in recliner)    Functional Assessment Tool Used: clinical observation Functional Limitation: Mobility: Walking and moving around Mobility: Walking and Moving Around Current Status [phone]): 0 percent impaired, limited or restricted Mobility: Walking and Moving Around Goal Status [phone]): 0 percent impaired, limited or restricted Mobility: Walking and Moving Around Discharge Status [phone]): 0 percent impaired, limited or restricted    Time: 0352-4818 PT Time Calculation (min) (ACUTE ONLY): 13 min   Charges:   PT Evaluation $Initial PT Evaluation Tier I: 1 Procedure     PT G Codes:   PT G-Codes **NOT FOR INPATIENT CLASS** Functional  Assessment Tool Used: clinical observation Functional Limitation: Mobility: Walking and moving around Mobility: Walking and Moving Around Current Status (H9093): 0 percent impaired, limited or restricted Mobility: Walking and Moving Around Goal Status (J1216): 0 percent impaired, limited or restricted Mobility: Walking and Moving Around Discharge Status [phone]): 0 percent impaired, limited or restricted    Oney Tatlock 04/17/2014, 12:14 PM Charlott Holler, SPT

## 2014-04-17 NOTE — Progress Notes (Signed)
UR completed 

## 2014-04-17 NOTE — Consult Note (Signed)
Referral MD  Reason for Referral: Dehydration and stage IIIB colon cancer the transverse colon   Chief Complaint  Patient presents with  . Diarrhea  . Weakness  . colostomy   : I got sick on Monday  HPI: Tamara [name] is well-known to me. She 81 year old white female. I probably see her for about 15 years. Most recently, in the past year and half, she was found to have locally advanced-stage IIIB-adenocarcinoma the transverse colon.  We just did a CT scan of her abdomen back in February. This was done because her CEA has been going up. Her last CEA was up to 24. There is CT scan did not show any obvious recurrent disease.  She has a colostomy. This is been working well. She said that she went to lunch on Sunday. She did not notice anything unusual with what she ate. On Monday, she had some dental work done. She had a cap that fell off a molar. She swallowed this but was able to bring it back up. Since then, she was not feeling too well.  She was subsequently admitted. Her labs all look that bad. She's had a lot of liquid output from her colostomy.  Is been no abdominal pain. She's had no vomiting. She's had no cough. His been no rashes. She's had no leg swelling.  She feels pretty good right now. She is getting IV fluids. Her renal function has been going up a little bit. Her creatinine today is 1.5.  She's had no fever. His been no bleeding. She's had no cough or shortness of breath.   Past Medical History  Diagnosis Date  . HTN (hypertension) August 2014  . Syncope and collapse August 2014    sinus pauses/ PTVDP  . Colitis   . Breast cancer   . Colon cancer 02/21/2013  :  Past Surgical History  Procedure Laterality Date  . Breast lumpectomy    . Cholecystectomy    . Tonsillectomy    . Pacemaker insertion  08/28/12    MDT  . Flexible sigmoidoscopy N/A 12/06/2012    Procedure: FLEXIBLE SIGMOIDOSCOPY;  Surgeon: Lear Ng, MD;  Location: [facility];  Service:  Endoscopy;  Laterality: N/A;  . Laparoscopic partial colectomy N/A 12/07/2012    Procedure: LAPAROSCOPIC-ASSISTED TRANSVERSE COLECTOMY;  Surgeon: Joyice Faster. Cornett, MD;  Location: [place];  Service: General;  Laterality: N/A;  . Colonoscopy N/A 12/15/2012    Procedure: COLONOSCOPY;  Surgeon: Jeryl Columbia, MD;  Location: [facility] ENDOSCOPY;  Service: Endoscopy;  Laterality: N/A;  . Bowel decompression N/A 12/15/2012    Procedure: BOWEL DECOMPRESSION;  Surgeon: Jeryl Columbia, MD;  Location: [facility] ENDOSCOPY;  Service: Endoscopy;  Laterality: N/A;  . Colon resection N/A 12/22/2012    Procedure: EXPLORATORY LAPAROTOMY , RIGHT Colectomy IILEOSTOMY;  Surgeon: Rolm Bookbinder, MD;  Location: [facility];  Service: General;  Laterality: N/A;  . Left heart catheterization with coronary angiogram Bilateral 08/26/2012    Procedure: LEFT HEART CATHETERIZATION WITH CORONARY ANGIOGRAM;  Surgeon: Lorretta Harp, MD;  Location: [facility] CATH LAB;  Service: Cardiovascular;  Laterality: Bilateral;  . Permanent pacemaker insertion N/A 08/28/2012    Procedure: PERMANENT PACEMAKER INSERTION;  Surgeon: Sanda Klein, MD;  Location: [facility] CATH LAB;  Service: Cardiovascular;  Laterality: N/A;  :   Current facility-administered medications:  .  0.9 %  sodium chloride infusion, , Intravenous, Continuous, Nita Sells, MD, Last Rate: 100 mL/hr at 04/16/14 2211 .  acetaminophen (TYLENOL) tablet 500 mg, 500 mg, Oral,  Q6H PRN, Nita Sells, MD, 500 mg at 04/16/14 1911 .  cyclobenzaprine (FLEXERIL) tablet 5 mg, 5 mg, Oral, TID PRN, Nita Sells, MD, 5 mg at 04/16/14 0546 .  diphenoxylate-atropine (LOMOTIL) 2.5-0.025 MG per tablet 1 tablet, 1 tablet, Oral, BID, Nita Sells, MD, 1 tablet at 04/16/14 2211 .  feeding supplement (ENSURE COMPLETE) (ENSURE COMPLETE) liquid 237 mL, 237 mL, Oral, BID BM, Nita Sells, MD, 237 mL at 04/16/14 1542 .  heparin injection 5,000 Units, 5,000 Units, Subcutaneous, 3 times per day,  Nita Sells, MD, 5,000 Units at 04/17/14 0558 .  loperamide (IMODIUM) capsule 2 mg, 2 mg, Oral, TID PRN, Gardiner Barefoot, NP, 2 mg at 04/16/14 1911 .  promethazine (PHENERGAN) tablet 12.5 mg, 12.5 mg, Oral, Q6H PRN, Nita Sells, MD .  sodium bicarbonate tablet 650 mg, 650 mg, Oral, BID, Nita Sells, MD, 650 mg at 04/16/14 2211 .  sodium chloride 0.9 % injection 3 mL, 3 mL, Intravenous, Q12H, Nita Sells, MD, 3 mL at 04/15/14 2200:  . diphenoxylate-atropine  1 tablet Oral BID  . feeding supplement (ENSURE COMPLETE)  237 mL Oral BID BM  . heparin  5,000 Units Subcutaneous 3 times per day  . sodium bicarbonate  650 mg Oral BID  . sodium chloride  3 mL Intravenous Q12H  :  Allergies  Allergen Reactions  . Codeine Nausea And Vomiting  . Zofran [Ondansetron Hcl] Nausea And Vomiting  :  Family History  Problem Relation Age of Onset  . Alzheimer's disease Mother   . Renal Disease Father   . Coronary artery disease Brother   :  History   Social History  . Marital Status: Married    Spouse Name: N/A  . Number of Children: N/A  . Years of Education: N/A   Occupational History  . Not on file.   Social History Main Topics  . Smoking status: Never Smoker   . Smokeless tobacco: Never Used     Comment: never used tobacco  . Alcohol Use: No  . Drug Use: No  . Sexual Activity: Not on file   Other Topics Concern  . Not on file   Social History Narrative  :  Pertinent items are noted in HPI.  Exam: Patient Vitals for the past 24 hrs:  BP Temp Temp src Pulse Resp SpO2  04/17/14 0540 125/61 mmHg 97.5 F (36.4 C) Oral 70 18 100 %  04/16/14 2128 (!) 118/59 mmHg 97.5 F (36.4 C) Oral 84 18 98 %  04/16/14 1427 130/60 mmHg 98.6 F (37 C) Oral 82 20 100 %    elderly but well-nourished white female in no obvious distress. Her vital signs show temperature 97.5. Pulse 70. Blood pressure 125/61. Head and neck exam shows no ocular or oral lesions. Is  no scleral icterus. There is no adenopathy in the neck. Lungs are clear. Cardiac exam regular rate and rhythm with no murmurs, rubs or bruits. She does have a pacemaker. Abdomen is soft. She has a colostomy in the right lower quadrant. There is basically liquid in the colostomy bag. This looks slightly bilious. There is no abdominal distention. She has slightly decreased bowel sounds. There is no guarding or rebound tenderness. Extremities shows no clubbing, cyanosis or edema. Skin exam no rashes. Neurological exam is nonfocal.    Recent Labs  04/16/14 0455 04/17/14 0525  WBC 5.4 5.6  HGB 13.5 12.3  HCT 40.5 37.4  PLT 121* 105*    Recent Labs  04/16/14 0455 04/17/14 0525  NA 133* 138  K 4.2 4.1  CL 105 112  CO2 17* 17*  GLUCOSE 125* 92  BUN 34* 43*  CREATININE 1.25* 1.50*  CALCIUM 9.3 9.0    Blood smear review: None  Pathology: None     Assessment and Plan: 81 year old white female. She is in with dehydration. She's on IV fluids. It is hard to to know whether not she had any type of gastroenteritis from food poisoning.  While she is in the hospital, I will check another CEA on her. I just having the back of my mind that somehow recurrent malignancy might be causing some of this. He another scanned did not show anything obvious a month ago, I just worry that the elevated CEA is indicative of recurrence. I suppose that she could have peritoneal carcinomatosis that we just do not pick up on scan.  Overall, she does look quite good. I really do have much else to offer or recommend as far as management. Her renal function probably needs to be watched.  Her platelet was down a little bit. This man to be watched.  I do appreciate the outstanding care that she is gotten from the hospitalist.  Tamara [name]  Psalm:   Tamara [name] 17:14

## 2014-04-18 DIAGNOSIS — E860 Dehydration: Secondary | ICD-10-CM | POA: Diagnosis not present

## 2014-04-18 DIAGNOSIS — R197 Diarrhea, unspecified: Secondary | ICD-10-CM | POA: Diagnosis not present

## 2014-04-18 DIAGNOSIS — N179 Acute kidney failure, unspecified: Secondary | ICD-10-CM | POA: Diagnosis not present

## 2014-04-18 DIAGNOSIS — A084 Viral intestinal infection, unspecified: Secondary | ICD-10-CM | POA: Diagnosis not present

## 2014-04-18 DIAGNOSIS — C184 Malignant neoplasm of transverse colon: Secondary | ICD-10-CM | POA: Diagnosis not present

## 2014-04-18 DIAGNOSIS — R112 Nausea with vomiting, unspecified: Secondary | ICD-10-CM | POA: Diagnosis not present

## 2014-04-18 DIAGNOSIS — R970 Elevated carcinoembryonic antigen [CEA]: Secondary | ICD-10-CM | POA: Diagnosis not present

## 2014-04-18 LAB — CEA: CEA: 69.2 ng/mL — AB (ref 0.0–4.7)

## 2014-04-18 MED ORDER — SACCHAROMYCES BOULARDII 250 MG PO CAPS: 250.0000 mg | ORAL_CAPSULE | Freq: Two times a day (BID) | ORAL | Status: DC

## 2014-04-18 NOTE — Progress Notes (Signed)
Ms. Tamara [name] is still having quite a bit of diarrhea. She has her colostomy which is working well.  Her CEA now is up to 69. She must have recurrent disease somewhere. Our scans a month ago did not show any obvious recurrent disease.  I probably will have to set her up with a PET scan when she is out of the hospital. This might give [place] an idea as to what is going on. I cannot think of anything else that would be causing the CEA to go up.  She's not hurting. She's seems to be eating okay. She's had no fever. There's been no cough. She's had no leg swelling. There is no bleeding.  Her vital signs are stable. Blood pressure is 122/56. Abdomen is soft. Colostomy is functioning with liquid stool. There is no guarding or rebound tenderness. There is no mass. There is no palpable liver or spleen tip. Lungs are clear. Cardiac exam regular rate and rhythm.  Again, I have to believe that she has recurrent disease. We have not yet found it. I don't know if this current clinical situation that she is and is related to recurrent disease. I don't think that repeating a CT scan would be helpful since she had a negative scan 1 month ago. Again when she is an outpatient, then we will do a PET scan on her.  Pete E.  Isaiah 53:1-12

## 2014-04-18 NOTE — Discharge Instructions (Signed)
Follow with Tamara [name],Tamara NEVILL, MD in 5-7 days  Please get a complete blood count and chemistry panel checked by your Primary MD at your next visit, and again as instructed by your Primary MD. Please get your medications reviewed and adjusted by your Primary MD.  Please request your Primary MD to go over all Hospital Tests and Procedure/Radiological results at the follow up, please get all Hospital records sent to your Prim MD by signing hospital release before you go home.  If you had Pneumonia of Lung problems at the Hospital: Please get a 2 view Chest X ray done in 6-8 weeks after hospital discharge or sooner if instructed by your Primary MD.  If you have Congestive Heart Failure: Please call your Cardiologist or Primary MD anytime you have any of the following symptoms:  1) 3 pound weight gain in 24 hours or 5 pounds in 1 week  2) shortness of breath, with or without a dry hacking cough  3) swelling in the hands, feet or stomach  4) if you have to sleep on extra pillows at night in order to breathe  Follow cardiac low salt diet and 1.5 lit/day fluid restriction.  If you have diabetes Accuchecks 4 times/day, Once in AM empty stomach and then before each meal. Log in all results and show them to your primary doctor at your next visit. If any glucose reading is under 80 or above 300 call your primary MD immediately.  If you have Seizure/Convulsions/Epilepsy: Please do not drive, operate heavy machinery, participate in activities at heights or participate in high speed sports until you have seen by Primary MD or a Neurologist and advised to do so again.  If you had Gastrointestinal Bleeding: Please ask your Primary MD to check a complete blood count within one week of discharge or at your next visit. Your endoscopic/colonoscopic biopsies that are pending at the time of discharge, will also need to followed by your Primary MD.  Get Medicines reviewed and adjusted. Please take all your  medications with you for your next visit with your Primary MD  Please request your Primary MD to go over all hospital tests and procedure/radiological results at the follow up, please ask your Primary MD to get all Hospital records sent to his/her office.  If you experience worsening of your admission symptoms, develop shortness of breath, life threatening emergency, suicidal or homicidal thoughts you must seek medical attention immediately by calling 911 or calling your MD immediately  if symptoms less severe.  You must read complete instructions/literature along with all the possible adverse reactions/side effects for all the Medicines you take and that have been prescribed to you. Take any new Medicines after you have completely understood and accpet all the possible adverse reactions/side effects.   Do not drive or operate heavy machinery when taking Pain medications.   Do not take more than prescribed Pain, Sleep and Anxiety Medications  Special Instructions: If you have smoked or chewed Tobacco  in the last 2 yrs please stop smoking, stop any regular Alcohol  and or any Recreational drug use.  Wear Seat belts while driving.  Please note You were cared for by a hospitalist during your hospital stay. If you have any questions about your discharge medications or the care you received while you were in the hospital after you are discharged, you can call the unit and asked to speak with the hospitalist on call if the hospitalist that took care of you is not available. Once  you are discharged, your primary care physician will handle any further medical issues. Please note that NO REFILLS for any discharge medications will be authorized once you are discharged, as it is imperative that you return to your primary care physician (or establish a relationship with a primary care physician if you do not have one) for your aftercare needs so that they can reassess your need for medications and monitor your  lab values.  You can reach the hospitalist office at phone [phone] or fax [phone]   If you do not have a primary care physician, you can call [phone] for a physician referral.  Activity: As tolerated with Full fall precautions use walker/cane & assistance as needed  Diet: regular  Disposition Home

## 2014-04-18 NOTE — Discharge Summary (Signed)
Physician Discharge Summary  Tamara [name] SNK:[id number] DOB: [birth year] DOA: 04/15/2014  PCP: Henrine Screws, MD  Admit date: 04/15/2014 Discharge date: 04/18/2014  Time spent: 35 minutes  Recommendations for Outpatient Follow-up:  1. Follow up with Dr. Inda Merlin in 1-2 weeks 2. Follow up with Dr. Marin Olp as scheduled   Discharge Diagnoses:  Active Problems:   Pacemaker implanted 08/28/12   Nausea and vomiting   Dehydration   Viral gastroenteritis   AKI (acute kidney injury)  Discharge Condition: stable  Diet recommendation: regular  Filed Weights   04/15/14 1936  Weight: 58.06 kg (128 lb)   History of present illness - per Dr. Verlon Au:  81 y/o ? 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 1/7 h/o Diarrhea starting 3/20. No blood, -usually on probiotics +/- Imodium. Ate at [place] yesterday felt like the pork May of been questionable-tolerated it fine but then awoke this morning with having to change her colostomy multiple times. He usually changes her colostomy only once to twice have changed about 3 or 4 times since coming to the emergency room Denies chills, - fever, - ill contact - vomiting - Recent antibiotic exposure, - PPI use Has felt nauseous No falls but feeling "weak" No shocks or other issues from pacemaker Denies chest pain Note that patient was able to ambulate into the emergency room but subsequently has felt weaker and weaker after multiple episodes of diarrhea Emergency room workup revealed no specific lab abnormalities Orthostatics were difficult to obtain given patient's weakness  Hospital Course:  Nausea vomiting - patient was admitted with nausea/vomiting and increased  ostomy output following eating at a restaurant, also her husband with transient GI discomfort and 2 day diarrhea, likely representing viral gastroenteritis. She clinically improved without further nausea or abdominal pain and on the day prior to discharge she was able to tolerate a regular diet without further symptoms. Her weakness has also completely resolved and patient is able to ambulate in the hallway without assistance.  Increased ostomy output - likely in the setting of gastroenteritis, improved, patient states that she usually has high output at home, worsened in the setting of gastroenteritis but now feels like she is back to baseline. I added Florastor which she is to continue for a week. GI Pathogen panel was ordered and is pending at the time of discharge. C diff was negative. AKI with mild non anion gap acidosis- likely in the setting of GI losses, renal function has been improving, patient tolerating po intake well; following 3/23 bloodwork, patient has refused further labs since she did not want to get stuck anymore. Recommend repeat BMP as an outpatient. History sinus arrest and bradycardia status post PPM - stable on telemetry, discontinue tele Stage II a ductal CA year 2000, DCIS right breast 2008, colon cancer stage IIIB T4 N1 MO-status post colon resection and ostomy - outpatient reevaluation of by oncology  Procedures:  None    Consultations:  Medical Oncology, Dr. Marin Olp  Discharge Exam: Filed Vitals:   04/17/14 1520 04/17/14 2026 04/18/14 0506 04/18/14 1347  BP: 120/60 122/50 122/56 125/62  Pulse: 76 75 69 78  Temp: 97.5 F (36.4 C) 98.1 F (36.7 C) 97.5 F (  36.4 C) 98.1 F (36.7 C)  TempSrc: Oral Oral Oral Oral  Resp: 20 18 16 18   Height:      Weight:      SpO2: 100% 100% 100% 100%   General: NAD Cardiovascular: RRR Respiratory: CTA biL GI: abdomen soft, ostomy bag in place, no tenderness, BS +   Discharge Instructions    Medication List    TAKE  these medications        acetaminophen 500 MG tablet  Commonly known as:  TYLENOL  Take 500 mg by mouth every 6 (six) hours as needed for mild pain, moderate pain, fever or headache.     ALIGN PO  Take 1 capsule by mouth daily with breakfast.     b complex vitamins capsule  Take 1 capsule by mouth every other day. Alternating days with Super B complex     Biotin 5 MG Tabs  Take by mouth every morning.     CALCIUM MAGNESIUM PO  Take 1 tablet by mouth daily.     cyclobenzaprine 5 MG tablet  Commonly known as:  FLEXERIL  Take 1 tablet (5 mg total) by mouth 3 (three) times daily as needed for muscle spasms.     diphenoxylate-atropine 2.5-0.025 MG per tablet  Commonly known as:  LOMOTIL  Take 1 tablet by mouth 2 (two) times daily. Take one tablet by mouth once daily     feeding supplement (ENSURE COMPLETE) Liqd  Take 237 mLs by mouth 2 (two) times daily between meals. *Vanilla*     multivitamin with minerals Tabs tablet  Take 1 tablet by mouth daily.     Omega 3 1000 MG Caps  Take 1 capsule by mouth daily with breakfast.     saccharomyces boulardii 250 MG capsule  Commonly known as:  FLORASTOR  Take 1 capsule (250 mg total) by mouth 2 (two) times daily.     SUPER B COMPLEX PO  Take 1 tablet by mouth every other day. Alternating days with B complex     UNABLE TO FIND  Take by mouth every morning. SUPER ANTIOXIDENT FORMULA     Vitamin D 2000 UNITS tablet  Take 2,000 Units by mouth daily with breakfast.       Follow-up Information    Follow up with GATES,ROBERT NEVILL, MD. Schedule an appointment as soon as possible for a visit in 2 weeks.   Specialty:  Internal Medicine   Contact information:   [address] [address] [phone]       Follow up with Volanda Napoleon, MD. Schedule an appointment as soon as possible for a visit in 1 month.   Specialty:  Oncology   Contact information:   [place], [address] [place]  [id number] [phone]       The results of significant diagnostics from this hospitalization (including imaging, microbiology, ancillary and laboratory) are listed below for reference.    Significant Diagnostic Studies: No results found.  Microbiology: Recent Results (from the past 240 hour(s))  Clostridium Difficile by PCR     Status: None   Collection Time: 04/15/14  6:10 PM  Result Value Ref Range Status   C difficile by pcr NEGATIVE NEGATIVE Final    Labs: Basic Metabolic Panel:  Recent Labs Lab 04/15/14 1325 04/16/14 0455 04/17/14 0525 04/17/14 1558  NA 137 133* 138 138  K 4.6 4.2 4.1 4.1  CL 107 105 112 112  CO2 20 17* 17* 16*  GLUCOSE  132* 125* 92 104*  BUN 29* 34* 43* 37*  CREATININE 1.04 1.25* 1.50* 1.19*  CALCIUM 9.8 9.3 9.0 9.3   Liver Function Tests:  Recent Labs Lab 04/15/14 1325 04/16/14 0455 04/17/14 0525  AST 35 34 30  ALT 34 34 32  ALKPHOS 105 98 79  BILITOT 0.7 0.7 0.7  PROT 8.0 7.9 6.7  ALBUMIN 4.4 4.5 3.8    Recent Labs Lab 04/15/14 1325  LIPASE 25   CBC:  Recent Labs Lab 04/15/14 1325 04/16/14 0455 04/17/14 0525  WBC 7.5 5.4 5.6  NEUTROABS 6.8  --  2.5  HGB 13.1 13.5 12.3  HCT 39.1 40.5 37.4  MCV 102.1* 103.3* 103.0*  PLT 120* 121* 105*   Signed:  Abdou [name]  [facility] 04/18/2014, 3:21 PM

## 2014-04-19 ENCOUNTER — Other Ambulatory Visit: Payer: Self-pay | Admitting: Hematology & Oncology

## 2014-04-19 ENCOUNTER — Telehealth: Payer: Self-pay | Admitting: Hematology & Oncology

## 2014-04-19 DIAGNOSIS — C189 Malignant neoplasm of colon, unspecified: Secondary | ICD-10-CM

## 2014-04-19 LAB — GI PATHOGEN PANEL BY PCR, STOOL
C DIFFICILE TOXIN A/B: NOT DETECTED
Campylobacter by PCR: NOT DETECTED
Cryptosporidium by PCR: NOT DETECTED
E COLI (STEC): NOT DETECTED
E coli (ETEC) LT/ST: NOT DETECTED
E coli 0157 by PCR: NOT DETECTED
G lamblia by PCR: NOT DETECTED
Rotavirus A by PCR: NOT DETECTED
Salmonella by PCR: NOT DETECTED
Shigella by PCR: NOT DETECTED

## 2014-04-19 NOTE — Telephone Encounter (Signed)
Pt aware of 4-4 PET to be NPO 6 hrs and 4-7 MD appointment has been moved to 4-15

## 2014-04-25 ENCOUNTER — Telehealth: Payer: Self-pay | Admitting: Hematology & Oncology

## 2014-04-25 ENCOUNTER — Encounter: Payer: Self-pay | Admitting: Nurse Practitioner

## 2014-04-25 NOTE — Telephone Encounter (Signed)
Per RN I called pt gave her number to call and r/s PET on 4-4 she is aware it has to be done before 4-15 or we have to move MD appointment

## 2014-04-25 NOTE — Progress Notes (Signed)
Patient called stating she had her wisdom teeth extracted and was having complications. She is starting on a different antibiotic and will not be able to have PET scan done on 04/29/14. Dr. Marin Olp aware. This will get rescheduled. Rick in scheduling made aware too.

## 2014-04-29 ENCOUNTER — Ambulatory Visit (HOSPITAL_COMMUNITY): Payer: Medicare Other

## 2014-05-02 ENCOUNTER — Other Ambulatory Visit: Payer: Medicare Other

## 2014-05-02 ENCOUNTER — Ambulatory Visit: Payer: Medicare Other | Admitting: Family

## 2014-05-09 ENCOUNTER — Ambulatory Visit (HOSPITAL_COMMUNITY)
Admission: RE | Admit: 2014-05-09 | Discharge: 2014-05-09 | Disposition: A | Discharge status: Home / Self Care | Payer: Medicare Other | Source: Ambulatory Visit | Attending: Hematology & Oncology | Admitting: Hematology & Oncology

## 2014-05-09 ENCOUNTER — Other Ambulatory Visit: Payer: Self-pay | Admitting: Hematology & Oncology

## 2014-05-09 DIAGNOSIS — K5730 Diverticulosis of large intestine without perforation or abscess without bleeding: Secondary | ICD-10-CM | POA: Insufficient documentation

## 2014-05-09 DIAGNOSIS — Z933 Colostomy status: Secondary | ICD-10-CM | POA: Insufficient documentation

## 2014-05-09 DIAGNOSIS — C189 Malignant neoplasm of colon, unspecified: Secondary | ICD-10-CM | POA: Diagnosis not present

## 2014-05-09 DIAGNOSIS — Z853 Personal history of malignant neoplasm of breast: Secondary | ICD-10-CM | POA: Diagnosis not present

## 2014-05-09 DIAGNOSIS — Z79899 Other long term (current) drug therapy: Secondary | ICD-10-CM | POA: Insufficient documentation

## 2014-05-09 LAB — GLUCOSE, CAPILLARY: GLUCOSE-CAPILLARY: 91 mg/dL (ref 70–99)

## 2014-05-09 MED ORDER — FLUDEOXYGLUCOSE F - 18 (FDG) INJECTION
6.1000 | Freq: Once | INTRAVENOUS | Status: AC | PRN
  Administered 2014-05-09: 6.1 via INTRAVENOUS

## 2014-05-10 ENCOUNTER — Other Ambulatory Visit (HOSPITAL_BASED_OUTPATIENT_CLINIC_OR_DEPARTMENT_OTHER): Payer: Medicare Other

## 2014-05-10 ENCOUNTER — Ambulatory Visit (HOSPITAL_BASED_OUTPATIENT_CLINIC_OR_DEPARTMENT_OTHER): Payer: Medicare Other | Admitting: Hematology & Oncology

## 2014-05-10 VITALS — BP 154/72 | HR 86 | Temp 98.2°F | Resp 18 | Wt 127.0 lb

## 2014-05-10 DIAGNOSIS — C184 Malignant neoplasm of transverse colon: Secondary | ICD-10-CM | POA: Diagnosis not present

## 2014-05-10 DIAGNOSIS — C189 Malignant neoplasm of colon, unspecified: Secondary | ICD-10-CM

## 2014-05-10 LAB — CMP (CANCER CENTER ONLY)
ALT(SGPT): 28 U/L (ref 10–47)
AST: 29 U/L (ref 11–38)
Albumin: 3.8 g/dL (ref 3.3–5.5)
Alkaline Phosphatase: 94 U/L — ABNORMAL HIGH (ref 26–84)
BILIRUBIN TOTAL: 0.6 mg/dL (ref 0.20–1.60)
BUN, Bld: 25 mg/dL — ABNORMAL HIGH (ref 7–22)
CALCIUM: 9.8 mg/dL (ref 8.0–10.3)
CHLORIDE: 104 meq/L (ref 98–108)
CO2: 27 meq/L (ref 18–33)
CREATININE: 1.1 mg/dL (ref 0.6–1.2)
GLUCOSE: 98 mg/dL (ref 73–118)
Potassium: 4.3 mEq/L (ref 3.3–4.7)
Sodium: 140 mEq/L (ref 128–145)
Total Protein: 7.7 g/dL (ref 6.4–8.1)

## 2014-05-10 LAB — CBC WITH DIFFERENTIAL (CANCER CENTER ONLY)
BASO#: 0 10*3/uL (ref 0.0–0.2)
BASO%: 0.4 % (ref 0.0–2.0)
EOS%: 2 % (ref 0.0–7.0)
Eosinophils Absolute: 0.1 10*3/uL (ref 0.0–0.5)
HEMATOCRIT: 33.5 % — AB (ref 34.8–46.6)
HGB: 11.4 g/dL — ABNORMAL LOW (ref 11.6–15.9)
LYMPH#: 2.1 10*3/uL (ref 0.9–3.3)
LYMPH%: 37.1 % (ref 14.0–48.0)
MCH: 34.9 pg — ABNORMAL HIGH (ref 26.0–34.0)
MCHC: 34 g/dL (ref 32.0–36.0)
MCV: 102 fL — ABNORMAL HIGH (ref 81–101)
MONO#: 0.6 10*3/uL (ref 0.1–0.9)
MONO%: 10.5 % (ref 0.0–13.0)
NEUT#: 2.8 10*3/uL (ref 1.5–6.5)
NEUT%: 50 % (ref 39.6–80.0)
Platelets: 131 10*3/uL — ABNORMAL LOW (ref 145–400)
RBC: 3.27 10*6/uL — ABNORMAL LOW (ref 3.70–5.32)
RDW: 12.4 % (ref 11.1–15.7)
WBC: 5.6 10*3/uL (ref 3.9–10.0)

## 2014-05-10 LAB — CEA: CEA: 47.6 ng/mL — AB (ref 0.0–5.0)

## 2014-05-10 LAB — LACTATE DEHYDROGENASE: LDH: 143 U/L (ref 94–250)

## 2014-05-10 NOTE — Patient Instructions (Signed)
Tamara [name] Please see below your options for chemotherapy:  Option 1  -To receive IV chemotherapy in the office and then you would go home with a portable pump. The chemotherapy would be infused through a port a cath site. This portable pump would administer chemotherapy over the course of 2 days. On the second day you would then return to the office for the nurses to disconnect the pump. This treatment regimen would occur every 2 weeks.   Option 2  -You would receive chemotherapy in the office for one day. This would occur on an every 21 day schedule. In addition to the one day treatment you would also be required to take a chemotherapeutic pill at home. This pill is called Xeloda and you would take this pill twice per day for 14 days and then have a resting period for 7 days. After your 7 day resting period from the pill you would start the Xeloda 14 day cycle over again with the same parameters.

## 2014-05-10 NOTE — Progress Notes (Addendum)
Hematology and Oncology Follow Up Visit  Tamara [name] [id number] 1929-11-22 81 y.o. 05/10/2014   Principle Diagnosis:   Recurrent/metastatic colon cancer  Current Therapy:    Observation     Interim History:  Tamara [name] is back for follow-up. Unfortunately, I think we now have made a fairly convincing diagnosis of recurrent colon cancer.  When she was hospitalized recently with a gastroenteritis, she was found to have a CEA of 90. A CT scan in the hospital did not show anything  that was obvious for recurrence.  I then did get a PET scan on Tamara [name]. This was done on April 14. The PET scan showed tumor deposits along the anterior uterine fundus. Also noted was some activity at the rectosigmoid junction. The liver looked okay. There were no evidence of skeletal metastases.  She looks quite good. She feels good. She is eating well. She's not having any problems with pain. She's having no problems with Tamara [name] bladder. She has a colostomy which is intact.  There is no obvious weight loss or weight gain.  Overall, Tamara [name] performance status is ECOG 1.  Medications:  Current outpatient prescriptions:  .  acetaminophen (TYLENOL) 500 MG tablet, Take 500 mg by mouth every 6 (six) hours as needed for mild pain, moderate pain, fever or headache., Disp: , Rfl:  .  b complex vitamins capsule, Take 1 capsule by mouth every other day. Alternating days with Super B complex, Disp: , Rfl:  .  B Complex-C (SUPER B COMPLEX PO), Take 1 tablet by mouth every other day. Alternating days with B complex, Disp: , Rfl:  .  Biotin 5 MG TABS, Take by mouth every morning., Disp: , Rfl:  .  Calcium-Magnesium-Vitamin D (CALCIUM MAGNESIUM PO), Take 1 tablet by mouth daily., Disp: , Rfl:  .  Cholecalciferol (VITAMIN D) 2000 UNITS tablet, Take 2,000 Units by mouth daily with breakfast., Disp: , Rfl:  .  diphenoxylate-atropine (LOMOTIL) 2.5-0.025 MG per tablet, Take 1 tablet by mouth 2 (two) times daily. Take one tablet by  mouth once daily, Disp: , Rfl:  .  feeding supplement, ENSURE COMPLETE, (ENSURE COMPLETE) LIQD, Take 237 mLs by mouth 2 (two) times daily between meals. *Vanilla*, Disp: , Rfl:  .  Multiple Vitamin (MULTIVITAMIN WITH MINERALS) TABS tablet, Take 1 tablet by mouth daily., Disp: , Rfl:  .  Omega 3 1000 MG CAPS, Take 1 capsule by mouth daily with breakfast. , Disp: , Rfl:  .  Probiotic Product (ALIGN PO), Take 1 capsule by mouth daily with breakfast. , Disp: , Rfl:  .  saccharomyces boulardii (FLORASTOR) 250 MG capsule, Take 1 capsule (250 mg total) by mouth 2 (two) times daily., Disp: 20 capsule, Rfl: 0 .  UNABLE TO FIND, Take by mouth every morning. SUPER ANTIOXIDENT FORMULA, Disp: , Rfl:  .  cyclobenzaprine (FLEXERIL) 5 MG tablet, Take 1 tablet (5 mg total) by mouth 3 (three) times daily as needed for muscle spasms. (Patient not taking: Reported on 03/06/2014), Disp: 60 tablet, Rfl: 2  Allergies:  Allergies  Allergen Reactions  . Codeine Nausea And Vomiting  . Zofran [Ondansetron Hcl] Nausea And Vomiting  . Ciprofloxacin     Confusion/dizziness/fatigue  . Metronidazole     Confusion/dizziness/fatigue  . Skelaxin [Metaxalone]     Too strong  . Tetracycline Hcl     Not tolerated  . Avelox [Moxifloxacin] Rash    Past Medical History, Surgical history, Social history, and Family History were reviewed and updated.  Review of  Systems: As above  Physical Exam:  weight is 127 lb (57.607 kg). Tamara [name] oral temperature is 98.2 F (36.8 C). Tamara [name] blood pressure is 154/72 and Tamara [name] pulse is 86. Tamara [name] respiration is 18.   Wt Readings from Last 3 Encounters:  05/10/14 127 lb (57.607 kg)  04/15/14 128 lb (58.06 kg)  03/06/14 128 lb (58.06 kg)     Well-developed and well-nourished elderly female in no obvious distress. Head and neck exam shows no ocular or oral lesions. There is no palpable cervical or supraclavicular lymph nodes. Lungs are clear. Cardiac exam regular rate and rhythm with no murmurs,  rubs or bruits. Abdomen is soft. She has good bowel sounds. There is no fluid wave. There is no palpable liver or spleen tip. There is no obvious abdominal mass. There is no guarding or rebound tenderness. Extremities shows no clubbing, cyanosis or edema. Neurological exam shows no focal neurological deficits. Skin exam shows no rashes, ecchymoses or petechia.  Lab Results  Component Value Date   WBC 5.6 05/10/2014   HGB 11.4* 05/10/2014   HCT 33.5* 05/10/2014   MCV 102* 05/10/2014   PLT 131* 05/10/2014     Chemistry      Component Value Date/Time   NA 140 05/10/2014 1312   NA 138 04/17/2014 1558   K 4.3 05/10/2014 1312   K 4.1 04/17/2014 1558   CL 104 05/10/2014 1312   CL 112 04/17/2014 1558   CO2 27 05/10/2014 1312   CO2 16* 04/17/2014 1558   BUN 25* 05/10/2014 1312   BUN 37* 04/17/2014 1558   CREATININE 1.1 05/10/2014 1312   CREATININE 1.19* 04/17/2014 1558      Component Value Date/Time   CALCIUM 9.8 05/10/2014 1312   CALCIUM 9.3 04/17/2014 1558   ALKPHOS 94* 05/10/2014 1312   ALKPHOS 79 04/17/2014 0525   AST 29 05/10/2014 1312   AST 30 04/17/2014 0525   ALT 28 05/10/2014 1312   ALT 32 04/17/2014 0525   BILITOT 0.60 05/10/2014 1312   BILITOT 0.7 04/17/2014 0525         Impression and Plan: Tamara [name] is 81 year old white female with a history of stage IIIc colon cancer. She had this resected back in October 2014. She had a very, very complicated and prolonged recovery and was and never any good condition to take adjuvant chemotherapy.  Again, even though we do not have a tissue diagnosis, the elevated CEA along with the positive PET scan I think is very convincing for recurrence.   I may consider a colonoscopy on Tamara [name]. She does have a ostomy pouch. I don't know if the gastroenterologist  can  go through this or not.  I think we have to consider Tamara [name] for systemic therapy. I talked to Tamara [name] for about 45 minutes. She had Tamara [name] husband with Tamara [name].  I think that Xeloda  with oxaliplatin would be reasonable. I would have to do some dosage reduction because of Tamara [name] age. Tamara [name] graphite will want to avoid Avastin given the meds problems that she had with Tamara [name] surgery and my concern that if she were to have any kind of gastrointestinal complication from Avastin, that she would not get through this.  I told Tamara [name] that what she is  dealing with is not curable but is treatable.  I do not see any indication for surgery at this point.  We will need a Port-A-Cath to be placed. I will get this set up.  We will likely start treatment in a couple  weeks.  I will probably will give Tamara [name] 3 cycles of XELOX and then rescan Tamara [name] with the PET scan. I would like to think that the CEA level will be a good indicator of response.   Volanda Napoleon, MD 4/15/20165:10 PM

## 2014-05-13 ENCOUNTER — Other Ambulatory Visit: Payer: Self-pay | Admitting: *Deleted

## 2014-05-13 ENCOUNTER — Telehealth: Payer: Self-pay | Admitting: Hematology & Oncology

## 2014-05-13 DIAGNOSIS — C189 Malignant neoplasm of colon, unspecified: Secondary | ICD-10-CM

## 2014-05-13 MED ORDER — CAPECITABINE 500 MG PO TABS: 1000.0000 mg/m2 | ORAL_TABLET | Freq: Two times a day (BID) | ORAL | Status: DC

## 2014-05-13 MED ORDER — LIDOCAINE-PRILOCAINE 2.5-2.5 % EX CREA: 1.0000 "application " | TOPICAL_CREAM | CUTANEOUS | Status: AC | PRN

## 2014-05-13 NOTE — Telephone Encounter (Signed)
Pt aware of 4-21 port placement, to be NPO after midnight and she needs a driver to take her home. Roselyn Reef RN is aware to send prescription to [facility] on [address].

## 2014-05-13 NOTE — Telephone Encounter (Signed)
Left pt message to call for appointment add on and 4-20 appointment

## 2014-05-15 ENCOUNTER — Other Ambulatory Visit: Payer: Self-pay | Admitting: Radiology

## 2014-05-15 ENCOUNTER — Encounter: Payer: Self-pay | Admitting: *Deleted

## 2014-05-15 ENCOUNTER — Other Ambulatory Visit: Payer: Medicare Other

## 2014-05-16 ENCOUNTER — Encounter (HOSPITAL_COMMUNITY): Payer: Self-pay

## 2014-05-16 ENCOUNTER — Other Ambulatory Visit: Payer: Self-pay | Admitting: Hematology & Oncology

## 2014-05-16 ENCOUNTER — Ambulatory Visit (HOSPITAL_COMMUNITY)
Admission: RE | Admit: 2014-05-16 | Discharge: 2014-05-16 | Disposition: A | Discharge status: Home / Self Care | Payer: Medicare Other | Source: Ambulatory Visit | Attending: Interventional Radiology | Admitting: Interventional Radiology

## 2014-05-16 ENCOUNTER — Ambulatory Visit (HOSPITAL_COMMUNITY)
Admission: RE | Admit: 2014-05-16 | Discharge: 2014-05-16 | Disposition: A | Discharge status: Home / Self Care | Payer: Medicare Other | Source: Ambulatory Visit | Attending: Hematology & Oncology | Admitting: Hematology & Oncology

## 2014-05-16 DIAGNOSIS — Z79899 Other long term (current) drug therapy: Secondary | ICD-10-CM | POA: Diagnosis not present

## 2014-05-16 DIAGNOSIS — C189 Malignant neoplasm of colon, unspecified: Secondary | ICD-10-CM | POA: Diagnosis not present

## 2014-05-16 DIAGNOSIS — Z9049 Acquired absence of other specified parts of digestive tract: Secondary | ICD-10-CM | POA: Diagnosis not present

## 2014-05-16 DIAGNOSIS — Z950 Presence of cardiac pacemaker: Secondary | ICD-10-CM | POA: Diagnosis not present

## 2014-05-16 DIAGNOSIS — Z452 Encounter for adjustment and management of vascular access device: Secondary | ICD-10-CM | POA: Diagnosis not present

## 2014-05-16 DIAGNOSIS — I10 Essential (primary) hypertension: Secondary | ICD-10-CM | POA: Insufficient documentation

## 2014-05-16 DIAGNOSIS — K529 Noninfective gastroenteritis and colitis, unspecified: Secondary | ICD-10-CM | POA: Insufficient documentation

## 2014-05-16 LAB — CBC
HEMATOCRIT: 37 % (ref 36.0–46.0)
Hemoglobin: 12.4 g/dL (ref 12.0–15.0)
MCH: 34.3 pg — AB (ref 26.0–34.0)
MCHC: 33.5 g/dL (ref 30.0–36.0)
MCV: 102.2 fL — ABNORMAL HIGH (ref 78.0–100.0)
PLATELETS: 197 10*3/uL (ref 150–400)
RBC: 3.62 MIL/uL — ABNORMAL LOW (ref 3.87–5.11)
RDW: 12.7 % (ref 11.5–15.5)
WBC: 4.7 10*3/uL (ref 4.0–10.5)

## 2014-05-16 LAB — BASIC METABOLIC PANEL
Anion gap: 5 (ref 5–15)
BUN: 24 mg/dL — AB (ref 6–23)
CALCIUM: 10.1 mg/dL (ref 8.4–10.5)
CO2: 25 mmol/L (ref 19–32)
Chloride: 110 mmol/L (ref 96–112)
Creatinine, Ser: 1.04 mg/dL (ref 0.50–1.10)
GFR calc Af Amer: 56 mL/min — ABNORMAL LOW (ref >90)
GFR, EST NON AFRICAN AMERICAN: 49 mL/min — AB (ref >90)
Glucose, Bld: 97 mg/dL (ref 70–99)
Potassium: 5.8 mmol/L — ABNORMAL HIGH (ref 3.5–5.1)
Sodium: 140 mmol/L (ref 135–145)

## 2014-05-16 LAB — APTT: aPTT: 29 seconds (ref 24–37)

## 2014-05-16 LAB — PROTIME-INR
INR: 1 (ref 0.00–1.49)
Prothrombin Time: 13.3 seconds (ref 11.6–15.2)

## 2014-05-16 MED ORDER — FENTANYL CITRATE (PF) 100 MCG/2ML IJ SOLN
INTRAMUSCULAR | Status: AC
  Filled 2014-05-16: qty 6

## 2014-05-16 MED ORDER — LIDOCAINE HCL 1 % IJ SOLN
INTRAMUSCULAR | Status: AC
  Filled 2014-05-16: qty 20

## 2014-05-16 MED ORDER — MIDAZOLAM HCL 2 MG/2ML IJ SOLN
INTRAMUSCULAR | Status: AC | PRN
  Administered 2014-05-16 (×3): 0.5 mg via INTRAVENOUS

## 2014-05-16 MED ORDER — MIDAZOLAM HCL 2 MG/2ML IJ SOLN
INTRAMUSCULAR | Status: AC
  Filled 2014-05-16: qty 6

## 2014-05-16 MED ORDER — HEPARIN SOD (PORK) LOCK FLUSH 100 UNIT/ML IV SOLN
INTRAVENOUS | Status: AC
  Administered 2014-05-16: 12:00:00
  Filled 2014-05-16: qty 5

## 2014-05-16 MED ORDER — LIDOCAINE-EPINEPHRINE 2 %-1:100000 IJ SOLN
INTRAMUSCULAR | Status: AC
  Filled 2014-05-16: qty 1

## 2014-05-16 MED ORDER — FENTANYL CITRATE (PF) 100 MCG/2ML IJ SOLN
INTRAMUSCULAR | Status: AC | PRN
  Administered 2014-05-16 (×3): 25 ug via INTRAVENOUS

## 2014-05-16 MED ORDER — CEFAZOLIN SODIUM-DEXTROSE 2-3 GM-% IV SOLR
INTRAVENOUS | Status: AC
  Filled 2014-05-16: qty 50

## 2014-05-16 MED ORDER — CEFAZOLIN SODIUM-DEXTROSE 2-3 GM-% IV SOLR
2.0000 g | Freq: Once | INTRAVENOUS | Status: AC
  Administered 2014-05-16: 2 g via INTRAVENOUS

## 2014-05-16 MED ORDER — SODIUM CHLORIDE 0.9 % IV SOLN
Freq: Once | INTRAVENOUS | Status: AC
  Administered 2014-05-16: 10:00:00 via INTRAVENOUS

## 2014-05-16 NOTE — Procedures (Signed)
R IJ Port cathter placement with US and fluoroscopy No complication No blood loss. See complete dictation in [facility] PACS.  

## 2014-05-16 NOTE — H&P (Signed)
Chief Complaint: "I'm here to get a port a cath"  Referring Physician(s): Ennever,Peter R  History of Present Illness: Tamara [name] is a 81 y.o. female with history of recurrent colon cancer who presents today for Port-A-Cath placement for chemotherapy.   Past Medical History  Diagnosis Date  . HTN (hypertension) August 2014  . Syncope and collapse August 2014    sinus pauses/ PTVDP  . Colitis   . Breast cancer   . Colon cancer 02/21/2013    Past Surgical History  Procedure Laterality Date  . Breast lumpectomy    . Cholecystectomy    . Tonsillectomy    . Pacemaker insertion  08/28/12    MDT  . Flexible sigmoidoscopy N/A 12/06/2012    Procedure: FLEXIBLE SIGMOIDOSCOPY;  Surgeon: Lear Ng, MD;  Location: [place];  Service: Endoscopy;  Laterality: N/A;  . Laparoscopic partial colectomy N/A 12/07/2012    Procedure: LAPAROSCOPIC-ASSISTED TRANSVERSE COLECTOMY;  Surgeon: Joyice Faster. Cornett, MD;  Location: [place];  Service: General;  Laterality: N/A;  . Colonoscopy N/A 12/15/2012    Procedure: COLONOSCOPY;  Surgeon: Jeryl Columbia, MD;  Location: [facility] ENDOSCOPY;  Service: Endoscopy;  Laterality: N/A;  . Bowel decompression N/A 12/15/2012    Procedure: BOWEL DECOMPRESSION;  Surgeon: Jeryl Columbia, MD;  Location: [facility] ENDOSCOPY;  Service: Endoscopy;  Laterality: N/A;  . Colon resection N/A 12/22/2012    Procedure: EXPLORATORY LAPAROTOMY , RIGHT Colectomy IILEOSTOMY;  Surgeon: Rolm Bookbinder, MD;  Location: [place];  Service: General;  Laterality: N/A;  . Left heart catheterization with coronary angiogram Bilateral 08/26/2012    Procedure: LEFT HEART CATHETERIZATION WITH CORONARY ANGIOGRAM;  Surgeon: Lorretta Harp, MD;  Location: [facility] CATH LAB;  Service: Cardiovascular;  Laterality: Bilateral;  . Permanent pacemaker insertion N/A 08/28/2012    Procedure: PERMANENT PACEMAKER INSERTION;  Surgeon: Sanda Klein, MD;  Location: [place] CATH LAB;  Service: Cardiovascular;  Laterality:  N/A;    Allergies: Codeine; Zofran; Ciprofloxacin; Metronidazole; Skelaxin; Tetracycline hcl; and Avelox  Medications: Prior to Admission medications   Medication Sig Start Date End Date Taking? Authorizing Provider  acetaminophen (TYLENOL) 500 MG tablet Take 500 mg by mouth every 6 (six) hours as needed for mild pain, moderate pain, fever or headache.    Historical Provider, MD  b complex vitamins capsule Take 1 capsule by mouth every other day. Alternating days with Super B complex    Historical Provider, MD  B Complex-C (SUPER B COMPLEX PO) Take 1 tablet by mouth every other day. Alternating days with B complex    Historical Provider, MD  Biotin 5 MG TABS Take by mouth every morning.    Historical Provider, MD  Calcium-Magnesium-Vitamin D (CALCIUM MAGNESIUM PO) Take 1 tablet by mouth daily.    Historical Provider, MD  capecitabine (XELODA) 500 MG tablet Take 3 tablets (1,500 mg total) by mouth 2 (two) times daily after a meal. Take on days 1-14 of chemotherapy. 05/13/14   Volanda Napoleon, MD  Cholecalciferol (VITAMIN D) 2000 UNITS tablet Take 2,000 Units by mouth daily with breakfast.    Historical Provider, MD  cyclobenzaprine (FLEXERIL) 5 MG tablet Take 1 tablet (5 mg total) by mouth 3 (three) times daily as needed for muscle spasms. Patient not taking: Reported on 03/06/2014 11/02/13   Volanda Napoleon, MD  diphenoxylate-atropine (LOMOTIL) 2.5-0.025 MG per tablet Take 1 tablet by mouth 2 (two) times daily. Take one tablet by mouth once daily 01/30/13   Estill Dooms, MD  feeding supplement, ENSURE COMPLETE, (ENSURE COMPLETE) LIQD Take 237 mLs by mouth 2 (two) times daily between meals. [name]* 01/01/13   Josetta Huddle, MD  lidocaine-prilocaine (EMLA) cream Apply 1 application topically as needed. Apply to port site one hour before access 05/13/14   Volanda Napoleon, MD  Multiple Vitamin (MULTIVITAMIN WITH MINERALS) TABS tablet Take 1 tablet by mouth daily.    Historical Provider, MD  Omega 3  1000 MG CAPS Take 1 capsule by mouth daily with breakfast.     Historical Provider, MD  Probiotic Product (ALIGN PO) Take 1 capsule by mouth daily with breakfast.     Historical Provider, MD  saccharomyces boulardii (FLORASTOR) 250 MG capsule Take 1 capsule (250 mg total) by mouth 2 (two) times daily. 04/18/14   Willits, MD  UNABLE TO FIND Take by mouth every morning. SUPER ANTIOXIDENT FORMULA    Historical Provider, MD    Family History  Problem Relation Age of Onset  . Alzheimer's disease Mother   . Renal Disease Father   . Coronary artery disease Brother     History   Social History  . Marital Status: Married    Spouse Name: N/A  . Number of Children: N/A  . Years of Education: N/A   Social History Main Topics  . Smoking status: Never Smoker   . Smokeless tobacco: Never Used     Comment: never used tobacco  . Alcohol Use: No  . Drug Use: No  . Sexual Activity: Not on file   Other Topics Concern  . None   Social History Narrative      Review of Systems  Constitutional: Negative for fever and chills.  Respiratory: Negative for cough and shortness of breath.   Cardiovascular: Negative for chest pain.  Gastrointestinal: Negative for nausea, vomiting, abdominal pain and blood in stool.  Genitourinary: Negative for dysuria and hematuria.  Musculoskeletal: Negative for back pain.  Neurological: Negative for headaches.    Vital Signs: BP 168/79 mmHg  Pulse 85  Temp(Src) 97.7 F (36.5 C) (Oral)  Resp 18  Ht 5\' 6"  (1.676 m)  Wt 127 lb (57.607 kg)  BMI 20.51 kg/m2  SpO2 100%  Physical Exam  Constitutional: She is oriented to person, place, and time. She appears well-developed and well-nourished.  Cardiovascular: Normal rate.   Left chest wall pacer  Pulmonary/Chest: Effort normal and breath sounds normal.  Abdominal: Soft. Bowel sounds are normal.  RLQ ostomy intact  Musculoskeletal: Normal range of motion.  Neurological: She is alert and oriented to  person, place, and time.    Imaging: Nm Pet Image Restag (ps) Skull Base To Thigh  05/09/2014   CLINICAL DATA:  Subsequent treatment strategy for colon cancer with rising CEA. Also history of breast cancer.  EXAM: NUCLEAR MEDICINE PET SKULL BASE TO THIGH  TECHNIQUE: 6.1 mCi F-18 FDG was injected intravenously. Full-ring PET imaging was performed from the skull base to thigh after the radiotracer. CT data was obtained and used for attenuation correction and anatomic localization.  FASTING BLOOD GLUCOSE:  Value: 91 mg/dl  COMPARISON:  03/12/2014 and various other CT exams.  FINDINGS: NECK  No hypermetabolic lymph nodes in the neck.  CHEST  No hypermetabolic mediastinal or hilar nodes. No suspicious pulmonary nodules on the CT scan. Anterior right upper lobe and right middle lobe scarring likely related to prior radiation therapy. Stable faint nodularity in the left upper lobe is thought to be benign.  ABDOMEN/PELVIS  Abnormal soft tissue nodularity measuring  approximately 2.8 by 2.1 cm along the right anterior uterine fundal margin, poorly separable from the adjacent urinary bladder and adjacent small bowel loops, maximum standard uptake value 5.9.  There is high activity at the rectosigmoid junction in general which may be physiologic, although tumor deposits along the margin of the bowel are not excluded, maximum standard uptake value in this vicinity 10.6.  Right ostomy noted.  Sigmoid colon diverticulosis.  SKELETON  No focal hypermetabolic activity to suggest skeletal metastasis.  IMPRESSION: 1. Suspected tumor deposit along the right anterior uterine fundus region, poorly separable from the adjacent urinary bladder and bowel, maximum standard uptake value 5.9. 2. There is some high activity along the rectosigmoid junction, potentially physiologic although tumor deposits along the bowel margin cannot be excluded. 3. Sigmoid colon diverticulosis.   Electronically Signed   By: Van Clines M.D.   On:  05/09/2014 10:59    Labs:  CBC:  Recent Labs  04/15/14 1325 04/16/14 0455 04/17/14 0525 05/10/14 1311  WBC 7.5 5.4 5.6 5.6  HGB 13.1 13.5 12.3 11.4*  HCT 39.1 40.5 37.4 33.5*  PLT 120* 121* 105* 131*    COAGS:  Recent Labs  04/16/14 0455 04/17/14 0525  INR 1.11 1.06    BMP:  Recent Labs  04/15/14 1325 04/16/14 0455 04/17/14 0525 04/17/14 1558 05/10/14 1312  NA 137 133* 138 138 140  K 4.6 4.2 4.1 4.1 4.3  CL 107 105 112 112 104  CO2 20 17* 17* 16* 27  GLUCOSE 132* 125* 92 104* 98  BUN 29* 34* 43* 37* 25*  CALCIUM 9.8 9.3 9.0 9.3 9.8  CREATININE 1.04 1.25* 1.50* 1.19* 1.1  GFRNONAA 49* 39* 31* 41*  --   GFRAA 56* 45* 36* 48*  --     LIVER FUNCTION TESTS:  Recent Labs  03/06/14 1005 04/15/14 1325 04/16/14 0455 04/17/14 0525 05/10/14 1312  BILITOT 0.6 0.7 0.7 0.7 0.60  AST 24 35 34 30 29  ALT 18 34 34 32 28  ALKPHOS 88 105 98 79 94*  PROT 7.1 8.0 7.9 6.7 7.7  ALBUMIN 4.1 4.4 4.5 3.8  --     TUMOR MARKERS:  Recent Labs  11/02/13 1109 03/06/14 1005 04/17/14 0846 05/10/14 1311  CEA 7.2* 24.4* 69.2* 47.6*    Assessment and Plan: Tamara [name] is a 81 y.o. female with history of recurrent colon cancer who presents today for Port-A-Cath placement for chemotherapy.Risks and benefits discussed with the patient/husband including, but not limited to bleeding, infection, pneumothorax, or fibrin sheath development and need for additional procedures. All of the patient's questions were answered, patient is agreeable to proceed. Consent signed and in chart.       Signed: Autumn Messing 05/16/2014, 10:04 AM   I spent a total of 20 minutes face to face in clinical consultation, greater than 50% of which was counseling/coordinating care for Port-A-Cath placement

## 2014-05-16 NOTE — Discharge Instructions (Signed)

## 2014-05-22 ENCOUNTER — Other Ambulatory Visit: Payer: Self-pay | Admitting: Oncology

## 2014-05-22 DIAGNOSIS — N179 Acute kidney failure, unspecified: Secondary | ICD-10-CM

## 2014-05-22 DIAGNOSIS — C189 Malignant neoplasm of colon, unspecified: Secondary | ICD-10-CM

## 2014-05-22 MED ORDER — PROCHLORPERAZINE MALEATE 10 MG PO TABS: 10.0000 mg | ORAL_TABLET | Freq: Four times a day (QID) | ORAL | Status: DC | PRN

## 2014-05-22 MED ORDER — DEXAMETHASONE 4 MG PO TABS: 8.0000 mg | ORAL_TABLET | Freq: Two times a day (BID) | ORAL | Status: DC

## 2014-05-22 MED ORDER — ONDANSETRON HCL 8 MG PO TABS: 8.0000 mg | ORAL_TABLET | Freq: Two times a day (BID) | ORAL | Status: DC

## 2014-05-23 ENCOUNTER — Other Ambulatory Visit (HOSPITAL_BASED_OUTPATIENT_CLINIC_OR_DEPARTMENT_OTHER): Payer: Medicare Other

## 2014-05-23 ENCOUNTER — Encounter: Payer: Self-pay | Admitting: Hematology & Oncology

## 2014-05-23 ENCOUNTER — Ambulatory Visit (HOSPITAL_BASED_OUTPATIENT_CLINIC_OR_DEPARTMENT_OTHER): Payer: Medicare Other

## 2014-05-23 VITALS — BP 177/79 | HR 90 | Temp 98.5°F | Resp 20 | Wt 122.0 lb

## 2014-05-23 DIAGNOSIS — C184 Malignant neoplasm of transverse colon: Secondary | ICD-10-CM | POA: Diagnosis not present

## 2014-05-23 DIAGNOSIS — C189 Malignant neoplasm of colon, unspecified: Secondary | ICD-10-CM

## 2014-05-23 DIAGNOSIS — K5900 Constipation, unspecified: Secondary | ICD-10-CM | POA: Insufficient documentation

## 2014-05-23 DIAGNOSIS — Z5111 Encounter for antineoplastic chemotherapy: Secondary | ICD-10-CM

## 2014-05-23 DIAGNOSIS — D500 Iron deficiency anemia secondary to blood loss (chronic): Secondary | ICD-10-CM | POA: Insufficient documentation

## 2014-05-23 LAB — CBC WITH DIFFERENTIAL (CANCER CENTER ONLY)
BASO#: 0 10*3/uL (ref 0.0–0.2)
BASO%: 0.2 % (ref 0.0–2.0)
EOS%: 0.9 % (ref 0.0–7.0)
Eosinophils Absolute: 0.1 10*3/uL (ref 0.0–0.5)
HCT: 33.4 % — ABNORMAL LOW (ref 34.8–46.6)
HGB: 11.4 g/dL — ABNORMAL LOW (ref 11.6–15.9)
LYMPH#: 1.7 10*3/uL (ref 0.9–3.3)
LYMPH%: 20.7 % (ref 14.0–48.0)
MCH: 34.8 pg — AB (ref 26.0–34.0)
MCHC: 34.1 g/dL (ref 32.0–36.0)
MCV: 102 fL — AB (ref 81–101)
MONO#: 0.7 10*3/uL (ref 0.1–0.9)
MONO%: 8.8 % (ref 0.0–13.0)
NEUT#: 5.6 10*3/uL (ref 1.5–6.5)
NEUT%: 69.4 % (ref 39.6–80.0)
PLATELETS: 127 10*3/uL — AB (ref 145–400)
RBC: 3.28 10*6/uL — ABNORMAL LOW (ref 3.70–5.32)
RDW: 12.1 % (ref 11.1–15.7)
WBC: 8.1 10*3/uL (ref 3.9–10.0)

## 2014-05-23 LAB — CMP (CANCER CENTER ONLY)
ALK PHOS: 97 U/L — AB (ref 26–84)
ALT(SGPT): 23 U/L (ref 10–47)
AST: 33 U/L (ref 11–38)
Albumin: 4.1 g/dL (ref 3.3–5.5)
BILIRUBIN TOTAL: 0.8 mg/dL (ref 0.20–1.60)
BUN, Bld: 28 mg/dL — ABNORMAL HIGH (ref 7–22)
CO2: 27 mEq/L (ref 18–33)
CREATININE: 1.1 mg/dL (ref 0.6–1.2)
Calcium: 10.1 mg/dL (ref 8.0–10.3)
Chloride: 104 mEq/L (ref 98–108)
Glucose, Bld: 101 mg/dL (ref 73–118)
Potassium: 4.3 mEq/L (ref 3.3–4.7)
SODIUM: 141 meq/L (ref 128–145)
TOTAL PROTEIN: 7.7 g/dL (ref 6.4–8.1)

## 2014-05-23 MED ORDER — OXALIPLATIN CHEMO INJECTION 100 MG/20ML
104.0000 mg/m2 | Freq: Once | INTRAVENOUS | Status: AC
  Administered 2014-05-23: 170 mg via INTRAVENOUS
  Filled 2014-05-23: qty 34

## 2014-05-23 MED ORDER — HEPARIN SOD (PORK) LOCK FLUSH 100 UNIT/ML IV SOLN
500.0000 [IU] | Freq: Once | INTRAVENOUS | Status: AC | PRN
  Administered 2014-05-23: 500 [IU]
  Filled 2014-05-23: qty 5

## 2014-05-23 MED ORDER — PALONOSETRON HCL INJECTION 0.25 MG/5ML
INTRAVENOUS | Status: AC
  Filled 2014-05-23: qty 5

## 2014-05-23 MED ORDER — SODIUM CHLORIDE 0.9 % IJ SOLN
10.0000 mL | INTRAMUSCULAR | Status: DC | PRN
  Administered 2014-05-23: 10 mL
  Filled 2014-05-23: qty 10

## 2014-05-23 MED ORDER — DEXTROSE 5 % IV SOLN
Freq: Once | INTRAVENOUS | Status: AC
  Administered 2014-05-23: 10:00:00 via INTRAVENOUS

## 2014-05-23 MED ORDER — SODIUM CHLORIDE 0.9 % IV SOLN
10.0000 mg | Freq: Once | INTRAVENOUS | Status: AC
  Administered 2014-05-23: 10 mg via INTRAVENOUS
  Filled 2014-05-23: qty 1

## 2014-05-23 MED ORDER — PALONOSETRON HCL INJECTION 0.25 MG/5ML
0.2500 mg | Freq: Once | INTRAVENOUS | Status: AC
  Administered 2014-05-23: 0.25 mg via INTRAVENOUS

## 2014-05-23 MED ORDER — LORAZEPAM 0.5 MG PO TABS: 0.5000 mg | ORAL_TABLET | Freq: Four times a day (QID) | ORAL | Status: DC | PRN

## 2014-05-23 NOTE — Patient Instructions (Signed)
[facility] Discharge Instructions for Patients Receiving Chemotherapy  Today you received the following chemotherapy agents:  Oxaliplatin  To help prevent nausea and vomiting after your treatment, we encourage you to take your nausea medications as directed on the bottle.  They have been called in downstairs at our [facility].     If you develop nausea and vomiting that is not controlled by your nausea medication, call the clinic.   BELOW ARE SYMPTOMS THAT SHOULD BE REPORTED IMMEDIATELY:  *FEVER GREATER THAN 100.5 F  *CHILLS WITH OR WITHOUT FEVER  NAUSEA AND VOMITING THAT IS NOT CONTROLLED WITH YOUR NAUSEA MEDICATION  *UNUSUAL SHORTNESS OF BREATH  *UNUSUAL BRUISING OR BLEEDING  TENDERNESS IN MOUTH AND THROAT WITH OR WITHOUT PRESENCE OF ULCERS  *URINARY PROBLEMS  *BOWEL PROBLEMS  UNUSUAL RASH Items with * indicate a potential emergency and should be followed up as soon as possible.  Feel free to call the clinic you have any questions or concerns. The clinic phone number is [phone].  Oxaliplatin Injection What is this medicine? OXALIPLATIN (ox AL i PLA tin) is a chemotherapy drug. It targets fast dividing cells, like cancer cells, and causes these cells to die. This medicine is used to treat cancers of the colon and rectum, and many other cancers. This medicine may be used for other purposes; ask your health care provider or pharmacist if you have questions. COMMON BRAND NAME(S): Eloxatin What should I tell my health care provider before I take this medicine? They need to know if you have any of these conditions: -kidney disease -an unusual or allergic reaction to oxaliplatin, other chemotherapy, other medicines, foods, dyes, or preservatives -pregnant or trying to get pregnant -breast-feeding How should I use this medicine? This drug is given as an infusion into a vein. It is administered in a hospital or clinic by a specially trained health  care professional. Talk to your pediatrician regarding the use of this medicine in children. Special care may be needed. Overdosage: If you think you have taken too much of this medicine contact a poison control center or emergency room at once. NOTE: This medicine is only for you. Do not share this medicine with others. What if I miss a dose? It is important not to miss a dose. Call your doctor or health care professional if you are unable to keep an appointment. What may interact with this medicine? -medicines to increase blood counts like filgrastim, pegfilgrastim, sargramostim -probenecid -some antibiotics like amikacin, gentamicin, neomycin, polymyxin B, streptomycin, tobramycin -zalcitabine Talk to your doctor or health care professional before taking any of these medicines: -acetaminophen -aspirin -ibuprofen -ketoprofen -naproxen This list may not describe all possible interactions. Give your health care provider a list of all the medicines, herbs, non-prescription drugs, or dietary supplements you use. Also tell them if you smoke, drink alcohol, or use illegal drugs. Some items may interact with your medicine. What should I watch for while using this medicine? Your condition will be monitored carefully while you are receiving this medicine. You will need important blood work done while you are taking this medicine. This medicine can make you more sensitive to cold. Do not drink cold drinks or use ice. Cover exposed skin before coming in contact with cold temperatures or cold objects. When out in cold weather wear warm clothing and cover your mouth and nose to warm the air that goes into your lungs. Tell your doctor if you get sensitive to the cold. This drug  may make you feel generally unwell. This is not uncommon, as chemotherapy can affect healthy cells as well as cancer cells. Report any side effects. Continue your course of treatment even though you feel ill unless your doctor tells  you to stop. In some cases, you may be given additional medicines to help with side effects. Follow all directions for their use. Call your doctor or health care professional for advice if you get a fever, chills or sore throat, or other symptoms of a cold or flu. Do not treat yourself. This drug decreases your body's ability to fight infections. Try to avoid being around people who are sick. This medicine may increase your risk to bruise or bleed. Call your doctor or health care professional if you notice any unusual bleeding. Be careful brushing and flossing your teeth or using a toothpick because you may get an infection or bleed more easily. If you have any dental work done, tell your dentist you are receiving this medicine. Avoid taking products that contain aspirin, acetaminophen, ibuprofen, naproxen, or ketoprofen unless instructed by your doctor. These medicines may hide a fever. Do not become pregnant while taking this medicine. Women should inform their doctor if they wish to become pregnant or think they might be pregnant. There is a potential for serious side effects to an unborn child. Talk to your health care professional or pharmacist for more information. Do not breast-feed an infant while taking this medicine. Call your doctor or health care professional if you get diarrhea. Do not treat yourself. What side effects may I notice from receiving this medicine? Side effects that you should report to your doctor or health care professional as soon as possible: -allergic reactions like skin rash, itching or hives, swelling of the face, lips, or tongue -low blood counts - This drug may decrease the number of white blood cells, red blood cells and platelets. You may be at increased risk for infections and bleeding. -signs of infection - fever or chills, cough, sore throat, pain or difficulty passing urine -signs of decreased platelets or bleeding - bruising, pinpoint red spots on the skin,  black, tarry stools, nosebleeds -signs of decreased red blood cells - unusually weak or tired, fainting spells, lightheadedness -breathing problems -chest pain, pressure -cough -diarrhea -jaw tightness -mouth sores -nausea and vomiting -pain, swelling, redness or irritation at the injection site -pain, tingling, numbness in the hands or feet -problems with balance, talking, walking -redness, blistering, peeling or loosening of the skin, including inside the mouth -trouble passing urine or change in the amount of urine Side effects that usually do not require medical attention (report to your doctor or health care professional if they continue or are bothersome): -changes in vision -constipation -hair loss -loss of appetite -metallic taste in the mouth or changes in taste -stomach pain This list may not describe all possible side effects. Call your doctor for medical advice about side effects. You may report side effects to FDA at 1-800-FDA-1088. Where should I keep my medicine? This drug is given in a hospital or clinic and will not be stored at home. NOTE: This sheet is a summary. It may not cover all possible information. If you have questions about this medicine, talk to your doctor, pharmacist, or health care provider.  2015, [employer]. (2007-08-08 17:22:47)

## 2014-05-29 ENCOUNTER — Telehealth: Payer: Self-pay | Admitting: *Deleted

## 2014-05-29 NOTE — Telephone Encounter (Signed)
Patient has exhausted all searches for financial help with the po Xeloda. She has decided to pay the $2700 copay out of pocket. She picked up this prescription yesterday and needed clarification on when to start. Spoke with Dr Marin Olp who wants patient to start the Xeloda when she begins her next round of systemic chemotherapy on May 20th. Communicated this with patient and she is in understanding and agreement.

## 2014-06-06 ENCOUNTER — Other Ambulatory Visit: Payer: Self-pay | Admitting: Nurse Practitioner

## 2014-06-13 ENCOUNTER — Other Ambulatory Visit: Payer: Self-pay | Admitting: *Deleted

## 2014-06-13 DIAGNOSIS — C189 Malignant neoplasm of colon, unspecified: Secondary | ICD-10-CM

## 2014-06-14 ENCOUNTER — Ambulatory Visit (HOSPITAL_BASED_OUTPATIENT_CLINIC_OR_DEPARTMENT_OTHER): Payer: Medicare Other | Admitting: Hematology & Oncology

## 2014-06-14 ENCOUNTER — Other Ambulatory Visit (HOSPITAL_BASED_OUTPATIENT_CLINIC_OR_DEPARTMENT_OTHER): Payer: Medicare Other

## 2014-06-14 ENCOUNTER — Ambulatory Visit (HOSPITAL_BASED_OUTPATIENT_CLINIC_OR_DEPARTMENT_OTHER): Payer: Medicare Other

## 2014-06-14 VITALS — BP 143/58 | HR 66 | Temp 98.1°F | Resp 18

## 2014-06-14 DIAGNOSIS — C189 Malignant neoplasm of colon, unspecified: Secondary | ICD-10-CM | POA: Diagnosis not present

## 2014-06-14 DIAGNOSIS — R970 Elevated carcinoembryonic antigen [CEA]: Secondary | ICD-10-CM

## 2014-06-14 DIAGNOSIS — Z5111 Encounter for antineoplastic chemotherapy: Secondary | ICD-10-CM | POA: Diagnosis not present

## 2014-06-14 DIAGNOSIS — Z933 Colostomy status: Secondary | ICD-10-CM

## 2014-06-14 LAB — CMP (CANCER CENTER ONLY)
ALK PHOS: 87 U/L — AB (ref 26–84)
ALT(SGPT): 27 U/L (ref 10–47)
AST: 37 U/L (ref 11–38)
Albumin: 3.8 g/dL (ref 3.3–5.5)
BILIRUBIN TOTAL: 0.8 mg/dL (ref 0.20–1.60)
BUN, Bld: 23 mg/dL — ABNORMAL HIGH (ref 7–22)
CO2: 26 mEq/L (ref 18–33)
Calcium: 9.9 mg/dL (ref 8.0–10.3)
Chloride: 106 mEq/L (ref 98–108)
Creat: 1.2 mg/dl (ref 0.6–1.2)
GLUCOSE: 94 mg/dL (ref 73–118)
Potassium: 4.6 mEq/L (ref 3.3–4.7)
SODIUM: 140 meq/L (ref 128–145)
Total Protein: 7.3 g/dL (ref 6.4–8.1)

## 2014-06-14 LAB — CBC WITH DIFFERENTIAL (CANCER CENTER ONLY)
BASO#: 0 10*3/uL (ref 0.0–0.2)
BASO%: 0.6 % (ref 0.0–2.0)
EOS%: 2.2 % (ref 0.0–7.0)
Eosinophils Absolute: 0.1 10*3/uL (ref 0.0–0.5)
HEMATOCRIT: 33.5 % — AB (ref 34.8–46.6)
HGB: 11.7 g/dL (ref 11.6–15.9)
LYMPH#: 1.6 10*3/uL (ref 0.9–3.3)
LYMPH%: 30 % (ref 14.0–48.0)
MCH: 35.1 pg — ABNORMAL HIGH (ref 26.0–34.0)
MCHC: 34.9 g/dL (ref 32.0–36.0)
MCV: 101 fL (ref 81–101)
MONO#: 0.7 10*3/uL (ref 0.1–0.9)
MONO%: 13.8 % — ABNORMAL HIGH (ref 0.0–13.0)
NEUT#: 2.9 10*3/uL (ref 1.5–6.5)
NEUT%: 53.4 % (ref 39.6–80.0)
Platelets: 104 10*3/uL — ABNORMAL LOW (ref 145–400)
RBC: 3.33 10*6/uL — ABNORMAL LOW (ref 3.70–5.32)
RDW: 12.4 % (ref 11.1–15.7)
WBC: 5.4 10*3/uL (ref 3.9–10.0)

## 2014-06-14 LAB — CEA: CEA: 59.3 ng/mL — AB (ref 0.0–5.0)

## 2014-06-14 MED ORDER — PALONOSETRON HCL INJECTION 0.25 MG/5ML
0.2500 mg | Freq: Once | INTRAVENOUS | Status: AC
  Administered 2014-06-14: 0.25 mg via INTRAVENOUS

## 2014-06-14 MED ORDER — DEXAMETHASONE SODIUM PHOSPHATE 100 MG/10ML IJ SOLN
10.0000 mg | Freq: Once | INTRAMUSCULAR | Status: AC
Start: 2014-06-14 — End: 2014-06-14
  Administered 2014-06-14: 10 mg via INTRAVENOUS
  Filled 2014-06-14: qty 1

## 2014-06-14 MED ORDER — PALONOSETRON HCL INJECTION 0.25 MG/5ML
INTRAVENOUS | Status: AC
  Filled 2014-06-14: qty 5

## 2014-06-14 MED ORDER — DEXAMETHASONE SODIUM PHOSPHATE 20 MG/5ML IJ SOLN
INTRAMUSCULAR | Status: AC
  Filled 2014-06-14: qty 5

## 2014-06-14 MED ORDER — HEPARIN SOD (PORK) LOCK FLUSH 100 UNIT/ML IV SOLN
500.0000 [IU] | Freq: Once | INTRAVENOUS | Status: AC | PRN
  Administered 2014-06-14: 500 [IU]
  Filled 2014-06-14: qty 5

## 2014-06-14 MED ORDER — DEXTROSE 5 % IV SOLN
104.0000 mg/m2 | Freq: Once | INTRAVENOUS | Status: AC
  Administered 2014-06-14: 170 mg via INTRAVENOUS
  Filled 2014-06-14: qty 34

## 2014-06-14 MED ORDER — SODIUM CHLORIDE 0.9 % IJ SOLN
10.0000 mL | INTRAMUSCULAR | Status: DC | PRN
  Administered 2014-06-14: 10 mL
  Filled 2014-06-14: qty 10

## 2014-06-14 MED ORDER — DEXTROSE 5 % IV SOLN
Freq: Once | INTRAVENOUS | Status: AC
  Administered 2014-06-14: 10:00:00 via INTRAVENOUS

## 2014-06-14 NOTE — Progress Notes (Signed)
Hematology and Oncology Follow Up Visit  Tamara [name] [id number] August 15, 1929 81 y.o. 06/14/2014   Principle Diagnosis:   Recurrent/metastatic colon cancer  Current Therapy:    Patient to start cycle 1 of XELOX     Interim History:  Tamara [name] is back for follow-up. Unfortunately, it has been a struggle trying to get her to start treatment. There is been insurance issues. She has had up a about $3000 for the Xeloda. Her husband has squamous or carcinoma of the scalp. He is supposed to start radiation treatments next week.  She has yet to start treatment. I think she actually started the Xeloda today.  I spent about 45 minutes with her and her husband trying to get her to understand how the protocol works.  She has a Port-A-Cath in. I talked her about using the infusional 5-FU with FOLFOX. If my becomes an issue with affording the Xeloda, then we may have to go with the infusional pump.  Otherwise, she seems to be doing okay. She's having no process with the colostomy. She's having no pain. She's having no nausea or vomiting. There's been no cough. She's had no rashes. She's had no leg swelling.  Overall, her performance status is ECOG 1-2.   Medications:  Current outpatient prescriptions:  .  acetaminophen (TYLENOL) 500 MG tablet, Take 500 mg by mouth every 6 (six) hours as needed for mild pain, moderate pain, fever or headache., Disp: , Rfl:  .  b complex vitamins capsule, Take 1 capsule by mouth every other day. Alternating days with Super B complex, Disp: , Rfl:  .  B Complex-C (SUPER B COMPLEX PO), Take 1 tablet by mouth every other day. Alternating days with B complex, Disp: , Rfl:  .  Biotin 5 MG TABS, Take 1 tablet by mouth every morning. , Disp: , Rfl:  .  Calcium-Magnesium-Vitamin D (CALCIUM MAGNESIUM PO), Take 1 tablet by mouth daily., Disp: , Rfl:  .  capecitabine (XELODA) 500 MG tablet, Take 3 tablets (1,500 mg total) by mouth 2 (two) times daily after a meal. Take on  days 1-14 of chemotherapy. (Patient not taking: Reported on 05/16/2014), Disp: 84 tablet, Rfl: 4 .  Cholecalciferol (VITAMIN D) 2000 UNITS tablet, Take 2,000 Units by mouth daily with breakfast., Disp: , Rfl:  .  cyclobenzaprine (FLEXERIL) 5 MG tablet, Take 1 tablet (5 mg total) by mouth 3 (three) times daily as needed for muscle spasms. (Patient not taking: Reported on 03/06/2014), Disp: 60 tablet, Rfl: 2 .  dexamethasone (DECADRON) 4 MG tablet, Take 2 tablets (8 mg total) by mouth 2 (two) times daily with a meal. Start the day after chemotherapy for 2 days. Take with food., Disp: 30 tablet, Rfl: 1 .  diphenoxylate-atropine (LOMOTIL) 2.5-0.025 MG per tablet, Take 1 tablet by mouth 2 (two) times daily. Take one tablet by mouth once daily, Disp: , Rfl:  .  feeding supplement, ENSURE COMPLETE, (ENSURE COMPLETE) LIQD, Take 237 mLs by mouth 2 (two) times daily between meals. *Vanilla*, Disp: , Rfl:  .  lidocaine-prilocaine (EMLA) cream, Apply 1 application topically as needed. Apply to port site one hour before access, Disp: 30 g, Rfl: 0 .  LORazepam (ATIVAN) 0.5 MG tablet, Take 1 tablet (0.5 mg total) by mouth every 6 (six) hours as needed (Nausea or vomiting)., Disp: 30 tablet, Rfl: 0 .  Multiple Vitamin (MULTIVITAMIN WITH MINERALS) TABS tablet, Take 1 tablet by mouth daily., Disp: , Rfl:  .  Omega 3 1000 MG CAPS,  Take 1 capsule by mouth daily with breakfast. , Disp: , Rfl:  .  ondansetron (ZOFRAN) 8 MG tablet, Take 1 tablet (8 mg total) by mouth 2 (two) times daily. Start the day after chemo for 2 days. Then take as needed for nausea or vomiting., Disp: 30 tablet, Rfl: 1 .  Probiotic Product (ALIGN PO), Take 1 capsule by mouth daily with breakfast. , Disp: , Rfl:  .  prochlorperazine (COMPAZINE) 10 MG tablet, Take 1 tablet (10 mg total) by mouth every 6 (six) hours as needed (Nausea or vomiting)., Disp: 30 tablet, Rfl: 1 .  saccharomyces boulardii (FLORASTOR) 250 MG capsule, Take 1 capsule (250 mg total)  by mouth 2 (two) times daily. (Patient not taking: Reported on 05/16/2014), Disp: 20 capsule, Rfl: 0 .  UNABLE TO FIND, Take 1 tablet by mouth every morning. SUPER ANTIOXIDENT FORMULA, Disp: , Rfl:   Allergies:  Allergies  Allergen Reactions  . Codeine Nausea And Vomiting  . Zofran [Ondansetron Hcl] Nausea And Vomiting  . Ciprofloxacin     Confusion/dizziness/fatigue  . Metronidazole     Confusion/dizziness/fatigue  . Skelaxin [Metaxalone]     Too strong  . Tetracycline Hcl     Not tolerated  . Avelox [Moxifloxacin] Rash    Past Medical History, Surgical history, Social history, and Family History were reviewed and updated.  Review of Systems: As above  Physical Exam:  vitals were not taken for this visit.  Wt Readings from Last 3 Encounters:  05/23/14 122 lb 0.6 oz (55.357 kg)  05/10/14 127 lb (57.607 kg)  04/15/14 128 lb (58.06 kg)     Well-developed and well-nourished elderly female in no obvious distress. Head and neck exam shows no ocular or oral lesions. There is no palpable cervical or supraclavicular lymph nodes. Lungs are clear. Cardiac exam regular rate and rhythm with no murmurs, rubs or bruits. Abdomen is soft. She has good bowel sounds. There is no fluid wave. There is no palpable liver or spleen tip. There is no obvious abdominal mass. There is no guarding or rebound tenderness. Extremities shows no clubbing, cyanosis or edema. Neurological exam shows no focal neurological deficits. Skin exam shows no rashes, ecchymoses or petechia.  Lab Results  Component Value Date   WBC 5.4 06/14/2014   HGB 11.7 06/14/2014   HCT 33.5* 06/14/2014   MCV 101 06/14/2014   PLT 104* 06/14/2014     Chemistry      Component Value Date/Time   NA 140 06/14/2014 0840   NA 140 05/16/2014 1105   NA 130* 02/25/2013 0835   K 4.6 06/14/2014 0840   K 5.8* 05/16/2014 1105   K 5.6* 02/25/2013 0835   CL 106 06/14/2014 0840   CL 110 05/16/2014 1105   CL 102 02/25/2013 0835   CO2 26  06/14/2014 0840   CO2 25 05/16/2014 1105   CO2 25 02/25/2013 0835   BUN 23* 06/14/2014 0840   BUN 24* 05/16/2014 1105   BUN 32* 02/25/2013 0835   CREATININE 1.2 06/14/2014 0840   CREATININE 1.04 05/16/2014 1105   CREATININE 1.07 02/25/2013 0835      Component Value Date/Time   CALCIUM 9.9 06/14/2014 0840   CALCIUM 10.1 05/16/2014 1105   CALCIUM 10.1 02/25/2013 0835   ALKPHOS 87* 06/14/2014 0840   ALKPHOS 79 04/17/2014 0525   AST 37 06/14/2014 0840   AST 30 04/17/2014 0525   ALT 27 06/14/2014 0840   ALT 32 04/17/2014 0525   BILITOT 0.80 06/14/2014 0840  BILITOT 0.7 04/17/2014 0525         Impression and Plan: Ms. Hodgkins is 82 year old white female with a history of stage IIIc colon cancer. She had this resected back in October 2014. She had a very, very complicated and prolonged recovery and was and never any good condition to take adjuvant chemotherapy.  Again, even though we do not have a tissue diagnosis, the elevated CEA along with the positive PET scan I think is very convincing for recurrence.   Hopefully, she will do well with the Xeloda. I again went over the schedule with her.  I think we can use the CEA as indicator for response.  The Port-A-Cath will come in very handy for [place].  I am avoiding Avastin right now because of the past surgical issues that she has had. We always can incorporate Avastin if necessary.  I will probably will give her 3 cycles of XELOX and then rescan her with the PET scan. I would like to think that the CEA level will be a good indicator of response.   Volanda Napoleon, MD 5/20/20165:28 PM

## 2014-06-14 NOTE — Patient Instructions (Signed)
[facility] Discharge Instructions for Patients Receiving Chemotherapy  Today you received the following chemotherapy agents Oxaliplatin  To help prevent nausea and vomiting after your treatment, we encourage you to take your nausea medication    If you develop nausea and vomiting that is not controlled by your nausea medication, call the clinic.   BELOW ARE SYMPTOMS THAT SHOULD BE REPORTED IMMEDIATELY:  *FEVER GREATER THAN 100.5 F  *CHILLS WITH OR WITHOUT FEVER  NAUSEA AND VOMITING THAT IS NOT CONTROLLED WITH YOUR NAUSEA MEDICATION  *UNUSUAL SHORTNESS OF BREATH  *UNUSUAL BRUISING OR BLEEDING  TENDERNESS IN MOUTH AND THROAT WITH OR WITHOUT PRESENCE OF ULCERS  *URINARY PROBLEMS  *BOWEL PROBLEMS  UNUSUAL RASH Items with * indicate a potential emergency and should be followed up as soon as possible.  Feel free to call the clinic you have any questions or concerns. The clinic phone number is [phone].  Please show the CHEMO ALERT CARD at check-in to the Emergency Department and triage nurse.   

## 2014-06-17 ENCOUNTER — Telehealth: Payer: Self-pay | Admitting: *Deleted

## 2014-06-17 NOTE — Telephone Encounter (Signed)
Patient c/o diarrhea, weakness with cramping in her calves, and dizziness. Feels it's related to the Xeloda. She took Friday doses and Saturday am, but has since stopped. Notified Dr Marin Olp who orders for patient to stop Xeloda. Patient is agreeable to plan. Will stop taking Xeloda. Diarrhea is slowing after the discontinuation of the medication, however patient told to call office back if it doesn't subside by tomorrow. She is already taking lomotil. She will call back tomorrow if she still has symptoms.

## 2014-07-03 ENCOUNTER — Other Ambulatory Visit: Payer: Medicare Other | Admitting: Lab

## 2014-07-03 ENCOUNTER — Ambulatory Visit: Payer: Medicare Other | Admitting: Hematology & Oncology

## 2014-07-05 ENCOUNTER — Ambulatory Visit (HOSPITAL_BASED_OUTPATIENT_CLINIC_OR_DEPARTMENT_OTHER): Payer: Medicare Other | Admitting: Hematology & Oncology

## 2014-07-05 ENCOUNTER — Ambulatory Visit: Payer: Medicare Other

## 2014-07-05 ENCOUNTER — Other Ambulatory Visit (HOSPITAL_BASED_OUTPATIENT_CLINIC_OR_DEPARTMENT_OTHER): Payer: Medicare Other

## 2014-07-05 ENCOUNTER — Encounter: Payer: Self-pay | Admitting: Hematology & Oncology

## 2014-07-05 VITALS — BP 149/68 | HR 69 | Temp 97.6°F | Resp 18 | Wt 126.0 lb

## 2014-07-05 DIAGNOSIS — C189 Malignant neoplasm of colon, unspecified: Secondary | ICD-10-CM

## 2014-07-05 LAB — CBC WITH DIFFERENTIAL (CANCER CENTER ONLY)
BASO#: 0 10*3/uL (ref 0.0–0.2)
BASO%: 0.5 % (ref 0.0–2.0)
EOS ABS: 0.1 10*3/uL (ref 0.0–0.5)
EOS%: 1.4 % (ref 0.0–7.0)
HCT: 32.9 % — ABNORMAL LOW (ref 34.8–46.6)
HEMOGLOBIN: 11.5 g/dL — AB (ref 11.6–15.9)
LYMPH#: 1.3 10*3/uL (ref 0.9–3.3)
LYMPH%: 32.2 % (ref 14.0–48.0)
MCH: 35.5 pg — ABNORMAL HIGH (ref 26.0–34.0)
MCHC: 35 g/dL (ref 32.0–36.0)
MCV: 102 fL — ABNORMAL HIGH (ref 81–101)
MONO#: 0.8 10*3/uL (ref 0.1–0.9)
MONO%: 18 % — ABNORMAL HIGH (ref 0.0–13.0)
NEUT#: 2 10*3/uL (ref 1.5–6.5)
NEUT%: 47.9 % (ref 39.6–80.0)
Platelets: 85 10*3/uL — ABNORMAL LOW (ref 145–400)
RBC: 3.24 10*6/uL — ABNORMAL LOW (ref 3.70–5.32)
RDW: 13.5 % (ref 11.1–15.7)
WBC: 4.2 10*3/uL (ref 3.9–10.0)

## 2014-07-05 LAB — CMP (CANCER CENTER ONLY)
ALT: 34 U/L (ref 10–47)
AST: 42 U/L — ABNORMAL HIGH (ref 11–38)
Albumin: 3.7 g/dL (ref 3.3–5.5)
Alkaline Phosphatase: 96 U/L — ABNORMAL HIGH (ref 26–84)
BUN, Bld: 19 mg/dL (ref 7–22)
CALCIUM: 9.9 mg/dL (ref 8.0–10.3)
CHLORIDE: 104 meq/L (ref 98–108)
CO2: 26 meq/L (ref 18–33)
CREATININE: 1.2 mg/dL (ref 0.6–1.2)
GLUCOSE: 94 mg/dL (ref 73–118)
POTASSIUM: 4.6 meq/L (ref 3.3–4.7)
Sodium: 141 mEq/L (ref 128–145)
TOTAL PROTEIN: 7.4 g/dL (ref 6.4–8.1)
Total Bilirubin: 0.9 mg/dl (ref 0.20–1.60)

## 2014-07-05 NOTE — Progress Notes (Signed)
Hematology and Oncology Follow Up Visit  Tamara [name] [id number] 1929-03-13 81 y.o. 07/05/2014   Principle Diagnosis:   Recurrent/metastatic colon cancer  Current Therapy:    Patient to start cycle 1 of FOLFOX     Interim History:  Ms. Eblen is back for follow-up. Unfortunately, she just cannot tolerate Xeloda. After 2 doses, she just had a lot of leg cramps. She just does not feel well. She had maybe a little bit of increase in diarrhea.  She already has a Port-A-Cath in. As such, I told her the only other option that we would have would be FOLFOX with infusional 5-FU. I think she would do okay with this. I don't think she would have a lot of issues with infusional 5-FU for 2 days.  Otherwise, she feels pretty well. She's had no cough. She's had no rashes. She's had no mouth sores.  Her last CEA level was 59.  Overall, her performance status is ECOG 1-2.   Medications:  Current outpatient prescriptions:  .  acetaminophen (TYLENOL) 500 MG tablet, Take 500 mg by mouth every 6 (six) hours as needed for mild pain, moderate pain, fever or headache., Disp: , Rfl:  .  b complex vitamins capsule, Take 1 capsule by mouth every other day. Alternating days with Super B complex, Disp: , Rfl:  .  B Complex-C (SUPER B COMPLEX PO), Take 1 tablet by mouth every other day. Alternating days with B complex, Disp: , Rfl:  .  Biotin 5 MG TABS, Take 1 tablet by mouth every morning. , Disp: , Rfl:  .  Calcium-Magnesium-Vitamin D (CALCIUM MAGNESIUM PO), Take 1 tablet by mouth daily., Disp: , Rfl:  .  capecitabine (XELODA) 500 MG tablet, Take 3 tablets (1,500 mg total) by mouth 2 (two) times daily after a meal. Take on days 1-14 of chemotherapy. (Patient not taking: Reported on 05/16/2014), Disp: 84 tablet, Rfl: 4 .  Cholecalciferol (VITAMIN D) 2000 UNITS tablet, Take 2,000 Units by mouth daily with breakfast., Disp: , Rfl:  .  cyclobenzaprine (FLEXERIL) 5 MG tablet, Take 1 tablet (5 mg total) by mouth  3 (three) times daily as needed for muscle spasms. (Patient not taking: Reported on 03/06/2014), Disp: 60 tablet, Rfl: 2 .  dexamethasone (DECADRON) 4 MG tablet, Take 2 tablets (8 mg total) by mouth 2 (two) times daily with a meal. Start the day after chemotherapy for 2 days. Take with food., Disp: 30 tablet, Rfl: 1 .  diphenoxylate-atropine (LOMOTIL) 2.5-0.025 MG per tablet, Take 1 tablet by mouth 2 (two) times daily. Take one tablet by mouth once daily, Disp: , Rfl:  .  feeding supplement, ENSURE COMPLETE, (ENSURE COMPLETE) LIQD, Take 237 mLs by mouth 2 (two) times daily between meals. *Vanilla*, Disp: , Rfl:  .  lidocaine-prilocaine (EMLA) cream, Apply 1 application topically as needed. Apply to port site one hour before access, Disp: 30 g, Rfl: 0 .  LORazepam (ATIVAN) 0.5 MG tablet, Take 1 tablet (0.5 mg total) by mouth every 6 (six) hours as needed (Nausea or vomiting)., Disp: 30 tablet, Rfl: 0 .  Multiple Vitamin (MULTIVITAMIN WITH MINERALS) TABS tablet, Take 1 tablet by mouth daily., Disp: , Rfl:  .  Omega 3 1000 MG CAPS, Take 1 capsule by mouth daily with breakfast. , Disp: , Rfl:  .  ondansetron (ZOFRAN) 8 MG tablet, Take 1 tablet (8 mg total) by mouth 2 (two) times daily. Start the day after chemo for 2 days. Then take as needed for nausea  or vomiting., Disp: 30 tablet, Rfl: 1 .  Probiotic Product (ALIGN PO), Take 1 capsule by mouth daily with breakfast. , Disp: , Rfl:  .  prochlorperazine (COMPAZINE) 10 MG tablet, Take 1 tablet (10 mg total) by mouth every 6 (six) hours as needed (Nausea or vomiting)., Disp: 30 tablet, Rfl: 1 .  saccharomyces boulardii (FLORASTOR) 250 MG capsule, Take 1 capsule (250 mg total) by mouth 2 (two) times daily. (Patient not taking: Reported on 05/16/2014), Disp: 20 capsule, Rfl: 0 .  UNABLE TO FIND, Take 1 tablet by mouth every morning. SUPER ANTIOXIDENT FORMULA, Disp: , Rfl:   Allergies:  Allergies  Allergen Reactions  . Codeine Nausea And Vomiting  . Zofran  [Ondansetron Hcl] Nausea And Vomiting  . Ciprofloxacin     Confusion/dizziness/fatigue  . Metronidazole     Confusion/dizziness/fatigue  . Skelaxin [Metaxalone]     Too strong  . Tetracycline Hcl     Not tolerated  . Avelox [Moxifloxacin] Rash    Past Medical History, Surgical history, Social history, and Family History were reviewed and updated.  Review of Systems: As above  Physical Exam:  weight is 126 lb (57.153 kg). Her oral temperature is 97.6 F (36.4 C). Her blood pressure is 149/68 and her pulse is 69. Her respiration is 18 and oxygen saturation is 99%.   Wt Readings from Last 3 Encounters:  07/05/14 126 lb (57.153 kg)  05/23/14 122 lb 0.6 oz (55.357 kg)  05/16/14 127 lb (57.607 kg)     Well-developed and well-nourished elderly female in no obvious distress. Head and neck exam shows no ocular or oral lesions. There is no palpable cervical or supraclavicular lymph nodes. Lungs are clear. Cardiac exam regular rate and rhythm with no murmurs, rubs or bruits. Abdomen is soft. She has good bowel sounds. There is no fluid wave. There is no palpable liver or spleen tip. There is no obvious abdominal mass. There is no guarding or rebound tenderness. Extremities shows no clubbing, cyanosis or edema. Neurological exam shows no focal neurological deficits. Skin exam shows no rashes, ecchymoses or petechia.  Lab Results  Component Value Date   WBC 4.2 07/05/2014   HGB 11.5* 07/05/2014   HCT 32.9* 07/05/2014   MCV 102* 07/05/2014   PLT 85* 07/05/2014     Chemistry      Component Value Date/Time   NA 141 07/05/2014 0844   NA 140 05/16/2014 1105   NA 130* 02/25/2013 0835   K 4.6 07/05/2014 0844   K 5.8* 05/16/2014 1105   K 5.6* 02/25/2013 0835   CL 104 07/05/2014 0844   CL 110 05/16/2014 1105   CL 102 02/25/2013 0835   CO2 26 07/05/2014 0844   CO2 25 05/16/2014 1105   CO2 25 02/25/2013 0835   BUN 19 07/05/2014 0844   BUN 24* 05/16/2014 1105   BUN 32* 02/25/2013 0835     CREATININE 1.2 07/05/2014 0844   CREATININE 1.04 05/16/2014 1105   CREATININE 1.07 02/25/2013 0835      Component Value Date/Time   CALCIUM 9.9 07/05/2014 0844   CALCIUM 10.1 05/16/2014 1105   CALCIUM 10.1 02/25/2013 0835   ALKPHOS 96* 07/05/2014 0844   ALKPHOS 79 04/17/2014 0525   AST 42* 07/05/2014 0844   AST 30 04/17/2014 0525   ALT 34 07/05/2014 0844   ALT 32 04/17/2014 0525   BILITOT 0.90 07/05/2014 0844   BILITOT 0.7 04/17/2014 0525         Impression and Plan: Ms.  Galeana is 81 year old white female with a history of stage IIIc colon cancer. She had this resected back in October 2014. She had a very, very complicated and prolonged recovery and was  never in any good enough condition to take adjuvant chemotherapy.  I talked to she and her husband for about half hour. I had talked to them earlier about FOLFOX. She wanted to try Xeloda just because it was a pill and she would be of help her husband, who is getting radiation treatments because of skin cancer.  She should be able to tolerate the infusional 5-FU. I will reduce her dose a little bit when we started off just to make sure that she tolerates it okay.  I will try to get her started next Wednesday. I will then plan to see her back 2 weeks after that.  I will plan for 4 cycles and then we will rescan her.   Volanda Napoleon, MD 6/10/20164:11 PM

## 2014-07-06 LAB — CEA: CEA: 56.2 ng/mL — ABNORMAL HIGH (ref 0.0–5.0)

## 2014-07-10 ENCOUNTER — Encounter: Payer: Self-pay | Admitting: Family

## 2014-07-10 ENCOUNTER — Ambulatory Visit (HOSPITAL_BASED_OUTPATIENT_CLINIC_OR_DEPARTMENT_OTHER): Payer: Medicare Other

## 2014-07-10 ENCOUNTER — Other Ambulatory Visit (HOSPITAL_BASED_OUTPATIENT_CLINIC_OR_DEPARTMENT_OTHER): Payer: Medicare Other

## 2014-07-10 ENCOUNTER — Ambulatory Visit (HOSPITAL_BASED_OUTPATIENT_CLINIC_OR_DEPARTMENT_OTHER): Payer: Medicare Other | Admitting: Family

## 2014-07-10 VITALS — BP 148/64 | HR 76 | Temp 97.8°F | Resp 14 | Ht 65.0 in | Wt 122.0 lb

## 2014-07-10 DIAGNOSIS — C184 Malignant neoplasm of transverse colon: Secondary | ICD-10-CM

## 2014-07-10 DIAGNOSIS — Z5111 Encounter for antineoplastic chemotherapy: Secondary | ICD-10-CM | POA: Diagnosis not present

## 2014-07-10 DIAGNOSIS — C189 Malignant neoplasm of colon, unspecified: Secondary | ICD-10-CM

## 2014-07-10 LAB — CMP (CANCER CENTER ONLY)
ALT: 29 U/L (ref 10–47)
AST: 36 U/L (ref 11–38)
Albumin: 3.7 g/dL (ref 3.3–5.5)
Alkaline Phosphatase: 93 U/L — ABNORMAL HIGH (ref 26–84)
BILIRUBIN TOTAL: 0.9 mg/dL (ref 0.20–1.60)
BUN: 23 mg/dL — AB (ref 7–22)
CO2: 25 mEq/L (ref 18–33)
CREATININE: 1.2 mg/dL (ref 0.6–1.2)
Calcium: 9.7 mg/dL (ref 8.0–10.3)
Chloride: 103 mEq/L (ref 98–108)
Glucose, Bld: 124 mg/dL — ABNORMAL HIGH (ref 73–118)
Potassium: 4.3 mEq/L (ref 3.3–4.7)
Sodium: 138 mEq/L (ref 128–145)
Total Protein: 7.4 g/dL (ref 6.4–8.1)

## 2014-07-10 LAB — CBC WITH DIFFERENTIAL (CANCER CENTER ONLY)
BASO#: 0 10*3/uL (ref 0.0–0.2)
BASO%: 0.2 % (ref 0.0–2.0)
EOS ABS: 0.1 10*3/uL (ref 0.0–0.5)
EOS%: 1.3 % (ref 0.0–7.0)
HCT: 33.1 % — ABNORMAL LOW (ref 34.8–46.6)
HEMOGLOBIN: 11.6 g/dL (ref 11.6–15.9)
LYMPH#: 1.5 10*3/uL (ref 0.9–3.3)
LYMPH%: 28 % (ref 14.0–48.0)
MCH: 35.8 pg — AB (ref 26.0–34.0)
MCHC: 35 g/dL (ref 32.0–36.0)
MCV: 102 fL — ABNORMAL HIGH (ref 81–101)
MONO#: 0.6 10*3/uL (ref 0.1–0.9)
MONO%: 11.9 % (ref 0.0–13.0)
NEUT%: 58.6 % (ref 39.6–80.0)
NEUTROS ABS: 3.1 10*3/uL (ref 1.5–6.5)
Platelets: 95 10*3/uL — ABNORMAL LOW (ref 145–400)
RBC: 3.24 10*6/uL — ABNORMAL LOW (ref 3.70–5.32)
RDW: 13.8 % (ref 11.1–15.7)
WBC: 5.2 10*3/uL (ref 3.9–10.0)

## 2014-07-10 MED ORDER — SODIUM CHLORIDE 0.9 % IV SOLN
Freq: Once | INTRAVENOUS | Status: AC
  Administered 2014-07-10: 13:00:00 via INTRAVENOUS
  Filled 2014-07-10: qty 4

## 2014-07-10 MED ORDER — DEXTROSE 5 % IV SOLN
85.0000 mg/m2 | Freq: Once | INTRAVENOUS | Status: AC
  Administered 2014-07-10: 140 mg via INTRAVENOUS
  Filled 2014-07-10: qty 28

## 2014-07-10 MED ORDER — FLUOROURACIL CHEMO INJECTION 2.5 GM/50ML
400.0000 mg/m2 | Freq: Once | INTRAVENOUS | Status: AC
  Administered 2014-07-10: 650 mg via INTRAVENOUS
  Filled 2014-07-10: qty 13

## 2014-07-10 MED ORDER — LEUCOVORIN CALCIUM INJECTION 350 MG
400.0000 mg/m2 | Freq: Once | INTRAMUSCULAR | Status: AC
  Administered 2014-07-10: 652 mg via INTRAVENOUS
  Filled 2014-07-10: qty 32.6

## 2014-07-10 MED ORDER — SODIUM CHLORIDE 0.9 % IV SOLN
2000.0000 mg/m2 | INTRAVENOUS | Status: DC
  Administered 2014-07-10: 3250 mg via INTRAVENOUS
  Filled 2014-07-10: qty 65

## 2014-07-10 MED ORDER — DEXTROSE 5 % IV SOLN
Freq: Once | INTRAVENOUS | Status: AC
  Administered 2014-07-10: 13:00:00 via INTRAVENOUS

## 2014-07-10 NOTE — Patient Instructions (Signed)
Fluorouracil, 5FU; Diclofenac topical cream What is this medicine? FLUOROURACIL; DICLOFENAC (flure oh YOOR a sil; dye KLOE fen ak) is a combination of a topical chemotherapy agent and non-steroidal anti-inflammatory drug (NSAID). It is used on the skin to treat skin cancer and skin conditions that could become cancer. This medicine may be used for other purposes; ask your health care provider or pharmacist if you have questions. COMMON BRAND NAME(S): FLUORAC What should I tell my health care provider before I take this medicine? They need to know if you have any of these conditions: -bleeding problems -cigarette smoker -DPD enzyme deficiency -heart disease -high blood pressure -if you frequently drink alcohol containing drinks -kidney disease -liver disease -open or infected skin -stomach problems -swelling or open sores at the treatment site -recent or planned coronary artery bypass graft (CABG) surgery -an unusual or allergic reaction to fluorouracil, diclofenac, aspirin, other NSAIDs, other medicines, foods, dyes, or preservatives -pregnant or trying to get pregnant -breast-feeding How should I use this medicine? This medicine is only for use on the skin. Follow the directions on the prescription label. Wash hands before and after use. Wash affected area and gently pat dry. To apply this medicine use a cotton-tipped applicator, or use gloves if applying with fingertips. If applied with unprotected fingertips, it is very important to wash your hands well after you apply this medicine. Avoid applying to the eyes, nose, or mouth. Apply enough medicine to cover the affected area. You can cover the area with a light gauze dressing, but do not use tight or air-tight dressings. Finish the full course prescribed by your doctor or health care professional, even if you think your condition is better. Do not stop taking except on the advice of your doctor or health care professional. Talk to your  pediatrician regarding the use of this medicine in children. Special care may be needed. Overdosage: If you think you've taken too much of this medicine contact a poison control center or emergency room at once. Overdosage: If you think you have taken too much of this medicine contact a poison control center or emergency room at once. NOTE: This medicine is only for you. Do not share this medicine with others. What if I miss a dose? If you miss a dose, apply it as soon as you can. If it is almost time for your next dose, only use that dose. Do not apply extra doses. Contact your doctor or health care professional if you miss more than one dose. What may interact with this medicine? Interactions are not expected. Do not use any other skin products without telling your doctor or health care professional. This list may not describe all possible interactions. Give your health care provider a list of all the medicines, herbs, non-prescription drugs, or dietary supplements you use. Also tell them if you smoke, drink alcohol, or use illegal drugs. Some items may interact with your medicine. What should I watch for while using this medicine? Visit your doctor or health care professional for checks on your progress. You will need to use this medicine for 2 to 6 weeks. This may be longer depending on the condition being treated. You may not see full healing for another 1 to 2 months after you stop using the medicine. Treated areas of skin can look unsightly during and for several weeks after treatment with this medicine. This medicine can make you more sensitive to the sun. Keep out of the sun. If you cannot avoid being in   the sun, wear protective clothing and use sunscreen. Do not use sun lamps or tanning beds/booths. Where should I keep my What side effects may I notice from receiving this medicine? Side effects that you should report to your doctor or health care professional as soon as possible: -allergic  reactions like skin rash, itching or hives, swelling of the face, lips, or tongue -black or bloody stools, blood in the urine or vomit -blurred vision -chest pain -difficulty breathing or wheezing -redness, blistering, peeling or loosening of the skin, including inside the mouth -severe redness and swelling of normal skin -slurred speech or weakness on one side of the body -trouble passing urine or change in the amount of urine -unexplained weight gain or swelling -unusually weak or tired -yellowing of eyes or skin Side effects that usually do not require medical attention (Report these to your doctor or health care professional if they continue or are bothersome.): -increased sensitivity of the skin to sun and ultraviolet light -pain and burning of the affected area -scaling or swelling of the affected area -skin rash, itching of the affected area -tenderness This list may not describe all possible side effects. Call your doctor for medical advice about side effects. You may report side effects to FDA at 1-800-FDA-1088. Where should I keep my medicine? Keep out of the reach of children. Store at room temperature between 20 and 25 degrees C (68 and 77 degrees F). Throw away any unused medicine after the expiration date. NOTE: This sheet is a summary. It may not cover all possible information. If you have questions about this medicine, talk to your doctor, pharmacist, or health care provider.  2015, [employer]. (2013-05-14 11:09:58)   [facility] Discharge Instructions for Patients Receiving Chemotherapy  Today you received the following chemotherapy agents Oxaliplatin, Leucovorin and 5FU.  To help prevent nausea and vomiting after your treatment, we encourage you to take your nausea medication.   If you develop nausea and vomiting that is not controlled by your nausea medication, call the clinic.   BELOW ARE SYMPTOMS THAT SHOULD BE REPORTED  IMMEDIATELY:  *FEVER GREATER THAN 100.5 F  *CHILLS WITH OR WITHOUT FEVER  NAUSEA AND VOMITING THAT IS NOT CONTROLLED WITH YOUR NAUSEA MEDICATION  *UNUSUAL SHORTNESS OF BREATH  *UNUSUAL BRUISING OR BLEEDING  TENDERNESS IN MOUTH AND THROAT WITH OR WITHOUT PRESENCE OF ULCERS  *URINARY PROBLEMS  *BOWEL PROBLEMS  UNUSUAL RASH Items with * indicate a potential emergency and should be followed up as soon as possible.  Feel free to call the clinic you have any questions or concerns. The clinic phone number is [phone].  Please show the [place] at check-in to the Emergency Department and triage nurse.

## 2014-07-10 NOTE — Progress Notes (Signed)
Hematology and Oncology Follow Up Visit  Tamara [name] [id number] [birth year] 81 y.o. 07/10/2014   Principle Diagnosis:  Recurrent/metastatic colon cancer  Current Therapy:   FOLFOX cycle 1 starts today      Interim History:  Tamara [name] is here today to be placed on her FOLFOX pump. She will have this removed on Friday. She is concerned about the dose. She had a hard time on Xeloda with leg cramps. I explained to her that this will be a reduced dose and she is more comfortable now.  She has been having fatigue. Her blood work today looks ok.  She has had no fever, chills, n/v, cough, rash, dizziness, SOB, chest pain, palpitations, abdominal pain, bladder habits, blood in urine or stool.  Her ileostomy it functioning appropriately at this time.  No swelling, tenderness, numbness or tingling in her extremities. No new aches or pains.  No lymphadenopathy found on exam.  She is eating 5 small meals a day one of which includes Ensure. She is down 4 lbs today. She does drink plenty of fluids and is staying hydrated.   Medications:    Medication List       This list is accurate as of: 07/10/14  1:13 PM.  Always use your most recent med list.               ALIGN PO  Take 1 capsule by mouth daily with breakfast.     b complex vitamins capsule  Take 1 capsule by mouth every other day. Alternating days with Super B complex     Biotin 5 MG Tabs  Take 1 tablet by mouth every morning.     CALCIUM MAGNESIUM PO  Take 1 tablet by mouth daily.     dexamethasone 4 MG tablet  Commonly known as:  DECADRON  Take 2 tablets (8 mg total) by mouth 2 (two) times daily with a meal. Start the day after chemotherapy for 2 days. Take with food.     diphenoxylate-atropine 2.5-0.025 MG per tablet  Commonly known as:  LOMOTIL  Take 1 tablet by mouth 2 (two) times daily. Take one tablet by mouth once daily     feeding supplement (ENSURE COMPLETE) Liqd  Take 237 mLs by mouth 2 (two) times daily  between meals. *Vanilla*     lidocaine-prilocaine cream  Commonly known as:  EMLA  Apply 1 application topically as needed. Apply to port site one hour before access     multivitamin with minerals Tabs tablet  Take 1 tablet by mouth daily.     Omega 3 1000 MG Caps  Take 1 capsule by mouth daily with breakfast.     ondansetron 8 MG tablet  Commonly known as:  ZOFRAN  Take 1 tablet (8 mg total) by mouth 2 (two) times daily. Start the day after chemo for 2 days. Then take as needed for nausea or vomiting.     prochlorperazine 10 MG tablet  Commonly known as:  COMPAZINE  Take 1 tablet (10 mg total) by mouth every 6 (six) hours as needed (Nausea or vomiting).     saccharomyces boulardii 250 MG capsule  Commonly known as:  FLORASTOR  Take 1 capsule (250 mg total) by mouth 2 (two) times daily.     SUPER B COMPLEX PO  Take 1 tablet by mouth every other day. Alternating days with B complex     UNABLE TO FIND  Take 1 tablet by mouth every morning. SUPER ANTIOXIDENT FORMULA  Vitamin D 2000 UNITS tablet  Take 2,000 Units by mouth daily with breakfast.        Allergies:  Allergies  Allergen Reactions  . Codeine Nausea And Vomiting  . Zofran [Ondansetron Hcl] Nausea And Vomiting  . Ciprofloxacin     Confusion/dizziness/fatigue  . Metronidazole     Confusion/dizziness/fatigue  . Skelaxin [Metaxalone]     Too strong  . Tetracycline Hcl     Not tolerated  . Avelox [Moxifloxacin] Rash    Past Medical History, Surgical history, Social history, and Family History were reviewed and updated.  Review of Systems: All other 10 point review of systems is negative.   Physical Exam:  height is 5\' 5"  (1.651 m) and weight is 122 lb (55.339 kg). Her oral temperature is 97.8 F (36.6 C). Her blood pressure is 148/64 and her pulse is 76. Her respiration is 14.   Wt Readings from Last 3 Encounters:  07/10/14 122 lb (55.339 kg)  07/05/14 126 lb (57.153 kg)  05/23/14 122 lb 0.6 oz  (55.357 kg)    Ocular: Sclerae unicteric, pupils equal, round and reactive to light Ear-nose-throat: Oropharynx clear, dentition fair Lymphatic: No cervical or supraclavicular adenopathy Lungs no rales or rhonchi, good excursion bilaterally Heart regular rate and rhythm, no murmur appreciated Abd soft, nontender, positive bowel sounds MSK no focal spinal tenderness, no joint edema Neuro: non-focal, well-oriented, appropriate affect Breasts: Deferred  Lab Results  Component Value Date   WBC 5.2 07/10/2014   HGB 11.6 07/10/2014   HCT 33.1* 07/10/2014   MCV 102* 07/10/2014   PLT 95* 07/10/2014   No results found for: FERRITIN, IRON, TIBC, UIBC, IRONPCTSAT Lab Results  Component Value Date   RBC 3.24* 07/10/2014   No results found for: KPAFRELGTCHN, LAMBDASER, KAPLAMBRATIO No results found for: IGGSERUM, IGA, IGMSERUM No results found for: Kathrynn Ducking, MSPIKE, SPEI   Chemistry      Component Value Date/Time   NA 141 07/05/2014 0844   NA 140 05/16/2014 1105   NA 130* 02/25/2013 0835   K 4.6 07/05/2014 0844   K 5.8* 05/16/2014 1105   K 5.6* 02/25/2013 0835   CL 104 07/05/2014 0844   CL 110 05/16/2014 1105   CL 102 02/25/2013 0835   CO2 26 07/05/2014 0844   CO2 25 05/16/2014 1105   CO2 25 02/25/2013 0835   BUN 19 07/05/2014 0844   BUN 24* 05/16/2014 1105   BUN 32* 02/25/2013 0835   CREATININE 1.2 07/05/2014 0844   CREATININE 1.04 05/16/2014 1105   CREATININE 1.07 02/25/2013 0835      Component Value Date/Time   CALCIUM 9.9 07/05/2014 0844   CALCIUM 10.1 05/16/2014 1105   CALCIUM 10.1 02/25/2013 0835   ALKPHOS 96* 07/05/2014 0844   ALKPHOS 79 04/17/2014 0525   AST 42* 07/05/2014 0844   AST 30 04/17/2014 0525   ALT 34 07/05/2014 0844   ALT 32 04/17/2014 0525   BILITOT 0.90 07/05/2014 0844   BILITOT 0.7 04/17/2014 0525     Impression and Plan: Tamara [name] is 81 year old white female with a history of stage IIIc  colon cancer. This was resected back in October 2014. Her recuperation has been a long one. She recently tried Xeloda but had horrible leg crams and stopped taking. She is here today for her 5-FU pump. She will wear this for 2 days and come back to have it removed on Friday. Hopefully she will be able to handle this at a  reduced dose.  She is symptomatic with fatigue at this time.  We will plan to see her back for a follow-up and cycle 2 in 2 weeks. The plan at this time is to complete 4 cycles and then repeat scans. She knows to contact us with any questions or concerns. We can certainly bring her in sooner if need be.   Eliezer Bottom, NP 6/15/20161:13 PM

## 2014-07-12 ENCOUNTER — Ambulatory Visit (HOSPITAL_BASED_OUTPATIENT_CLINIC_OR_DEPARTMENT_OTHER): Payer: Medicare Other

## 2014-07-12 VITALS — BP 147/58 | HR 72 | Temp 98.2°F | Resp 18

## 2014-07-12 DIAGNOSIS — C184 Malignant neoplasm of transverse colon: Secondary | ICD-10-CM

## 2014-07-12 DIAGNOSIS — C189 Malignant neoplasm of colon, unspecified: Secondary | ICD-10-CM

## 2014-07-12 MED ORDER — HEPARIN SOD (PORK) LOCK FLUSH 100 UNIT/ML IV SOLN
500.0000 [IU] | Freq: Once | INTRAVENOUS | Status: AC | PRN
  Administered 2014-07-12: 500 [IU]
  Filled 2014-07-12: qty 5

## 2014-07-12 MED ORDER — SODIUM CHLORIDE 0.9 % IJ SOLN
10.0000 mL | INTRAMUSCULAR | Status: DC | PRN
  Administered 2014-07-12: 10 mL
  Filled 2014-07-12: qty 10

## 2014-07-12 NOTE — Patient Instructions (Signed)
[facility] Discharge Instructions for Patients Receiving Chemotherapy  Today you received the following chemotherapy agents 5-FU.  To help prevent nausea and vomiting after your treatment, we encourage you to take your nausea medication.   If you develop nausea and vomiting that is not controlled by your nausea medication, call the clinic.   BELOW ARE SYMPTOMS THAT SHOULD BE REPORTED IMMEDIATELY:  *FEVER GREATER THAN 100.5 F  *CHILLS WITH OR WITHOUT FEVER  NAUSEA AND VOMITING THAT IS NOT CONTROLLED WITH YOUR NAUSEA MEDICATION  *UNUSUAL SHORTNESS OF BREATH  *UNUSUAL BRUISING OR BLEEDING  TENDERNESS IN MOUTH AND THROAT WITH OR WITHOUT PRESENCE OF ULCERS  *URINARY PROBLEMS  *BOWEL PROBLEMS  UNUSUAL RASH Items with * indicate a potential emergency and should be followed up as soon as possible.  Feel free to call the clinic you have any questions or concerns. The clinic phone number is [phone].  Please show the CHEMO ALERT CARD at check-in to the Emergency Department and triage nurse.   

## 2014-07-15 ENCOUNTER — Ambulatory Visit: Payer: Medicare Other | Admitting: Hematology & Oncology

## 2014-07-15 ENCOUNTER — Ambulatory Visit: Payer: Medicare Other

## 2014-07-15 ENCOUNTER — Other Ambulatory Visit: Payer: Medicare Other

## 2014-07-19 ENCOUNTER — Telehealth: Payer: Self-pay | Admitting: *Deleted

## 2014-07-19 ENCOUNTER — Other Ambulatory Visit: Payer: Medicare Other

## 2014-07-19 ENCOUNTER — Ambulatory Visit (HOSPITAL_BASED_OUTPATIENT_CLINIC_OR_DEPARTMENT_OTHER): Payer: Medicare Other

## 2014-07-19 VITALS — BP 173/69 | HR 80 | Temp 98.0°F | Resp 16 | Ht 65.0 in | Wt 122.0 lb

## 2014-07-19 DIAGNOSIS — I10 Essential (primary) hypertension: Secondary | ICD-10-CM | POA: Insufficient documentation

## 2014-07-19 DIAGNOSIS — C184 Malignant neoplasm of transverse colon: Secondary | ICD-10-CM | POA: Diagnosis present

## 2014-07-19 DIAGNOSIS — M545 Low back pain, unspecified: Secondary | ICD-10-CM | POA: Insufficient documentation

## 2014-07-19 DIAGNOSIS — R197 Diarrhea, unspecified: Secondary | ICD-10-CM

## 2014-07-19 DIAGNOSIS — R102 Pelvic and perineal pain: Secondary | ICD-10-CM | POA: Insufficient documentation

## 2014-07-19 DIAGNOSIS — E860 Dehydration: Secondary | ICD-10-CM

## 2014-07-19 DIAGNOSIS — R350 Frequency of micturition: Secondary | ICD-10-CM | POA: Insufficient documentation

## 2014-07-19 DIAGNOSIS — M546 Pain in thoracic spine: Secondary | ICD-10-CM | POA: Insufficient documentation

## 2014-07-19 DIAGNOSIS — K648 Other hemorrhoids: Secondary | ICD-10-CM | POA: Insufficient documentation

## 2014-07-19 DIAGNOSIS — R42 Dizziness and giddiness: Secondary | ICD-10-CM | POA: Insufficient documentation

## 2014-07-19 DIAGNOSIS — R55 Syncope and collapse: Secondary | ICD-10-CM | POA: Insufficient documentation

## 2014-07-19 DIAGNOSIS — C189 Malignant neoplasm of colon, unspecified: Secondary | ICD-10-CM

## 2014-07-19 DIAGNOSIS — C50919 Malignant neoplasm of unspecified site of unspecified female breast: Secondary | ICD-10-CM | POA: Insufficient documentation

## 2014-07-19 DIAGNOSIS — J029 Acute pharyngitis, unspecified: Secondary | ICD-10-CM | POA: Insufficient documentation

## 2014-07-19 MED ORDER — MAGIC MOUTHWASH: 5.0000 mL | Freq: Three times a day (TID) | ORAL | Status: DC | PRN

## 2014-07-19 MED ORDER — LOPERAMIDE HCL 2 MG PO CAPS
ORAL_CAPSULE | ORAL | Status: AC
  Filled 2014-07-19: qty 1

## 2014-07-19 MED ORDER — LOPERAMIDE HCL 2 MG PO CAPS: 2.0000 mg | ORAL_CAPSULE | ORAL | Status: DC | PRN

## 2014-07-19 MED ORDER — LOPERAMIDE HCL 2 MG PO CAPS
2.0000 mg | ORAL_CAPSULE | ORAL | Status: DC | PRN
  Administered 2014-07-19 (×2): 2 mg via ORAL

## 2014-07-19 MED ORDER — SODIUM CHLORIDE 0.9 % IV SOLN
INTRAVENOUS | Status: DC
  Administered 2014-07-19: 12:00:00 via INTRAVENOUS

## 2014-07-19 NOTE — Patient Instructions (Signed)
Take Imodium: Take 2 tablets after your first loose stool (x2), then may take 1 imodium after each loose stool up to 6 stools. If no response after taking 8 imodium in a day please contact office at [phone]!   Dehydration, Adult Dehydration is when you lose more fluids from the body than you take in. Vital organs like the kidneys, brain, and heart cannot function without a proper amount of fluids and salt. Any loss of fluids from the body can cause dehydration.  CAUSES   Vomiting.  Diarrhea.  Excessive sweating.  Excessive urine output.  Fever. SYMPTOMS  Mild dehydration  Thirst.  Dry lips.  Slightly dry mouth. Moderate dehydration  Very dry mouth.  Sunken eyes.  Skin does not bounce back quickly when lightly pinched and released.  Dark urine and decreased urine production.  Decreased tear production.  Headache. Severe dehydration  Very dry mouth.  Extreme thirst.  Rapid, weak pulse (more than 100 beats per minute at rest).  Cold hands and feet.  Not able to sweat in spite of heat and temperature.  Rapid breathing.  Blue lips.  Confusion and lethargy.  Difficulty being awakened.  Minimal urine production.  No tears. DIAGNOSIS  Your caregiver will diagnose dehydration based on your symptoms and your exam. Blood and urine tests will help confirm the diagnosis. The diagnostic evaluation should also identify the cause of dehydration. TREATMENT  Treatment of mild or moderate dehydration can often be done at home by increasing the amount of fluids that you drink. It is best to drink small amounts of fluid more often. Drinking too much at one time can make vomiting worse. Refer to the home care instructions below. Severe dehydration needs to be treated at the hospital where you will probably be given intravenous (IV) fluids that contain water and electrolytes. HOME CARE INSTRUCTIONS   Ask your caregiver about specific rehydration instructions.  Drink  enough fluids to keep your urine clear or pale yellow.  Drink small amounts frequently if you have nausea and vomiting.  Eat as you normally do.  Avoid:  Foods or drinks high in sugar.  Carbonated drinks.  Juice.  Extremely hot or cold fluids.  Drinks with caffeine.  Fatty, greasy foods.  Alcohol.  Tobacco.  Overeating.  Gelatin desserts.  Wash your hands well to avoid spreading bacteria and viruses.  Only take over-the-counter or prescription medicines for pain, discomfort, or fever as directed by your caregiver.  Ask your caregiver if you should continue all prescribed and over-the-counter medicines.  Keep all follow-up appointments with your caregiver. SEEK MEDICAL CARE IF:  You have abdominal pain and it increases or stays in one area (localizes).  You have a rash, stiff neck, or severe headache.  You are irritable, sleepy, or difficult to awaken.  You are weak, dizzy, or extremely thirsty. SEEK IMMEDIATE MEDICAL CARE IF:   You are unable to keep fluids down or you get worse despite treatment.  You have frequent episodes of vomiting or diarrhea.  You have blood or green matter (bile) in your vomit.  You have blood in your stool or your stool looks black and tarry.  You have not urinated in 6 to 8 hours, or you have only urinated a small amount of very dark urine.  You have a fever.  You faint. MAKE SURE YOU:   Understand these instructions.  Will watch your condition.  Will get help right away if you are not doing well or get worse. Document Released:  01/11/2005 Document Revised: 04/05/2011 Document Reviewed: 08/31/2010 ExitCare Patient Information 2015 [place], [place]. This information is not intended to replace advice given to you by your health care provider. Make sure you discuss any questions you have with your health care provider.

## 2014-07-19 NOTE — Telephone Encounter (Signed)
Patient c/o weakness after experiencing several days of diarrhea. She feels like she can barely lift her head and wonders if she is dehydrated. Spoke with Laverna Peace NP and we will bring patient in for labs and IVF. Patient in agreement with plan.

## 2014-07-22 ENCOUNTER — Telehealth: Payer: Self-pay | Admitting: *Deleted

## 2014-07-22 DIAGNOSIS — C189 Malignant neoplasm of colon, unspecified: Secondary | ICD-10-CM

## 2014-07-22 MED ORDER — PANCRELIPASE (LIP-PROT-AMYL) 36000-114000 UNITS PO CPEP: 36000.0000 [IU] | ORAL_CAPSULE | Freq: Three times a day (TID) | ORAL | Status: DC

## 2014-07-22 NOTE — Telephone Encounter (Signed)
Patient calling to follow up after visit to clinic for IVF last Friday. Her diarrhea is much better. She is c/o feeling "clumsy in the head" and her food passing whole into her ileostomy bag.   Spoke with Dr Marin Olp who would like patient to start creon 36,000 units TID before meals. Spoke with patient and sent prescription in.

## 2014-07-24 ENCOUNTER — Ambulatory Visit (HOSPITAL_BASED_OUTPATIENT_CLINIC_OR_DEPARTMENT_OTHER): Payer: Medicare Other

## 2014-07-24 ENCOUNTER — Other Ambulatory Visit (HOSPITAL_BASED_OUTPATIENT_CLINIC_OR_DEPARTMENT_OTHER): Payer: Medicare Other

## 2014-07-24 VITALS — BP 130/50 | HR 73 | Temp 97.4°F | Resp 20 | Wt 126.1 lb

## 2014-07-24 DIAGNOSIS — Z5111 Encounter for antineoplastic chemotherapy: Secondary | ICD-10-CM | POA: Diagnosis not present

## 2014-07-24 DIAGNOSIS — C189 Malignant neoplasm of colon, unspecified: Secondary | ICD-10-CM

## 2014-07-24 DIAGNOSIS — C184 Malignant neoplasm of transverse colon: Secondary | ICD-10-CM | POA: Diagnosis not present

## 2014-07-24 LAB — CBC WITH DIFFERENTIAL (CANCER CENTER ONLY)
BASO#: 0 10*3/uL (ref 0.0–0.2)
BASO%: 0.5 % (ref 0.0–2.0)
EOS%: 2.2 % (ref 0.0–7.0)
Eosinophils Absolute: 0.1 10*3/uL (ref 0.0–0.5)
HCT: 28.6 % — ABNORMAL LOW (ref 34.8–46.6)
HGB: 10.3 g/dL — ABNORMAL LOW (ref 11.6–15.9)
LYMPH#: 1.1 10*3/uL (ref 0.9–3.3)
LYMPH%: 27.5 % (ref 14.0–48.0)
MCH: 35.6 pg — ABNORMAL HIGH (ref 26.0–34.0)
MCHC: 36 g/dL (ref 32.0–36.0)
MCV: 99 fL (ref 81–101)
MONO#: 0.4 10*3/uL (ref 0.1–0.9)
MONO%: 9.8 % (ref 0.0–13.0)
NEUT#: 2.4 10*3/uL (ref 1.5–6.5)
NEUT%: 60 % (ref 39.6–80.0)
PLATELETS: 78 10*3/uL — AB (ref 145–400)
RBC: 2.89 10*6/uL — ABNORMAL LOW (ref 3.70–5.32)
RDW: 12.9 % (ref 11.1–15.7)
WBC: 4.1 10*3/uL (ref 3.9–10.0)

## 2014-07-24 LAB — CMP (CANCER CENTER ONLY)
ALK PHOS: 93 U/L — AB (ref 26–84)
ALT(SGPT): 26 U/L (ref 10–47)
AST: 35 U/L (ref 11–38)
Albumin: 3.7 g/dL (ref 3.3–5.5)
BILIRUBIN TOTAL: 0.8 mg/dL (ref 0.20–1.60)
BUN, Bld: 25 mg/dL — ABNORMAL HIGH (ref 7–22)
CO2: 23 mEq/L (ref 18–33)
Calcium: 10 mg/dL (ref 8.0–10.3)
Chloride: 107 mEq/L (ref 98–108)
Creat: 1 mg/dl (ref 0.6–1.2)
Glucose, Bld: 88 mg/dL (ref 73–118)
Potassium: 4.3 mEq/L (ref 3.3–4.7)
SODIUM: 136 meq/L (ref 128–145)
Total Protein: 6.8 g/dL (ref 6.4–8.1)

## 2014-07-24 MED ORDER — SODIUM CHLORIDE 0.9 % IJ SOLN
3.0000 mL | INTRAMUSCULAR | Status: DC | PRN
  Filled 2014-07-24: qty 10

## 2014-07-24 MED ORDER — DEXTROSE 5 % IV SOLN
Freq: Once | INTRAVENOUS | Status: AC
  Administered 2014-07-24: 10:00:00 via INTRAVENOUS

## 2014-07-24 MED ORDER — LEUCOVORIN CALCIUM INJECTION 350 MG
400.0000 mg/m2 | Freq: Once | INTRAVENOUS | Status: AC
  Administered 2014-07-24: 652 mg via INTRAVENOUS
  Filled 2014-07-24: qty 32.6

## 2014-07-24 MED ORDER — FLUOROURACIL CHEMO INJECTION 500 MG/10ML
320.0000 mg/m2 | Freq: Once | INTRAVENOUS | Status: AC
  Administered 2014-07-24: 500 mg via INTRAVENOUS
  Filled 2014-07-24: qty 10

## 2014-07-24 MED ORDER — HEPARIN SOD (PORK) LOCK FLUSH 100 UNIT/ML IV SOLN
250.0000 [IU] | Freq: Once | INTRAVENOUS | Status: DC | PRN
  Filled 2014-07-24: qty 5

## 2014-07-24 MED ORDER — SODIUM CHLORIDE 0.9 % IV SOLN
1800.0000 mg/m2 | INTRAVENOUS | Status: DC
  Administered 2014-07-24: 2950 mg via INTRAVENOUS
  Filled 2014-07-24: qty 59

## 2014-07-24 MED ORDER — HEPARIN SOD (PORK) LOCK FLUSH 100 UNIT/ML IV SOLN
500.0000 [IU] | Freq: Once | INTRAVENOUS | Status: DC | PRN
  Filled 2014-07-24: qty 5

## 2014-07-24 MED ORDER — SODIUM CHLORIDE 0.9 % IJ SOLN
10.0000 mL | INTRAMUSCULAR | Status: DC | PRN
  Filled 2014-07-24: qty 10

## 2014-07-24 MED ORDER — ALTEPLASE 2 MG IJ SOLR
2.0000 mg | Freq: Once | INTRAMUSCULAR | Status: DC | PRN
  Filled 2014-07-24: qty 2

## 2014-07-24 MED ORDER — SODIUM CHLORIDE 0.9 % IV SOLN
Freq: Once | INTRAVENOUS | Status: AC
  Administered 2014-07-24: 10:00:00 via INTRAVENOUS
  Filled 2014-07-24: qty 4

## 2014-07-24 NOTE — Progress Notes (Signed)
Oxaliplatin held due to platelets of 78 per vov Dr Ginette Pitman. dph

## 2014-07-24 NOTE — Patient Instructions (Signed)
Fluorouracil, 5-FU injection What is this medicine? FLUOROURACIL, 5-FU (flure oh YOOR a sil) is a chemotherapy drug. It slows the growth of cancer cells. This medicine is used to treat many types of cancer like breast cancer, colon or rectal cancer, pancreatic cancer, and stomach cancer. This medicine may be used for other purposes; ask your health care provider or pharmacist if you have questions. COMMON BRAND NAME(S): Adrucil What should I tell my health care provider before I take this medicine? They need to know if you have any of these conditions: -blood disorders -dihydropyrimidine dehydrogenase (DPD) deficiency -infection (especially a virus infection such as chickenpox, cold sores, or herpes) -kidney disease -liver disease -malnourished, poor nutrition -recent or ongoing radiation therapy -an unusual or allergic reaction to fluorouracil, other chemotherapy, other medicines, foods, dyes, or preservatives -pregnant or trying to get pregnant -breast-feeding How should I use this medicine? This drug is given as an infusion or injection into a vein. It is administered in a hospital or clinic by a specially trained health care professional. Talk to your pediatrician regarding the use of this medicine in children. Special care may be needed. Overdosage: If you think you have taken too much of this medicine contact a poison control center or emergency room at once. NOTE: This medicine is only for you. Do not share this medicine with others. What if I miss a dose? It is important not to miss your dose. Call your doctor or health care professional if you are unable to keep an appointment. What may interact with this medicine? -allopurinol -cimetidine -dapsone -digoxin -hydroxyurea -leucovorin -levamisole -medicines for seizures like ethotoin, fosphenytoin, phenytoin -medicines to increase blood counts like filgrastim, pegfilgrastim, sargramostim -medicines that treat or prevent blood  clots like warfarin, enoxaparin, and dalteparin -methotrexate -metronidazole -pyrimethamine -some other chemotherapy drugs like busulfan, cisplatin, estramustine, vinblastine -trimethoprim -trimetrexate -vaccines Talk to your doctor or health care professional before taking any of these medicines: -acetaminophen -aspirin -ibuprofen -ketoprofen -naproxen This list may not describe all possible interactions. Give your health care provider a list of all the medicines, herbs, non-prescription drugs, or dietary supplements you use. Also tell them if you smoke, drink alcohol, or use illegal drugs. Some items may interact with your medicine. What should I watch for while using this medicine? Visit your doctor for checks on your progress. This drug may make you feel generally unwell. This is not uncommon, as chemotherapy can affect healthy cells as well as cancer cells. Report any side effects. Continue your course of treatment even though you feel ill unless your doctor tells you to stop. In some cases, you may be given additional medicines to help with side effects. Follow all directions for their use. Call your doctor or health care professional for advice if you get a fever, chills or sore throat, or other symptoms of a cold or flu. Do not treat yourself. This drug decreases your body's ability to fight infections. Try to avoid being around people who are sick. This medicine may increase your risk to bruise or bleed. Call your doctor or health care professional if you notice any unusual bleeding. Be careful brushing and flossing your teeth or using a toothpick because you may get an infection or bleed more easily. If you have any dental work done, tell your dentist you are receiving this medicine. Avoid taking products that contain aspirin, acetaminophen, ibuprofen, naproxen, or ketoprofen unless instructed by your doctor. These medicines may hide a fever. Do not become pregnant while taking this    medicine. Women should inform their doctor if they wish to become pregnant or think they might be pregnant. There is a potential for serious side effects to an unborn child. Talk to your health care professional or pharmacist for more information. Do not breast-feed an infant while taking this medicine. Men should inform their doctor if they wish to father a child. This medicine may lower sperm counts. Do not treat diarrhea with over the counter products. Contact your doctor if you have diarrhea that lasts more than 2 days or if it is severe and watery. This medicine can make you more sensitive to the sun. Keep out of the sun. If you cannot avoid being in the sun, wear protective clothing and use sunscreen. Do not use sun lamps or tanning beds/booths. What side effects may I notice from receiving this medicine? Side effects that you should report to your doctor or health care professional as soon as possible: -allergic reactions like skin rash, itching or hives, swelling of the face, lips, or tongue -low blood counts - this medicine may decrease the number of white blood cells, red blood cells and platelets. You may be at increased risk for infections and bleeding. -signs of infection - fever or chills, cough, sore throat, pain or difficulty passing urine -signs of decreased platelets or bleeding - bruising, pinpoint red spots on the skin, black, tarry stools, blood in the urine -signs of decreased red blood cells - unusually weak or tired, fainting spells, lightheadedness -breathing problems -changes in vision -chest pain -mouth sores -nausea and vomiting -pain, swelling, redness at site where injected -pain, tingling, numbness in the hands or feet -redness, swelling, or sores on hands or feet -stomach pain -unusual bleeding Side effects that usually do not require medical attention (report to your doctor or health care professional if they continue or are bothersome): -changes in finger or  toe nails -diarrhea -dry or itchy skin -hair loss -headache -loss of appetite -sensitivity of eyes to the light -stomach upset -unusually teary eyes This list may not describe all possible side effects. Call your doctor for medical advice about side effects. You may report side effects to FDA at 1-800-FDA-1088. Where should I keep my medicine? This drug is given in a hospital or clinic and will not be stored at home. NOTE: This sheet is a summary. It may not cover all possible information. If you have questions about this medicine, talk to your doctor, pharmacist, or health care provider.  2015, Elsevier/Gold Standard. (2007-05-17 13:53:16) Leucovorin injection What is this medicine? LEUCOVORIN (loo koe VOR in) is used to prevent or treat the harmful effects of some medicines. This medicine is used to treat anemia caused by a low amount of folic acid in the body. It is also used with 5-fluorouracil (5-FU) to treat colon cancer. This medicine may be used for other purposes; ask your health care provider or pharmacist if you have questions. What should I tell my health care provider before I take this medicine? They need to know if you have any of these conditions: -anemia from low levels of vitamin B-12 in the blood -an unusual or allergic reaction to leucovorin, folic acid, other medicines, foods, dyes, or preservatives -pregnant or trying to get pregnant -breast-feeding How should I use this medicine? This medicine is for injection into a muscle or into a vein. It is given by a health care professional in a hospital or clinic setting. Talk to your pediatrician regarding the use of this medicine in   children. Special care may be needed. Overdosage: If you think you have taken too much of this medicine contact a poison control center or emergency room at once. NOTE: This medicine is only for you. Do not share this medicine with others. What if I miss a dose? This does not apply. What  may interact with this medicine? -capecitabine -fluorouracil -phenobarbital -phenytoin -primidone -trimethoprim-sulfamethoxazole This list may not describe all possible interactions. Give your health care provider a list of all the medicines, herbs, non-prescription drugs, or dietary supplements you use. Also tell them if you smoke, drink alcohol, or use illegal drugs. Some items may interact with your medicine. What should I watch for while using this medicine? Your condition will be monitored carefully while you are receiving this medicine. This medicine may increase the side effects of 5-fluorouracil, 5-FU. Tell your doctor or health care professional if you have diarrhea or mouth sores that do not get better or that get worse. What side effects may I notice from receiving this medicine? Side effects that you should report to your doctor or health care professional as soon as possible: -allergic reactions like skin rash, itching or hives, swelling of the face, lips, or tongue -breathing problems -fever, infection -mouth sores -unusual bleeding or bruising -unusually weak or tired Side effects that usually do not require medical attention (report to your doctor or health care professional if they continue or are bothersome): -constipation or diarrhea -loss of appetite -nausea, vomiting This list may not describe all possible side effects. Call your doctor for medical advice about side effects. You may report side effects to FDA at 1-800-FDA-1088. Where should I keep my medicine? This drug is given in a hospital or clinic and will not be stored at home. NOTE: This sheet is a summary. It may not cover all possible information. If you have questions about this medicine, talk to your doctor, pharmacist, or health care provider.  2015, Elsevier/Gold Standard. (2007-07-18 16:50:29)  

## 2014-07-25 LAB — CEA: CEA: 46.4 ng/mL — AB (ref 0.0–5.0)

## 2014-07-26 ENCOUNTER — Ambulatory Visit (HOSPITAL_BASED_OUTPATIENT_CLINIC_OR_DEPARTMENT_OTHER): Payer: Medicare Other

## 2014-07-26 ENCOUNTER — Ambulatory Visit: Payer: Medicare Other

## 2014-07-26 ENCOUNTER — Other Ambulatory Visit: Payer: Medicare Other

## 2014-07-26 VITALS — BP 132/56 | HR 77 | Temp 98.0°F | Resp 18

## 2014-07-26 DIAGNOSIS — C189 Malignant neoplasm of colon, unspecified: Secondary | ICD-10-CM

## 2014-07-26 DIAGNOSIS — C184 Malignant neoplasm of transverse colon: Secondary | ICD-10-CM | POA: Diagnosis not present

## 2014-07-26 MED ORDER — HEPARIN SOD (PORK) LOCK FLUSH 100 UNIT/ML IV SOLN
500.0000 [IU] | Freq: Once | INTRAVENOUS | Status: AC | PRN
  Administered 2014-07-26: 500 [IU]
  Filled 2014-07-26: qty 5

## 2014-07-26 MED ORDER — SODIUM CHLORIDE 0.9 % IJ SOLN
10.0000 mL | INTRAMUSCULAR | Status: DC | PRN
  Administered 2014-07-26: 10 mL
  Filled 2014-07-26: qty 10

## 2014-07-26 NOTE — Progress Notes (Signed)
Per Dr. Marin Olp ok to discontinue pump early. No bolus to be given.

## 2014-08-01 ENCOUNTER — Telehealth: Payer: Self-pay | Admitting: *Deleted

## 2014-08-01 NOTE — Telephone Encounter (Signed)
Patient stating she is weak, her food is moving quickly through her system and her right nare has had some bleeding over the last few days when she blows her nose. She believes this all to be related to the Creon. Reviewed the use of Creon with the patient, and the unlikelihood that these symptoms are related to the medication. Spoke with her about expected side effects from the chemotherapy which she received last week, but she is convinced the Creon is the culprit. Reviewed with Dr Marin Olp who also doesn't believe that symptoms are related to creon, but that patient can stop the medication if she chooses. He also wants to ensure that patient is taking her lomotil. Patient is taking her lomotil as prescribed. Per her desire, she will be changing her Creon dose to experiment with medication and symptoms.

## 2014-08-06 ENCOUNTER — Other Ambulatory Visit: Payer: Self-pay | Admitting: *Deleted

## 2014-08-06 DIAGNOSIS — C189 Malignant neoplasm of colon, unspecified: Secondary | ICD-10-CM

## 2014-08-07 ENCOUNTER — Encounter: Payer: Self-pay | Admitting: Hematology & Oncology

## 2014-08-07 ENCOUNTER — Ambulatory Visit (HOSPITAL_BASED_OUTPATIENT_CLINIC_OR_DEPARTMENT_OTHER): Payer: Medicare Other | Admitting: Hematology & Oncology

## 2014-08-07 ENCOUNTER — Ambulatory Visit (HOSPITAL_BASED_OUTPATIENT_CLINIC_OR_DEPARTMENT_OTHER): Payer: Medicare Other

## 2014-08-07 ENCOUNTER — Other Ambulatory Visit (HOSPITAL_BASED_OUTPATIENT_CLINIC_OR_DEPARTMENT_OTHER): Payer: Medicare Other

## 2014-08-07 VITALS — BP 142/67 | HR 76 | Temp 97.4°F | Resp 14 | Ht 65.0 in | Wt 125.0 lb

## 2014-08-07 DIAGNOSIS — C184 Malignant neoplasm of transverse colon: Secondary | ICD-10-CM

## 2014-08-07 DIAGNOSIS — C189 Malignant neoplasm of colon, unspecified: Secondary | ICD-10-CM

## 2014-08-07 DIAGNOSIS — Z5111 Encounter for antineoplastic chemotherapy: Secondary | ICD-10-CM

## 2014-08-07 LAB — CBC WITH DIFFERENTIAL (CANCER CENTER ONLY)
BASO#: 0 10*3/uL (ref 0.0–0.2)
BASO%: 0.9 % (ref 0.0–2.0)
EOS%: 1.4 % (ref 0.0–7.0)
Eosinophils Absolute: 0.1 10*3/uL (ref 0.0–0.5)
HEMATOCRIT: 28.5 % — AB (ref 34.8–46.6)
HEMOGLOBIN: 10.1 g/dL — AB (ref 11.6–15.9)
LYMPH#: 1.4 10*3/uL (ref 0.9–3.3)
LYMPH%: 40.1 % (ref 14.0–48.0)
MCH: 36.5 pg — ABNORMAL HIGH (ref 26.0–34.0)
MCHC: 35.4 g/dL (ref 32.0–36.0)
MCV: 103 fL — AB (ref 81–101)
MONO#: 0.7 10*3/uL (ref 0.1–0.9)
MONO%: 21.2 % — AB (ref 0.0–13.0)
NEUT#: 1.3 10*3/uL — ABNORMAL LOW (ref 1.5–6.5)
NEUT%: 36.4 % — ABNORMAL LOW (ref 39.6–80.0)
PLATELETS: 83 10*3/uL — AB (ref 145–400)
RBC: 2.77 10*6/uL — AB (ref 3.70–5.32)
RDW: 14.9 % (ref 11.1–15.7)
WBC: 3.5 10*3/uL — AB (ref 3.9–10.0)

## 2014-08-07 LAB — CMP (CANCER CENTER ONLY)
ALBUMIN: 3.6 g/dL (ref 3.3–5.5)
ALT(SGPT): 36 U/L (ref 10–47)
AST: 45 U/L — AB (ref 11–38)
Alkaline Phosphatase: 113 U/L — ABNORMAL HIGH (ref 26–84)
BILIRUBIN TOTAL: 0.8 mg/dL (ref 0.20–1.60)
BUN: 22 mg/dL (ref 7–22)
CHLORIDE: 109 meq/L — AB (ref 98–108)
CO2: 26 meq/L (ref 18–33)
CREATININE: 1 mg/dL (ref 0.6–1.2)
Calcium: 9.9 mg/dL (ref 8.0–10.3)
GLUCOSE: 95 mg/dL (ref 73–118)
Potassium: 4.1 mEq/L (ref 3.3–4.7)
SODIUM: 144 meq/L (ref 128–145)
Total Protein: 7 g/dL (ref 6.4–8.1)

## 2014-08-07 MED ORDER — OXALIPLATIN CHEMO INJECTION 100 MG/20ML
61.2000 mg/m2 | Freq: Once | INTRAVENOUS | Status: AC
  Administered 2014-08-07: 100 mg via INTRAVENOUS
  Filled 2014-08-07: qty 20

## 2014-08-07 MED ORDER — FLUOROURACIL CHEMO INJECTION 500 MG/10ML
288.0000 mg/m2 | Freq: Once | INTRAVENOUS | Status: AC
  Administered 2014-08-07: 450 mg via INTRAVENOUS
  Filled 2014-08-07: qty 9

## 2014-08-07 MED ORDER — DEXAMETHASONE SODIUM PHOSPHATE 100 MG/10ML IJ SOLN
Freq: Once | INTRAMUSCULAR | Status: AC
  Administered 2014-08-07: 09:00:00 via INTRAVENOUS
  Filled 2014-08-07: qty 4

## 2014-08-07 MED ORDER — SODIUM CHLORIDE 0.9 % IV SOLN
1620.0000 mg/m2 | INTRAVENOUS | Status: DC
  Administered 2014-08-07: 2650 mg via INTRAVENOUS
  Filled 2014-08-07: qty 53

## 2014-08-07 MED ORDER — DEXTROSE 5 % IV SOLN
Freq: Once | INTRAVENOUS | Status: AC
  Administered 2014-08-07: 09:00:00 via INTRAVENOUS

## 2014-08-07 MED ORDER — LEUCOVORIN CALCIUM INJECTION 350 MG
400.0000 mg/m2 | Freq: Once | INTRAVENOUS | Status: AC
  Administered 2014-08-07: 652 mg via INTRAVENOUS
  Filled 2014-08-07: qty 32.6

## 2014-08-07 NOTE — Patient Instructions (Signed)
[facility] Discharge Instructions for Patients Receiving Chemotherapy  Today you received the following chemotherapy agents Oxaliplatin, Leucovorin and 5FU.  To help prevent nausea and vomiting after your treatment, we encourage you to take your nausea medication.   If you develop nausea and vomiting that is not controlled by your nausea medication, call the clinic.   BELOW ARE SYMPTOMS THAT SHOULD BE REPORTED IMMEDIATELY:  *FEVER GREATER THAN 100.5 F  *CHILLS WITH OR WITHOUT FEVER  NAUSEA AND VOMITING THAT IS NOT CONTROLLED WITH YOUR NAUSEA MEDICATION  *UNUSUAL SHORTNESS OF BREATH  *UNUSUAL BRUISING OR BLEEDING  TENDERNESS IN MOUTH AND THROAT WITH OR WITHOUT PRESENCE OF ULCERS  *URINARY PROBLEMS  *BOWEL PROBLEMS  UNUSUAL RASH Items with * indicate a potential emergency and should be followed up as soon as possible.  Feel free to call the clinic you have any questions or concerns. The clinic phone number is [phone].  Please show the CHEMO ALERT CARD at check-in to the Emergency Department and triage nurse.   

## 2014-08-07 NOTE — Progress Notes (Signed)
Hematology and Oncology Follow Up Visit  Tamara [name] [id number] 10/10/1929 81 y.o. 08/07/2014   Principle Diagnosis:   Recurrent/metastatic colon cancer  Current Therapy:    Patient to start cycle 2 of FOLFOX     Interim History:  Ms. Loughmiller is back for follow-up. She sees been doing okay. She is losing summer her hair. This really is bothering her. As such, I think we will have to dose adjust her chemotherapy a little bit more.  She is doing better with respect to the colostomy. She is not having issues with pain. She's having no problems with bleeding.  She's had no mouth sores.  She's had no vomiting. There's been some nausea. However, she sees the eating a little bit better.  Thankfully, her CEA started to come down. Her last CEA was 46.   Overall, her performance status is ECOG 1-2.   Medications:  Current outpatient prescriptions:  .  Alum & Mag Hydroxide-Simeth (MAGIC MOUTHWASH) SOLN, Take 5 mLs by mouth 3 (three) times daily as needed for mouth pain (swish and spit)., Disp: 240 mL, Rfl: 2 .  B Complex-C (SUPER B COMPLEX PO), Take 1 tablet by mouth every other day. Alternating days with B complex, Disp: , Rfl:  .  Biotin 5 MG TABS, Take 1 tablet by mouth every morning. , Disp: , Rfl:  .  Calcium-Magnesium-Vitamin D (CALCIUM MAGNESIUM PO), Take 1 tablet by mouth daily., Disp: , Rfl:  .  Cholecalciferol (VITAMIN D) 2000 UNITS tablet, Take 2,000 Units by mouth daily with breakfast., Disp: , Rfl:  .  dexamethasone (DECADRON) 4 MG tablet, Take 2 tablets (8 mg total) by mouth 2 (two) times daily with a meal. Start the day after chemotherapy for 2 days. Take with food., Disp: 30 tablet, Rfl: 1 .  diphenoxylate-atropine (LOMOTIL) 2.5-0.025 MG per tablet, Take 1 tablet by mouth 2 (two) times daily. Take one tablet by mouth once daily, Disp: , Rfl:  .  feeding supplement, ENSURE COMPLETE, (ENSURE COMPLETE) LIQD, Take 237 mLs by mouth 2 (two) times daily between meals.  *Vanilla*, Disp: , Rfl:  .  lidocaine-prilocaine (EMLA) cream, Apply 1 application topically as needed. Apply to port site one hour before access, Disp: 30 g, Rfl: 0 .  loperamide (IMODIUM) 2 MG capsule, Take 1 capsule (2 mg total) by mouth as needed for diarrhea or loose stools., Disp: 30 capsule, Rfl: 0 .  Multiple Vitamin (MULTIVITAMIN WITH MINERALS) TABS tablet, Take 1 tablet by mouth daily., Disp: , Rfl:  .  Omega 3 1000 MG CAPS, Take 1 capsule by mouth daily with breakfast. , Disp: , Rfl:  .  ondansetron (ZOFRAN) 8 MG tablet, Take 1 tablet (8 mg total) by mouth 2 (two) times daily. Start the day after chemo for 2 days. Then take as needed for nausea or vomiting., Disp: 30 tablet, Rfl: 1 .  Probiotic Product (ALIGN PO), Take 1 capsule by mouth daily with breakfast. , Disp: , Rfl:  .  prochlorperazine (COMPAZINE) 10 MG tablet, Take 1 tablet (10 mg total) by mouth every 6 (six) hours as needed (Nausea or vomiting)., Disp: 30 tablet, Rfl: 1 .  saccharomyces boulardii (FLORASTOR) 250 MG capsule, Take 1 capsule (250 mg total) by mouth 2 (two) times daily., Disp: 20 capsule, Rfl: 0 .  UNABLE TO FIND, Take by mouth every morning. OIL OF PRIMROSE, Disp: , Rfl:  No current facility-administered medications for this visit.  Facility-Administered Medications Ordered in Other Visits:  .  fluorouracil (  ADRUCIL) 2,650 mg in sodium chloride 0.9 % 97 mL chemo infusion, 1,620 mg/m2 (Treatment Plan Actual), Intravenous, 1 day or 1 dose, Volanda Napoleon, MD .  fluorouracil (ADRUCIL) chemo injection 450 mg, 288 mg/m2 (Treatment Plan Actual), Intravenous, Once, Volanda Napoleon, MD .  leucovorin 652 mg in dextrose 5 % 250 mL infusion, 400 mg/m2 (Treatment Plan Actual), Intravenous, Once, Volanda Napoleon, MD .  oxaliplatin (ELOXATIN) 100 mg in dextrose 5 % 500 mL chemo infusion, 61.2 mg/m2 (Treatment Plan Actual), Intravenous, Once, Volanda Napoleon, MD  Allergies:  Allergies  Allergen Reactions  . Codeine Nausea  And Vomiting  . Zofran [Ondansetron Hcl] Nausea And Vomiting  . Ciprofloxacin     Confusion/dizziness/fatigue  . Metronidazole     Confusion/dizziness/fatigue  . Skelaxin [Metaxalone]     Too strong  . Tetracycline Hcl     Not tolerated  . Avelox [Moxifloxacin] Rash    Past Medical History, Surgical history, Social history, and Family History were reviewed and updated.  Review of Systems: As above  Physical Exam:  height is 5\' 5"  (1.651 m) and weight is 125 lb (56.7 kg). Her oral temperature is 97.4 F (36.3 C). Her blood pressure is 142/67 and her pulse is 76. Her respiration is 14.   Wt Readings from Last 3 Encounters:  08/07/14 125 lb (56.7 kg)  07/24/14 126 lb 2 oz (57.21 kg)  07/19/14 122 lb (55.339 kg)     Well-developed and well-nourished elderly female in no obvious distress. Head and neck exam shows no ocular or oral lesions. There is no palpable cervical or supraclavicular lymph nodes. Lungs are clear. Cardiac exam regular rate and rhythm with no murmurs, rubs or bruits. Abdomen is soft. She has good bowel sounds. There is no fluid wave. There is no palpable liver or spleen tip. There is no obvious abdominal mass. There is no guarding or rebound tenderness. Extremities shows no clubbing, cyanosis or edema. Neurological exam shows no focal neurological deficits. Skin exam shows no rashes, ecchymoses or petechia.  Lab Results  Component Value Date   WBC 3.5* 08/07/2014   HGB 10.1* 08/07/2014   HCT 28.5* 08/07/2014   MCV 103* 08/07/2014   PLT 83* 08/07/2014     Chemistry      Component Value Date/Time   NA 144 08/07/2014 0817   NA 140 05/16/2014 1105   NA 130* 02/25/2013 0835   K 4.1 08/07/2014 0817   K 5.8* 05/16/2014 1105   K 5.6* 02/25/2013 0835   CL 109* 08/07/2014 0817   CL 110 05/16/2014 1105   CL 102 02/25/2013 0835   CO2 26 08/07/2014 0817   CO2 25 05/16/2014 1105   CO2 25 02/25/2013 0835   BUN 22 08/07/2014 0817   BUN 24* 05/16/2014 1105   BUN  32* 02/25/2013 0835   CREATININE 1.0 08/07/2014 0817   CREATININE 1.04 05/16/2014 1105   CREATININE 1.07 02/25/2013 0835      Component Value Date/Time   CALCIUM 9.9 08/07/2014 0817   CALCIUM 10.1 05/16/2014 1105   CALCIUM 10.1 02/25/2013 0835   ALKPHOS 113* 08/07/2014 0817   ALKPHOS 79 04/17/2014 0525   AST 45* 08/07/2014 0817   AST 30 04/17/2014 0525   ALT 36 08/07/2014 0817   ALT 32 04/17/2014 0525   BILITOT 0.80 08/07/2014 0817   BILITOT 0.7 04/17/2014 0525         Impression and Plan: Ms. Deshmukh is 81 year old white female with a history of stage  IIIc colon cancer. She had this resected back in October 2014. She had a very, very complicated and prolonged recovery and was  never in any good enough condition to take adjuvant chemotherapy.  She now has metastatic disease. Her CEA has been climbing. Her scans really have not shown any obvious site of metastatic disease.  I think that we can just use the CEA level as a guide for response area and we may want to consider scans at some point.  I am going to adjust her dose of chemotherapy down a little bit more.  Her husband has finished his radiation treatments. This is also a blessing.  I will go ahead with cycle 3 of FOLFOX today. Her blood counts are little on the lower side but her monocytes are up so I think this will allow her to tolerate treatment.  I will see her back in 2 weeks.  I talked to she and her husband for about half hour. I had talked to them earlier about FOLFOX. She wanted to try Xeloda just because it was a pill and she would be of help her husband, who is getting radiation treatments because of skin cancer.  She should be able to tolerate the infusional 5-FU. I will reduce her dose a little bit when we started off just to make sure that she tolerates it okay.  Nile Riggs, MD 7/13/20169:39 AM

## 2014-08-09 ENCOUNTER — Other Ambulatory Visit: Payer: Medicare Other

## 2014-08-09 ENCOUNTER — Ambulatory Visit (HOSPITAL_BASED_OUTPATIENT_CLINIC_OR_DEPARTMENT_OTHER): Payer: Medicare Other

## 2014-08-09 ENCOUNTER — Ambulatory Visit: Payer: Medicare Other

## 2014-08-09 VITALS — BP 169/69 | HR 87 | Temp 97.8°F | Resp 20

## 2014-08-09 DIAGNOSIS — C184 Malignant neoplasm of transverse colon: Secondary | ICD-10-CM

## 2014-08-09 DIAGNOSIS — C189 Malignant neoplasm of colon, unspecified: Secondary | ICD-10-CM

## 2014-08-09 MED ORDER — HEPARIN SOD (PORK) LOCK FLUSH 100 UNIT/ML IV SOLN
500.0000 [IU] | Freq: Once | INTRAVENOUS | Status: AC | PRN
  Administered 2014-08-09: 500 [IU]
  Filled 2014-08-09: qty 5

## 2014-08-09 MED ORDER — SODIUM CHLORIDE 0.9 % IJ SOLN
10.0000 mL | INTRAMUSCULAR | Status: DC | PRN
  Administered 2014-08-09: 10 mL
  Filled 2014-08-09: qty 10

## 2014-08-09 NOTE — Patient Instructions (Signed)

## 2014-08-12 ENCOUNTER — Inpatient Hospital Stay (HOSPITAL_BASED_OUTPATIENT_CLINIC_OR_DEPARTMENT_OTHER): Payer: Medicare Other

## 2014-08-12 ENCOUNTER — Other Ambulatory Visit (HOSPITAL_BASED_OUTPATIENT_CLINIC_OR_DEPARTMENT_OTHER): Payer: Medicare Other

## 2014-08-12 ENCOUNTER — Telehealth: Payer: Self-pay

## 2014-08-12 VITALS — BP 166/68 | HR 71 | Temp 97.8°F | Resp 18

## 2014-08-12 DIAGNOSIS — C189 Malignant neoplasm of colon, unspecified: Secondary | ICD-10-CM

## 2014-08-12 DIAGNOSIS — R197 Diarrhea, unspecified: Secondary | ICD-10-CM

## 2014-08-12 DIAGNOSIS — C184 Malignant neoplasm of transverse colon: Secondary | ICD-10-CM

## 2014-08-12 LAB — BASIC METABOLIC PANEL - CANCER CENTER ONLY
BUN: 22 mg/dL (ref 7–22)
CHLORIDE: 103 meq/L (ref 98–108)
CO2: 25 meq/L (ref 18–33)
Calcium: 9.6 mg/dL (ref 8.0–10.3)
Creat: 0.8 mg/dl (ref 0.6–1.2)
Glucose, Bld: 89 mg/dL (ref 73–118)
POTASSIUM: 4.2 meq/L (ref 3.3–4.7)
Sodium: 132 mEq/L (ref 128–145)

## 2014-08-12 MED ORDER — SODIUM CHLORIDE 0.9 % IV SOLN
Freq: Once | INTRAVENOUS | Status: AC
  Administered 2014-08-12: 10:00:00 via INTRAVENOUS

## 2014-08-12 NOTE — Progress Notes (Signed)
Hematology and Oncology Follow Up Visit  Tamara [name] [id number] Jun 25, 1929 81 y.o. 08/12/2014   Principle Diagnosis:   Recurrent/metastatic colon cancer  Current Therapy:    Patient s/p cycle 3 of FOLFOX     Interim History:  Tamara [name] is back for a scheduled visit. She had treatment last week. She has some problems with diarrhea through the colostomy. This was over the weekend. P Reck she spoke to her pharmacist. She was given some Lomotil. I then we had prescribed this for her leg earlier. She also is worried about hair loss. Her pharmacist told her to stop the Crestor. I think this was a good idea.  I did check some labs on her today. The labs looked good. She is not hypokalemic.  I told her what kind of food that she could eat. I told her to avoid beef and pork. I told her she cannot have any fatty or greasy food. I told her that Posta, rice, oatmeal, fish, chicken would be the best for her..  Medications:  Current outpatient prescriptions:  .  Alum & Mag Hydroxide-Simeth (MAGIC MOUTHWASH) SOLN, Take 5 mLs by mouth 3 (three) times daily as needed for mouth pain (swish and spit)., Disp: 240 mL, Rfl: 2 .  B Complex-C (SUPER B COMPLEX PO), Take 1 tablet by mouth every other day. Alternating days with B complex, Disp: , Rfl:  .  Biotin 5 MG TABS, Take 1 tablet by mouth every morning. , Disp: , Rfl:  .  Calcium-Magnesium-Vitamin D (CALCIUM MAGNESIUM PO), Take 1 tablet by mouth daily., Disp: , Rfl:  .  Cholecalciferol (VITAMIN D) 2000 UNITS tablet, Take 2,000 Units by mouth daily with breakfast., Disp: , Rfl:  .  CREON 36000 UNITS CPEP capsule, Pt states she is not taking., Disp: , Rfl: 0 .  dexamethasone (DECADRON) 4 MG tablet, Take 2 tablets (8 mg total) by mouth 2 (two) times daily with a meal. Start the day after chemotherapy for 2 days. Take with food., Disp: 30 tablet, Rfl: 1 .  diphenoxylate-atropine (LOMOTIL) 2.5-0.025 MG per tablet, Take 1 tablet by mouth 2 (two) times  daily. Take one tablet by mouth once daily, Disp: , Rfl:  .  feeding supplement, ENSURE COMPLETE, (ENSURE COMPLETE) LIQD, Take 237 mLs by mouth 2 (two) times daily between meals. *Vanilla*, Disp: , Rfl:  .  lidocaine-prilocaine (EMLA) cream, Apply 1 application topically as needed. Apply to port site one hour before access, Disp: 30 g, Rfl: 0 .  loperamide (IMODIUM) 2 MG capsule, Take 1 capsule (2 mg total) by mouth as needed for diarrhea or loose stools., Disp: 30 capsule, Rfl: 0 .  Multiple Vitamin (MULTIVITAMIN WITH MINERALS) TABS tablet, Take 1 tablet by mouth daily., Disp: , Rfl:  .  Omega 3 1000 MG CAPS, Take 1 capsule by mouth daily with breakfast. , Disp: , Rfl:  .  ondansetron (ZOFRAN) 8 MG tablet, Take 1 tablet (8 mg total) by mouth 2 (two) times daily. Start the day after chemo for 2 days. Then take as needed for nausea or vomiting., Disp: 30 tablet, Rfl: 1 .  Probiotic Product (ALIGN PO), Take 1 capsule by mouth daily with breakfast. , Disp: , Rfl:  .  prochlorperazine (COMPAZINE) 10 MG tablet, Take 1 tablet (10 mg total) by mouth every 6 (six) hours as needed (Nausea or vomiting)., Disp: 30 tablet, Rfl: 1 .  saccharomyces boulardii (FLORASTOR) 250 MG capsule, Take 1 capsule (250 mg total) by mouth 2 (two)  times daily., Disp: 20 capsule, Rfl: 0 .  UNABLE TO FIND, Take by mouth every morning. OIL OF PRIMROSE, Disp: , Rfl:   Allergies:  Allergies  Allergen Reactions  . Codeine Nausea And Vomiting  . Zofran [Ondansetron Hcl] Nausea And Vomiting  . Ciprofloxacin     Confusion/dizziness/fatigue  . Metronidazole     Confusion/dizziness/fatigue  . Skelaxin [Metaxalone]     Too strong  . Tetracycline Hcl     Not tolerated  . Avelox [Moxifloxacin] Rash    Past Medical History, Surgical history, Social history, and Family History were reviewed and updated.  Review of Systems: As above  Physical Exam:  oral temperature is 97.8 F (36.6 C). Her blood pressure is 166/68 and her  pulse is 71. Her respiration is 18.   Wt Readings from Last 3 Encounters:  08/07/14 125 lb (56.7 kg)  07/24/14 126 lb 2 oz (57.21 kg)  07/19/14 122 lb (55.339 kg)     Well-developed and well-nourished elderly female in no obvious distress. Head and neck exam shows no ocular or oral lesions. There is no palpable cervical or supraclavicular lymph nodes. Lungs are clear. Cardiac exam regular rate and rhythm with no murmurs, rubs or bruits. Abdomen is soft. She has good bowel sounds. There is no fluid wave. There is no palpable liver or spleen tip. There is no obvious abdominal mass. There is no guarding or rebound tenderness. Extremities shows no clubbing, cyanosis or edema. Neurological exam shows no focal neurological deficits. Skin exam shows no rashes, ecchymoses or petechia.  Lab Results  Component Value Date   WBC 3.5* 08/07/2014   HGB 10.1* 08/07/2014   HCT 28.5* 08/07/2014   MCV 103* 08/07/2014   PLT 83* 08/07/2014     Chemistry      Component Value Date/Time   NA 132 08/12/2014 1012   NA 140 05/16/2014 1105   NA 130* 02/25/2013 0835   K 4.2 08/12/2014 1012   K 5.8* 05/16/2014 1105   K 5.6* 02/25/2013 0835   CL 103 08/12/2014 1012   CL 110 05/16/2014 1105   CL 102 02/25/2013 0835   CO2 25 08/12/2014 1012   CO2 25 05/16/2014 1105   CO2 25 02/25/2013 0835   BUN 22 08/12/2014 1012   BUN 24* 05/16/2014 1105   BUN 32* 02/25/2013 0835   CREATININE 0.8 08/12/2014 1012   CREATININE 1.04 05/16/2014 1105   CREATININE 1.07 02/25/2013 0835      Component Value Date/Time   CALCIUM 9.6 08/12/2014 1012   CALCIUM 10.1 05/16/2014 1105   CALCIUM 10.1 02/25/2013 0835   ALKPHOS 113* 08/07/2014 0817   ALKPHOS 79 04/17/2014 0525   AST 45* 08/07/2014 0817   AST 30 04/17/2014 0525   ALT 36 08/07/2014 0817   ALT 32 04/17/2014 0525   BILITOT 0.80 08/07/2014 0817   BILITOT 0.7 04/17/2014 0525         Impression and Plan: Tamara [name] is 81 year old white female with a history of  stage IIIc colon cancer. She had this resected back in October 2014. She had a very, very complicated and prolonged recovery and was  never in any good enough condition to take adjuvant chemotherapy.  She now has metastatic disease. Her CEA has been climbing. Her scans really have not shown any obvious site of metastatic disease.  Hopefully, the IV fluids that she got today will help her. Again, she did not look all that bad when I saw her.  Her husband was  with her.  I talked to she and her husband for about 20 minutes. She understands what she can do to help with the diarrhea.  Her CEA which rechecked last week is still pending.  We will plan to see her back next week for her regular appointment. If she has any problems between now and then, we will see her sooner.   Nile Riggs, MD 7/18/20161:39 PM

## 2014-08-12 NOTE — Addendum Note (Signed)
Addended by: Burney Gauze R on: 08/12/2014 01:45 PM   Modules accepted: Level of Service

## 2014-08-12 NOTE — Patient Instructions (Addendum)

## 2014-08-12 NOTE — Telephone Encounter (Signed)
Received call from pt reporting she has had diarrhea "all weekend and I am so weak and dehydrated I need to get in there." Further questioning reveals that pt has taken 2 doses of Lomotil (last pm and once this am). Unable to determine how many episodes of diarrhea she had yesterday, but 3 this morning since waking. Pt is drinking gatorade and eating normally.  Per Dr Marin Olp, pt to come in for IVF & lab work today. Pt aware. dph

## 2014-08-15 ENCOUNTER — Telehealth: Payer: Self-pay | Admitting: *Deleted

## 2014-08-15 NOTE — Telephone Encounter (Signed)
Patient c/o her food and liquids going straight through her. She states that shortly after she eats or drinks, she needs to empty her colostomy. She denies any other symptoms like nausea or vomiting. She states she is drinking plenty of fluids including water and gatorade. She is taking her lomotil, one pill twice a day.  Spoke with Dr Marin Olp who wants patient to increase her lomotil to 2 pills, four times a day. He also suggests she try Kaopectate.   Reviewed all these instructions with patient. She read back instructions and is in understanding.

## 2014-08-21 ENCOUNTER — Encounter: Payer: Self-pay | Admitting: Hematology & Oncology

## 2014-08-21 ENCOUNTER — Other Ambulatory Visit: Payer: Self-pay | Admitting: *Deleted

## 2014-08-21 ENCOUNTER — Ambulatory Visit (HOSPITAL_BASED_OUTPATIENT_CLINIC_OR_DEPARTMENT_OTHER): Payer: Medicare Other

## 2014-08-21 ENCOUNTER — Other Ambulatory Visit (HOSPITAL_BASED_OUTPATIENT_CLINIC_OR_DEPARTMENT_OTHER): Payer: Medicare Other

## 2014-08-21 ENCOUNTER — Ambulatory Visit (HOSPITAL_BASED_OUTPATIENT_CLINIC_OR_DEPARTMENT_OTHER): Payer: Medicare Other | Admitting: Hematology & Oncology

## 2014-08-21 VITALS — BP 135/58 | HR 79 | Temp 97.7°F | Resp 14

## 2014-08-21 DIAGNOSIS — C184 Malignant neoplasm of transverse colon: Secondary | ICD-10-CM

## 2014-08-21 DIAGNOSIS — C801 Malignant (primary) neoplasm, unspecified: Secondary | ICD-10-CM

## 2014-08-21 DIAGNOSIS — C189 Malignant neoplasm of colon, unspecified: Secondary | ICD-10-CM

## 2014-08-21 DIAGNOSIS — R197 Diarrhea, unspecified: Secondary | ICD-10-CM | POA: Diagnosis not present

## 2014-08-21 DIAGNOSIS — R970 Elevated carcinoembryonic antigen [CEA]: Secondary | ICD-10-CM | POA: Diagnosis not present

## 2014-08-21 LAB — CMP (CANCER CENTER ONLY)
ALT(SGPT): 41 U/L (ref 10–47)
AST: 47 U/L — ABNORMAL HIGH (ref 11–38)
Albumin: 3.6 g/dL (ref 3.3–5.5)
Alkaline Phosphatase: 107 U/L — ABNORMAL HIGH (ref 26–84)
BILIRUBIN TOTAL: 1 mg/dL (ref 0.20–1.60)
BUN, Bld: 22 mg/dL (ref 7–22)
CALCIUM: 10.3 mg/dL (ref 8.0–10.3)
CO2: 23 meq/L (ref 18–33)
CREATININE: 1.1 mg/dL (ref 0.6–1.2)
Chloride: 109 mEq/L — ABNORMAL HIGH (ref 98–108)
Glucose, Bld: 101 mg/dL (ref 73–118)
POTASSIUM: 4 meq/L (ref 3.3–4.7)
Sodium: 144 mEq/L (ref 128–145)
Total Protein: 7.1 g/dL (ref 6.4–8.1)

## 2014-08-21 LAB — CBC WITH DIFFERENTIAL (CANCER CENTER ONLY)
BASO#: 0 10*3/uL (ref 0.0–0.2)
BASO%: 0.9 % (ref 0.0–2.0)
EOS%: 0.9 % (ref 0.0–7.0)
Eosinophils Absolute: 0 10*3/uL (ref 0.0–0.5)
HEMATOCRIT: 27.7 % — AB (ref 34.8–46.6)
HEMOGLOBIN: 9.8 g/dL — AB (ref 11.6–15.9)
LYMPH#: 1.1 10*3/uL (ref 0.9–3.3)
LYMPH%: 22.9 % (ref 14.0–48.0)
MCH: 37 pg — AB (ref 26.0–34.0)
MCHC: 35.4 g/dL (ref 32.0–36.0)
MCV: 105 fL — ABNORMAL HIGH (ref 81–101)
MONO#: 1 10*3/uL — ABNORMAL HIGH (ref 0.1–0.9)
MONO%: 21.2 % — ABNORMAL HIGH (ref 0.0–13.0)
NEUT#: 2.5 10*3/uL (ref 1.5–6.5)
NEUT%: 54.1 % (ref 39.6–80.0)
PLATELETS: 79 10*3/uL — AB (ref 145–400)
RBC: 2.65 10*6/uL — ABNORMAL LOW (ref 3.70–5.32)
RDW: 16.8 % — ABNORMAL HIGH (ref 11.1–15.7)
WBC: 4.7 10*3/uL (ref 3.9–10.0)

## 2014-08-21 MED ORDER — HEPARIN SOD (PORK) LOCK FLUSH 100 UNIT/ML IV SOLN
500.0000 [IU] | Freq: Once | INTRAVENOUS | Status: AC
  Administered 2014-08-21: 500 [IU] via INTRAVENOUS
  Filled 2014-08-21: qty 5

## 2014-08-21 MED ORDER — SODIUM CHLORIDE 0.9 % IV SOLN
INTRAVENOUS | Status: AC
  Administered 2014-08-21: 11:00:00 via INTRAVENOUS

## 2014-08-21 MED ORDER — SODIUM CHLORIDE 0.9 % IJ SOLN
10.0000 mL | INTRAMUSCULAR | Status: DC | PRN
  Administered 2014-08-21: 10 mL via INTRAVENOUS
  Filled 2014-08-21: qty 10

## 2014-08-21 MED ORDER — DIPHENOXYLATE-ATROPINE 2.5-0.025 MG PO TABS: 2.0000 | ORAL_TABLET | Freq: Four times a day (QID) | ORAL | Status: DC

## 2014-08-21 NOTE — Progress Notes (Signed)
Hematology and Oncology Follow Up Visit  Tamara [name] [id number] 08-02-1929 81 y.o. 08/21/2014   Principle Diagnosis:   Recurrent/metastatic colon cancer  Current Therapy:    Patient s/p cycle 3 of FOLFOX - chemotherapy to be stopped due to side effects     Interim History:  Tamara [name] is back for a scheduled visit. Despite our dose reductions, she still is having side effects with a lot of diarrhea. She has a colostomy. She said that she is changing the colostomy bag several times a day.  I think her quality of life really is being compromised by treatment. I'm not sure how much we really are helping her. It is not like she has a large burden of tumor that we are trying to treat. As such, I really think we have to hold off on treatment for her so she can regain some quality of life.  She is losing some hair. Again I think this is part of the chemotherapy and just the side effects.  I talked to she and her husband at length. I told him why I thought we had to hold off on treatment for right now. She is in agreement.  I would like to think that things will begin to improve within a couple weeks.  Her last CEA was a little bit better. He was down to 46. He'll be interesting to see what it is now.  Overall, her performance status is ECOG 2.  Medications:  Current outpatient prescriptions:  .  Alum & Mag Hydroxide-Simeth (MAGIC MOUTHWASH) SOLN, Take 5 mLs by mouth 3 (three) times daily as needed for mouth pain (swish and spit)., Disp: 240 mL, Rfl: 2 .  B Complex-C (SUPER B COMPLEX PO), Take 1 tablet by mouth every other day. Alternating days with B complex, Disp: , Rfl:  .  Biotin 5 MG TABS, Take 1 tablet by mouth every morning. , Disp: , Rfl:  .  Calcium-Magnesium-Vitamin D (CALCIUM MAGNESIUM PO), Take 1 tablet by mouth daily., Disp: , Rfl:  .  Cholecalciferol (VITAMIN D) 2000 UNITS tablet, Take 2,000 Units by mouth daily with breakfast., Disp: , Rfl:  .  dexamethasone (DECADRON)  4 MG tablet, Take 2 tablets (8 mg total) by mouth 2 (two) times daily with a meal. Start the day after chemotherapy for 2 days. Take with food., Disp: 30 tablet, Rfl: 1 .  feeding supplement, ENSURE COMPLETE, (ENSURE COMPLETE) LIQD, Take 237 mLs by mouth 2 (two) times daily between meals. *Vanilla*, Disp: , Rfl:  .  lidocaine-prilocaine (EMLA) cream, Apply 1 application topically as needed. Apply to port site one hour before access, Disp: 30 g, Rfl: 0 .  Multiple Vitamin (MULTIVITAMIN WITH MINERALS) TABS tablet, Take 1 tablet by mouth daily., Disp: , Rfl:  .  Omega 3 1000 MG CAPS, Take 1 capsule by mouth daily with breakfast. , Disp: , Rfl:  .  prochlorperazine (COMPAZINE) 10 MG tablet, Take 1 tablet (10 mg total) by mouth every 6 (six) hours as needed (Nausea or vomiting)., Disp: 30 tablet, Rfl: 1 .  saccharomyces boulardii (FLORASTOR) 250 MG capsule, Take 1 capsule (250 mg total) by mouth 2 (two) times daily., Disp: 20 capsule, Rfl: 0 .  diphenoxylate-atropine (LOMOTIL) 2.5-0.025 MG per tablet, Take 2 tablets by mouth 4 (four) times daily. Take one tablet by mouth once daily, Disp: 240 tablet, Rfl: 6 .  Probiotic Product (ALIGN PO), Take 1 capsule by mouth daily with breakfast. , Disp: , Rfl:  .  UNABLE TO FIND, Take by mouth every morning. OIL OF PRIMROSE, Disp: , Rfl:   Allergies:  Allergies  Allergen Reactions  . Codeine Nausea And Vomiting  . Zofran [Ondansetron Hcl] Nausea And Vomiting  . Ciprofloxacin     Confusion/dizziness/fatigue  . Metronidazole     Confusion/dizziness/fatigue  . Skelaxin [Metaxalone]     Too strong  . Tetracycline Hcl     Not tolerated  . Avelox [Moxifloxacin] Rash    Past Medical History, Surgical history, Social history, and Family History were reviewed and updated.  Review of Systems: As above  Physical Exam:  oral temperature is 97.7 F (36.5 C). Her blood pressure is 135/58 and her pulse is 79. Her respiration is 14.   Wt Readings from Last 3  Encounters:  08/07/14 125 lb (56.7 kg)  07/24/14 126 lb 2 oz (57.21 kg)  07/19/14 122 lb (55.339 kg)     Well-developed and well-nourished elderly female in no obvious distress. Head and neck exam shows no ocular or oral lesions. There is no palpable cervical or supraclavicular lymph nodes. Lungs are clear. Cardiac exam regular rate and rhythm with no murmurs, rubs or bruits. Abdomen is soft. She has good bowel sounds. There is no fluid wave. There is no palpable liver or spleen tip. There is no obvious abdominal mass. There is no guarding or rebound tenderness. Extremities shows no clubbing, cyanosis or edema. Neurological exam shows no focal neurological deficits. Skin exam shows no rashes, ecchymoses or petechia.  Lab Results  Component Value Date   WBC 4.7 08/21/2014   HGB 9.8* 08/21/2014   HCT 27.7* 08/21/2014   MCV 105* 08/21/2014   PLT 79* 08/21/2014     Chemistry      Component Value Date/Time   NA 144 08/21/2014 0847   NA 140 05/16/2014 1105   NA 130* 02/25/2013 0835   K 4.0 08/21/2014 0847   K 5.8* 05/16/2014 1105   K 5.6* 02/25/2013 0835   CL 109* 08/21/2014 0847   CL 110 05/16/2014 1105   CL 102 02/25/2013 0835   CO2 23 08/21/2014 0847   CO2 25 05/16/2014 1105   CO2 25 02/25/2013 0835   BUN 22 08/21/2014 0847   BUN 24* 05/16/2014 1105   BUN 32* 02/25/2013 0835   CREATININE 1.1 08/21/2014 0847   CREATININE 1.04 05/16/2014 1105   CREATININE 1.07 02/25/2013 0835      Component Value Date/Time   CALCIUM 10.3 08/21/2014 0847   CALCIUM 10.1 05/16/2014 1105   CALCIUM 10.1 02/25/2013 0835   ALKPHOS 107* 08/21/2014 0847   ALKPHOS 79 04/17/2014 0525   AST 47* 08/21/2014 0847   AST 30 04/17/2014 0525   ALT 41 08/21/2014 0847   ALT 32 04/17/2014 0525   BILITOT 1.00 08/21/2014 0847   BILITOT 0.7 04/17/2014 0525         Impression and Plan: Tamara [name] is 81 year old white female with a history of stage IIIc colon cancer. She had this resected back in October  2014. She had a very, very complicated and prolonged recovery and was  never in any good enough condition to take adjuvant chemotherapy.  She now has metastatic disease. Her CEA has been climbing. Her scans really have not shown any obvious site of metastatic disease.  We will go ahead and give her some IV fluids today. I think this will help her out a little bit.  I tried her on Creon which she said made her lose her hair. I  tried to convince her that this was not the case but I was not successful. I do think Creon would be helpful to get her to absorb better and to have decreased diarrhea.  For now, I think we can just follow [name]. I think we can see how she does. I think her CEA will be a good measure for [place].  At some point in the future, I'm sure that we will have to rescan her to see how things look.  I just want her quality of life to be better so she can enjoy herself and help take care of her husband who has his own set of health issues. will see her sooner.   Nile Riggs, MD 7/27/20163:52 PM

## 2014-08-21 NOTE — Patient Instructions (Signed)
Dehydration, Adult Dehydration is when you lose more fluids from the body than you take in. Vital organs like the kidneys, brain, and heart cannot function without a proper amount of fluids and salt. Any loss of fluids from the body can cause dehydration.  CAUSES   Vomiting.  Diarrhea.  Excessive sweating.  Excessive urine output.  Fever. SYMPTOMS  Mild dehydration  Thirst.  Dry lips.  Slightly dry mouth. Moderate dehydration  Very dry mouth.  Sunken eyes.  Skin does not bounce back quickly when lightly pinched and released.  Dark urine and decreased urine production.  Decreased tear production.  Headache. Severe dehydration  Very dry mouth.  Extreme thirst.  Rapid, weak pulse (more than 100 beats per minute at rest).  Cold hands and feet.  Not able to sweat in spite of heat and temperature.  Rapid breathing.  Blue lips.  Confusion and lethargy.  Difficulty being awakened.  Minimal urine production.  No tears. DIAGNOSIS  Your caregiver will diagnose dehydration based on your symptoms and your exam. Blood and urine tests will help confirm the diagnosis. The diagnostic evaluation should also identify the cause of dehydration. TREATMENT  Treatment of mild or moderate dehydration can often be done at home by increasing the amount of fluids that you drink. It is best to drink small amounts of fluid more often. Drinking too much at one time can make vomiting worse. Refer to the home care instructions below. Severe dehydration needs to be treated at the hospital where you will probably be given intravenous (IV) fluids that contain water and electrolytes. HOME CARE INSTRUCTIONS   Ask your caregiver about specific rehydration instructions.  Drink enough fluids to keep your urine clear or pale yellow.  Drink small amounts frequently if you have nausea and vomiting.  Eat as you normally do.  Avoid:  Foods or drinks high in sugar.  Carbonated  drinks.  Juice.  Extremely hot or cold fluids.  Drinks with caffeine.  Fatty, greasy foods.  Alcohol.  Tobacco.  Overeating.  Gelatin desserts.  Wash your hands well to avoid spreading bacteria and viruses.  Only take over-the-counter or prescription medicines for pain, discomfort, or fever as directed by your caregiver.  Ask your caregiver if you should continue all prescribed and over-the-counter medicines.  Keep all follow-up appointments with your caregiver. SEEK MEDICAL CARE IF:  You have abdominal pain and it increases or stays in one area (localizes).  You have a rash, stiff neck, or severe headache.  You are irritable, sleepy, or difficult to awaken.  You are weak, dizzy, or extremely thirsty. SEEK IMMEDIATE MEDICAL CARE IF:   You are unable to keep fluids down or you get worse despite treatment.  You have frequent episodes of vomiting or diarrhea.  You have blood or green matter (bile) in your vomit.  You have blood in your stool or your stool looks black and tarry.  You have not urinated in 6 to 8 hours, or you have only urinated a small amount of very dark urine.  You have a fever.  You faint. MAKE SURE YOU:   Understand these instructions.  Will watch your condition.  Will get help right away if you are not doing well or get worse. Document Released: 01/11/2005 Document Revised: 04/05/2011 Document Reviewed: 08/31/2010 Exit[employer] Patient Information 2015 [employer]. This information is not intended to replace advice given to you by your health care provider. Make sure you discuss any questions you have with your health care   provider.  

## 2014-08-22 LAB — CEA: CEA: 23.5 ng/mL — AB (ref 0.0–5.0)

## 2014-08-22 LAB — LACTATE DEHYDROGENASE: LDH: 191 U/L (ref 94–250)

## 2014-08-23 ENCOUNTER — Ambulatory Visit: Payer: Medicare Other

## 2014-08-23 ENCOUNTER — Other Ambulatory Visit: Payer: Medicare Other

## 2014-08-23 DIAGNOSIS — D500 Iron deficiency anemia secondary to blood loss (chronic): Secondary | ICD-10-CM | POA: Diagnosis not present

## 2014-08-23 DIAGNOSIS — I10 Essential (primary) hypertension: Secondary | ICD-10-CM | POA: Diagnosis not present

## 2014-08-23 DIAGNOSIS — C50919 Malignant neoplasm of unspecified site of unspecified female breast: Secondary | ICD-10-CM | POA: Diagnosis not present

## 2014-08-23 DIAGNOSIS — Z950 Presence of cardiac pacemaker: Secondary | ICD-10-CM | POA: Diagnosis not present

## 2014-08-23 DIAGNOSIS — C189 Malignant neoplasm of colon, unspecified: Secondary | ICD-10-CM | POA: Diagnosis not present

## 2014-09-10 ENCOUNTER — Ambulatory Visit (HOSPITAL_BASED_OUTPATIENT_CLINIC_OR_DEPARTMENT_OTHER): Payer: Medicare Other

## 2014-09-10 ENCOUNTER — Ambulatory Visit (HOSPITAL_BASED_OUTPATIENT_CLINIC_OR_DEPARTMENT_OTHER): Payer: Medicare Other | Admitting: Hematology & Oncology

## 2014-09-10 ENCOUNTER — Encounter: Payer: Self-pay | Admitting: Hematology & Oncology

## 2014-09-10 ENCOUNTER — Other Ambulatory Visit (HOSPITAL_BASED_OUTPATIENT_CLINIC_OR_DEPARTMENT_OTHER): Payer: Medicare Other

## 2014-09-10 DIAGNOSIS — C184 Malignant neoplasm of transverse colon: Secondary | ICD-10-CM

## 2014-09-10 DIAGNOSIS — R5383 Other fatigue: Secondary | ICD-10-CM

## 2014-09-10 DIAGNOSIS — C189 Malignant neoplasm of colon, unspecified: Secondary | ICD-10-CM

## 2014-09-10 LAB — CBC WITH DIFFERENTIAL (CANCER CENTER ONLY)
BASO#: 0 10*3/uL (ref 0.0–0.2)
BASO%: 0.5 % (ref 0.0–2.0)
EOS%: 1.4 % (ref 0.0–7.0)
Eosinophils Absolute: 0.1 10*3/uL (ref 0.0–0.5)
HCT: 30.3 % — ABNORMAL LOW (ref 34.8–46.6)
HGB: 10.9 g/dL — ABNORMAL LOW (ref 11.6–15.9)
LYMPH#: 1.7 10*3/uL (ref 0.9–3.3)
LYMPH%: 27.2 % (ref 14.0–48.0)
MCH: 38.7 pg — ABNORMAL HIGH (ref 26.0–34.0)
MCHC: 36 g/dL (ref 32.0–36.0)
MCV: 107 fL — AB (ref 81–101)
MONO#: 0.8 10*3/uL (ref 0.1–0.9)
MONO%: 13.1 % — ABNORMAL HIGH (ref 0.0–13.0)
NEUT#: 3.7 10*3/uL (ref 1.5–6.5)
NEUT%: 57.8 % (ref 39.6–80.0)
Platelets: 103 10*3/uL — ABNORMAL LOW (ref 145–400)
RBC: 2.82 10*6/uL — AB (ref 3.70–5.32)
RDW: 16.1 % — ABNORMAL HIGH (ref 11.1–15.7)
WBC: 6.4 10*3/uL (ref 3.9–10.0)

## 2014-09-10 LAB — CMP (CANCER CENTER ONLY)
ALBUMIN: 3.6 g/dL (ref 3.3–5.5)
ALT(SGPT): 24 U/L (ref 10–47)
AST: 38 U/L (ref 11–38)
Alkaline Phosphatase: 121 U/L — ABNORMAL HIGH (ref 26–84)
BILIRUBIN TOTAL: 0.9 mg/dL (ref 0.20–1.60)
BUN, Bld: 22 mg/dL (ref 7–22)
CO2: 24 meq/L (ref 18–33)
CREATININE: 0.6 mg/dL (ref 0.6–1.2)
Calcium: 10.1 mg/dL (ref 8.0–10.3)
Chloride: 109 mEq/L — ABNORMAL HIGH (ref 98–108)
Glucose, Bld: 100 mg/dL (ref 73–118)
Potassium: 4.6 mEq/L (ref 3.3–4.7)
SODIUM: 140 meq/L (ref 128–145)
Total Protein: 7.5 g/dL (ref 6.4–8.1)

## 2014-09-10 MED ORDER — SODIUM CHLORIDE 0.9 % IV SOLN
Freq: Once | INTRAVENOUS | Status: AC
  Administered 2014-09-10: 13:00:00 via INTRAVENOUS

## 2014-09-10 NOTE — Progress Notes (Signed)
Hematology and Oncology Follow Up Visit  Tamara [name] [id number] 04/18/1929 81 y.o. 09/10/2014   Principle Diagnosis:   Recurrent/metastatic colon cancer  Current Therapy:    Patient s/p cycle 3 of FOLFOX - chemotherapy to be stopped due to side effects     Interim History:  Tamara [name] is back for a scheduled visit.  She looks good. However, she feels tired and fatigued. I suspect that the chemotherapy is probably still in her system.   Her colostomy still is putting out liquid stool. The quantity has not been as much.    of note, her last CEA was down to 23. This has continued to come down. Even though we've not treated her for over a month, hopefully, we will still be seeing the effects of her therapy.  At some point, we're going to have to repeat another set of scans on her to see how things look.  She thinks that she is a little dehydrated. We will go ahead and give her some IV fluids today.   She's had no pain.  His been no fever.  She's had no cough or shortness of breath.   Her taste for food is improving.  Overall, her performance status is ECOG 2.  Medications:  Current outpatient prescriptions:  .  Alum & Mag Hydroxide-Simeth (MAGIC MOUTHWASH) SOLN, Take 5 mLs by mouth 3 (three) times daily as needed for mouth pain (swish and spit)., Disp: 240 mL, Rfl: 2 .  B Complex-C (SUPER B COMPLEX PO), Take 1 tablet by mouth every other day. Alternating days with B complex, Disp: , Rfl:  .  Biotin 5 MG TABS, Take 1 tablet by mouth every morning. , Disp: , Rfl:  .  Calcium-Magnesium-Vitamin D (CALCIUM MAGNESIUM PO), Take 1 tablet by mouth daily., Disp: , Rfl:  .  Cholecalciferol (VITAMIN D) 2000 UNITS tablet, Take 2,000 Units by mouth daily with breakfast., Disp: , Rfl:  .  dexamethasone (DECADRON) 4 MG tablet, Take 2 tablets (8 mg total) by mouth 2 (two) times daily with a meal. Start the day after chemotherapy for 2 days. Take with food., Disp: 30 tablet, Rfl: 1 .   diphenoxylate-atropine (LOMOTIL) 2.5-0.025 MG per tablet, Take 2 tablets by mouth 4 (four) times daily. Take one tablet by mouth once daily, Disp: 240 tablet, Rfl: 6 .  feeding supplement, ENSURE COMPLETE, (ENSURE COMPLETE) LIQD, Take 237 mLs by mouth 2 (two) times daily between meals. *Vanilla*, Disp: , Rfl:  .  lidocaine-prilocaine (EMLA) cream, Apply 1 application topically as needed. Apply to port site one hour before access, Disp: 30 g, Rfl: 0 .  Multiple Vitamin (MULTIVITAMIN WITH MINERALS) TABS tablet, Take 1 tablet by mouth daily., Disp: , Rfl:  .  Omega 3 1000 MG CAPS, Take 1 capsule by mouth daily with breakfast. , Disp: , Rfl:  .  Probiotic Product (ALIGN PO), Take 1 capsule by mouth daily with breakfast. , Disp: , Rfl:  .  prochlorperazine (COMPAZINE) 10 MG tablet, Take 1 tablet (10 mg total) by mouth every 6 (six) hours as needed (Nausea or vomiting)., Disp: 30 tablet, Rfl: 1 .  saccharomyces boulardii (FLORASTOR) 250 MG capsule, Take 1 capsule (250 mg total) by mouth 2 (two) times daily., Disp: 20 capsule, Rfl: 0 .  UNABLE TO FIND, Take by mouth every morning. OIL OF PRIMROSE, Disp: , Rfl:   Allergies:  Allergies  Allergen Reactions  . Codeine Nausea And Vomiting  . Zofran [Ondansetron Hcl] Nausea And Vomiting  .  Ciprofloxacin     Confusion/dizziness/fatigue  . Metronidazole     Confusion/dizziness/fatigue  . Skelaxin [Metaxalone]     Too strong  . Tetracycline Hcl     Not tolerated  . Avelox [Moxifloxacin] Rash    Past Medical History, Surgical history, Social history, and Family History were reviewed and updated.  Review of Systems: As above  Physical Exam:  vitals were not taken for this visit.  Wt Readings from Last 3 Encounters:  08/07/14 125 lb (56.7 kg)  07/24/14 126 lb 2 oz (57.21 kg)  07/19/14 122 lb (55.339 kg)     Well-developed and well-nourished elderly female in no obvious distress. Head and neck exam shows no ocular or oral lesions. There is no  palpable cervical or supraclavicular lymph nodes. Lungs are clear. Cardiac exam regular rate and rhythm with no murmurs, rubs or bruits. Abdomen is soft. She has good bowel sounds. There is no fluid wave. There is no palpable liver or spleen tip. There is no obvious abdominal mass. There is no guarding or rebound tenderness. Extremities shows no clubbing, cyanosis or edema. Neurological exam shows no focal neurological deficits. Skin exam shows no rashes, ecchymoses or petechia.  Lab Results  Component Value Date   WBC 6.4 09/10/2014   HGB 10.9* 09/10/2014   HCT 30.3* 09/10/2014   MCV 107* 09/10/2014   PLT 103* 09/10/2014     Chemistry      Component Value Date/Time   NA 144 08/21/2014 0847   NA 140 05/16/2014 1105   NA 130* 02/25/2013 0835   K 4.0 08/21/2014 0847   K 5.8* 05/16/2014 1105   K 5.6* 02/25/2013 0835   CL 109* 08/21/2014 0847   CL 110 05/16/2014 1105   CL 102 02/25/2013 0835   CO2 23 08/21/2014 0847   CO2 25 05/16/2014 1105   CO2 25 02/25/2013 0835   BUN 22 08/21/2014 0847   BUN 24* 05/16/2014 1105   BUN 32* 02/25/2013 0835   CREATININE 1.1 08/21/2014 0847   CREATININE 1.04 05/16/2014 1105   CREATININE 1.07 02/25/2013 0835      Component Value Date/Time   CALCIUM 10.3 08/21/2014 0847   CALCIUM 10.1 05/16/2014 1105   CALCIUM 10.1 02/25/2013 0835   ALKPHOS 107* 08/21/2014 0847   ALKPHOS 79 04/17/2014 0525   AST 47* 08/21/2014 0847   AST 30 04/17/2014 0525   ALT 41 08/21/2014 0847   ALT 32 04/17/2014 0525   BILITOT 1.00 08/21/2014 0847   BILITOT 0.7 04/17/2014 0525         Impression and Plan: Tamara [name] is 81 year old white female with a history of stage IIIc colon cancer. She had this resected back in October 2014. She had a very, very complicated and prolonged recovery and was  never in any good enough condition to take adjuvant chemotherapy.  She now has metastatic disease. Her CEA has been climbing. Her scans really have not shown any obvious site  of metastatic disease.  We will go ahead and give her some IV fluids today. I think this will help her out a little bit.  I  We will go ahead and get her set up with some CT scans we'll see her back. I think this will be very helpful for [place].  We will see with the IV fluids do and hopefully we will find that they will help her.   I would like to see her back in about a month or so.   I spent about  30 minutes with she and her husband.  Nile Riggs, MD 8/16/20161:00 PM

## 2014-09-10 NOTE — Addendum Note (Signed)
Addended by: Burney Gauze R on: 09/10/2014 01:06 PM   Modules accepted: Orders

## 2014-09-10 NOTE — Patient Instructions (Signed)
Dehydration, Adult Dehydration is when you lose more fluids from the body than you take in. Vital organs like the kidneys, brain, and heart cannot function without a proper amount of fluids and salt. Any loss of fluids from the body can cause dehydration.  CAUSES   Vomiting.  Diarrhea.  Excessive sweating.  Excessive urine output.  Fever. SYMPTOMS  Mild dehydration  Thirst.  Dry lips.  Slightly dry mouth. Moderate dehydration  Very dry mouth.  Sunken eyes.  Skin does not bounce back quickly when lightly pinched and released.  Dark urine and decreased urine production.  Decreased tear production.  Headache. Severe dehydration  Very dry mouth.  Extreme thirst.  Rapid, weak pulse (more than 100 beats per minute at rest).  Cold hands and feet.  Not able to sweat in spite of heat and temperature.  Rapid breathing.  Blue lips.  Confusion and lethargy.  Difficulty being awakened.  Minimal urine production.  No tears. DIAGNOSIS  Your caregiver will diagnose dehydration based on your symptoms and your exam. Blood and urine tests will help confirm the diagnosis. The diagnostic evaluation should also identify the cause of dehydration. TREATMENT  Treatment of mild or moderate dehydration can often be done at home by increasing the amount of fluids that you drink. It is best to drink small amounts of fluid more often. Drinking too much at one time can make vomiting worse. Refer to the home care instructions below. Severe dehydration needs to be treated at the hospital where you will probably be given intravenous (IV) fluids that contain water and electrolytes. HOME CARE INSTRUCTIONS   Ask your caregiver about specific rehydration instructions.  Drink enough fluids to keep your urine clear or pale yellow.  Drink small amounts frequently if you have nausea and vomiting.  Eat as you normally do.  Avoid:  Foods or drinks high in sugar.  Carbonated  drinks.  Juice.  Extremely hot or cold fluids.  Drinks with caffeine.  Fatty, greasy foods.  Alcohol.  Tobacco.  Overeating.  Gelatin desserts.  Wash your hands well to avoid spreading bacteria and viruses.  Only take over-the-counter or prescription medicines for pain, discomfort, or fever as directed by your caregiver.  Ask your caregiver if you should continue all prescribed and over-the-counter medicines.  Keep all follow-up appointments with your caregiver. SEEK MEDICAL CARE IF:  You have abdominal pain and it increases or stays in one area (localizes).  You have a rash, stiff neck, or severe headache.  You are irritable, sleepy, or difficult to awaken.  You are weak, dizzy, or extremely thirsty. SEEK IMMEDIATE MEDICAL CARE IF:   You are unable to keep fluids down or you get worse despite treatment.  You have frequent episodes of vomiting or diarrhea.  You have blood or green matter (bile) in your vomit.  You have blood in your stool or your stool looks black and tarry.  You have not urinated in 6 to 8 hours, or you have only urinated a small amount of very dark urine.  You have a fever.  You faint. MAKE SURE YOU:   Understand these instructions.  Will watch your condition.  Will get help right away if you are not doing well or get worse. Document Released: 01/11/2005 Document Revised: 04/05/2011 Document Reviewed: 08/31/2010 Exit[employer] Patient Information 2015 [employer]. This information is not intended to replace advice given to you by your health care provider. Make sure you discuss any questions you have with your health care   provider.  

## 2014-09-11 LAB — PREALBUMIN: Prealbumin: 26 mg/dL (ref 17–34)

## 2014-09-11 LAB — CEA: CEA: 14 ng/mL — ABNORMAL HIGH (ref 0.0–5.0)

## 2014-10-15 ENCOUNTER — Encounter (HOSPITAL_BASED_OUTPATIENT_CLINIC_OR_DEPARTMENT_OTHER): Payer: Self-pay

## 2014-10-15 ENCOUNTER — Telehealth: Payer: Self-pay | Admitting: *Deleted

## 2014-10-15 ENCOUNTER — Ambulatory Visit (HOSPITAL_BASED_OUTPATIENT_CLINIC_OR_DEPARTMENT_OTHER)
Admission: RE | Admit: 2014-10-15 | Discharge: 2014-10-15 | Disposition: A | Discharge status: Home / Self Care | Payer: Medicare Other | Source: Ambulatory Visit | Attending: Hematology & Oncology | Admitting: Hematology & Oncology

## 2014-10-15 DIAGNOSIS — C189 Malignant neoplasm of colon, unspecified: Secondary | ICD-10-CM | POA: Insufficient documentation

## 2014-10-15 DIAGNOSIS — K5730 Diverticulosis of large intestine without perforation or abscess without bleeding: Secondary | ICD-10-CM | POA: Insufficient documentation

## 2014-10-15 MED ORDER — IOHEXOL 300 MG/ML  SOLN: 100.0000 mL | Freq: Once | INTRAMUSCULAR | Status: DC | PRN

## 2014-10-15 MED ORDER — IOHEXOL 300 MG/ML  SOLN
100.0000 mL | Freq: Once | INTRAMUSCULAR | Status: AC | PRN
  Administered 2014-10-15: 100 mL via INTRAVENOUS

## 2014-10-15 NOTE — Telephone Encounter (Addendum)
Patient aware of results  ----- Message from Volanda Napoleon, MD sent at 10/15/2014 12:01 PM EDT ----- Call - No obvious cancer is seen!!  pete

## 2014-10-21 ENCOUNTER — Other Ambulatory Visit (HOSPITAL_BASED_OUTPATIENT_CLINIC_OR_DEPARTMENT_OTHER): Payer: Medicare Other

## 2014-10-21 ENCOUNTER — Ambulatory Visit (HOSPITAL_BASED_OUTPATIENT_CLINIC_OR_DEPARTMENT_OTHER): Payer: Medicare Other | Admitting: Hematology & Oncology

## 2014-10-21 ENCOUNTER — Ambulatory Visit: Payer: Medicare Other

## 2014-10-21 ENCOUNTER — Other Ambulatory Visit: Payer: Medicare Other

## 2014-10-21 ENCOUNTER — Ambulatory Visit: Payer: Medicare Other | Admitting: Hematology & Oncology

## 2014-10-21 ENCOUNTER — Encounter: Payer: Self-pay | Admitting: Hematology & Oncology

## 2014-10-21 VITALS — BP 159/63 | HR 87 | Temp 97.8°F | Resp 16 | Ht 65.0 in | Wt 128.0 lb

## 2014-10-21 DIAGNOSIS — C189 Malignant neoplasm of colon, unspecified: Secondary | ICD-10-CM

## 2014-10-21 DIAGNOSIS — C50919 Malignant neoplasm of unspecified site of unspecified female breast: Secondary | ICD-10-CM | POA: Diagnosis not present

## 2014-10-21 LAB — CBC WITH DIFFERENTIAL (CANCER CENTER ONLY)
BASO#: 0 10*3/uL (ref 0.0–0.2)
BASO%: 0.4 % (ref 0.0–2.0)
EOS ABS: 0.1 10*3/uL (ref 0.0–0.5)
EOS%: 1 % (ref 0.0–7.0)
HCT: 34.1 % — ABNORMAL LOW (ref 34.8–46.6)
HEMOGLOBIN: 11.6 g/dL (ref 11.6–15.9)
LYMPH#: 1.5 10*3/uL (ref 0.9–3.3)
LYMPH%: 29.5 % (ref 14.0–48.0)
MCH: 36.6 pg — ABNORMAL HIGH (ref 26.0–34.0)
MCHC: 34 g/dL (ref 32.0–36.0)
MCV: 108 fL — AB (ref 81–101)
MONO#: 0.6 10*3/uL (ref 0.1–0.9)
MONO%: 11.9 % (ref 0.0–13.0)
NEUT%: 57.2 % (ref 39.6–80.0)
NEUTROS ABS: 2.8 10*3/uL (ref 1.5–6.5)
Platelets: 103 10*3/uL — ABNORMAL LOW (ref 145–400)
RBC: 3.17 10*6/uL — AB (ref 3.70–5.32)
RDW: 12 % (ref 11.1–15.7)
WBC: 5 10*3/uL (ref 3.9–10.0)

## 2014-10-21 LAB — CMP (CANCER CENTER ONLY)
ALBUMIN: 3.6 g/dL (ref 3.3–5.5)
ALK PHOS: 119 U/L — AB (ref 26–84)
ALT: 27 U/L (ref 10–47)
AST: 35 U/L (ref 11–38)
BILIRUBIN TOTAL: 0.7 mg/dL (ref 0.20–1.60)
BUN, Bld: 26 mg/dL — ABNORMAL HIGH (ref 7–22)
CALCIUM: 9.5 mg/dL (ref 8.0–10.3)
CO2: 24 mEq/L (ref 18–33)
Chloride: 102 mEq/L (ref 98–108)
Creat: 1.1 mg/dl (ref 0.6–1.2)
Glucose, Bld: 115 mg/dL (ref 73–118)
Potassium: 4.5 mEq/L (ref 3.3–4.7)
Sodium: 136 mEq/L (ref 128–145)
Total Protein: 7 g/dL (ref 6.4–8.1)

## 2014-10-21 MED ORDER — HEPARIN SOD (PORK) LOCK FLUSH 100 UNIT/ML IV SOLN
500.0000 [IU] | Freq: Once | INTRAVENOUS | Status: AC
  Administered 2014-10-21: 500 [IU] via INTRAVENOUS
  Filled 2014-10-21: qty 5

## 2014-10-21 MED ORDER — SODIUM CHLORIDE 0.9 % IJ SOLN
10.0000 mL | INTRAMUSCULAR | Status: DC | PRN
  Administered 2014-10-21: 10 mL via INTRAVENOUS
  Filled 2014-10-21: qty 10

## 2014-10-21 NOTE — Patient Instructions (Signed)

## 2014-10-21 NOTE — Progress Notes (Signed)
Hematology and Oncology Follow Up Visit  Tamara [name] [id number] 1929-02-20 81 y.o. 10/21/2014   Principle Diagnosis:   Recurrent/metastatic colon cancer  Current Therapy:    Patient s/p cycle 3 of FOLFOX - chemotherapy to be stopped due to side effects     Interim History:  Tamara [name] is back for follow-up. She really looks great.  We would did go ahead and repeat a CT scan on her. The CT scan in essence, did not show any obvious active disease. Her liver looked good. Her lungs look good. There is no ascites.  Her last CEA was down to 14. When we first started her therapy, her CEA was 59.  Her quality life is doing so much better now. We did give her intermittent IV fluids. I don't leash needs any today.  She has had no problems with pain.  Her colostomy is working quite well. She seems to have tolerated this.  She's had no leg swelling.  She's had no nausea or vomiting. His been no cough or shortness of breath.   Medications:  Current outpatient prescriptions:  .  Alum & Mag Hydroxide-Simeth (MAGIC MOUTHWASH) SOLN, Take 5 mLs by mouth 3 (three) times daily as needed for mouth pain (swish and spit)., Disp: 240 mL, Rfl: 2 .  B Complex-C (SUPER B COMPLEX PO), Take 1 tablet by mouth every other day. Alternating days with B complex, Disp: , Rfl:  .  Biotin 5 MG TABS, Take 1 tablet by mouth every morning. , Disp: , Rfl:  .  Calcium-Magnesium-Vitamin D (CALCIUM MAGNESIUM PO), Take 1 tablet by mouth daily., Disp: , Rfl:  .  Cholecalciferol (VITAMIN D) 2000 UNITS tablet, Take 2,000 Units by mouth daily with breakfast., Disp: , Rfl:  .  dexamethasone (DECADRON) 4 MG tablet, Take 2 tablets (8 mg total) by mouth 2 (two) times daily with a meal. Start the day after chemotherapy for 2 days. Take with food., Disp: 30 tablet, Rfl: 1 .  diphenoxylate-atropine (LOMOTIL) 2.5-0.025 MG per tablet, Take 2 tablets by mouth 4 (four) times daily. Take one tablet by mouth once daily, Disp: 240  tablet, Rfl: 6 .  feeding supplement, ENSURE COMPLETE, (ENSURE COMPLETE) LIQD, Take 237 mLs by mouth 2 (two) times daily between meals. *Vanilla*, Disp: , Rfl:  .  lidocaine-prilocaine (EMLA) cream, Apply 1 application topically as needed. Apply to port site one hour before access, Disp: 30 g, Rfl: 0 .  Multiple Vitamin (MULTIVITAMIN WITH MINERALS) TABS tablet, Take 1 tablet by mouth daily., Disp: , Rfl:  .  Omega 3 1000 MG CAPS, Take 1 capsule by mouth daily with breakfast. , Disp: , Rfl:  .  Probiotic Product (ALIGN PO), Take 1 capsule by mouth daily with breakfast. , Disp: , Rfl:  .  prochlorperazine (COMPAZINE) 10 MG tablet, Take 1 tablet (10 mg total) by mouth every 6 (six) hours as needed (Nausea or vomiting)., Disp: 30 tablet, Rfl: 1 .  saccharomyces boulardii (FLORASTOR) 250 MG capsule, Take 1 capsule (250 mg total) by mouth 2 (two) times daily., Disp: 20 capsule, Rfl: 0 .  UNABLE TO FIND, Take by mouth every morning. OIL OF PRIMROSE, Disp: , Rfl:  No current facility-administered medications for this visit.  Facility-Administered Medications Ordered in Other Visits:  .  sodium chloride 0.9 % injection 10 mL, 10 mL, Intravenous, PRN, Volanda Napoleon, MD, 10 mL at 10/21/14 1119  Allergies:  Allergies  Allergen Reactions  . Codeine Nausea And Vomiting  . Zofran [  Ondansetron Hcl] Nausea And Vomiting  . Ciprofloxacin     Confusion/dizziness/fatigue  . Metronidazole     Confusion/dizziness/fatigue  . Skelaxin [Metaxalone]     Too strong  . Tetracycline Hcl     Not tolerated  . Avelox [Moxifloxacin] Rash    Past Medical History, Surgical history, Social history, and Family History were reviewed and updated.  Review of Systems: As above  Physical Exam:  height is 5\' 5"  (1.651 m) and weight is 128 lb (58.06 kg). Her oral temperature is 97.8 F (36.6 C). Her blood pressure is 159/63 and her pulse is 87. Her respiration is 16.   Wt Readings from Last 3 Encounters:  10/21/14 128  lb (58.06 kg)  08/07/14 125 lb (56.7 kg)  07/24/14 126 lb 2 oz (57.21 kg)     Well-developed and well-nourished elderly female in no obvious distress. Head and neck exam shows no ocular or oral lesions. There is no palpable cervical or supraclavicular lymph nodes. Lungs are clear. Cardiac exam regular rate and rhythm with no murmurs, rubs or bruits. Abdomen is soft. She has good bowel sounds. There is no fluid wave. There is no palpable liver or spleen tip. There is no obvious abdominal mass. There is no guarding or rebound tenderness. Extremities shows no clubbing, cyanosis or edema. Neurological exam shows no focal neurological deficits. Skin exam shows no rashes, ecchymoses or petechia.  Lab Results  Component Value Date   WBC 5.0 10/21/2014   HGB 11.6 10/21/2014   HCT 34.1* 10/21/2014   MCV 108* 10/21/2014   PLT 103* 10/21/2014     Chemistry      Component Value Date/Time   NA 136 10/21/2014 1037   NA 140 05/16/2014 1105   NA 130* 02/25/2013 0835   K 4.5 10/21/2014 1037   K 5.8* 05/16/2014 1105   K 5.6* 02/25/2013 0835   CL 102 10/21/2014 1037   CL 110 05/16/2014 1105   CL 102 02/25/2013 0835   CO2 24 10/21/2014 1037   CO2 25 05/16/2014 1105   CO2 25 02/25/2013 0835   BUN 26* 10/21/2014 1037   BUN 24* 05/16/2014 1105   BUN 32* 02/25/2013 0835   CREATININE 1.1 10/21/2014 1037   CREATININE 1.04 05/16/2014 1105   CREATININE 1.07 02/25/2013 0835      Component Value Date/Time   CALCIUM 9.5 10/21/2014 1037   CALCIUM 10.1 05/16/2014 1105   CALCIUM 10.1 02/25/2013 0835   ALKPHOS 119* 10/21/2014 1037   ALKPHOS 79 04/17/2014 0525   AST 35 10/21/2014 1037   AST 30 04/17/2014 0525   ALT 27 10/21/2014 1037   ALT 32 04/17/2014 0525   BILITOT 0.70 10/21/2014 1037   BILITOT 0.7 04/17/2014 0525         Impression and Plan: Tamara [name] is 81 year old white female with a history of stage IIIc colon cancer. She had this resected back in October 2014. She had a very, very  complicated and prolonged recovery and was  never in any good enough condition to take adjuvant chemotherapy.  She now has metastatic disease. She really had a hard time with therapy. As such, we are withholding therapy for right now. Over, she will never need have any treatment.  The fact that her CEA is 14 is truly a good factor for her.  I do not think we have to do any scans on her period. If I will like to see her back in 6 weeks' time.  If she ever needs  IV fluids, Nile Riggs, MD 9/26/201611:21 AM

## 2014-10-29 ENCOUNTER — Encounter: Payer: Self-pay | Admitting: *Deleted

## 2014-11-05 ENCOUNTER — Other Ambulatory Visit (HOSPITAL_BASED_OUTPATIENT_CLINIC_OR_DEPARTMENT_OTHER): Payer: Medicare Other

## 2014-11-05 ENCOUNTER — Telehealth: Payer: Self-pay | Admitting: *Deleted

## 2014-11-05 ENCOUNTER — Ambulatory Visit (HOSPITAL_BASED_OUTPATIENT_CLINIC_OR_DEPARTMENT_OTHER): Payer: Medicare Other

## 2014-11-05 ENCOUNTER — Other Ambulatory Visit: Payer: Self-pay | Admitting: *Deleted

## 2014-11-05 VITALS — BP 134/53 | HR 64 | Temp 98.4°F | Resp 18

## 2014-11-05 DIAGNOSIS — C184 Malignant neoplasm of transverse colon: Secondary | ICD-10-CM

## 2014-11-05 DIAGNOSIS — R5383 Other fatigue: Secondary | ICD-10-CM

## 2014-11-05 DIAGNOSIS — C189 Malignant neoplasm of colon, unspecified: Secondary | ICD-10-CM

## 2014-11-05 LAB — BASIC METABOLIC PANEL - CANCER CENTER ONLY
BUN, Bld: 17 mg/dL (ref 7–22)
CALCIUM: 9.4 mg/dL (ref 8.0–10.3)
CO2: 23 meq/L (ref 18–33)
CREATININE: 1.3 mg/dL — AB (ref 0.6–1.2)
Chloride: 106 mEq/L (ref 98–108)
Glucose, Bld: 127 mg/dL — ABNORMAL HIGH (ref 73–118)
Potassium: 4.3 mEq/L (ref 3.3–4.7)
Sodium: 136 mEq/L (ref 128–145)

## 2014-11-05 MED ORDER — SODIUM CHLORIDE 0.9 % IV SOLN
INTRAVENOUS | Status: DC
  Administered 2014-11-05: 12:00:00 via INTRAVENOUS

## 2014-11-05 NOTE — Telephone Encounter (Signed)
Patient c/o diarrhea. She has had 8 stools in the last 24h. Has been taking kaopectate and lomotil as prescribed. She would like to come in and receive some IV fluids. Spoke to Dr Marin Olp who approves of patient coming in and receiving fluid and having lab drawn. Appointment scheduled.

## 2014-11-05 NOTE — Patient Instructions (Signed)

## 2014-11-14 ENCOUNTER — Encounter: Payer: Self-pay | Admitting: Cardiovascular Disease

## 2014-11-14 ENCOUNTER — Ambulatory Visit (INDEPENDENT_AMBULATORY_CARE_PROVIDER_SITE_OTHER): Payer: Medicare Other | Admitting: Cardiovascular Disease

## 2014-11-14 VITALS — BP 151/77 | HR 70 | Ht 65.0 in | Wt 129.0 lb

## 2014-11-14 DIAGNOSIS — I10 Essential (primary) hypertension: Secondary | ICD-10-CM

## 2014-11-14 DIAGNOSIS — I455 Other specified heart block: Secondary | ICD-10-CM

## 2014-11-14 DIAGNOSIS — I2510 Atherosclerotic heart disease of native coronary artery without angina pectoris: Secondary | ICD-10-CM | POA: Diagnosis not present

## 2014-11-14 DIAGNOSIS — I519 Heart disease, unspecified: Secondary | ICD-10-CM | POA: Diagnosis not present

## 2014-11-14 DIAGNOSIS — I5189 Other ill-defined heart diseases: Secondary | ICD-10-CM

## 2014-11-14 NOTE — Progress Notes (Signed)
Patient ID: Tamara [name], female   DOB: 12-08-1929, 81 y.o.   MRN: [id number]     Cardiology Office Note   Date:  11/16/2014   ID:  Tamara [name], DOB [birth year], MRN [id number]  PCP:  Henrine Screws, MD  Cardiologist:   Sanda Klein, MD   Chief Complaint  Patient presents with  . Annual Exam      History of Present Illness: Tamara [name] is a 81 y.o. female who presents for   Pacemaker check and paroxysmal atrial tachycardia.  In the year that has passed since her last office visit Tamara [name] has undergone colon surgery for cancer and has completed chemotherapy. She has a right lower quadrant colostomy. She is feeling much better after completion of chemotherapy and actually has pretty good quality of life.   Her dual-chamber permanent pacemaker was implanted in 2014 and shows normal function. The device is a Medtronic Adapta  Pacemaker was implanted for recurrent syncope due to paroxysmal sinus node arrest. There is 38% atrial pacing and 0.3% ventricular pacing. . Rare episodes of PAT with 1:1 AV conduction have been recorded, maximum 6 seconds in duration   Past Medical History  Diagnosis Date  . HTN (hypertension) August 2014  . Syncope and collapse August 2014    sinus pauses/ PTVDP  . Colitis   . Breast cancer ([place])   . Colon cancer ([place]) 02/21/2013    Past Surgical History  Procedure Laterality Date  . Breast lumpectomy    . Cholecystectomy    . Tonsillectomy    . Pacemaker insertion  08/28/12    MDT  . Flexible sigmoidoscopy N/A 12/06/2012    Procedure: FLEXIBLE SIGMOIDOSCOPY;  Surgeon: Lear Ng, MD;  Location: [place];  Service: Endoscopy;  Laterality: N/A;  . Laparoscopic partial colectomy N/A 12/07/2012    Procedure: LAPAROSCOPIC-ASSISTED TRANSVERSE COLECTOMY;  Surgeon: Joyice Faster. Cornett, MD;  Location: [place];  Service: General;  Laterality: N/A;  . Colonoscopy N/A 12/15/2012    Procedure: COLONOSCOPY;  Surgeon: Jeryl Columbia, MD;   Location: [facility] ENDOSCOPY;  Service: Endoscopy;  Laterality: N/A;  . Bowel decompression N/A 12/15/2012    Procedure: BOWEL DECOMPRESSION;  Surgeon: Jeryl Columbia, MD;  Location: [facility] ENDOSCOPY;  Service: Endoscopy;  Laterality: N/A;  . Colon resection N/A 12/22/2012    Procedure: EXPLORATORY LAPAROTOMY , RIGHT Colectomy IILEOSTOMY;  Surgeon: Rolm Bookbinder, MD;  Location: [place];  Service: General;  Laterality: N/A;  . Left heart catheterization with coronary angiogram Bilateral 08/26/2012    Procedure: LEFT HEART CATHETERIZATION WITH CORONARY ANGIOGRAM;  Surgeon: Lorretta Harp, MD;  Location: [facility] CATH LAB;  Service: Cardiovascular;  Laterality: Bilateral;  . Permanent pacemaker insertion N/A 08/28/2012    Procedure: PERMANENT PACEMAKER INSERTION;  Surgeon: Sanda Klein, MD;  Location: [place] CATH LAB;  Service: Cardiovascular;  Laterality: N/A;     Current Outpatient Prescriptions  Medication Sig Dispense Refill  . Alum & Mag Hydroxide-Simeth (MAGIC MOUTHWASH) SOLN Take 5 mLs by mouth 3 (three) times daily as needed for mouth pain (swish and spit). 240 mL 2  . B Complex-C (SUPER B COMPLEX PO) Take 1 tablet by mouth every other day. Alternating days with B complex    . Biotin 5 MG TABS Take 1 tablet by mouth every morning.     . Calcium-Magnesium-Vitamin D (CALCIUM MAGNESIUM PO) Take 1 tablet by mouth daily.    . Cholecalciferol (VITAMIN D) 2000 UNITS tablet Take 2,000 Units by mouth daily with breakfast.    .  dexamethasone (DECADRON) 4 MG tablet Take 2 tablets (8 mg total) by mouth 2 (two) times daily with a meal. Start the day after chemotherapy for 2 days. Take with food. 30 tablet 1  . diphenoxylate-atropine (LOMOTIL) 2.5-0.025 MG per tablet Take 2 tablets by mouth 4 (four) times daily. Take one tablet by mouth once daily 240 tablet 6  . feeding supplement, ENSURE COMPLETE, (ENSURE COMPLETE) LIQD Take 237 mLs by mouth 2 (two) times daily between meals. *Vanilla*    . lidocaine-prilocaine (EMLA)  cream Apply 1 application topically as needed. Apply to port site one hour before access 30 g 0  . Multiple Vitamin (MULTIVITAMIN WITH MINERALS) TABS tablet Take 1 tablet by mouth daily.    . Omega 3 1000 MG CAPS Take 1 capsule by mouth daily with breakfast.     . Probiotic Product (ALIGN PO) Take 1 capsule by mouth daily with breakfast.     . prochlorperazine (COMPAZINE) 10 MG tablet Take 1 tablet (10 mg total) by mouth every 6 (six) hours as needed (Nausea or vomiting). 30 tablet 1  . saccharomyces boulardii (FLORASTOR) 250 MG capsule Take 1 capsule (250 mg total) by mouth 2 (two) times daily. 20 capsule 0  . UNABLE TO FIND Take by mouth every morning. OIL OF PRIMROSE     No current facility-administered medications for this visit.    Allergies:   Codeine; Zofran; Ciprofloxacin; Metronidazole; Skelaxin; Tetracycline hcl; and Avelox    Social History:  The patient  reports that she has never smoked. She has never used smokeless tobacco. She reports that she does not drink alcohol or use illicit drugs.   Family History:  The patient's family history includes Alzheimer's disease in her mother; Coronary artery disease in her brother; Renal Disease in her father.    ROS:  Please see the history of present illness.    Otherwise, review of systems positive for none.   All other systems are reviewed and negative.    PHYSICAL EXAM: VS:  BP 151/77 mmHg  Pulse 70  Ht 5\' 5"  (1.651 m)  Wt 129 lb (58.514 kg)  BMI 21.47 kg/m2 , BMI Body mass index is 21.47 kg/(m^2).  General: Alert, oriented x3, no distress Head: no evidence of trauma, PERRL, EOMI, no exophtalmos or lid lag, no myxedema, no xanthelasma; normal ears, nose and oropharynx Neck: normal jugular venous pulsations and no hepatojugular reflux; brisk carotid pulses without delay and no carotid bruits Chest: clear to auscultation, no signs of consolidation by percussion or palpation, normal fremitus, symmetrical and full respiratory  excursions Cardiovascular: normal position and quality of the apical impulse, regular rhythm, normal first and second heart sounds, no  murmurs, rubs or gallops Abdomen: no tenderness or distention, no masses by palpation, no abnormal pulsatility or arterial bruits, normal bowel sounds, no hepatosplenomegaly, RLQ colostomy Extremities: no clubbing, cyanosis or edema; 2+ radial, ulnar and brachial pulses bilaterally; 2+ right femoral, posterior tibial and dorsalis pedis pulses; 2+ left femoral, posterior tibial and dorsalis pedis pulses; no subclavian or femoral bruits Neurological: grossly nonfocal Psych: euthymic mood, full affect   EKG:  EKG is ordered today. The ekg ordered today demonstrates  Atrial paced, ventricular sensed, otherwise normal   Recent Labs: 10/21/2014: ALT(SGPT) 27; HGB 11.6; Platelets 103* 11/05/2014: BUN, Bld 17; Creat 1.3*; Potassium 4.3; Sodium 136    Lipid Panel    Component Value Date/Time   TRIG 75 12/25/2012 0435      Wt Readings from Last 3 Encounters:  11/14/14 129 lb (58.514 kg)  10/21/14 128 lb (58.06 kg)  08/07/14 125 lb (56.7 kg)      Other studies Reviewed: Additional studies/ records that were reviewed today include:  Records from oncology clinic.   ASSESSMENT AND PLAN:  1.   Paroxysmal sinus node arrest complicated by syncope, with normally functioning dual-chamber permanent pacemaker. She has had difficulty with remote pacemaker downloads and prefers in person office checks.  2.  Her brief episodes of paroxysmal atrial tachycardia are asymptomatic and did not require specific therapy.    Current medicines are reviewed at length with the patient today.  The patient does not have concerns regarding medicines.  The following changes have been made:  no change  Labs/ tests ordered today include:  No orders of the defined types were placed in this encounter.     Patient Instructions  Your physician recommends that you schedule a  follow-up appointment in: [place] A DEVICE CHECK AT [place].  Dr. Sallyanne Kuster recommends that you schedule a follow-up appointment in: [place] ([facility]).         Mikael Spray, MD  11/16/2014 10:14 PM    Sanda Klein, MD, [facility] [phone] office [phone] pager

## 2014-11-14 NOTE — Patient Instructions (Signed)
Your physician recommends that you schedule a follow-up appointment in: [place] A DEVICE CHECK AT [place].  Dr. Sallyanne Kuster recommends that you schedule a follow-up appointment in: [place] ([facility]).

## 2014-11-16 ENCOUNTER — Other Ambulatory Visit: Payer: Self-pay | Admitting: Hematology & Oncology

## 2014-11-19 ENCOUNTER — Other Ambulatory Visit: Payer: Self-pay | Admitting: Family

## 2014-11-19 DIAGNOSIS — C189 Malignant neoplasm of colon, unspecified: Secondary | ICD-10-CM

## 2014-11-19 MED ORDER — DIPHENOXYLATE-ATROPINE 2.5-0.025 MG PO TABS: ORAL_TABLET | ORAL | Status: DC

## 2014-11-19 NOTE — Addendum Note (Signed)
Addended by: Janett Labella A on: 11/19/2014 07:46 AM   Modules accepted: Orders

## 2014-12-04 ENCOUNTER — Encounter: Payer: Medicare Other | Admitting: Cardiovascular Disease

## 2014-12-10 LAB — CUP PACEART INCLINIC DEVICE CHECK
Brady Statistic AP VS Percent: 38 %
Brady Statistic AS VP Percent: 0 %
Brady Statistic AS VS Percent: 62 %
Date Time Interrogation Session: 20161020125515
Implantable Lead Implant Date: 20140804
Implantable Lead Location: 753859
Implantable Lead Model: 5076
Lead Channel Impedance Value: 499 Ohm
Lead Channel Pacing Threshold Amplitude: 0.875 V
Lead Channel Pacing Threshold Pulse Width: 0.4 ms
Lead Channel Sensing Intrinsic Amplitude: 2.8 mV
Lead Channel Setting Pacing Pulse Width: 0.4 ms
MDC IDC LEAD IMPLANT DT: 20140804
MDC IDC LEAD LOCATION: 753860
MDC IDC MSMT BATTERY IMPEDANCE: 100 Ohm
MDC IDC MSMT BATTERY REMAINING LONGEVITY: 170 mo
MDC IDC MSMT BATTERY VOLTAGE: 2.79 V
MDC IDC MSMT LEADCHNL RA PACING THRESHOLD AMPLITUDE: 0.5 V
MDC IDC MSMT LEADCHNL RA PACING THRESHOLD PULSEWIDTH: 0.4 ms
MDC IDC MSMT LEADCHNL RV IMPEDANCE VALUE: 499 Ohm
MDC IDC MSMT LEADCHNL RV SENSING INTR AMPL: 8 mV
MDC IDC SET LEADCHNL RA PACING AMPLITUDE: 1.5 V
MDC IDC SET LEADCHNL RV PACING AMPLITUDE: 2 V
MDC IDC SET LEADCHNL RV SENSING SENSITIVITY: 4 mV
MDC IDC STAT BRADY AP VP PERCENT: 0 %

## 2014-12-13 ENCOUNTER — Other Ambulatory Visit (HOSPITAL_BASED_OUTPATIENT_CLINIC_OR_DEPARTMENT_OTHER): Payer: Medicare Other

## 2014-12-13 ENCOUNTER — Ambulatory Visit (HOSPITAL_BASED_OUTPATIENT_CLINIC_OR_DEPARTMENT_OTHER): Payer: Medicare Other

## 2014-12-13 ENCOUNTER — Encounter: Payer: Self-pay | Admitting: Hematology & Oncology

## 2014-12-13 ENCOUNTER — Ambulatory Visit (HOSPITAL_BASED_OUTPATIENT_CLINIC_OR_DEPARTMENT_OTHER): Payer: Medicare Other | Admitting: Hematology & Oncology

## 2014-12-13 VITALS — BP 180/71 | HR 82 | Temp 97.7°F | Resp 16 | Ht 65.0 in

## 2014-12-13 DIAGNOSIS — C189 Malignant neoplasm of colon, unspecified: Secondary | ICD-10-CM

## 2014-12-13 DIAGNOSIS — I2510 Atherosclerotic heart disease of native coronary artery without angina pectoris: Secondary | ICD-10-CM

## 2014-12-13 DIAGNOSIS — C184 Malignant neoplasm of transverse colon: Secondary | ICD-10-CM

## 2014-12-13 DIAGNOSIS — C50919 Malignant neoplasm of unspecified site of unspecified female breast: Secondary | ICD-10-CM | POA: Diagnosis not present

## 2014-12-13 LAB — CBC WITH DIFFERENTIAL (CANCER CENTER ONLY)
BASO#: 0 10*3/uL (ref 0.0–0.2)
BASO%: 0.4 % (ref 0.0–2.0)
EOS%: 3 % (ref 0.0–7.0)
Eosinophils Absolute: 0.2 10*3/uL (ref 0.0–0.5)
HEMATOCRIT: 36.7 % (ref 34.8–46.6)
HEMOGLOBIN: 12.4 g/dL (ref 11.6–15.9)
LYMPH#: 2 10*3/uL (ref 0.9–3.3)
LYMPH%: 35.2 % (ref 14.0–48.0)
MCH: 35.1 pg — ABNORMAL HIGH (ref 26.0–34.0)
MCHC: 33.8 g/dL (ref 32.0–36.0)
MCV: 104 fL — AB (ref 81–101)
MONO#: 0.7 10*3/uL (ref 0.1–0.9)
MONO%: 12.4 % (ref 0.0–13.0)
NEUT%: 49 % (ref 39.6–80.0)
NEUTROS ABS: 2.8 10*3/uL (ref 1.5–6.5)
Platelets: 104 10*3/uL — ABNORMAL LOW (ref 145–400)
RBC: 3.53 10*6/uL — ABNORMAL LOW (ref 3.70–5.32)
RDW: 11.7 % (ref 11.1–15.7)
WBC: 5.6 10*3/uL (ref 3.9–10.0)

## 2014-12-13 LAB — COMPREHENSIVE METABOLIC PANEL
ALBUMIN: 4.3 g/dL (ref 3.6–5.1)
ALK PHOS: 151 U/L — AB (ref 33–130)
ALT: 22 U/L (ref 6–29)
AST: 30 U/L (ref 10–35)
BILIRUBIN TOTAL: 0.4 mg/dL (ref 0.2–1.2)
BUN: 25 mg/dL (ref 7–25)
CALCIUM: 9.5 mg/dL (ref 8.6–10.4)
CO2: 25 mmol/L (ref 20–31)
CREATININE: 0.96 mg/dL — AB (ref 0.60–0.88)
Chloride: 103 mmol/L (ref 98–110)
GLUCOSE: 97 mg/dL (ref 65–99)
POTASSIUM: 4.5 mmol/L (ref 3.5–5.3)
Sodium: 138 mmol/L (ref 135–146)
Total Protein: 7.2 g/dL (ref 6.1–8.1)

## 2014-12-13 LAB — LACTATE DEHYDROGENASE: LDH: 161 U/L (ref 94–250)

## 2014-12-13 MED ORDER — SODIUM CHLORIDE 0.9 % IJ SOLN
10.0000 mL | INTRAMUSCULAR | Status: DC | PRN
  Administered 2014-12-13: 10 mL via INTRAVENOUS
  Filled 2014-12-13: qty 10

## 2014-12-13 MED ORDER — HEPARIN SOD (PORK) LOCK FLUSH 100 UNIT/ML IV SOLN
500.0000 [IU] | Freq: Once | INTRAVENOUS | Status: AC
  Administered 2014-12-13: 500 [IU] via INTRAVENOUS
  Filled 2014-12-13: qty 5

## 2014-12-13 NOTE — Progress Notes (Signed)
Tamara [name] presented for Portacath access and flush. Proper placement of portacath confirmed by CXR. Portacath located in the right chest wall accessed with  H 20 needle. Clean, Dry and Intact No blood return present. Portacath flushed with 25ml NS and 500U/48ml Heparin per protocol and needle removed intact. Procedure without incident. Patient tolerated procedure well.

## 2014-12-13 NOTE — Patient Instructions (Signed)

## 2014-12-13 NOTE — Progress Notes (Signed)
Hematology and Oncology Follow Up Visit  SALIMATOU KAMBER [id number] [birth year] 81 y.o. 12/13/2014   Principle Diagnosis:   Recurrent/metastatic colon cancer  Current Therapy:    Patient s/p cycle 3 of FOLFOX - chemotherapy to be stopped due to side effects     Interim History:  Ms. Zeeman is back for follow-up. She really looks great.  She's getting ready for the holidays. She and her husband probably will stay in the area.  Her last CEA was 14. This is amazing as it keeps going down without any therapy. Per graph her colostomy is functioning okay. She has a lot of liquid stool.  She's not had any problems with dehydration. Every now and then, she has come in for fluids  She has had no bleeding.  She has had no pain. There's been no nausea or vomiting.  She has had no leg swelling.  Overall, her she's had a very good performance status. Currently, her performance status is ECOG 1..   Medications:  Current outpatient prescriptions:  .  Alum & Mag Hydroxide-Simeth (MAGIC MOUTHWASH) SOLN, Take 5 mLs by mouth 3 (three) times daily as needed for mouth pain (swish and spit)., Disp: 240 mL, Rfl: 2 .  B Complex-C (SUPER B COMPLEX PO), Take 1 tablet by mouth every other day. Alternating days with B complex, Disp: , Rfl:  .  Biotin 5 MG TABS, Take 1 tablet by mouth every morning. , Disp: , Rfl:  .  Calcium-Magnesium-Vitamin D (CALCIUM MAGNESIUM PO), Take 1 tablet by mouth daily., Disp: , Rfl:  .  Cholecalciferol (VITAMIN D) 2000 UNITS tablet, Take 2,000 Units by mouth daily with breakfast., Disp: , Rfl:  .  dexamethasone (DECADRON) 4 MG tablet, Take 2 tablets (8 mg total) by mouth 2 (two) times daily with a meal. Start the day after chemotherapy for 2 days. Take with food., Disp: 30 tablet, Rfl: 1 .  diphenoxylate-atropine (LOMOTIL) 2.5-0.025 MG tablet, take 2 tablets by mouth four times a day, Disp: 240 tablet, Rfl: 2 .  feeding supplement, ENSURE COMPLETE, (ENSURE COMPLETE) LIQD,  Take 237 mLs by mouth 2 (two) times daily between meals. *Vanilla*, Disp: , Rfl:  .  lidocaine-prilocaine (EMLA) cream, Apply 1 application topically as needed. Apply to port site one hour before access, Disp: 30 g, Rfl: 0 .  Multiple Vitamin (MULTIVITAMIN WITH MINERALS) TABS tablet, Take 1 tablet by mouth daily., Disp: , Rfl:  .  Omega 3 1000 MG CAPS, Take 1 capsule by mouth daily with breakfast. , Disp: , Rfl:  .  Probiotic Product (ALIGN PO), Take 1 capsule by mouth daily with breakfast. , Disp: , Rfl:  .  prochlorperazine (COMPAZINE) 10 MG tablet, Take 1 tablet (10 mg total) by mouth every 6 (six) hours as needed (Nausea or vomiting)., Disp: 30 tablet, Rfl: 1 .  saccharomyces boulardii (FLORASTOR) 250 MG capsule, Take 1 capsule (250 mg total) by mouth 2 (two) times daily., Disp: 20 capsule, Rfl: 0 .  UNABLE TO FIND, Take by mouth every morning. OIL OF PRIMROSE, Disp: , Rfl:   Allergies:  Allergies  Allergen Reactions  . Codeine Nausea And Vomiting  . Zofran [Ondansetron Hcl] Nausea And Vomiting  . Ciprofloxacin     Confusion/dizziness/fatigue  . Metronidazole     Confusion/dizziness/fatigue  . Skelaxin [Metaxalone]     Too strong  . Tetracycline Hcl     Not tolerated  . Avelox [Moxifloxacin] Rash    Past Medical History, Surgical history, Social history, and  Family History were reviewed and updated.  Review of Systems: As above  Physical Exam:  vitals were not taken for this visit.  Wt Readings from Last 3 Encounters:  11/14/14 129 lb (58.514 kg)  10/21/14 128 lb (58.06 kg)  08/07/14 125 lb (56.7 kg)     Well-developed and well-nourished elderly female in no obvious distress. Head and neck exam shows no ocular or oral lesions. There is no palpable cervical or supraclavicular lymph nodes. Lungs are clear. Cardiac exam regular rate and rhythm with no murmurs, rubs or bruits. Abdomen is soft. She has good bowel sounds. There is no fluid wave. There is no palpable liver or  spleen tip. There is no obvious abdominal mass. There is no guarding or rebound tenderness. Extremities shows no clubbing, cyanosis or edema. Neurological exam shows no focal neurological deficits. Skin exam shows no rashes, ecchymoses or petechia.  Lab Results  Component Value Date   WBC 5.0 10/21/2014   HGB 11.6 10/21/2014   HCT 34.1* 10/21/2014   MCV 108* 10/21/2014   PLT 103* 10/21/2014     Chemistry      Component Value Date/Time   NA 136 11/05/2014 1223   NA 140 05/16/2014 1105   NA 130* 02/25/2013 0835   K 4.3 11/05/2014 1223   K 5.8* 05/16/2014 1105   K 5.6* 02/25/2013 0835   CL 106 11/05/2014 1223   CL 110 05/16/2014 1105   CL 102 02/25/2013 0835   CO2 23 11/05/2014 1223   CO2 25 05/16/2014 1105   CO2 25 02/25/2013 0835   BUN 17 11/05/2014 1223   BUN 24* 05/16/2014 1105   BUN 32* 02/25/2013 0835   CREATININE 1.3* 11/05/2014 1223   CREATININE 1.04 05/16/2014 1105   CREATININE 1.07 02/25/2013 0835      Component Value Date/Time   CALCIUM 9.4 11/05/2014 1223   CALCIUM 10.1 05/16/2014 1105   CALCIUM 10.1 02/25/2013 0835   ALKPHOS 119* 10/21/2014 1037   ALKPHOS 79 04/17/2014 0525   AST 35 10/21/2014 1037   AST 30 04/17/2014 0525   ALT 27 10/21/2014 1037   ALT 32 04/17/2014 0525   BILITOT 0.70 10/21/2014 1037   BILITOT 0.7 04/17/2014 0525         Impression and Plan: Ms. Sayre is 81 year old white female with a history of stage IIIc colon cancer. She had this resected back in October 2014. She had a very, very complicated and prolonged recovery and was  never in any good enough condition to take adjuvant chemotherapy.  She now has metastatic disease. She really had a hard time with therapy. As such, we are withholding therapy for right now. Hopefully, she will never need have any treatment.  The fact that her CEA was 14 is truly a good factor for her.  I do not think we have to do any scans on her period. If I will like to see her back in 8  weeks   Nile Riggs, MD 11/18/20162:48 PM

## 2014-12-14 LAB — CEA: CEA: 65.4 ng/mL — AB (ref 0.0–5.0)

## 2014-12-24 ENCOUNTER — Telehealth: Payer: Self-pay | Admitting: *Deleted

## 2014-12-24 ENCOUNTER — Ambulatory Visit (HOSPITAL_BASED_OUTPATIENT_CLINIC_OR_DEPARTMENT_OTHER): Payer: Medicare Other

## 2014-12-24 VITALS — BP 142/66 | HR 72 | Temp 97.9°F | Resp 18

## 2014-12-24 DIAGNOSIS — R197 Diarrhea, unspecified: Secondary | ICD-10-CM | POA: Diagnosis present

## 2014-12-24 DIAGNOSIS — E860 Dehydration: Secondary | ICD-10-CM | POA: Diagnosis not present

## 2014-12-24 MED ORDER — SODIUM CHLORIDE 0.9 % IV SOLN
Freq: Once | INTRAVENOUS | Status: AC
  Administered 2014-12-24: 14:00:00 via INTRAVENOUS

## 2014-12-24 NOTE — Telephone Encounter (Signed)
Patient c/o feelings of dehydration. She's had increased stool output from her colostomy in the last 24h and is feeling like she could use some IVF. Spoke to Dr Marin Olp who is fine with patient coming in for fluids. Appointment made. Patient aware.

## 2014-12-24 NOTE — Patient Instructions (Signed)

## 2015-01-05 ENCOUNTER — Encounter (HOSPITAL_COMMUNITY): Payer: Self-pay | Admitting: Family Medicine

## 2015-01-05 ENCOUNTER — Inpatient Hospital Stay (HOSPITAL_COMMUNITY)
Admission: EM | Admit: 2015-01-05 | Discharge: 2015-01-08 | DRG: 375 | Disposition: A | Discharge status: Home / Self Care | Payer: Medicare Other | Attending: Internal Medicine | Admitting: Internal Medicine

## 2015-01-05 DIAGNOSIS — Z9221 Personal history of antineoplastic chemotherapy: Secondary | ICD-10-CM | POA: Diagnosis not present

## 2015-01-05 DIAGNOSIS — C787 Secondary malignant neoplasm of liver and intrahepatic bile duct: Secondary | ICD-10-CM | POA: Diagnosis present

## 2015-01-05 DIAGNOSIS — R970 Elevated carcinoembryonic antigen [CEA]: Secondary | ICD-10-CM | POA: Diagnosis not present

## 2015-01-05 DIAGNOSIS — Z66 Do not resuscitate: Secondary | ICD-10-CM | POA: Diagnosis present

## 2015-01-05 DIAGNOSIS — Z510 Encounter for antineoplastic radiation therapy: Secondary | ICD-10-CM | POA: Diagnosis not present

## 2015-01-05 DIAGNOSIS — D696 Thrombocytopenia, unspecified: Secondary | ICD-10-CM | POA: Diagnosis present

## 2015-01-05 DIAGNOSIS — Z932 Ileostomy status: Secondary | ICD-10-CM | POA: Diagnosis not present

## 2015-01-05 DIAGNOSIS — Z9049 Acquired absence of other specified parts of digestive tract: Secondary | ICD-10-CM | POA: Diagnosis not present

## 2015-01-05 DIAGNOSIS — K922 Gastrointestinal hemorrhage, unspecified: Secondary | ICD-10-CM | POA: Diagnosis present

## 2015-01-05 DIAGNOSIS — K625 Hemorrhage of anus and rectum: Secondary | ICD-10-CM | POA: Diagnosis present

## 2015-01-05 DIAGNOSIS — Z853 Personal history of malignant neoplasm of breast: Secondary | ICD-10-CM

## 2015-01-05 DIAGNOSIS — Z79899 Other long term (current) drug therapy: Secondary | ICD-10-CM | POA: Diagnosis not present

## 2015-01-05 DIAGNOSIS — C189 Malignant neoplasm of colon, unspecified: Principal | ICD-10-CM | POA: Insufficient documentation

## 2015-01-05 DIAGNOSIS — Z8249 Family history of ischemic heart disease and other diseases of the circulatory system: Secondary | ICD-10-CM | POA: Diagnosis not present

## 2015-01-05 DIAGNOSIS — Z820 Family history of epilepsy and other diseases of the nervous system: Secondary | ICD-10-CM | POA: Diagnosis not present

## 2015-01-05 DIAGNOSIS — Z85038 Personal history of other malignant neoplasm of large intestine: Secondary | ICD-10-CM | POA: Diagnosis not present

## 2015-01-05 DIAGNOSIS — Z950 Presence of cardiac pacemaker: Secondary | ICD-10-CM | POA: Diagnosis not present

## 2015-01-05 DIAGNOSIS — K648 Other hemorrhoids: Secondary | ICD-10-CM | POA: Diagnosis present

## 2015-01-05 DIAGNOSIS — C7800 Secondary malignant neoplasm of unspecified lung: Secondary | ICD-10-CM | POA: Diagnosis present

## 2015-01-05 DIAGNOSIS — E860 Dehydration: Secondary | ICD-10-CM | POA: Diagnosis present

## 2015-01-05 DIAGNOSIS — R918 Other nonspecific abnormal finding of lung field: Secondary | ICD-10-CM | POA: Diagnosis not present

## 2015-01-05 DIAGNOSIS — I10 Essential (primary) hypertension: Secondary | ICD-10-CM | POA: Diagnosis present

## 2015-01-05 DIAGNOSIS — K529 Noninfective gastroenteritis and colitis, unspecified: Secondary | ICD-10-CM | POA: Diagnosis not present

## 2015-01-05 HISTORY — DX: Other specified heart block: I45.5

## 2015-01-05 LAB — TYPE AND SCREEN
ABO/RH(D): A POS
ANTIBODY SCREEN: NEGATIVE

## 2015-01-05 LAB — CBC
HCT: 36 % (ref 36.0–46.0)
Hemoglobin: 12.1 g/dL (ref 12.0–15.0)
MCH: 34.2 pg — ABNORMAL HIGH (ref 26.0–34.0)
MCHC: 33.6 g/dL (ref 30.0–36.0)
MCV: 101.7 fL — ABNORMAL HIGH (ref 78.0–100.0)
PLATELETS: 98 10*3/uL — AB (ref 150–400)
RBC: 3.54 MIL/uL — ABNORMAL LOW (ref 3.87–5.11)
RDW: 12.9 % (ref 11.5–15.5)
WBC: 6 10*3/uL (ref 4.0–10.5)

## 2015-01-05 LAB — COMPREHENSIVE METABOLIC PANEL
ALBUMIN: 4.6 g/dL (ref 3.5–5.0)
ALT: 27 U/L (ref 14–54)
AST: 37 U/L (ref 15–41)
Alkaline Phosphatase: 135 U/L — ABNORMAL HIGH (ref 38–126)
Anion gap: 9 (ref 5–15)
BUN: 31 mg/dL — AB (ref 6–20)
CHLORIDE: 107 mmol/L (ref 101–111)
CO2: 22 mmol/L (ref 22–32)
Calcium: 10 mg/dL (ref 8.9–10.3)
Creatinine, Ser: 0.99 mg/dL (ref 0.44–1.00)
GFR calc Af Amer: 59 mL/min — ABNORMAL LOW (ref >60)
GFR calc non Af Amer: 51 mL/min — ABNORMAL LOW (ref >60)
GLUCOSE: 116 mg/dL — AB (ref 65–99)
POTASSIUM: 4.5 mmol/L (ref 3.5–5.1)
Sodium: 138 mmol/L (ref 135–145)
TOTAL PROTEIN: 7.7 g/dL (ref 6.5–8.1)
Total Bilirubin: 0.7 mg/dL (ref 0.3–1.2)

## 2015-01-05 LAB — POC OCCULT BLOOD, ED: Fecal Occult Bld: POSITIVE — AB

## 2015-01-05 LAB — PROTIME-INR
INR: 1.07 (ref 0.00–1.49)
Prothrombin Time: 14.1 seconds (ref 11.6–15.2)

## 2015-01-05 NOTE — ED Notes (Signed)
Pt has had colon cancer with a colostomy. Pt reports last night she had the urge at her rectum to have a bowel movement. Pt reports a blot of bright red blood. Today, she had the urge to have a bowel movement. Had a larger amount of darker blood with clots.

## 2015-01-05 NOTE — ED Notes (Signed)
Pt and husband at bedside had numerous questions regarding plan of care.  RN spent some time answering questions and pt verbalized that she had no additional needs.  Dr Billy Fischer is at the bedside at this time.

## 2015-01-05 NOTE — ED Provider Notes (Signed)
CSN: [id number]     Arrival date & time 01/05/15  1940 History   First MD Initiated Contact with Patient 01/05/15 2058     Chief Complaint  Patient presents with  . Rectal Bleeding     HPI     81 year old female presents today with rectal bleeding. Patient has history of stage III recurrent metastatic colon cancer with resection in October 2014 with placement of colostomy bag. Patient being followed by Burney Gauze MD oncology with most recent appointment on 12/13/2014. Patient is status post chemotherapy not currently receiving any therapy. Patient reports that since her colostomy placement she has not had any movements or bleeding from her rectum until today. She reports that last night she had the urge to defecate went to the bathroom and had a small streak of red blood on the tissue from her rectum. She reports again this morning she had the urge to defecate went to use the restroom and had a large clot. Second episode today produced multiple large clots from the rectum. Patient denies any fever, chills, nausea, vomiting, abnormal output from the colostomy, abdominal or rectal pain. She denies any recent changes in weight, fever, night sweats, or any other concerning signs or symptoms. She denies any antiplatelets or anticoagulation therapy.   Past Medical History  Diagnosis Date  . HTN (hypertension) August 2014  . Syncope and collapse August 2014    sinus pauses/ PTVDP  . Colitis   . Breast cancer ([place])   . Colon cancer ([place]) 02/21/2013  . Sinus arrest 08/28/2012   Past Surgical History  Procedure Laterality Date  . Breast lumpectomy    . Cholecystectomy    . Tonsillectomy    . Pacemaker insertion  08/28/12    MDT  . Flexible sigmoidoscopy N/A 12/06/2012    Procedure: FLEXIBLE SIGMOIDOSCOPY;  Surgeon: Lear Ng, MD;  Location: [place];  Service: Endoscopy;  Laterality: N/A;  . Laparoscopic partial colectomy N/A 12/07/2012    Procedure: LAPAROSCOPIC-ASSISTED  TRANSVERSE COLECTOMY;  Surgeon: Joyice Faster. Cornett, MD;  Location: [place];  Service: General;  Laterality: N/A;  . Colonoscopy N/A 12/15/2012    Procedure: COLONOSCOPY;  Surgeon: Jeryl Columbia, MD;  Location: [facility] ENDOSCOPY;  Service: Endoscopy;  Laterality: N/A;  . Bowel decompression N/A 12/15/2012    Procedure: BOWEL DECOMPRESSION;  Surgeon: Jeryl Columbia, MD;  Location: [facility] ENDOSCOPY;  Service: Endoscopy;  Laterality: N/A;  . Colon resection N/A 12/22/2012    Procedure: EXPLORATORY LAPAROTOMY , RIGHT Colectomy IILEOSTOMY;  Surgeon: Rolm Bookbinder, MD;  Location: [place];  Service: General;  Laterality: N/A;  . Left heart catheterization with coronary angiogram Bilateral 08/26/2012    Procedure: LEFT HEART CATHETERIZATION WITH CORONARY ANGIOGRAM;  Surgeon: Lorretta Harp, MD;  Location: [facility] CATH LAB;  Service: Cardiovascular;  Laterality: Bilateral;  . Permanent pacemaker insertion N/A 08/28/2012    Procedure: PERMANENT PACEMAKER INSERTION;  Surgeon: Sanda Klein, MD;  Location: [place] CATH LAB;  Service: Cardiovascular;  Laterality: N/A;   Family History  Problem Relation Age of Onset  . Alzheimer's disease Mother   . Renal Disease Father   . Coronary artery disease Brother    Social History  Substance Use Topics  . Smoking status: Never Smoker   . Smokeless tobacco: Never Used     Comment: never used tobacco  . Alcohol Use: No   OB History    No data available     Review of Systems  All other systems reviewed and  are negative.   Allergies  Codeine; Zofran; Ciprofloxacin; Metronidazole; Skelaxin; Tetracycline hcl; and Avelox  Home Medications   Prior to Admission medications   Medication Sig Start Date End Date Taking? Authorizing Provider  antiseptic oral rinse (BIOTENE) LIQD 15 mLs by Mouth Rinse route 5 (five) times daily. After brushing teeth   Yes Historical Provider, MD  b complex vitamins tablet Take 1 tablet by mouth every other day.   Yes Historical Provider, MD  B  Complex-C (SUPER B COMPLEX PO) Take 1 tablet by mouth every other day. Alternating days with B complex   Yes Historical Provider, MD  Biotin 5 MG TABS Take 1 tablet by mouth every morning.    Yes Historical Provider, MD  Calcium-Magnesium-Vitamin D (CALCIUM MAGNESIUM PO) Take 1 tablet by mouth daily.   Yes Historical Provider, MD  Cholecalciferol (VITAMIN D) 2000 UNITS tablet Take 2,000 Units by mouth daily with breakfast.   Yes Historical Provider, MD  Dentifrices (BIOTENE DRY MOUTH) PSTE Place 1 application onto teeth 5 (five) times daily.   Yes Historical Provider, MD  diphenoxylate-atropine (LOMOTIL) 2.5-0.025 MG tablet take 2 tablets by mouth four times a day 11/19/14  Yes Eliezer Bottom, NP  feeding supplement, ENSURE COMPLETE, (ENSURE COMPLETE) LIQD Take 237 mLs by mouth 2 (two) times daily between meals. [name]* 01/01/13  Yes Josetta Huddle, MD  lidocaine-prilocaine (EMLA) cream Apply 1 application topically as needed. Apply to port site one hour before access 05/13/14  Yes Volanda Napoleon, MD  Multiple Vitamin (MULTIVITAMIN WITH MINERALS) TABS tablet Take 1 tablet by mouth daily.   Yes Historical Provider, MD  Multiple Vitamins-Minerals (ANTIOXIDANT VITAMINS PO) Take 1 tablet by mouth daily.   Yes Historical Provider, MD  Omega 3 1000 MG CAPS Take 1 capsule by mouth daily with breakfast.    Yes Historical Provider, MD  Probiotic Product (ALIGN PO) Take 1 capsule by mouth daily with breakfast.    Yes Historical Provider, MD  saccharomyces boulardii (FLORASTOR) 250 MG capsule Take 1 capsule (250 mg total) by mouth 2 (two) times daily. 04/18/14  Yes Costin Karlyne Greenspan, MD  UNABLE TO FIND Take by mouth every morning. OIL OF PRIMROSE   Yes Historical Provider, MD  Alum & Mag Hydroxide-Simeth (MAGIC MOUTHWASH) SOLN Take 5 mLs by mouth 3 (three) times daily as needed for mouth pain (swish and spit). Patient not taking: Reported on 01/05/2015 07/19/14   Eliezer Bottom, NP  dexamethasone (DECADRON)  4 MG tablet Take 2 tablets (8 mg total) by mouth 2 (two) times daily with a meal. Start the day after chemotherapy for 2 days. Take with food. Patient not taking: Reported on 01/05/2015 05/22/14   Volanda Napoleon, MD  prochlorperazine (COMPAZINE) 10 MG tablet Take 1 tablet (10 mg total) by mouth every 6 (six) hours as needed (Nausea or vomiting). Patient not taking: Reported on 01/05/2015 05/22/14   Volanda Napoleon, MD   BP 165/70 mmHg  Pulse 73  Temp(Src) 97.8 F (36.6 C) (Oral)  Resp 13  Ht 5\' 6"  (1.676 m)  Wt 58.06 kg  BMI 20.67 kg/m2  SpO2 97% Physical Exam  Constitutional: She is oriented to person, place, and time. She appears well-developed and well-nourished.  HENT:  Head: Normocephalic and atraumatic.  Eyes: Conjunctivae are normal. Pupils are equal, round, and reactive to light. Right eye exhibits no discharge. Left eye exhibits no discharge. No scleral icterus.  Neck: Normal range of motion. No JVD present. No tracheal deviation present.  Pulmonary/Chest: Effort normal. No stridor.  Abdominal: She exhibits no distension and no mass. There is no tenderness. There is no rebound and no guarding.  Patient colostomy with no bleeding, or surrounding signs of infection  Genitourinary:  Patient refused rectal exam  Neurological: She is alert and oriented to person, place, and time. Coordination normal.  Psychiatric: She has a normal mood and affect. Her behavior is normal. Judgment and thought content normal.  Nursing note and vitals reviewed.   ED Course  Procedures (including critical care time) Labs Review Labs Reviewed  COMPREHENSIVE METABOLIC PANEL - Abnormal; Notable for the following:    Glucose, Bld 116 (*)    BUN 31 (*)    Alkaline Phosphatase 135 (*)    GFR calc non Af Amer 51 (*)    GFR calc Af Amer 59 (*)    All other components within normal limits  CBC - Abnormal; Notable for the following:    RBC 3.54 (*)    MCV 101.7 (*)    MCH 34.2 (*)    Platelets 98  (*)    All other components within normal limits  POC OCCULT BLOOD, ED - Abnormal; Notable for the following:    Fecal Occult Bld POSITIVE (*)    All other components within normal limits  PROTIME-INR  TYPE AND SCREEN  ABO/RH    Imaging Review No results found. I have personally reviewed and evaluated these images and lab results as part of my medical decision-making.   EKG Interpretation None      MDM   Final diagnoses:  Rectal bleeding    Labs: Point care occult blood, CMP, CBC, PT/INR, type and screen- no significant findings- platelet 98  Imaging:  Consults: Hospitalist service, general surgery, gastroenterology  Therapeutics:  Discharge Meds:   Assessment/Plan: 81 year old female with a history of metastatic colon cancer presents today with rectal bleeding. Nursing staff was able to obtain Hemoccult card which was positive showing no frank blood per rectum. Patient has no abdominal pain, no drop in hemoglobin compared to prior CBC on 12/13/2014, no signs of blood loss. Attempts at proper rectal exam were denied by patient as she would not allow me to evaluate any further. GI was consult and who reported patient required hospitalization with follow-up tomorrow by their service. He instructed to hold off on any scans or studies until he could evaluate the patient. General surgery was consult and was aware of the patient and available for consult. Patient appeared in no acute distress, physical, laboratory and vital signs reassuring. Patient admitted to the hospital without complication         Okey Regal, PA-C 01/06/15 0154  Okey Regal, PA-C 01/06/15 [id number]  Gareth Morgan, MD 01/06/15 1259

## 2015-01-05 NOTE — ED Notes (Signed)
Bed: [id number] Expected date:  Expected time:  Means of arrival:  Comments: tr3

## 2015-01-06 ENCOUNTER — Encounter (HOSPITAL_COMMUNITY): Payer: Self-pay | Admitting: *Deleted

## 2015-01-06 ENCOUNTER — Observation Stay (HOSPITAL_COMMUNITY): Payer: Medicare Other

## 2015-01-06 ENCOUNTER — Encounter (HOSPITAL_COMMUNITY)
Admission: EM | Disposition: A | Discharge status: Home / Self Care | Payer: Medicare Other | Source: Home / Self Care | Attending: Internal Medicine

## 2015-01-06 DIAGNOSIS — K625 Hemorrhage of anus and rectum: Secondary | ICD-10-CM

## 2015-01-06 DIAGNOSIS — C189 Malignant neoplasm of colon, unspecified: Secondary | ICD-10-CM | POA: Insufficient documentation

## 2015-01-06 DIAGNOSIS — K648 Other hemorrhoids: Secondary | ICD-10-CM

## 2015-01-06 DIAGNOSIS — K922 Gastrointestinal hemorrhage, unspecified: Secondary | ICD-10-CM

## 2015-01-06 DIAGNOSIS — R970 Elevated carcinoembryonic antigen [CEA]: Secondary | ICD-10-CM

## 2015-01-06 HISTORY — PX: FLEXIBLE SIGMOIDOSCOPY: SHX5431

## 2015-01-06 LAB — CBC
HEMATOCRIT: 36.7 % (ref 36.0–46.0)
HEMOGLOBIN: 12.5 g/dL (ref 12.0–15.0)
MCH: 33.8 pg (ref 26.0–34.0)
MCHC: 34.1 g/dL (ref 30.0–36.0)
MCV: 99.2 fL (ref 78.0–100.0)
Platelets: 91 10*3/uL — ABNORMAL LOW (ref 150–400)
RBC: 3.7 MIL/uL — AB (ref 3.87–5.11)
RDW: 12.7 % (ref 11.5–15.5)
WBC: 6.8 10*3/uL (ref 4.0–10.5)

## 2015-01-06 LAB — COMPREHENSIVE METABOLIC PANEL
ALT: 26 U/L (ref 14–54)
AST: 35 U/L (ref 15–41)
Albumin: 4.5 g/dL (ref 3.5–5.0)
Alkaline Phosphatase: 133 U/L — ABNORMAL HIGH (ref 38–126)
Anion gap: 8 (ref 5–15)
BILIRUBIN TOTAL: 1 mg/dL (ref 0.3–1.2)
BUN: 21 mg/dL — AB (ref 6–20)
CO2: 23 mmol/L (ref 22–32)
CREATININE: 0.9 mg/dL (ref 0.44–1.00)
Calcium: 9.8 mg/dL (ref 8.9–10.3)
Chloride: 106 mmol/L (ref 101–111)
GFR calc Af Amer: >60 mL/min (ref >60)
GFR, EST NON AFRICAN AMERICAN: 58 mL/min — AB (ref >60)
Glucose, Bld: 110 mg/dL — ABNORMAL HIGH (ref 65–99)
Potassium: 4.1 mmol/L (ref 3.5–5.1)
Sodium: 137 mmol/L (ref 135–145)
TOTAL PROTEIN: 7.6 g/dL (ref 6.5–8.1)

## 2015-01-06 LAB — ABO/RH: ABO/RH(D): A POS

## 2015-01-06 SURGERY — SIGMOIDOSCOPY, FLEXIBLE
Anesthesia: Moderate Sedation

## 2015-01-06 MED ORDER — SODIUM CHLORIDE 0.9 % IV SOLN
INTRAVENOUS | Status: AC
  Administered 2015-01-06: 03:00:00 via INTRAVENOUS

## 2015-01-06 MED ORDER — MORPHINE SULFATE (PF) 2 MG/ML IV SOLN: 2.0000 mg | INTRAVENOUS | Status: DC | PRN

## 2015-01-06 MED ORDER — MIDAZOLAM HCL 5 MG/ML IJ SOLN
INTRAMUSCULAR | Status: AC
  Filled 2015-01-06: qty 2

## 2015-01-06 MED ORDER — FENTANYL CITRATE (PF) 100 MCG/2ML IJ SOLN
INTRAMUSCULAR | Status: AC
  Filled 2015-01-06: qty 2

## 2015-01-06 MED ORDER — ACETAMINOPHEN 650 MG RE SUPP: 650.0000 mg | Freq: Four times a day (QID) | RECTAL | Status: DC | PRN

## 2015-01-06 MED ORDER — HYDRALAZINE HCL 25 MG PO TABS: 25.0000 mg | ORAL_TABLET | Freq: Four times a day (QID) | ORAL | Status: DC | PRN

## 2015-01-06 MED ORDER — MIDAZOLAM HCL 10 MG/2ML IJ SOLN
INTRAMUSCULAR | Status: DC | PRN
  Administered 2015-01-06: 2 mg via INTRAVENOUS
  Administered 2015-01-06: 1 mg via INTRAVENOUS

## 2015-01-06 MED ORDER — SODIUM CHLORIDE 0.9 % IV SOLN: INTRAVENOUS | Status: DC

## 2015-01-06 MED ORDER — SODIUM CHLORIDE 0.9 % IV SOLN
INTRAVENOUS | Status: DC
  Administered 2015-01-06 – 2015-01-07 (×4): via INTRAVENOUS

## 2015-01-06 MED ORDER — IOHEXOL 300 MG/ML  SOLN
25.0000 mL | INTRAMUSCULAR | Status: AC
  Administered 2015-01-06 (×2): 25 mL via ORAL

## 2015-01-06 MED ORDER — ACETAMINOPHEN 325 MG PO TABS
650.0000 mg | ORAL_TABLET | Freq: Four times a day (QID) | ORAL | Status: DC | PRN
  Administered 2015-01-07: 650 mg via ORAL
  Filled 2015-01-06: qty 2

## 2015-01-06 MED ORDER — HYDRALAZINE HCL 20 MG/ML IJ SOLN: 5.0000 mg | Freq: Four times a day (QID) | INTRAMUSCULAR | Status: DC | PRN

## 2015-01-06 MED ORDER — IOHEXOL 300 MG/ML  SOLN
100.0000 mL | Freq: Once | INTRAMUSCULAR | Status: AC | PRN
  Administered 2015-01-06: 100 mL via INTRAVENOUS

## 2015-01-06 MED ORDER — FENTANYL CITRATE (PF) 100 MCG/2ML IJ SOLN
INTRAMUSCULAR | Status: DC | PRN
  Administered 2015-01-06 (×3): 25 ug via INTRAVENOUS

## 2015-01-06 NOTE — H&P (Signed)
PCP:   Volanda Napoleon, MD   Chief Complaint:  Rectal bleeding  HPI:  81 year old female who  has a past medical history of HTN (hypertension) (August 2014); Syncope and collapse (August 2014); Colitis; Breast cancer ([place]); Colon cancer ([place]) (02/21/2013); and Sinus arrest (08/28/2012). Today presents to the hospital with two-day history of rectal bleeding. Patient has a history of metastatic colon cancer, status post 3 cycles of FOLFOX chemotherapy which was stopped due to side effects in 2014. Patient has a colostomy bag in place, she underwent surgery for colon cancer in 2014. 2 days ago patient noticed fecal urgency and passed some blood by rectum. Yesterday patient again noticed pressure sensation in the rectum and passed significant amount of blood clots. She denies abdominal pain. No nausea vomiting. Patient empties colostomy bag several times a day and has not noticed any blood in the colostomy bag. Review of the old records from 2014 showed both internal and external hemorrhoids on flexible sigmoidoscopy as well as colonoscopy. Patient denies any chest pain, no shortness of breath. No syncope. In the ED lab work showed hemoglobin of 12.1  Allergies:   Allergies  Allergen Reactions  . Codeine Nausea And Vomiting  . Zofran [Ondansetron Hcl] Nausea And Vomiting  . Ciprofloxacin     Confusion/dizziness/fatigue  . Metronidazole     Confusion/dizziness/fatigue  . Skelaxin [Metaxalone]     Too strong  . Tetracycline Hcl     Not tolerated  . Avelox [Moxifloxacin] Rash      Past Medical History  Diagnosis Date  . HTN (hypertension) August 2014  . Syncope and collapse August 2014    sinus pauses/ PTVDP  . Colitis   . Breast cancer ([place])   . Colon cancer ([place]) 02/21/2013  . Sinus arrest 08/28/2012    Past Surgical History  Procedure Laterality Date  . Breast lumpectomy    . Cholecystectomy    . Tonsillectomy    . Pacemaker insertion  08/28/12    MDT  . Flexible  sigmoidoscopy N/A 12/06/2012    Procedure: FLEXIBLE SIGMOIDOSCOPY;  Surgeon: Lear Ng, MD;  Location: [place];  Service: Endoscopy;  Laterality: N/A;  . Laparoscopic partial colectomy N/A 12/07/2012    Procedure: LAPAROSCOPIC-ASSISTED TRANSVERSE COLECTOMY;  Surgeon: Joyice Faster. Cornett, MD;  Location: [place];  Service: General;  Laterality: N/A;  . Colonoscopy N/A 12/15/2012    Procedure: COLONOSCOPY;  Surgeon: Jeryl Columbia, MD;  Location: [facility] ENDOSCOPY;  Service: Endoscopy;  Laterality: N/A;  . Bowel decompression N/A 12/15/2012    Procedure: BOWEL DECOMPRESSION;  Surgeon: Jeryl Columbia, MD;  Location: [facility] ENDOSCOPY;  Service: Endoscopy;  Laterality: N/A;  . Colon resection N/A 12/22/2012    Procedure: EXPLORATORY LAPAROTOMY , RIGHT Colectomy IILEOSTOMY;  Surgeon: Rolm Bookbinder, MD;  Location: [place];  Service: General;  Laterality: N/A;  . Left heart catheterization with coronary angiogram Bilateral 08/26/2012    Procedure: LEFT HEART CATHETERIZATION WITH CORONARY ANGIOGRAM;  Surgeon: Lorretta Harp, MD;  Location: [facility] CATH LAB;  Service: Cardiovascular;  Laterality: Bilateral;  . Permanent pacemaker insertion N/A 08/28/2012    Procedure: PERMANENT PACEMAKER INSERTION;  Surgeon: Sanda Klein, MD;  Location: [place] CATH LAB;  Service: Cardiovascular;  Laterality: N/A;    Prior to Admission medications   Medication Sig Start Date End Date Taking? Authorizing Provider  antiseptic oral rinse (BIOTENE) LIQD 15 mLs by Mouth Rinse route 5 (five) times daily. After brushing teeth   Yes Historical Provider, MD  b complex vitamins  tablet Take 1 tablet by mouth every other day.   Yes Historical Provider, MD  B Complex-C (SUPER B COMPLEX PO) Take 1 tablet by mouth every other day. Alternating days with B complex   Yes Historical Provider, MD  Biotin 5 MG TABS Take 1 tablet by mouth every morning.    Yes Historical Provider, MD  Calcium-Magnesium-Vitamin D (CALCIUM MAGNESIUM PO) Take 1 tablet by  mouth daily.   Yes Historical Provider, MD  Cholecalciferol (VITAMIN D) 2000 UNITS tablet Take 2,000 Units by mouth daily with breakfast.   Yes Historical Provider, MD  Dentifrices (BIOTENE DRY MOUTH) PSTE Place 1 application onto teeth 5 (five) times daily.   Yes Historical Provider, MD  diphenoxylate-atropine (LOMOTIL) 2.5-0.025 MG tablet take 2 tablets by mouth four times a day 11/19/14  Yes Eliezer Bottom, NP  feeding supplement, ENSURE COMPLETE, (ENSURE COMPLETE) LIQD Take 237 mLs by mouth 2 (two) times daily between meals. [name]* 01/01/13  Yes Josetta Huddle, MD  lidocaine-prilocaine (EMLA) cream Apply 1 application topically as needed. Apply to port site one hour before access 05/13/14  Yes Volanda Napoleon, MD  Multiple Vitamin (MULTIVITAMIN WITH MINERALS) TABS tablet Take 1 tablet by mouth daily.   Yes Historical Provider, MD  Multiple Vitamins-Minerals (ANTIOXIDANT VITAMINS PO) Take 1 tablet by mouth daily.   Yes Historical Provider, MD  Omega 3 1000 MG CAPS Take 1 capsule by mouth daily with breakfast.    Yes Historical Provider, MD  Probiotic Product (ALIGN PO) Take 1 capsule by mouth daily with breakfast.    Yes Historical Provider, MD  saccharomyces boulardii (FLORASTOR) 250 MG capsule Take 1 capsule (250 mg total) by mouth 2 (two) times daily. 04/18/14  Yes Costin Karlyne Greenspan, MD  UNABLE TO FIND Take by mouth every morning. OIL OF PRIMROSE   Yes Historical Provider, MD  Alum & Mag Hydroxide-Simeth (MAGIC MOUTHWASH) SOLN Take 5 mLs by mouth 3 (three) times daily as needed for mouth pain (swish and spit). Patient not taking: Reported on 01/05/2015 07/19/14   Eliezer Bottom, NP  dexamethasone (DECADRON) 4 MG tablet Take 2 tablets (8 mg total) by mouth 2 (two) times daily with a meal. Start the day after chemotherapy for 2 days. Take with food. Patient not taking: Reported on 01/05/2015 05/22/14   Volanda Napoleon, MD  prochlorperazine (COMPAZINE) 10 MG tablet Take 1 tablet (10 mg total)  by mouth every 6 (six) hours as needed (Nausea or vomiting). Patient not taking: Reported on 01/05/2015 05/22/14   Volanda Napoleon, MD    Social History:  reports that she has never smoked. She has never used smokeless tobacco. She reports that she does not drink alcohol or use illicit drugs.  Family History  Problem Relation Age of Onset  . Alzheimer's disease Mother   . Renal Disease Father   . Coronary artery disease Brother     Filed Weights   01/05/15 2007  Weight: 58.06 kg (128 lb)    All the positives are listed in BOLD  Review of Systems:  HEENT: Headache, blurred vision, runny nose, sore throat Neck: Hypothyroidism, hyperthyroidism,,lymphadenopathy Chest : Shortness of breath, history of COPD, Asthma Heart : Chest pain, history of coronary arterey disease GI:  Nausea, vomiting, diarrhea, constipation, GERD GU: Dysuria, urgency, frequency of urination, hematuria Neuro: Stroke, seizures, syncope Psych: Depression, anxiety, hallucinations   Physical Exam: Blood pressure 174/74, pulse 71, temperature 97.8 F (36.6 C), temperature source Oral, resp. rate 12, height 5\' 6"  (1.676  m), weight 58.06 kg (128 lb), SpO2 97 %. Constitutional:   Patient is a well-developed and well-nourished female* in no acute distress and cooperative with exam. Head: Normocephalic and atraumatic Mouth: Mucus membranes moist Eyes: PERRL, EOMI, conjunctivae normal Neck: Supple, No Thyromegaly Cardiovascular: RRR, S1 normal, S2 normal Pulmonary/Chest: CTAB, no wheezes, rales, or rhonchi Abdominal: Soft. Non-tender, non-distended, colostomy bag in place, bowel sounds are normal, no masses, organomegaly, or guarding present.  Neurological: A&O x3, Strength is normal and symmetric bilaterally, cranial nerve II-XII are grossly intact, no focal motor deficit, sensory intact to light touch bilaterally.  Extremities : No Cyanosis, Clubbing or Edema  Labs on Admission:  Basic Metabolic Panel:  Recent  Labs Lab 01/05/15 2040  NA 138  K 4.5  CL 107  CO2 22  GLUCOSE 116*  BUN 31*  CREATININE 0.99  CALCIUM 10.0   Liver Function Tests:  Recent Labs Lab 01/05/15 2040  AST 37  ALT 27  ALKPHOS 135*  BILITOT 0.7  PROT 7.7  ALBUMIN 4.6   CBC:  Recent Labs Lab 01/05/15 2040  WBC 6.0  HGB 12.1  HCT 36.0  MCV 101.7*  PLT 98*       Assessment/Plan Active Problems:   Colitis   GI bleed   Internal hemorrhoids without complication   Ileostomy in place [facility])   Rectal bleed   Lower GI bleeding  Rectal bleeding Patient presenting with rectal bleeding, no bleeding in the hospital,  hemoglobin is stable. Will check CBC in a.m. GI Dr. Hilarie Fredrickson  and surgery Dr Johney Maine  were consulted by the ED physician. They will follow the patient in a.m. Patient has history of both internal and external hemorrhoids noted on the flexible sigmoidoscopy and colonoscopy in 2014 Will keep patient nothing by mouth for possible flexible sigmoidoscopy in a.m.  History of metastatic colon cancer Patient is currently not getting chemotherapy Followed by Dr. Marin Olp as outpatient  DVT prophylaxis SCDs  Family discussion: No family present at bedside   Time Spent on Admission: 60 min was  [facility] Pager: [phone] 01/06/2015, 12:27 AM  If 7PM-7AM, please contact night-coverage  [email or web address]  Password [email or web address]

## 2015-01-06 NOTE — ED Notes (Addendum)
Cancel cardiac monitoring per Dr Darrick Meigs

## 2015-01-06 NOTE — Consult Note (Signed)
Referral MD  Reason for Referral: Metastatic colon cancer with rectal bleeding   Chief Complaint  Patient presents with  . Rectal Bleeding  : I started bleeding from my rectum.  HPI: Ms. Tamara [name] is well-known to me. She is an 81 year old white female with metastatic colon cancer. She was diagnosed with an increasing CEA.  We try her on systemic chemotherapy but she tolerated this very poorly. She has not had any chemotherapy now probably for about 4-5 months.  She was doing well. She has a colostomy. She is doing with this nicely. She's had no problems with the colostomy.  I last saw her probably couple weeks ago. I noted that her CEA was climbing. It went from 14 back in August up to 65. She was asymptomatic so I do not think that we had to do any scans either time.  She started to have some blood per abdomen and not out of her colostomy over the weekend. This evening it worse.  When she came in, her labs looked pretty decent. Hemoglobin was 12.1. Clinic and was 98,000. Liver tests were okay although the alkaline phosphatase was up just a little bit.  She's had no cough. There's been no nausea or vomiting. She's had no obvious weight loss. She's had no leg swelling.  She was admitted by the hospitalist. I very much appreciate their outstanding care.     Past Medical History  Diagnosis Date  . HTN (hypertension) August 2014  . Syncope and collapse August 2014    sinus pauses/ PTVDP  . Colitis   . Breast cancer ([facility])   . Colon cancer ([place]) 02/21/2013  . Sinus arrest 08/28/2012  :  Past Surgical History  Procedure Laterality Date  . Breast lumpectomy    . Cholecystectomy    . Tonsillectomy    . Pacemaker insertion  08/28/12    MDT  . Flexible sigmoidoscopy N/A 12/06/2012    Procedure: FLEXIBLE SIGMOIDOSCOPY;  Surgeon: Lear Ng, MD;  Location: [facility];  Service: Endoscopy;  Laterality: N/A;  . Laparoscopic partial colectomy N/A 12/07/2012    Procedure:  LAPAROSCOPIC-ASSISTED TRANSVERSE COLECTOMY;  Surgeon: Joyice Faster. Cornett, MD;  Location: [facility];  Service: General;  Laterality: N/A;  . Colonoscopy N/A 12/15/2012    Procedure: COLONOSCOPY;  Surgeon: Jeryl Columbia, MD;  Location[facility] ENDOSCOPY;  Service: Endoscopy;  Laterality: N/A;  . Bowel decompression N/A 12/15/2012    Procedure: BOWEL DECOMPRESSION;  Surgeon: Jeryl Columbia, MD;  Location: [facility] ENDOSCOPY;  Service: Endoscopy;  Laterality: N/A;  . Colon resection N/A 12/22/2012    Procedure: EXPLORATORY LAPAROTOMY , RIGHT Colectomy IILEOSTOMY;  Surgeon: Rolm Bookbinder, MD;  Location: [place];  Service: General;  Laterality: N/A;  . Left heart catheterization with coronary angiogram Bilateral 08/26/2012    Procedure: LEFT HEART CATHETERIZATION WITH CORONARY ANGIOGRAM;  Surgeon: Lorretta Harp, MD;  Location: [facility] CATH LAB;  Service: Cardiovascular;  Laterality: Bilateral;  . Permanent pacemaker insertion N/A 08/28/2012    Procedure: PERMANENT PACEMAKER INSERTION;  Surgeon: Sanda Klein, MD;  Location: [facility] CATH LAB;  Service: Cardiovascular;  Laterality: N/A;  :   Current facility-administered medications:  .  0.9 %  sodium chloride infusion, , Intravenous, Continuous, Oswald Hillock, MD, Last Rate: 50 mL/hr at 01/06/15 0400 .  acetaminophen (TYLENOL) tablet 650 mg, 650 mg, Oral, Q6H PRN **OR** acetaminophen (TYLENOL) suppository 650 mg, 650 mg, Rectal, Q6H PRN, Oswald Hillock, MD .  hydrALAZINE (APRESOLINE) injection 5 mg, 5 mg, Intravenous,  Q6H PRN, Hewitt Shorts [place], PA-C .  morphine 2 MG/ML injection 2 mg, 2 mg, Intravenous, Q4H PRN, Oswald Hillock, MD:   :  Allergies  Allergen Reactions  . Codeine Nausea And Vomiting  . Zofran [Ondansetron Hcl] Nausea And Vomiting  . Ciprofloxacin     Confusion/dizziness/fatigue  . Metronidazole     Confusion/dizziness/fatigue  . Skelaxin [Metaxalone]     Too strong  . Tetracycline Hcl     Not tolerated  . Avelox [Moxifloxacin] Rash  :  Family History   Problem Relation Age of Onset  . Alzheimer's disease Mother   . Renal Disease Father   . Coronary artery disease Brother   :  Social History   Social History  . Marital Status: Married    Spouse Name: N/A  . Number of Children: N/A  . Years of Education: N/A   Occupational History  . Not on file.   Social History Main Topics  . Smoking status: Never Smoker   . Smokeless tobacco: Never Used     Comment: never used tobacco  . Alcohol Use: No  . Drug Use: No  . Sexual Activity: Not on file   Other Topics Concern  . Not on file   Social History Narrative  :  Pertinent items are noted in HPI.  Exam: Patient Vitals for the past 24 hrs:  BP Temp Temp src Pulse Resp SpO2 Height Weight  01/06/15 0440 (!) 154/76 mmHg - - - - - - -  01/06/15 0413 (!) 181/89 mmHg 98 F (36.7 C) Oral 72 16 100 % - -  01/06/15 0246 (!) 176/56 mmHg - - - - - - -  01/06/15 0219 (!) 190/70 mmHg 98.2 F (36.8 C) - 83 18 100 % 5\' 6"  (1.676 m) 122 lb 9.2 oz (55.6 kg)  01/05/15 2330 165/70 mmHg - - 73 13 97 % - -  01/05/15 2315 151/77 mmHg - - 76 12 97 % - -  01/05/15 2300 162/70 mmHg - - 69 13 97 % - -  01/05/15 2245 174/74 mmHg - - 71 12 97 % - -  01/05/15 2230 (!) 151/133 mmHg - - 95 (!) 32 100 % - -  01/05/15 2130 187/69 mmHg - - 69 13 99 % - -  01/05/15 2115 (!) 207/87 mmHg - - 80 17 100 % - -  01/05/15 2100 186/71 mmHg - - 70 13 99 % - -  01/05/15 2055 165/83 mmHg - - 69 18 99 % - -  01/05/15 2007 - - - - - - 5\' 6"  (1.676 m) 128 lb (58.06 kg)  01/05/15 1947 177/95 mmHg 97.8 F (36.6 C) Oral 88 18 98 % - -   As above    Recent Labs  01/05/15 2040 01/06/15 0649  WBC 6.0 6.8  HGB 12.1 12.5  HCT 36.0 36.7  PLT 98* 91*    Recent Labs  01/05/15 2040 01/06/15 0649  NA 138 137  K 4.5 4.1  CL 107 106  CO2 22 23  GLUCOSE 116* 110*  BUN 31* 21*  CREATININE 0.99 0.90  CALCIUM 10.0 9.8    Blood smear review:  None  Pathology: None     Assessment and Plan: Tamara [name] is a  81 year old white female with metastatic colon cancer. She has been asymptomatic. She's done quite well without having any therapy. She tolerated therapy incredibly poorly. As such, we stopped.  I'm convinced that given the new symptoms, and the rising  CEA, that this is also high related to metastatic disease.  I'll order scans on her.  I think gastroenterology will see her. They probably will scope her through the colostomy. I think this makes sense.  I suppose she might have diverticulosis. I suppose that she may have internal hemorrhoids. These certainly would be much easier to deal with.  If she does have obvious metastatic disease, then we will have to decide as so we'll chemotherapy she might feel to tolerate. If she has recurrence down in the pelvis, we may consider radiation treatments.  I spent about 35-40 minutes with her.  Again, I appreciate all the great care that she is getting up on 3 W.  Pete E

## 2015-01-06 NOTE — Op Note (Signed)
[facility] [place], [address]   FLEXIBLE SIGMOIDOSCOPY PROCEDURE REPORT  PATIENT: Tamara [name], Tamara [name]  MR#: [id number] BIRTHDATE: 11-May-1929 , 61  yrs. old GENDER: female ENDOSCOPIST: Wilford Corner, MD REFERRED BY: hospital team PROCEDURE DATE:  01/06/2015 PROCEDURE:   Sigmoidoscopy, diagnostic ASA CLASS:   Class III INDICATIONS:hematochezia. MEDICATIONS: Fentanyl 75 mcg IV and Versed 3 mg IV  DESCRIPTION OF PROCEDURE:   After the risks benefits and alternatives of the procedure were thoroughly explained, informed consent was obtained.     The     endoscope was introduced through the anus  and advanced to the rectosigmoid colon (20 cm from the anus (20 cm into the Hartmann's pouch).      , The exam was Without limitations.    The quality of the prep was none      . Estimated blood loss is zero unless otherwise noted in this procedure report. The instrument was then slowly withdrawn as the mucosa was fully examined.       Ileoscopy completed and no active bleeding seen in the examined ileum (extent reached 20 cm from stoma). Ileoscopy terminated at this point due to discomfort and lack of bleeding seen. A endoscope was used due to inability of the pediatric colonoscope to traverse the ileostomy. The endoscope was then withdrawn from the ileostomy and was inserted into the rectum. Anorectum was narrowed and tender during intubation of the rectum. Large amount of maroon-colored blood in the rectum that could not be irrigated away. Endoscope advanced to 20 cm from the anus where a large ulcerated mass was seen that was friable. Proximal to this area diverticulae were noted and due to patient discomfort with insufflation of the colon and evidence of the mass the procedure was terminated. Retroflexion was not performed due to a narrow rectal vault.    The scope was then withdrawn from the patient and the  procedure terminated.  COMPLICATIONS: There were no immediate complications.  ENDOSCOPIC IMPRESSION: Colon mass in Hartmann's pouch with active bleeding Normal examined ileum  RECOMMENDATIONS: Supportive care; Oncology to decide on retry of chemo/radiation   REPEAT EXAM: N/A  eSigned:  Wilford Corner, MD 01/06/2015 5:09 PM   [id number] Marin Olp, MD  PATIENT NAME:  Tamara [name], Tamara [name] MR#: [id number]

## 2015-01-06 NOTE — Brief Op Note (Signed)
Tumor seen in distal colon with active bleeding. No blood or bleeding seen in ileostomy. Supportive care. May benefit from radiation treatment defer to oncology.

## 2015-01-06 NOTE — Progress Notes (Addendum)
[facility] Progress Note   Tamara [name]  [id number]  DOB: [birth year]  DOA: 01/05/2015 PCP: Volanda Napoleon, MD  Brief narrative: Tamara [name] is a 81 y.o. female with colon cancer who did not tolerated chemo due to side effects and has an ileostomy, PAT, pacemaker. She presents for rectal bleeding- 2 episodes yesterday. No abdominal pain.    Subjective: No further bleeding.   Assessment/Plan: Principal Problem:   Rectal bleeding- h/o colon CA 2014 - h/o hemorroids - GI ([facility]) consulted- Dr Marin Olp has seen her today- notes CEA was climbing - NPO for now - no drop in Hb yet- no on anticoagulation as outpt  Active Problems: Dehydration - cont IVF    Ileostomy in place - has not had any blood in stool   Dual chamber pacer/ PAT - follows regularly with Dr Sallyanne Kuster   Chronic thrombocytopenia - follow     Code Status:     Code Status Orders        Start     Ordered   01/06/15 0203  Full code   Continuous     01/06/15 0203    Advance Directive Documentation        Most Recent Value   Type of Advance Directive  Living will, Healthcare Power of Attorney   Pre-existing out of facility DNR order (yellow form or pink MOST form)     "MOST" Form in Place?       Family Communication:  Disposition Plan: f/u for further bleeding DVT prophylaxis: SCDs Consultants:GI, oncology Procedures:  Antibiotics: Anti-infectives    None      Objective: Filed Weights   01/05/15 2007 01/06/15 0219  Weight: 58.06 kg (128 lb) 55.6 kg (122 lb 9.2 oz)    Intake/Output Summary (Last 24 hours) at 01/06/15 1340 Last data filed at 01/06/15 0700  Gross per 24 hour  Intake    150 ml  Output      0 ml  Net    150 ml     Vitals Filed Vitals:   01/06/15 0246 01/06/15 0413 01/06/15 0440 01/06/15 1318  BP: 176/56 181/89 154/76 181/71  Pulse:  72  71  Temp:  98 F (36.7 C)  98.6 F (37 C)  TempSrc:  Oral  Oral  Resp:  16  16  Height:      Weight:       SpO2:  100%  99%    Exam:  General:  Pt is alert, not in acute distress  HEENT: No icterus, No thrush, oral mucosa moist  Cardiovascular: regular rate and rhythm, S1/S2 No murmur  Respiratory: clear to auscultation bilaterally   Abdomen: Soft, +Bowel sounds, non tender, non distended, no guarding  MSK: No LE edema, cyanosis or clubbing  Data Reviewed: Basic Metabolic Panel:  Recent Labs Lab 01/05/15 2040 01/06/15 0649  NA 138 137  K 4.5 4.1  CL 107 106  CO2 22 23  GLUCOSE 116* 110*  BUN 31* 21*  CREATININE 0.99 0.90  CALCIUM 10.0 9.8   Liver Function Tests:  Recent Labs Lab 01/05/15 2040 01/06/15 0649  AST 37 35  ALT 27 26  ALKPHOS 135* 133*  BILITOT 0.7 1.0  PROT 7.7 7.6  ALBUMIN 4.6 4.5   No results for input(s): LIPASE, AMYLASE in the last 168 hours. No results for input(s): AMMONIA in the last 168 hours. CBC:  Recent Labs Lab 01/05/15 2040 01/06/15 0649  WBC 6.0 6.8  HGB 12.1 12.5  HCT 36.0  36.7  MCV 101.7* 99.2  PLT 98* 91*   Cardiac Enzymes: No results for input(s): CKTOTAL, CKMB, CKMBINDEX, TROPONINI in the last 168 hours. BNP (last 3 results) No results for input(s): BNP in the last 8760 hours.  ProBNP (last 3 results) No results for input(s): PROBNP in the last 8760 hours.  CBG: No results for input(s): GLUCAP in the last 168 hours.  No results found for this or any previous visit (from the past 240 hour(s)).   Studies: No results found.  Scheduled Meds:  Scheduled Meds:  Continuous Infusions: . sodium chloride 50 mL/hr at 01/06/15 0400  . sodium chloride      Time spent on care of this patient: 35 min   [place], MD 01/06/2015, 1:40 PM  LOS: 1 day   [facility] Office  [phone] Pager - Text Page per [email or web address] If 7PM-7AM, please contact night-coverage [email or web address]

## 2015-01-06 NOTE — Interval H&P Note (Signed)
History and Physical Interval Note:  01/06/2015 4:12 PM  Tamara [name]  has presented today for surgery, with the diagnosis of rectal bleeding, colon cancer  The various methods of treatment have been discussed with the patient and family. After consideration of risks, benefits and other options for treatment, the patient has consented to  Procedure(s): FLEXIBLE SIGMOIDOSCOPY (N/A) as a surgical intervention .  The patient's history has been reviewed, patient examined, no change in status, stable for surgery.  I have reviewed the patient's chart and labs.  Questions were answered to the patient's satisfaction.     [place] C.

## 2015-01-06 NOTE — H&P (View-Only) (Signed)
Referring Provider: Dr. Wynelle Cleveland Primary Care Physician:  Volanda Napoleon, MD Primary Gastroenterologist:  Dr. Michail Sermon  Reason for Consultation:  GI bleed  HPI: Tamara [name] is a 81 y.o. female with metastatic colon cancer diagnosed in 2014 and has an end ileostomy with a Hartmann's pouch who had the acute onset of red blood with clots from her rectum. Denies any bleeding from her ileostomy. Denies abdominal pain, N/V/dizziness. Not on chemotherapy due to intolerance. Hgb 12.5.  Past Medical History  Diagnosis Date  . HTN (hypertension) August 2014  . Syncope and collapse August 2014    sinus pauses/ PTVDP  . Colitis   . Sinus arrest 08/28/2012  . Breast cancer ([place])   . Colon cancer ([place]) 02/21/2013    Past Surgical History  Procedure Laterality Date  . Breast lumpectomy    . Cholecystectomy    . Tonsillectomy    . Pacemaker insertion  08/28/12    MDT  . Flexible sigmoidoscopy N/A 12/06/2012    Procedure: FLEXIBLE SIGMOIDOSCOPY;  Surgeon: Lear Ng, MD;  Location: [place];  Service: Endoscopy;  Laterality: N/A;  . Laparoscopic partial colectomy N/A 12/07/2012    Procedure: LAPAROSCOPIC-ASSISTED TRANSVERSE COLECTOMY;  Surgeon: Joyice Faster. Cornett, MD;  Location: [place];  Service: General;  Laterality: N/A;  . Colonoscopy N/A 12/15/2012    Procedure: COLONOSCOPY;  Surgeon: Jeryl Columbia, MD;  Location[facility] ENDOSCOPY;  Service: Endoscopy;  Laterality: N/A;  . Bowel decompression N/A 12/15/2012    Procedure: BOWEL DECOMPRESSION;  Surgeon: Jeryl Columbia, MD;  Location: [facility] ENDOSCOPY;  Service: Endoscopy;  Laterality: N/A;  . Colon resection N/A 12/22/2012    Procedure: EXPLORATORY LAPAROTOMY , RIGHT Colectomy IILEOSTOMY;  Surgeon: Rolm Bookbinder, MD;  Location: [place];  Service: General;  Laterality: N/A;  . Left heart catheterization with coronary angiogram Bilateral 08/26/2012    Procedure: LEFT HEART CATHETERIZATION WITH CORONARY ANGIOGRAM;  Surgeon: Lorretta Harp, MD;   Location: [facility] CATH LAB;  Service: Cardiovascular;  Laterality: Bilateral;  . Permanent pacemaker insertion N/A 08/28/2012    Procedure: PERMANENT PACEMAKER INSERTION;  Surgeon: Sanda Klein, MD;  Location: [place] CATH LAB;  Service: Cardiovascular;  Laterality: N/A;    Prior to Admission medications   Medication Sig Start Date End Date Taking? Authorizing Provider  antiseptic oral rinse (BIOTENE) LIQD 15 mLs by Mouth Rinse route 5 (five) times daily. After brushing teeth   Yes Historical Provider, MD  b complex vitamins tablet Take 1 tablet by mouth every other day.   Yes Historical Provider, MD  B Complex-C (SUPER B COMPLEX PO) Take 1 tablet by mouth every other day. Alternating days with B complex   Yes Historical Provider, MD  Biotin 5 MG TABS Take 1 tablet by mouth every morning.    Yes Historical Provider, MD  Calcium-Magnesium-Vitamin D (CALCIUM MAGNESIUM PO) Take 1 tablet by mouth daily.   Yes Historical Provider, MD  Cholecalciferol (VITAMIN D) 2000 UNITS tablet Take 2,000 Units by mouth daily with breakfast.   Yes Historical Provider, MD  Dentifrices (BIOTENE DRY MOUTH) PSTE Place 1 application onto teeth 5 (five) times daily.   Yes Historical Provider, MD  diphenoxylate-atropine (LOMOTIL) 2.5-0.025 MG tablet take 2 tablets by mouth four times a day 11/19/14  Yes Eliezer Bottom, NP  feeding supplement, ENSURE COMPLETE, (ENSURE COMPLETE) LIQD Take 237 mLs by mouth 2 (two) times daily between meals. [name]* 01/01/13  Yes Josetta Huddle, MD  lidocaine-prilocaine (EMLA) cream Apply 1 application topically as needed. Apply  to port site one hour before access 05/13/14  Yes Volanda Napoleon, MD  Multiple Vitamin (MULTIVITAMIN WITH MINERALS) TABS tablet Take 1 tablet by mouth daily.   Yes Historical Provider, MD  Multiple Vitamins-Minerals (ANTIOXIDANT VITAMINS PO) Take 1 tablet by mouth daily.   Yes Historical Provider, MD  Omega 3 1000 MG CAPS Take 1 capsule by mouth daily with breakfast.    Yes  Historical Provider, MD  Probiotic Product (ALIGN PO) Take 1 capsule by mouth daily with breakfast.    Yes Historical Provider, MD  saccharomyces boulardii (FLORASTOR) 250 MG capsule Take 1 capsule (250 mg total) by mouth 2 (two) times daily. 04/18/14  Yes Costin Karlyne Greenspan, MD  UNABLE TO FIND Take by mouth every morning. OIL OF PRIMROSE   Yes Historical Provider, MD  Alum & Mag Hydroxide-Simeth (MAGIC MOUTHWASH) SOLN Take 5 mLs by mouth 3 (three) times daily as needed for mouth pain (swish and spit). Patient not taking: Reported on 01/05/2015 07/19/14   Eliezer Bottom, NP  dexamethasone (DECADRON) 4 MG tablet Take 2 tablets (8 mg total) by mouth 2 (two) times daily with a meal. Start the day after chemotherapy for 2 days. Take with food. Patient not taking: Reported on 01/05/2015 05/22/14   Volanda Napoleon, MD  prochlorperazine (COMPAZINE) 10 MG tablet Take 1 tablet (10 mg total) by mouth every 6 (six) hours as needed (Nausea or vomiting). Patient not taking: Reported on 01/05/2015 05/22/14   Volanda Napoleon, MD    Scheduled Meds:  Continuous Infusions: . sodium chloride 50 mL/hr at 01/06/15 1353  . sodium chloride     PRN Meds:.[MAR Hold] acetaminophen **OR** [MAR Hold] acetaminophen, [MAR Hold] hydrALAZINE, [MAR Hold]  morphine injection  Allergies as of 01/05/2015 - Review Complete 01/05/2015  Allergen Reaction Noted  . Codeine Nausea And Vomiting 06/10/2011  . Zofran [ondansetron hcl] Nausea And Vomiting 12/10/2012  . Ciprofloxacin  04/18/2014  . Metronidazole  04/18/2014  . Skelaxin [metaxalone]  04/18/2014  . Tetracycline hcl  04/18/2014  . Avelox [moxifloxacin] Rash 04/18/2014    Family History  Problem Relation Age of Onset  . Alzheimer's disease Mother   . Renal Disease Father   . Coronary artery disease Brother     Social History   Social History  . Marital Status: Married    Spouse Name: N/A  . Number of Children: N/A  . Years of Education: N/A   Occupational  History  . Not on file.   Social History Main Topics  . Smoking status: Never Smoker   . Smokeless tobacco: Never Used     Comment: never used tobacco  . Alcohol Use: No  . Drug Use: No  . Sexual Activity: Not on file   Other Topics Concern  . Not on file   Social History Narrative    Review of Systems: All negative except as stated above in HPI.  Physical Exam: Vital signs: Filed Vitals:   01/06/15 1550 01/06/15 1555  BP: 235/135 226/81  Pulse: 87 85  Temp:    Resp: 19 16   Last BM Date: 01/06/15 General:   Elderly, Alert,  Well-developed, well-nourished, pleasant and cooperative in NAD HEENT: anicteric Neck: supple, nontender Lungs:  Clear throughout to auscultation.   No wheezes, crackles, or rhonchi. No acute distress. Heart:  Regular rate and rhythm; no murmurs, clicks, rubs,  or gallops. Abdomen: epigastric tenderness with guarding, soft, nondistended, +BS, ostomy intact with clear yellowish stool  Rectal:  Deferred Ext:  no edema  GI:  Lab Results:  Recent Labs  01/05/15 2040 01/06/15 0649  WBC 6.0 6.8  HGB 12.1 12.5  HCT 36.0 36.7  PLT 98* 91*   BMET  Recent Labs  01/05/15 2040 01/06/15 0649  NA 138 137  K 4.5 4.1  CL 107 106  CO2 22 23  GLUCOSE 116* 110*  BUN 31* 21*  CREATININE 0.99 0.90  CALCIUM 10.0 9.8   LFT  Recent Labs  01/06/15 0649  PROT 7.6  ALBUMIN 4.5  AST 35  ALT 26  ALKPHOS 133*  BILITOT 1.0   PT/INR  Recent Labs  01/05/15 2040  LABPROT 14.1  INR 1.07     Studies/Results: No results found.  Impression/Plan: Metastatic colon cancer with rectal bleeding likely due to tumor. No blood from ostomy but will do ileoscopy to look for any tumor infiltration in ileum and then look at rectal pouch. Continue supportive care. NPO.    LOS: 1 day   [place] C.  01/06/2015, 4:06 PM  Pager [phone]  If no answer or after 5 PM call [phone]

## 2015-01-06 NOTE — Progress Notes (Signed)
Patient went to bathroom and noticed blood clot when she wiped herself,small amount of clot noted.Pno blood in colostomy.

## 2015-01-06 NOTE — Consult Note (Signed)
Referring Provider: Dr. Wynelle Cleveland Primary Care Physician:  Volanda Napoleon, MD Primary Gastroenterologist:  Dr. Michail Sermon  Reason for Consultation:  GI bleed  HPI: Tamara [name] is a 81 y.o. female with metastatic colon cancer diagnosed in 2014 and has an end ileostomy with a Hartmann's pouch who had the acute onset of red blood with clots from her rectum. Denies any bleeding from her ileostomy. Denies abdominal pain, N/V/dizziness. Not on chemotherapy due to intolerance. Hgb 12.5.  Past Medical History  Diagnosis Date  . HTN (hypertension) August 2014  . Syncope and collapse August 2014    sinus pauses/ PTVDP  . Colitis   . Sinus arrest 08/28/2012  . Breast cancer ([place])   . Colon cancer ([place]) 02/21/2013    Past Surgical History  Procedure Laterality Date  . Breast lumpectomy    . Cholecystectomy    . Tonsillectomy    . Pacemaker insertion  08/28/12    MDT  . Flexible sigmoidoscopy N/A 12/06/2012    Procedure: FLEXIBLE SIGMOIDOSCOPY;  Surgeon: Lear Ng, MD;  Location: [facility];  Service: Endoscopy;  Laterality: N/A;  . Laparoscopic partial colectomy N/A 12/07/2012    Procedure: LAPAROSCOPIC-ASSISTED TRANSVERSE COLECTOMY;  Surgeon: Joyice Faster. Cornett, MD;  Location: [facility];  Service: General;  Laterality: N/A;  . Colonoscopy N/A 12/15/2012    Procedure: COLONOSCOPY;  Surgeon: Jeryl Columbia, MD;  Location: [facility] ENDOSCOPY;  Service: Endoscopy;  Laterality: N/A;  . Bowel decompression N/A 12/15/2012    Procedure: BOWEL DECOMPRESSION;  Surgeon: Jeryl Columbia, MD;  Location: [facility] ENDOSCOPY;  Service: Endoscopy;  Laterality: N/A;  . Colon resection N/A 12/22/2012    Procedure: EXPLORATORY LAPAROTOMY , RIGHT Colectomy IILEOSTOMY;  Surgeon: Rolm Bookbinder, MD;  Location: [facility];  Service: General;  Laterality: N/A;  . Left heart catheterization with coronary angiogram Bilateral 08/26/2012    Procedure: LEFT HEART CATHETERIZATION WITH CORONARY ANGIOGRAM;  Surgeon: Lorretta Harp, MD;   Location: [facility] CATH LAB;  Service: Cardiovascular;  Laterality: Bilateral;  . Permanent pacemaker insertion N/A 08/28/2012    Procedure: PERMANENT PACEMAKER INSERTION;  Surgeon: Sanda Klein, MD;  Location: [facility] CATH LAB;  Service: Cardiovascular;  Laterality: N/A;    Prior to Admission medications   Medication Sig Start Date End Date Taking? Authorizing Provider  antiseptic oral rinse (BIOTENE) LIQD 15 mLs by Mouth Rinse route 5 (five) times daily. After brushing teeth   Yes Historical Provider, MD  b complex vitamins tablet Take 1 tablet by mouth every other day.   Yes Historical Provider, MD  B Complex-C (SUPER B COMPLEX PO) Take 1 tablet by mouth every other day. Alternating days with B complex   Yes Historical Provider, MD  Biotin 5 MG TABS Take 1 tablet by mouth every morning.    Yes Historical Provider, MD  Calcium-Magnesium-Vitamin D (CALCIUM MAGNESIUM PO) Take 1 tablet by mouth daily.   Yes Historical Provider, MD  Cholecalciferol (VITAMIN D) 2000 UNITS tablet Take 2,000 Units by mouth daily with breakfast.   Yes Historical Provider, MD  Dentifrices (BIOTENE DRY MOUTH) PSTE Place 1 application onto teeth 5 (five) times daily.   Yes Historical Provider, MD  diphenoxylate-atropine (LOMOTIL) 2.5-0.025 MG tablet take 2 tablets by mouth four times a day 11/19/14  Yes Eliezer Bottom, NP  feeding supplement, ENSURE COMPLETE, (ENSURE COMPLETE) LIQD Take 237 mLs by mouth 2 (two) times daily between meals. [name]* 01/01/13  Yes Josetta Huddle, MD  lidocaine-prilocaine (EMLA) cream Apply 1 application topically as needed. Apply  to port site one hour before access 05/13/14  Yes Volanda Napoleon, MD  Multiple Vitamin (MULTIVITAMIN WITH MINERALS) TABS tablet Take 1 tablet by mouth daily.   Yes Historical Provider, MD  Multiple Vitamins-Minerals (ANTIOXIDANT VITAMINS PO) Take 1 tablet by mouth daily.   Yes Historical Provider, MD  Omega 3 1000 MG CAPS Take 1 capsule by mouth daily with breakfast.    Yes  Historical Provider, MD  Probiotic Product (ALIGN PO) Take 1 capsule by mouth daily with breakfast.    Yes Historical Provider, MD  saccharomyces boulardii (FLORASTOR) 250 MG capsule Take 1 capsule (250 mg total) by mouth 2 (two) times daily. 04/18/14  Yes Costin Karlyne Greenspan, MD  UNABLE TO FIND Take by mouth every morning. OIL OF PRIMROSE   Yes Historical Provider, MD  Alum & Mag Hydroxide-Simeth (MAGIC MOUTHWASH) SOLN Take 5 mLs by mouth 3 (three) times daily as needed for mouth pain (swish and spit). Patient not taking: Reported on 01/05/2015 07/19/14   Eliezer Bottom, NP  dexamethasone (DECADRON) 4 MG tablet Take 2 tablets (8 mg total) by mouth 2 (two) times daily with a meal. Start the day after chemotherapy for 2 days. Take with food. Patient not taking: Reported on 01/05/2015 05/22/14   Volanda Napoleon, MD  prochlorperazine (COMPAZINE) 10 MG tablet Take 1 tablet (10 mg total) by mouth every 6 (six) hours as needed (Nausea or vomiting). Patient not taking: Reported on 01/05/2015 05/22/14   Volanda Napoleon, MD    Scheduled Meds:  Continuous Infusions: . sodium chloride 50 mL/hr at 01/06/15 1353  . sodium chloride     PRN Meds:.[MAR Hold] acetaminophen **OR** [MAR Hold] acetaminophen, [MAR Hold] hydrALAZINE, [MAR Hold]  morphine injection  Allergies as of 01/05/2015 - Review Complete 01/05/2015  Allergen Reaction Noted  . Codeine Nausea And Vomiting 06/10/2011  . Zofran [ondansetron hcl] Nausea And Vomiting 12/10/2012  . Ciprofloxacin  04/18/2014  . Metronidazole  04/18/2014  . Skelaxin [metaxalone]  04/18/2014  . Tetracycline hcl  04/18/2014  . Avelox [moxifloxacin] Rash 04/18/2014    Family History  Problem Relation Age of Onset  . Alzheimer's disease Mother   . Renal Disease Father   . Coronary artery disease Brother     Social History   Social History  . Marital Status: Married    Spouse Name: N/A  . Number of Children: N/A  . Years of Education: N/A   Occupational  History  . Not on file.   Social History Main Topics  . Smoking status: Never Smoker   . Smokeless tobacco: Never Used     Comment: never used tobacco  . Alcohol Use: No  . Drug Use: No  . Sexual Activity: Not on file   Other Topics Concern  . Not on file   Social History Narrative    Review of Systems: All negative except as stated above in HPI.  Physical Exam: Vital signs: Filed Vitals:   01/06/15 1550 01/06/15 1555  BP: 235/135 226/81  Pulse: 87 85  Temp:    Resp: 19 16   Last BM Date: 01/06/15 General:   Elderly, Alert,  Well-developed, well-nourished, pleasant and cooperative in NAD HEENT: anicteric Neck: supple, nontender Lungs:  Clear throughout to auscultation.   No wheezes, crackles, or rhonchi. No acute distress. Heart:  Regular rate and rhythm; no murmurs, clicks, rubs,  or gallops. Abdomen: epigastric tenderness with guarding, soft, nondistended, +BS, ostomy intact with clear yellowish stool  Rectal:  Deferred Ext:  no edema  GI:  Lab Results:  Recent Labs  01/05/15 2040 01/06/15 0649  WBC 6.0 6.8  HGB 12.1 12.5  HCT 36.0 36.7  PLT 98* 91*   BMET  Recent Labs  01/05/15 2040 01/06/15 0649  NA 138 137  K 4.5 4.1  CL 107 106  CO2 22 23  GLUCOSE 116* 110*  BUN 31* 21*  CREATININE 0.99 0.90  CALCIUM 10.0 9.8   LFT  Recent Labs  01/06/15 0649  PROT 7.6  ALBUMIN 4.5  AST 35  ALT 26  ALKPHOS 133*  BILITOT 1.0   PT/INR  Recent Labs  01/05/15 2040  LABPROT 14.1  INR 1.07     Studies/Results: No results found.  Impression/Plan: Metastatic colon cancer with rectal bleeding likely due to tumor. No blood from ostomy but will do ileoscopy to look for any tumor infiltration in ileum and then look at rectal pouch. Continue supportive care. NPO.    LOS: 1 day   Tyrrell C.  01/06/2015, 4:06 PM  Pager [phone]  If no answer or after 5 PM call [phone]

## 2015-01-07 ENCOUNTER — Ambulatory Visit
Admit: 2015-01-07 | Discharge: 2015-01-07 | Disposition: A | Discharge status: Home / Self Care | Payer: Medicare Other | Attending: Radiation Oncology | Admitting: Radiation Oncology

## 2015-01-07 ENCOUNTER — Encounter (HOSPITAL_COMMUNITY): Payer: Self-pay | Admitting: Gastroenterology

## 2015-01-07 DIAGNOSIS — C787 Secondary malignant neoplasm of liver and intrahepatic bile duct: Secondary | ICD-10-CM

## 2015-01-07 DIAGNOSIS — R938 Abnormal findings on diagnostic imaging of other specified body structures: Secondary | ICD-10-CM

## 2015-01-07 DIAGNOSIS — C189 Malignant neoplasm of colon, unspecified: Secondary | ICD-10-CM

## 2015-01-07 DIAGNOSIS — C7800 Secondary malignant neoplasm of unspecified lung: Secondary | ICD-10-CM

## 2015-01-07 LAB — CEA: CEA: 169.3 ng/mL — AB (ref 0.0–4.7)

## 2015-01-07 MED ORDER — AMLODIPINE BESYLATE 5 MG PO TABS
5.0000 mg | ORAL_TABLET | Freq: Every day | ORAL | Status: DC
  Administered 2015-01-07 – 2015-01-08 (×2): 5 mg via ORAL
  Filled 2015-01-07 (×2): qty 1

## 2015-01-07 MED ORDER — DIPHENOXYLATE-ATROPINE 2.5-0.025 MG PO TABS
1.0000 | ORAL_TABLET | Freq: Two times a day (BID) | ORAL | Status: DC
  Administered 2015-01-07 – 2015-01-08 (×2): 1 via ORAL
  Filled 2015-01-07 (×2): qty 2
  Filled 2015-01-07: qty 1

## 2015-01-07 MED ORDER — DIPHENHYDRAMINE HCL 25 MG PO CAPS
25.0000 mg | ORAL_CAPSULE | Freq: Once | ORAL | Status: AC
  Administered 2015-01-07: 25 mg via ORAL
  Filled 2015-01-07: qty 1

## 2015-01-07 NOTE — Progress Notes (Signed)
I very much appreciate the outstanding care that she is getting from all the staff. She had her endoscopy yesterday. This clearly shows wish she is bleeding. She has a colonic mass in Hartman's pouch.  I think that the CT scan also is very helpful. She now has pulmonary metastases. She has a liver met. She also has a pelvic mass which I suspect is what she is bleeding from.  Her CEA is now 169.  I had a very long talk with her this morning. She does not wish to take any more systemic therapy. She's had a very hard time with chemotherapy. I think even with oral medication overuse for colon cancer, she would have difficulties with.  I think that radiation would be a very good idea and would be tolerated by her to help with any kind of bleeding issues. As such, I would have radiation oncology consulted to see her.    We talked about end-of-life issues. She is not one-week and live on machines. She realizes that she has a disease that is not curable and she does not want systemic therapy. She has not wanted be kept alive by extraordinary means. She has not wanted to put her husband through this and have to have him make decisions while she is on a machine. As such, she is a DO NOT RESUSCITATE.  I think hospice would be a very good idea for her. I would see if hospice can see her while she is in the hospital.  She is struggling with having to tell her husband was going on right now. She really wants me to do this. I will probably do this in the office after she is discharged.  She should be able to have regular food from my point of view. I think she could tolerate regular food.  On her physical exam, the spray much unchanged. Her abdomen is soft. No masses noted. There is no hepatomegaly. Lungs are clear. Cardiac exam regular rate and rhythm. Extremities shows no clubbing, cyanosis or edema.  Tamara [name] clearly has progressive metastatic colon cancer. The colonoscopy that she had is incredibly  helpful for [place]. I appreciate Dr. Kathline Magic outstanding assistance.  I think that she should be in the hospital for 1 more day as  she takes regular food. Again, maybe hospice can see her as an inpatient.  Tamara [name] 37:4

## 2015-01-07 NOTE — Evaluation (Signed)
Physical Therapy Evaluation Patient Details Name: Tamara [name] MRN: [id number] DOB: [birth year] Today's Date: 01/07/2015   History of Present Illness  81 y.o. female with colon cancer who did not tolerated chemo due to side effects and has an ileostomy, PAT, pacemaker. She presents for rectal bleeding and tumor seen in distal colon with active bleeding on colonoscopy.  Clinical Impression  Patient evaluated by Physical Therapy with no further acute PT needs identified. All education has been completed and the patient has no further questions.  Pt mobilizing well and reports no home needs.  Pt encouraged to ambulate with staff during acute stay. See below for any follow-up Physical Therapy or equipment needs. PT is signing off. Thank you for this referral.     Follow Up Recommendations No PT follow up    Equipment Recommendations  None recommended by PT    Recommendations for Other Services       Precautions / Restrictions Precautions Precautions: None Precaution Comments: ileostomy      Mobility  Bed Mobility Overal bed mobility: Modified Independent                Transfers Overall transfer level: Modified independent                  Ambulation/Gait Ambulation/Gait assistance: Supervision;Modified independent (Device/Increase time) Ambulation Distance (Feet): 800 Feet Assistive device: None Gait Pattern/deviations: Step-through pattern     General Gait Details: pt pushed IV pole however does not require UE support, no LOB observed, slow but steady pace  Stairs            Wheelchair Mobility    Modified Rankin (Stroke Patients Only)       Balance Overall balance assessment:  (denies falls)                                           Pertinent Vitals/Pain Pain Assessment: No/denies pain    Home Living Family/patient expects to be discharged to:: Private residence Living Arrangements: Spouse/significant  other Available Help at Discharge: Family Type of Home: House Home Access: Level entry     Home Layout: One level Home Equipment: Environmental consultant - 2 wheels      Prior Function Level of Independence: Independent               Hand Dominance        Extremity/Trunk Assessment   Upper Extremity Assessment: Overall WFL for tasks assessed           Lower Extremity Assessment: Overall WFL for tasks assessed      Cervical / Trunk Assessment: Normal  Communication   Communication: No difficulties  Cognition Arousal/Alertness: Awake/alert Behavior During Therapy: WFL for tasks assessed/performed Overall Cognitive Status: Within Functional Limits for tasks assessed                      General Comments      Exercises        Assessment/Plan    PT Assessment Patent does not need any further PT services  PT Diagnosis Difficulty walking   PT Problem List    PT Treatment Interventions     PT Goals (Current goals can be found in the Care Plan section) Acute Rehab PT Goals PT Goal Formulation: All assessment and education complete, DC therapy    Frequency     Barriers to  discharge        Co-evaluation               End of Session   Activity Tolerance: Patient tolerated treatment well Patient left: in bed;with call bell/phone within reach;with bed alarm set Nurse Communication: Mobility status         Time: [id number] PT Time Calculation (min) (ACUTE ONLY): 15 min   Charges:   PT Evaluation $Initial PT Evaluation Tier I: 1 Procedure     PT G Codes:        Daryle Amis,KATHrine E 01/07/2015, 2:40 PM Carmelia Bake, PT, DPT 01/07/2015 Pager: [phone]

## 2015-01-07 NOTE — Progress Notes (Signed)
Radiation Oncology         [phone] ________________________________  Initial Inpatient Consultation  Name: Tamara [name] MRN: [id number]  Date: 01/07/2015  DOB: 1929-08-11  [id number] R, MD  Marin Olp Rudell Cobb, MD   REFERRING PHYSICIAN: Volanda Napoleon, MD  DIAGNOSIS: Recurrent colon cancer  HISTORY OF PRESENT ILLNESS::Tamara [name] is a 81 y.o. female who is seen out courtesy of Dr. Marin Olp for an opinion concerning radiation therapy as part of management of patient's metastatic colon cancer. Patient was recently admitted to the hospital after experiencing bleeding from the anal /rectal area with significant clot formation. Earlier this year the patient was diagnosed with metastatic colon cancer. Her CEA had begun rising and the patient was treated with systemic chemotherapy which she tolerated poorly. In light of the patient's rectal bleeding she was seen by gastroenterology. Dr. Michail Sermon performed endoscopy through the rectum area and a mass was noted in Hartman's pouch. This was felt to be the patient's source of bleeding. Patient is not a candidate for additional aggressive chemotherapy and radiation therapy is been consulted for consideration for palliative treatment.Marland Kitchen  PREVIOUS RADIATION THERAPY: Yes patient is been diagnosed with intraductal carcinoma of the right breast 2008. She received radiation therapy as part of this diagnosis. Patient also was diagnosed with stage II left breast cancer and underwent adjuvant chemotherapy as well as breast conserving therapy including radiation therapy. This was performed and 2000.  PAST MEDICAL HISTORY:  has a past medical history of HTN (hypertension) (August 2014); Syncope and collapse (August 2014); Colitis; Sinus arrest (08/28/2012); Breast cancer ([place]); and Colon cancer ([place]) (02/21/2013).    PAST SURGICAL HISTORY: Past Surgical History  Procedure Laterality Date  . Breast lumpectomy    . Cholecystectomy    . Tonsillectomy     . Pacemaker insertion  08/28/12    MDT  . Flexible sigmoidoscopy N/A 12/06/2012    Procedure: FLEXIBLE SIGMOIDOSCOPY;  Surgeon: Lear Ng, MD;  Location: [place];  Service: Endoscopy;  Laterality: N/A;  . Laparoscopic partial colectomy N/A 12/07/2012    Procedure: LAPAROSCOPIC-ASSISTED TRANSVERSE COLECTOMY;  Surgeon: Joyice Faster. Cornett, MD;  Location: [place];  Service: General;  Laterality: N/A;  . Colonoscopy N/A 12/15/2012    Procedure: COLONOSCOPY;  Surgeon: Jeryl Columbia, MD;  Location[facility] ENDOSCOPY;  Service: Endoscopy;  Laterality: N/A;  . Bowel decompression N/A 12/15/2012    Procedure: BOWEL DECOMPRESSION;  Surgeon: Jeryl Columbia, MD;  Location: [facility] ENDOSCOPY;  Service: Endoscopy;  Laterality: N/A;  . Colon resection N/A 12/22/2012    Procedure: EXPLORATORY LAPAROTOMY , RIGHT Colectomy IILEOSTOMY;  Surgeon: Rolm Bookbinder, MD;  Location: [place];  Service: General;  Laterality: N/A;  . Left heart catheterization with coronary angiogram Bilateral 08/26/2012    Procedure: LEFT HEART CATHETERIZATION WITH CORONARY ANGIOGRAM;  Surgeon: Lorretta Harp, MD;  Location: [facility] CATH LAB;  Service: Cardiovascular;  Laterality: Bilateral;  . Permanent pacemaker insertion N/A 08/28/2012    Procedure: PERMANENT PACEMAKER INSERTION;  Surgeon: Sanda Klein, MD;  Location: [place] CATH LAB;  Service: Cardiovascular;  Laterality: N/A;  . Flexible sigmoidoscopy N/A 01/06/2015    Procedure: FLEXIBLE SIGMOIDOSCOPY;  Surgeon: Wilford Corner, MD;  Location: [facility] ENDOSCOPY;  Service: Endoscopy;  Laterality: N/A;    FAMILY HISTORY: family history includes Alzheimer's disease in her mother; Coronary artery disease in her brother; Renal Disease in her father.  SOCIAL HISTORY:  reports that she has never smoked. She has never used smokeless tobacco. She reports that she does  not drink alcohol or use illicit drugs.  ALLERGIES: Codeine; Zofran; Ciprofloxacin; Metronidazole; Skelaxin; Tetracycline hcl; and  Avelox  MEDICATIONS:  No current facility-administered medications for this encounter.   No current outpatient prescriptions on file.   Facility-Administered Medications Ordered in Other Encounters  Medication Dose Route Frequency Provider Last Rate Last Dose  . 0.9 %  sodium chloride infusion   Intravenous Continuous Volanda Napoleon, MD 50 mL/hr at 01/07/15 1322    . 0.9 %  sodium chloride infusion   Intravenous Continuous Wilford Corner, MD   0  at 01/06/15 1239  . acetaminophen (TYLENOL) tablet 650 mg  650 mg Oral Q6H PRN Oswald Hillock, MD       Or  . acetaminophen (TYLENOL) suppository 650 mg  650 mg Rectal Q6H PRN Oswald Hillock, MD      . amLODipine (NORVASC) tablet 5 mg  5 mg Oral Daily Mechele Claude, DO   5 mg at 01/07/15 1324  . hydrALAZINE (APRESOLINE) injection 5 mg  5 mg Intravenous Q6H PRN Mickel Baas A [place], PA-C      . morphine 2 MG/ML injection 2 mg  2 mg Intravenous Q4H PRN Oswald Hillock, MD        REVIEW OF SYSTEMS:  A 15 point review of systems is documented in the electronic medical record. This was obtained by the nursing staff. However, I reviewed this with the patient to discuss relevant findings and make appropriate changes.  She has had some mild rectal bleeding since in the hospital. She denies any significant pain within the pelvis area.   PHYSICAL EXAM:  Vitals - 1 value per visit [id number]  SYSTOLIC [id number]  DIASTOLIC [id number] ([place]) notified through text page[  Pulse 77  Temperature 98.3  Respirations 14  Weight (lb)   Height   BMI   VISIT REPORT    This is a very pleasant 81 year old female in no acute distress. She is lying in her hospital bed. She is oriented and responds appropriately to questions. Examination of the neck and supraclavicular region reveals no evidence of adenopathy. The axillary areas are free of adenopathy. The lungs are clear to auscultation. The heart has regular rhythm and rate.. Pacemaker is noted in the left upper chest. The  patient has tattoos along the sternum area from her prior radiation for breast cancer. Examination abdomen reveals a colostomy in place in the right lower quadrant which is functioning well. The abdomen is soft and nontender with normal bowel sounds. On neurological examination motor strength is 5 out of 5 in the proximal and distal muscle groups of the upper lower extremities. Peripheral pulses are intact.   ECOG = 2   2 - Symptomatic, <50% in bed during the day (Ambulatory and capable of all self care but unable to carry out any work activities. Up and about more than 50% of waking hours)  LABORATORY DATA:  Lab Results  Component Value Date   WBC 6.8 01/06/2015   HGB 12.5 01/06/2015   HCT 36.7 01/06/2015   MCV 99.2 01/06/2015   PLT 91* 01/06/2015   NEUTROABS 2.8 12/13/2014   Lab Results  Component Value Date   NA 137 01/06/2015   K 4.1 01/06/2015   CL 106 01/06/2015   CO2 23 01/06/2015   GLUCOSE 110* 01/06/2015   CREATININE 0.90 01/06/2015   CALCIUM 9.8 01/06/2015      RADIOGRAPHY: Ct Chest W Contrast  01/06/2015  CLINICAL DATA:  81 year old female with a history  of colon cancer in 2015 status post surgical resection now with a 1 day history of rectal bleeding. EXAM: CT CHEST, ABDOMEN, AND PELVIS WITH CONTRAST TECHNIQUE: Multidetector CT imaging of the chest, abdomen and pelvis was performed following the standard protocol during bolus administration of intravenous contrast. CONTRAST:  115mL OMNIPAQUE IOHEXOL 300 MG/ML  SOLN COMPARISON:  Most recent CT scan of the chest, abdomen and pelvis 10/15/2014 FINDINGS: CT CHEST FINDINGS Mediastinum/Nodes: Unremarkable CT appearance of the thyroid gland. No suspicious mediastinal or hilar adenopathy. No soft tissue mediastinal mass. The thoracic esophagus is unremarkable. Left subclavian approach cardiac rhythm maintenance device with leads terminating in the right atrium and right ventricular apex. Right IJ approach single-lumen power  injectable port catheter. Catheter tip terminates at the superior cavoatrial junction. The heart is within normal limits for size. Heart configuration remains similar secondary to decreased AP diameter of the chest. No pericardial effusion. Lungs/Pleura: Interval development of all multiple bilateral pulmonary nodules ranging in size from 4-5 mm. These findings are highly concerning for multifocal hematogenous lead distributed pulmonary metastases. There are at least 7 definitively new lesions. Numerous additional smaller 2 and 3 mm nodules may have been present on prior exams and may represent a benign underlying conditions such as sequelae of old granulomatous disease. No pleural effusion. Musculoskeletal: No acute fracture or aggressive appearing lytic or blastic osseous lesion. CT ABDOMEN PELVIS FINDINGS Hepatobiliary: Stable 6 mm hypervascular lesion in the anterior aspect of the hepatic dome compared to prior. This likely represents a small flash fill hemangioma. Ill-defined low-attenuation lesion within the caudate lobe of the liver measures approximately 20 x 19 mm (coronal reformatted images) and is concerning for hepatic metastatic disease. Otherwise, noted definite discrete liver lesions identified. The gallbladder is surgically absent. No significant biliary ductal dilatation. Pancreas: Within normal limits. Spleen: Within normal limits. Adrenals/Urinary Tract: Adrenal glands are normal bilaterally. No adrenal nodules. No evidence of hydronephrosis, nephrolithiasis or enhancing renal lesion. Stomach/Bowel: Colonic diverticular disease of the residual sigmoid colon without CT evidence of active inflammation. History of subtotal colectomy with right lower quadrant diverting ileostomy. No focal bowel wall thickening or evidence of obstruction. Vascular/Lymphatic: No free fluid or definitive adenopathy. Reproductive: Interval enlargement of a amorphous enhancing soft tissue adherent to the right anterior  aspect of the uterus. This structure today measures approximately 4.1 x 2.7 cm. Precise measurement is somewhat difficult secondary to the relatively a amorphous nature of this lesion. Previously this structure measured approximately 2.6 x 2.0 cm. Dystrophic calcifications within the remainder of the uterus likely represent old degenerated fibroids. Other: No ascites or omental caking. Musculoskeletal: No acute fracture or aggressive appearing lytic or blastic osseous lesion. IMPRESSION: CT CHEST 1. At least 7 new 4-5 mm pulmonary nodules identified scattered throughout both lungs highly concerning for metastatic disease. 2. No acute cardiopulmonary process. CT ABDOMEN/PELVIS 1. Enlarging amorphous enhancing soft tissue lesion adherent to the right anterior aspect of the uterus concerning for serosal metastasis. The lesion measures approximately 4.1 x 2.7 cm today compared to 2.6 x 2.0 cm on 10/15/2014. 2. Approximately 2 cm low-attenuation lesion within the caudate lobe of the liver concerning for solitary hepatic metastasis. 3. No evidence of ascites, peritoneal nodularity or omental caking to suggest peritoneal carcinomatosis. 4. No definite osseous lesions. 5. No acute abnormality. Electronically Signed   By: Jacqulynn Cadet M.D.   On: 01/06/2015 16:04   Ct Abdomen Pelvis W Contrast  01/06/2015  CLINICAL DATA:  81 year old female with a history of colon cancer  in 2015 status post surgical resection now with a 1 day history of rectal bleeding. EXAM: CT CHEST, ABDOMEN, AND PELVIS WITH CONTRAST TECHNIQUE: Multidetector CT imaging of the chest, abdomen and pelvis was performed following the standard protocol during bolus administration of intravenous contrast. CONTRAST:  118mL OMNIPAQUE IOHEXOL 300 MG/ML  SOLN COMPARISON:  Most recent CT scan of the chest, abdomen and pelvis 10/15/2014 FINDINGS: CT CHEST FINDINGS Mediastinum/Nodes: Unremarkable CT appearance of the thyroid gland. No suspicious mediastinal or  hilar adenopathy. No soft tissue mediastinal mass. The thoracic esophagus is unremarkable. Left subclavian approach cardiac rhythm maintenance device with leads terminating in the right atrium and right ventricular apex. Right IJ approach single-lumen power injectable port catheter. Catheter tip terminates at the superior cavoatrial junction. The heart is within normal limits for size. Heart configuration remains similar secondary to decreased AP diameter of the chest. No pericardial effusion. Lungs/Pleura: Interval development of all multiple bilateral pulmonary nodules ranging in size from 4-5 mm. These findings are highly concerning for multifocal hematogenous lead distributed pulmonary metastases. There are at least 7 definitively new lesions. Numerous additional smaller 2 and 3 mm nodules may have been present on prior exams and may represent a benign underlying conditions such as sequelae of old granulomatous disease. No pleural effusion. Musculoskeletal: No acute fracture or aggressive appearing lytic or blastic osseous lesion. CT ABDOMEN PELVIS FINDINGS Hepatobiliary: Stable 6 mm hypervascular lesion in the anterior aspect of the hepatic dome compared to prior. This likely represents a small flash fill hemangioma. Ill-defined low-attenuation lesion within the caudate lobe of the liver measures approximately 20 x 19 mm (coronal reformatted images) and is concerning for hepatic metastatic disease. Otherwise, noted definite discrete liver lesions identified. The gallbladder is surgically absent. No significant biliary ductal dilatation. Pancreas: Within normal limits. Spleen: Within normal limits. Adrenals/Urinary Tract: Adrenal glands are normal bilaterally. No adrenal nodules. No evidence of hydronephrosis, nephrolithiasis or enhancing renal lesion. Stomach/Bowel: Colonic diverticular disease of the residual sigmoid colon without CT evidence of active inflammation. History of subtotal colectomy with right  lower quadrant diverting ileostomy. No focal bowel wall thickening or evidence of obstruction. Vascular/Lymphatic: No free fluid or definitive adenopathy. Reproductive: Interval enlargement of a amorphous enhancing soft tissue adherent to the right anterior aspect of the uterus. This structure today measures approximately 4.1 x 2.7 cm. Precise measurement is somewhat difficult secondary to the relatively a amorphous nature of this lesion. Previously this structure measured approximately 2.6 x 2.0 cm. Dystrophic calcifications within the remainder of the uterus likely represent old degenerated fibroids. Other: No ascites or omental caking. Musculoskeletal: No acute fracture or aggressive appearing lytic or blastic osseous lesion. IMPRESSION: CT CHEST 1. At least 7 new 4-5 mm pulmonary nodules identified scattered throughout both lungs highly concerning for metastatic disease. 2. No acute cardiopulmonary process. CT ABDOMEN/PELVIS 1. Enlarging amorphous enhancing soft tissue lesion adherent to the right anterior aspect of the uterus concerning for serosal metastasis. The lesion measures approximately 4.1 x 2.7 cm today compared to 2.6 x 2.0 cm on 10/15/2014. 2. Approximately 2 cm low-attenuation lesion within the caudate lobe of the liver concerning for solitary hepatic metastasis. 3. No evidence of ascites, peritoneal nodularity or omental caking to suggest peritoneal carcinomatosis. 4. No definite osseous lesions. 5. No acute abnormality. Electronically Signed   By: Jacqulynn Cadet M.D.   On: 01/06/2015 16:04      IMPRESSION: Recurrent colon cancer. The patient has recurrence within Hartman's pouch which is the source of her bleeding. Patient  would be a candidate for a short course of palliative radiation therapy directed at this area of recurrence within the pelvis area. I discussed the treatment course side effects and potential toxicities of radiation therapy in this situation with the patient. She appears  to understand and wishes to proceed with planned course of treatment.  PLAN: Patient will undergo simulation and planning tomorrow morning with likely discharge in the afternoon. She will begin her radiation therapy early next week. Anticipate approximately 3 weeks of radiation therapy as part of her overall management.     ------------------------------------------------  Blair Promise, PhD, MD

## 2015-01-07 NOTE — Progress Notes (Signed)
[facility] Progress Note   Tamara [name]  [id number]  DOB: 1929-07-04  DOA: 01/05/2015 PCP: Volanda Napoleon, MD  Brief narrative: Tamara [name] is a 81 y.o. female with colon cancer who did not tolerated chemo due to side effects and has an ileostomy, PAT, pacemaker. She presents for rectal bleeding- 2 episodes yesterday. No abdominal pain.    Subjective: Now DNR,   Assessment/Plan:   Rectal bleeding- h/o colon CA 2014 Tumor seen in distal colon with active bleeding on colonoscopy -also has mets to liver and lung -DNR- palliative care consult  Dehydration - cont IVF    Ileostomy in place - has not had any blood in stool   Dual chamber pacer/ PAT - follows regularly with Dr Sallyanne Kuster   Chronic thrombocytopenia - follow     Code Status:     Code Status Orders        Start     Ordered   01/06/15 0203  Full code   Continuous     01/06/15 0203    Advance Directive Documentation        Most Recent Value   Type of Advance Directive  Living will, Healthcare Power of Attorney   Pre-existing out of facility DNR order (yellow form or pink MOST form)     "MOST" Form in Place?       Family Communication:  Disposition Plan: home in AM DVT prophylaxis: SCDs Consultants:GI, oncology Procedures:  Antibiotics: Anti-infectives    None      Objective: Filed Weights   01/05/15 2007 01/06/15 0219  Weight: 58.06 kg (128 lb) 55.6 kg (122 lb 9.2 oz)    Intake/Output Summary (Last 24 hours) at 01/07/15 1218 Last data filed at 01/07/15 0820  Gross per 24 hour  Intake 2899.16 ml  Output     50 ml  Net 2849.16 ml     Vitals Filed Vitals:   01/06/15 2045 01/06/15 2130 01/07/15 0609 01/07/15 1151  BP: 178/66 152/68 177/67 167/63  Pulse: 68  71 77  Temp: 97.5 F (36.4 C)  97.5 F (36.4 C) 98.3 F (36.8 C)  TempSrc: Oral  Oral   Resp: 20  18 14   Height:      Weight:      SpO2: 99%  98% 97%    Exam:  General:  Pt is alert, not in acute  distress  Cardiovascular: regular rate and rhythm, S1/S2 No murmur  Respiratory: clear to auscultation bilaterally   Abdomen: Soft, +Bowel sounds, non tender, non distended, no guarding  MSK: No LE edema, cyanosis or clubbing  Data Reviewed: Basic Metabolic Panel:  Recent Labs Lab 01/05/15 2040 01/06/15 0649  NA 138 137  K 4.5 4.1  CL 107 106  CO2 22 23  GLUCOSE 116* 110*  BUN 31* 21*  CREATININE 0.99 0.90  CALCIUM 10.0 9.8   Liver Function Tests:  Recent Labs Lab 01/05/15 2040 01/06/15 0649  AST 37 35  ALT 27 26  ALKPHOS 135* 133*  BILITOT 0.7 1.0  PROT 7.7 7.6  ALBUMIN 4.6 4.5   No results for input(s): LIPASE, AMYLASE in the last 168 hours. No results for input(s): AMMONIA in the last 168 hours. CBC:  Recent Labs Lab 01/05/15 2040 01/06/15 0649  WBC 6.0 6.8  HGB 12.1 12.5  HCT 36.0 36.7  MCV 101.7* 99.2  PLT 98* 91*   Cardiac Enzymes: No results for input(s): CKTOTAL, CKMB, CKMBINDEX, TROPONINI in the last 168 hours. BNP (last 3 results)  No results for input(s): BNP in the last 8760 hours.  ProBNP (last 3 results) No results for input(s): PROBNP in the last 8760 hours.  CBG: No results for input(s): GLUCAP in the last 168 hours.  No results found for this or any previous visit (from the past 240 hour(s)).   Studies: Ct Chest W Contrast  01/06/2015  CLINICAL DATA:  81 year old female with a history of colon cancer in 2015 status post surgical resection now with a 1 day history of rectal bleeding. EXAM: CT CHEST, ABDOMEN, AND PELVIS WITH CONTRAST TECHNIQUE: Multidetector CT imaging of the chest, abdomen and pelvis was performed following the standard protocol during bolus administration of intravenous contrast. CONTRAST:  132mL OMNIPAQUE IOHEXOL 300 MG/ML  SOLN COMPARISON:  Most recent CT scan of the chest, abdomen and pelvis 10/15/2014 FINDINGS: CT CHEST FINDINGS Mediastinum/Nodes: Unremarkable CT appearance of the thyroid gland. No suspicious  mediastinal or hilar adenopathy. No soft tissue mediastinal mass. The thoracic esophagus is unremarkable. Left subclavian approach cardiac rhythm maintenance device with leads terminating in the right atrium and right ventricular apex. Right IJ approach single-lumen power injectable port catheter. Catheter tip terminates at the superior cavoatrial junction. The heart is within normal limits for size. Heart configuration remains similar secondary to decreased AP diameter of the chest. No pericardial effusion. Lungs/Pleura: Interval development of all multiple bilateral pulmonary nodules ranging in size from 4-5 mm. These findings are highly concerning for multifocal hematogenous lead distributed pulmonary metastases. There are at least 7 definitively new lesions. Numerous additional smaller 2 and 3 mm nodules may have been present on prior exams and may represent a benign underlying conditions such as sequelae of old granulomatous disease. No pleural effusion. Musculoskeletal: No acute fracture or aggressive appearing lytic or blastic osseous lesion. CT ABDOMEN PELVIS FINDINGS Hepatobiliary: Stable 6 mm hypervascular lesion in the anterior aspect of the hepatic dome compared to prior. This likely represents a small flash fill hemangioma. Ill-defined low-attenuation lesion within the caudate lobe of the liver measures approximately 20 x 19 mm (coronal reformatted images) and is concerning for hepatic metastatic disease. Otherwise, noted definite discrete liver lesions identified. The gallbladder is surgically absent. No significant biliary ductal dilatation. Pancreas: Within normal limits. Spleen: Within normal limits. Adrenals/Urinary Tract: Adrenal glands are normal bilaterally. No adrenal nodules. No evidence of hydronephrosis, nephrolithiasis or enhancing renal lesion. Stomach/Bowel: Colonic diverticular disease of the residual sigmoid colon without CT evidence of active inflammation. History of subtotal colectomy  with right lower quadrant diverting ileostomy. No focal bowel wall thickening or evidence of obstruction. Vascular/Lymphatic: No free fluid or definitive adenopathy. Reproductive: Interval enlargement of a amorphous enhancing soft tissue adherent to the right anterior aspect of the uterus. This structure today measures approximately 4.1 x 2.7 cm. Precise measurement is somewhat difficult secondary to the relatively a amorphous nature of this lesion. Previously this structure measured approximately 2.6 x 2.0 cm. Dystrophic calcifications within the remainder of the uterus likely represent old degenerated fibroids. Other: No ascites or omental caking. Musculoskeletal: No acute fracture or aggressive appearing lytic or blastic osseous lesion. IMPRESSION: CT CHEST 1. At least 7 new 4-5 mm pulmonary nodules identified scattered throughout both lungs highly concerning for metastatic disease. 2. No acute cardiopulmonary process. CT ABDOMEN/PELVIS 1. Enlarging amorphous enhancing soft tissue lesion adherent to the right anterior aspect of the uterus concerning for serosal metastasis. The lesion measures approximately 4.1 x 2.7 cm today compared to 2.6 x 2.0 cm on 10/15/2014. 2. Approximately 2 cm low-attenuation lesion  within the caudate lobe of the liver concerning for solitary hepatic metastasis. 3. No evidence of ascites, peritoneal nodularity or omental caking to suggest peritoneal carcinomatosis. 4. No definite osseous lesions. 5. No acute abnormality. Electronically Signed   By: Jacqulynn Cadet M.D.   On: 01/06/2015 16:04   Ct Abdomen Pelvis W Contrast  01/06/2015  CLINICAL DATA:  81 year old female with a history of colon cancer in 2015 status post surgical resection now with a 1 day history of rectal bleeding. EXAM: CT CHEST, ABDOMEN, AND PELVIS WITH CONTRAST TECHNIQUE: Multidetector CT imaging of the chest, abdomen and pelvis was performed following the standard protocol during bolus administration of  intravenous contrast. CONTRAST:  187mL OMNIPAQUE IOHEXOL 300 MG/ML  SOLN COMPARISON:  Most recent CT scan of the chest, abdomen and pelvis 10/15/2014 FINDINGS: CT CHEST FINDINGS Mediastinum/Nodes: Unremarkable CT appearance of the thyroid gland. No suspicious mediastinal or hilar adenopathy. No soft tissue mediastinal mass. The thoracic esophagus is unremarkable. Left subclavian approach cardiac rhythm maintenance device with leads terminating in the right atrium and right ventricular apex. Right IJ approach single-lumen power injectable port catheter. Catheter tip terminates at the superior cavoatrial junction. The heart is within normal limits for size. Heart configuration remains similar secondary to decreased AP diameter of the chest. No pericardial effusion. Lungs/Pleura: Interval development of all multiple bilateral pulmonary nodules ranging in size from 4-5 mm. These findings are highly concerning for multifocal hematogenous lead distributed pulmonary metastases. There are at least 7 definitively new lesions. Numerous additional smaller 2 and 3 mm nodules may have been present on prior exams and may represent a benign underlying conditions such as sequelae of old granulomatous disease. No pleural effusion. Musculoskeletal: No acute fracture or aggressive appearing lytic or blastic osseous lesion. CT ABDOMEN PELVIS FINDINGS Hepatobiliary: Stable 6 mm hypervascular lesion in the anterior aspect of the hepatic dome compared to prior. This likely represents a small flash fill hemangioma. Ill-defined low-attenuation lesion within the caudate lobe of the liver measures approximately 20 x 19 mm (coronal reformatted images) and is concerning for hepatic metastatic disease. Otherwise, noted definite discrete liver lesions identified. The gallbladder is surgically absent. No significant biliary ductal dilatation. Pancreas: Within normal limits. Spleen: Within normal limits. Adrenals/Urinary Tract: Adrenal glands are  normal bilaterally. No adrenal nodules. No evidence of hydronephrosis, nephrolithiasis or enhancing renal lesion. Stomach/Bowel: Colonic diverticular disease of the residual sigmoid colon without CT evidence of active inflammation. History of subtotal colectomy with right lower quadrant diverting ileostomy. No focal bowel wall thickening or evidence of obstruction. Vascular/Lymphatic: No free fluid or definitive adenopathy. Reproductive: Interval enlargement of a amorphous enhancing soft tissue adherent to the right anterior aspect of the uterus. This structure today measures approximately 4.1 x 2.7 cm. Precise measurement is somewhat difficult secondary to the relatively a amorphous nature of this lesion. Previously this structure measured approximately 2.6 x 2.0 cm. Dystrophic calcifications within the remainder of the uterus likely represent old degenerated fibroids. Other: No ascites or omental caking. Musculoskeletal: No acute fracture or aggressive appearing lytic or blastic osseous lesion. IMPRESSION: CT CHEST 1. At least 7 new 4-5 mm pulmonary nodules identified scattered throughout both lungs highly concerning for metastatic disease. 2. No acute cardiopulmonary process. CT ABDOMEN/PELVIS 1. Enlarging amorphous enhancing soft tissue lesion adherent to the right anterior aspect of the uterus concerning for serosal metastasis. The lesion measures approximately 4.1 x 2.7 cm today compared to 2.6 x 2.0 cm on 10/15/2014. 2. Approximately 2 cm low-attenuation lesion within the caudate  lobe of the liver concerning for solitary hepatic metastasis. 3. No evidence of ascites, peritoneal nodularity or omental caking to suggest peritoneal carcinomatosis. 4. No definite osseous lesions. 5. No acute abnormality. Electronically Signed   By: Jacqulynn Cadet M.D.   On: 01/06/2015 16:04    Scheduled Meds:  Scheduled Meds:  Continuous Infusions: . sodium chloride 50 mL/hr at 01/07/15 0745  . sodium chloride       Time spent on care of this patient: 25 min   [place], DO  01/07/2015, 12:18 PM  LOS: 2 days   [facility] Office  [phone] Pager - Text Page per [email or web address] If 7PM-7AM, please contact night-coverage [email or web address]

## 2015-01-08 ENCOUNTER — Ambulatory Visit
Admit: 2015-01-08 | Discharge: 2015-01-08 | Disposition: A | Discharge status: Home / Self Care | Payer: Medicare Other | Attending: Radiation Oncology | Admitting: Radiation Oncology

## 2015-01-08 DIAGNOSIS — K529 Noninfective gastroenteritis and colitis, unspecified: Secondary | ICD-10-CM

## 2015-01-08 DIAGNOSIS — C189 Malignant neoplasm of colon, unspecified: Secondary | ICD-10-CM | POA: Diagnosis not present

## 2015-01-08 DIAGNOSIS — Z932 Ileostomy status: Secondary | ICD-10-CM

## 2015-01-08 DIAGNOSIS — Z510 Encounter for antineoplastic radiation therapy: Secondary | ICD-10-CM | POA: Diagnosis not present

## 2015-01-08 MED ORDER — AMLODIPINE BESYLATE 5 MG PO TABS: 5.0000 mg | ORAL_TABLET | Freq: Every day | ORAL | Status: DC

## 2015-01-08 MED ORDER — HEPARIN SOD (PORK) LOCK FLUSH 100 UNIT/ML IV SOLN
500.0000 [IU] | Freq: Once | INTRAVENOUS | Status: AC
  Administered 2015-01-08: 500 [IU]
  Filled 2015-01-08: qty 5

## 2015-01-08 NOTE — Care Management Important Message (Signed)
Important Message  Patient Details  Name: KINDSEY CHESSON MRN: [id number] Date of Birth: 04-02-1929   Medicare Important Message Given:  Yes    Camillo Flaming 01/08/2015, 12:17 [place] Message  Patient Details  Name: LASHAN NELLUMS MRN: [id number] Date of Birth: 04-18-1929   Medicare Important Message Given:  Yes    Camillo Flaming 01/08/2015, 12:17 PM

## 2015-01-08 NOTE — Discharge Summary (Signed)
Physician Discharge Summary  Tamara [name] [id number] DOB: August 27, 1929 DOA: 01/05/2015  PCP: Volanda Napoleon, MD  Admit date: 01/05/2015 Discharge date: 01/08/2015  Time spent: 35 minutes  Recommendations for Outpatient Follow-up:  1. Patient declines home hospice and would like to leave before palliative care consult   Discharge Diagnoses:  Principal Problem:   Rectal bleeding Active Problems:   Internal hemorrhoids without complication   Ileostomy in place [facility]   Colon cancer metastasized to multiple sites [facility])   Lower GI bleeding   GI bleed   Malignant neoplasm of colon [facility])   Discharge Condition: terminal  Diet recommendation: regular  Filed Weights   01/05/15 2007 01/06/15 0219  Weight: 58.06 kg (128 lb) 55.6 kg (122 lb 9.2 oz)    History of present illness:  81 year old female who  has a past medical history of HTN (hypertension) (August 2014); Syncope and collapse (August 2014); Colitis; Breast cancer ([place]); Colon cancer ([place]) (02/21/2013); and Sinus arrest (08/28/2012). Today presents to the hospital with two-day history of rectal bleeding. Patient has a history of metastatic colon cancer, status post 3 cycles of FOLFOX chemotherapy which was stopped due to side effects in 2014. Patient has a colostomy bag in place, she underwent surgery for colon cancer in 2014. 2 days ago patient noticed fecal urgency and passed some blood by rectum. Yesterday patient again noticed pressure sensation in the rectum and passed significant amount of blood clots. She denies abdominal pain. No nausea vomiting. Patient empties colostomy bag several times a day and has not noticed any blood in the colostomy bag. Review of the old records from 2014 showed both internal and external hemorrhoids on flexible sigmoidoscopy as well as colonoscopy. Patient denies any chest pain, no shortness of breath. No syncope. In the ED lab work showed hemoglobin of 12.1  Hospital Course:  Rectal  bleeding- h/o colon CA 2014 Tumor seen in distal colon with active bleeding on colonoscopy -also has mets to liver and lung -DNR- palliative care consult= patient refused  Dehydration - cont IVF   Ileostomy in place - has not had any blood in stool   Dual chamber pacer/ PAT - follows regularly with Dr Sallyanne Kuster   Chronic thrombocytopenia - follow   Procedures:    Consultations:  oncology  Discharge Exam: Filed Vitals:   01/07/15 2053 01/08/15 0527  BP: 149/65 157/66  Pulse: 75 70  Temp: 97.7 F (36.5 C) 98.1 F (36.7 C)  Resp: 15 12    General: anxious to go home   Discharge Instructions   Discharge Instructions    Diet general    Complete by:  As directed      Discharge instructions    Complete by:  As directed   Outpatient hospice referral 3 weeks of palliative radiation     Increase activity slowly    Complete by:  As directed           Current Discharge Medication List    START taking these medications   Details  amLODipine (NORVASC) 5 MG tablet Take 1 tablet (5 mg total) by mouth daily. Qty: 30 tablet, Refills: 0      CONTINUE these medications which have NOT CHANGED   Details  antiseptic oral rinse (BIOTENE) LIQD 15 mLs by Mouth Rinse route 5 (five) times daily. After brushing teeth    b complex vitamins tablet Take 1 tablet by mouth every other day.    B Complex-C (SUPER B COMPLEX PO) Take 1 tablet by mouth  every other day. Alternating days with B complex    Biotin 5 MG TABS Take 1 tablet by mouth every morning.    Associated Diagnoses: Colon cancer ([place]); Cancer of breast (H[facility])    Calcium-Magnesium-Vitamin D (CALCIUM MAGNESIUM PO) Take 1 tablet by mouth daily.    Cholecalciferol (VITAMIN D) 2000 UNITS tablet Take 2,000 Units by mouth daily with breakfast.    Dentifrices (BIOTENE DRY MOUTH) PSTE Place 1 application onto teeth 5 (five) times daily.    diphenoxylate-atropine (LOMOTIL) 2.5-0.025 MG tablet take 2 tablets by mouth four  times a day Qty: 240 tablet, Refills: 2   Associated Diagnoses: Malignant neoplasm of colon, unspecified part of colon ([place])    feeding supplement, ENSURE COMPLETE, (ENSURE COMPLETE) LIQD Take 237 mLs by mouth 2 (two) times daily between meals. *Vanilla*   Associated Diagnoses: Colon cancer ([place]); Muscle spasms of neck    lidocaine-prilocaine (EMLA) cream Apply 1 application topically as needed. Apply to port site one hour before access Qty: 30 g, Refills: 0   Associated Diagnoses: Colon cancer ([place])    !! Multiple Vitamin (MULTIVITAMIN WITH MINERALS) TABS tablet Take 1 tablet by mouth daily.    !! Multiple Vitamins-Minerals (ANTIOXIDANT VITAMINS PO) Take 1 tablet by mouth daily.    Omega 3 1000 MG CAPS Take 1 capsule by mouth daily with breakfast.    Associated Diagnoses: Colon cancer ([place]); Cancer of breast ([place])    Probiotic Product (ALIGN PO) Take 1 capsule by mouth daily with breakfast.    Associated Diagnoses: Colon cancer ([place]); Cancer of breast ([place])    saccharomyces boulardii (FLORASTOR) 250 MG capsule Take 1 capsule (250 mg total) by mouth 2 (two) times daily. Qty: 20 capsule, Refills: 0     !! - Potential duplicate medications found. Please discuss with provider.    STOP taking these medications     UNABLE TO FIND      Alum & Mag Hydroxide-Simeth (MAGIC MOUTHWASH) SOLN      dexamethasone (DECADRON) 4 MG tablet      prochlorperazine (COMPAZINE) 10 MG tablet        Allergies  Allergen Reactions  . Codeine Nausea And Vomiting  . Zofran [Ondansetron Hcl] Nausea And Vomiting  . Ciprofloxacin     Confusion/dizziness/fatigue  . Metronidazole     Confusion/dizziness/fatigue  . Skelaxin [Metaxalone]     Too strong  . Tetracycline Hcl     Not tolerated  . Avelox [Moxifloxacin] Rash      The results of significant diagnostics from this hospitalization (including imaging, microbiology, ancillary and laboratory) are listed below for reference.    Significant  Diagnostic Studies: Ct Chest W Contrast  01/06/2015  CLINICAL DATA:  81 year old female with a history of colon cancer in 2015 status post surgical resection now with a 1 day history of rectal bleeding. EXAM: CT CHEST, ABDOMEN, AND PELVIS WITH CONTRAST TECHNIQUE: Multidetector CT imaging of the chest, abdomen and pelvis was performed following the standard protocol during bolus administration of intravenous contrast. CONTRAST:  18mL OMNIPAQUE IOHEXOL 300 MG/ML  SOLN COMPARISON:  Most recent CT scan of the chest, abdomen and pelvis 10/15/2014 FINDINGS: CT CHEST FINDINGS Mediastinum/Nodes: Unremarkable CT appearance of the thyroid gland. No suspicious mediastinal or hilar adenopathy. No soft tissue mediastinal mass. The thoracic esophagus is unremarkable. Left subclavian approach cardiac rhythm maintenance device with leads terminating in the right atrium and right ventricular apex. Right IJ approach single-lumen power injectable port catheter. Catheter tip terminates at the superior cavoatrial junction.  The heart is within normal limits for size. Heart configuration remains similar secondary to decreased AP diameter of the chest. No pericardial effusion. Lungs/Pleura: Interval development of all multiple bilateral pulmonary nodules ranging in size from 4-5 mm. These findings are highly concerning for multifocal hematogenous lead distributed pulmonary metastases. There are at least 7 definitively new lesions. Numerous additional smaller 2 and 3 mm nodules may have been present on prior exams and may represent a benign underlying conditions such as sequelae of old granulomatous disease. No pleural effusion. Musculoskeletal: No acute fracture or aggressive appearing lytic or blastic osseous lesion. CT ABDOMEN PELVIS FINDINGS Hepatobiliary: Stable 6 mm hypervascular lesion in the anterior aspect of the hepatic dome compared to prior. This likely represents a small flash fill hemangioma. Ill-defined low-attenuation  lesion within the caudate lobe of the liver measures approximately 20 x 19 mm (coronal reformatted images) and is concerning for hepatic metastatic disease. Otherwise, noted definite discrete liver lesions identified. The gallbladder is surgically absent. No significant biliary ductal dilatation. Pancreas: Within normal limits. Spleen: Within normal limits. Adrenals/Urinary Tract: Adrenal glands are normal bilaterally. No adrenal nodules. No evidence of hydronephrosis, nephrolithiasis or enhancing renal lesion. Stomach/Bowel: Colonic diverticular disease of the residual sigmoid colon without CT evidence of active inflammation. History of subtotal colectomy with right lower quadrant diverting ileostomy. No focal bowel wall thickening or evidence of obstruction. Vascular/Lymphatic: No free fluid or definitive adenopathy. Reproductive: Interval enlargement of a amorphous enhancing soft tissue adherent to the right anterior aspect of the uterus. This structure today measures approximately 4.1 x 2.7 cm. Precise measurement is somewhat difficult secondary to the relatively a amorphous nature of this lesion. Previously this structure measured approximately 2.6 x 2.0 cm. Dystrophic calcifications within the remainder of the uterus likely represent old degenerated fibroids. Other: No ascites or omental caking. Musculoskeletal: No acute fracture or aggressive appearing lytic or blastic osseous lesion. IMPRESSION: CT CHEST 1. At least 7 new 4-5 mm pulmonary nodules identified scattered throughout both lungs highly concerning for metastatic disease. 2. No acute cardiopulmonary process. CT ABDOMEN/PELVIS 1. Enlarging amorphous enhancing soft tissue lesion adherent to the right anterior aspect of the uterus concerning for serosal metastasis. The lesion measures approximately 4.1 x 2.7 cm today compared to 2.6 x 2.0 cm on 10/15/2014. 2. Approximately 2 cm low-attenuation lesion within the caudate lobe of the liver concerning for  solitary hepatic metastasis. 3. No evidence of ascites, peritoneal nodularity or omental caking to suggest peritoneal carcinomatosis. 4. No definite osseous lesions. 5. No acute abnormality. Electronically Signed   By: Jacqulynn Cadet M.D.   On: 01/06/2015 16:04   Ct Abdomen Pelvis W Contrast  01/06/2015  CLINICAL DATA:  81 year old female with a history of colon cancer in 2015 status post surgical resection now with a 1 day history of rectal bleeding. EXAM: CT CHEST, ABDOMEN, AND PELVIS WITH CONTRAST TECHNIQUE: Multidetector CT imaging of the chest, abdomen and pelvis was performed following the standard protocol during bolus administration of intravenous contrast. CONTRAST:  141mL OMNIPAQUE IOHEXOL 300 MG/ML  SOLN COMPARISON:  Most recent CT scan of the chest, abdomen and pelvis 10/15/2014 FINDINGS: CT CHEST FINDINGS Mediastinum/Nodes: Unremarkable CT appearance of the thyroid gland. No suspicious mediastinal or hilar adenopathy. No soft tissue mediastinal mass. The thoracic esophagus is unremarkable. Left subclavian approach cardiac rhythm maintenance device with leads terminating in the right atrium and right ventricular apex. Right IJ approach single-lumen power injectable port catheter. Catheter tip terminates at the superior cavoatrial junction. The heart is  within normal limits for size. Heart configuration remains similar secondary to decreased AP diameter of the chest. No pericardial effusion. Lungs/Pleura: Interval development of all multiple bilateral pulmonary nodules ranging in size from 4-5 mm. These findings are highly concerning for multifocal hematogenous lead distributed pulmonary metastases. There are at least 7 definitively new lesions. Numerous additional smaller 2 and 3 mm nodules may have been present on prior exams and may represent a benign underlying conditions such as sequelae of old granulomatous disease. No pleural effusion. Musculoskeletal: No acute fracture or aggressive  appearing lytic or blastic osseous lesion. CT ABDOMEN PELVIS FINDINGS Hepatobiliary: Stable 6 mm hypervascular lesion in the anterior aspect of the hepatic dome compared to prior. This likely represents a small flash fill hemangioma. Ill-defined low-attenuation lesion within the caudate lobe of the liver measures approximately 20 x 19 mm (coronal reformatted images) and is concerning for hepatic metastatic disease. Otherwise, noted definite discrete liver lesions identified. The gallbladder is surgically absent. No significant biliary ductal dilatation. Pancreas: Within normal limits. Spleen: Within normal limits. Adrenals/Urinary Tract: Adrenal glands are normal bilaterally. No adrenal nodules. No evidence of hydronephrosis, nephrolithiasis or enhancing renal lesion. Stomach/Bowel: Colonic diverticular disease of the residual sigmoid colon without CT evidence of active inflammation. History of subtotal colectomy with right lower quadrant diverting ileostomy. No focal bowel wall thickening or evidence of obstruction. Vascular/Lymphatic: No free fluid or definitive adenopathy. Reproductive: Interval enlargement of a amorphous enhancing soft tissue adherent to the right anterior aspect of the uterus. This structure today measures approximately 4.1 x 2.7 cm. Precise measurement is somewhat difficult secondary to the relatively a amorphous nature of this lesion. Previously this structure measured approximately 2.6 x 2.0 cm. Dystrophic calcifications within the remainder of the uterus likely represent old degenerated fibroids. Other: No ascites or omental caking. Musculoskeletal: No acute fracture or aggressive appearing lytic or blastic osseous lesion. IMPRESSION: CT CHEST 1. At least 7 new 4-5 mm pulmonary nodules identified scattered throughout both lungs highly concerning for metastatic disease. 2. No acute cardiopulmonary process. CT ABDOMEN/PELVIS 1. Enlarging amorphous enhancing soft tissue lesion adherent to the  right anterior aspect of the uterus concerning for serosal metastasis. The lesion measures approximately 4.1 x 2.7 cm today compared to 2.6 x 2.0 cm on 10/15/2014. 2. Approximately 2 cm low-attenuation lesion within the caudate lobe of the liver concerning for solitary hepatic metastasis. 3. No evidence of ascites, peritoneal nodularity or omental caking to suggest peritoneal carcinomatosis. 4. No definite osseous lesions. 5. No acute abnormality. Electronically Signed   By: Jacqulynn Cadet M.D.   On: 01/06/2015 16:04    Microbiology: No results found for this or any previous visit (from the past 240 hour(s)).   Labs: Basic Metabolic Panel:  Recent Labs Lab 01/05/15 2040 01/06/15 0649  NA 138 137  K 4.5 4.1  CL 107 106  CO2 22 23  GLUCOSE 116* 110*  BUN 31* 21*  CREATININE 0.99 0.90  CALCIUM 10.0 9.8   Liver Function Tests:  Recent Labs Lab 01/05/15 2040 01/06/15 0649  AST 37 35  ALT 27 26  ALKPHOS 135* 133*  BILITOT 0.7 1.0  PROT 7.7 7.6  ALBUMIN 4.6 4.5   No results for input(s): LIPASE, AMYLASE in the last 168 hours. No results for input(s): AMMONIA in the last 168 hours. CBC:  Recent Labs Lab 01/05/15 2040 01/06/15 0649  WBC 6.0 6.8  HGB 12.1 12.5  HCT 36.0 36.7  MCV 101.7* 99.2  PLT 98* 91*   Cardiac  Enzymes: No results for input(s): CKTOTAL, CKMB, CKMBINDEX, TROPONINI in the last 168 hours. BNP: BNP (last 3 results) No results for input(s): BNP in the last 8760 hours.  ProBNP (last 3 results) No results for input(s): PROBNP in the last 8760 hours.  CBG: No results for input(s): GLUCAP in the last 168 hours.     Signed:  JESSICA UPCHURCH VANN  [facility] 01/08/2015, 1:56 PM

## 2015-01-08 NOTE — Progress Notes (Signed)
CM consult for home hospice referral. This CM spoke with pt and husband at bedside to offer choice for home hospice. Pt's questions answered about what services home hospice provides. Pt declines home hospice services at this time and states that she does not need that yet. Home hospice provider list left with pt and pt encouraged to call Dr. Antonieta Pert office to assist her if she decides she would like hospice services. Marney Doctor RN,BSN,NCM [phone]

## 2015-01-08 NOTE — Progress Notes (Signed)
Pt will be leaving this afternoon after a "short nap". Port has been de-accessed. Ostomy bag changed this AM.

## 2015-01-09 ENCOUNTER — Telehealth: Payer: Self-pay | Admitting: Hematology & Oncology

## 2015-01-09 ENCOUNTER — Encounter: Payer: Self-pay | Admitting: Radiation Oncology

## 2015-01-09 ENCOUNTER — Other Ambulatory Visit: Payer: Self-pay | Admitting: Hematology & Oncology

## 2015-01-09 DIAGNOSIS — C186 Malignant neoplasm of descending colon: Secondary | ICD-10-CM

## 2015-01-09 NOTE — Telephone Encounter (Signed)
Per Md order to sch patient for 01/10/15 apt.  Apt was scheduled and patient was called and give apt date and time.  Patient is aware of apt and will be here

## 2015-01-09 NOTE — Progress Notes (Signed)
  Radiation Oncology         [phone] ________________________________  Name: Tamara [name] MRN: [id number]  Date: 01/08/2015  DOB: 07-Sep-1929  SIMULATION AND TREATMENT PLANNING NOTE    ICD-9-CM ICD-10-CM   1. Colon cancer metastasized to multiple sites ([place]) 153.9 C18.9     DIAGNOSIS:  Recurrent colon cancer. The patient has recurrence within Hartman's pouch which is the source of her bleeding.  NARRATIVE:  The patient was brought to the [facility].  Identity was confirmed.  All relevant records and images related to the planned course of therapy were reviewed.  The patient freely provided informed written consent to proceed with treatment after reviewing the details related to the planned course of therapy. The consent form was witnessed and verified by the simulation staff.  Then, the patient was set-up in a stable reproducible  supine position for radiation therapy.  CT images were obtained.  Surface markings were placed.  The CT images were loaded into the planning software.  Then the target and avoidance structures were contoured.  Treatment planning then occurred.  The radiation prescription was entered and confirmed.  Then, I designed and supervised the construction of a total of 5 medically necessary complex treatment devices.  I have requested : 3D Simulation  I have requested a DVH of the following structures: GTV, PTV, bladder, rectum, bowel.  I have ordered:dose calc.  PLAN:  The patient will receive 35 Gy in 14 fractions.  ________________________________  -----------------------------------  Blair Promise, PhD, MD

## 2015-01-10 ENCOUNTER — Ambulatory Visit: Payer: Medicare Other | Admitting: Hematology & Oncology

## 2015-01-10 ENCOUNTER — Ambulatory Visit: Payer: Medicare Other

## 2015-01-10 ENCOUNTER — Encounter: Payer: Self-pay | Admitting: Hematology & Oncology

## 2015-01-10 ENCOUNTER — Telehealth: Payer: Self-pay | Admitting: *Deleted

## 2015-01-10 ENCOUNTER — Other Ambulatory Visit (HOSPITAL_BASED_OUTPATIENT_CLINIC_OR_DEPARTMENT_OTHER): Payer: Medicare Other

## 2015-01-10 ENCOUNTER — Ambulatory Visit (HOSPITAL_BASED_OUTPATIENT_CLINIC_OR_DEPARTMENT_OTHER): Payer: Medicare Other | Admitting: Hematology & Oncology

## 2015-01-10 ENCOUNTER — Other Ambulatory Visit: Payer: Medicare Other

## 2015-01-10 ENCOUNTER — Ambulatory Visit (HOSPITAL_BASED_OUTPATIENT_CLINIC_OR_DEPARTMENT_OTHER): Payer: Medicare Other

## 2015-01-10 VITALS — BP 168/61 | HR 72 | Temp 98.1°F | Resp 16 | Ht 66.0 in | Wt 123.8 lb

## 2015-01-10 DIAGNOSIS — C787 Secondary malignant neoplasm of liver and intrahepatic bile duct: Secondary | ICD-10-CM | POA: Diagnosis not present

## 2015-01-10 DIAGNOSIS — C7800 Secondary malignant neoplasm of unspecified lung: Secondary | ICD-10-CM

## 2015-01-10 DIAGNOSIS — D500 Iron deficiency anemia secondary to blood loss (chronic): Secondary | ICD-10-CM | POA: Diagnosis not present

## 2015-01-10 DIAGNOSIS — Z95828 Presence of other vascular implants and grafts: Secondary | ICD-10-CM

## 2015-01-10 DIAGNOSIS — C184 Malignant neoplasm of transverse colon: Secondary | ICD-10-CM

## 2015-01-10 DIAGNOSIS — C189 Malignant neoplasm of colon, unspecified: Secondary | ICD-10-CM

## 2015-01-10 DIAGNOSIS — C186 Malignant neoplasm of descending colon: Secondary | ICD-10-CM

## 2015-01-10 LAB — CMP (CANCER CENTER ONLY)
ALT: 28 U/L (ref 10–47)
AST: 38 U/L (ref 11–38)
Albumin: 3.6 g/dL (ref 3.3–5.5)
Alkaline Phosphatase: 114 U/L — ABNORMAL HIGH (ref 26–84)
BUN: 26 mg/dL — AB (ref 7–22)
CHLORIDE: 103 meq/L (ref 98–108)
CO2: 24 mEq/L (ref 18–33)
CREATININE: 1 mg/dL (ref 0.6–1.2)
Calcium: 9.4 mg/dL (ref 8.0–10.3)
GLUCOSE: 134 mg/dL — AB (ref 73–118)
Potassium: 4.2 mEq/L (ref 3.3–4.7)
SODIUM: 137 meq/L (ref 128–145)
TOTAL PROTEIN: 7.2 g/dL (ref 6.4–8.1)
Total Bilirubin: 0.7 mg/dl (ref 0.20–1.60)

## 2015-01-10 LAB — CBC WITH DIFFERENTIAL (CANCER CENTER ONLY)
BASO#: 0 10*3/uL (ref 0.0–0.2)
BASO%: 0.5 % (ref 0.0–2.0)
EOS%: 3.4 % (ref 0.0–7.0)
Eosinophils Absolute: 0.2 10*3/uL (ref 0.0–0.5)
HCT: 33.7 % — ABNORMAL LOW (ref 34.8–46.6)
HGB: 11.4 g/dL — ABNORMAL LOW (ref 11.6–15.9)
LYMPH#: 2.2 10*3/uL (ref 0.9–3.3)
LYMPH%: 35 % (ref 14.0–48.0)
MCH: 34.4 pg — AB (ref 26.0–34.0)
MCHC: 33.8 g/dL (ref 32.0–36.0)
MCV: 102 fL — ABNORMAL HIGH (ref 81–101)
MONO#: 0.7 10*3/uL (ref 0.1–0.9)
MONO%: 10.7 % (ref 0.0–13.0)
NEUT#: 3.1 10*3/uL (ref 1.5–6.5)
NEUT%: 50.4 % (ref 39.6–80.0)
PLATELETS: 97 10*3/uL — AB (ref 145–400)
RBC: 3.31 10*6/uL — AB (ref 3.70–5.32)
RDW: 12.4 % (ref 11.1–15.7)
WBC: 6.2 10*3/uL (ref 3.9–10.0)

## 2015-01-10 MED ORDER — SODIUM CHLORIDE 0.9 % IJ SOLN
10.0000 mL | INTRAMUSCULAR | Status: DC | PRN
Start: 2015-01-10 — End: 2015-01-10
  Administered 2015-01-10: 10 mL via INTRAVENOUS
  Filled 2015-01-10: qty 10

## 2015-01-10 MED ORDER — HEPARIN SOD (PORK) LOCK FLUSH 100 UNIT/ML IV SOLN
500.0000 [IU] | Freq: Once | INTRAVENOUS | Status: AC
  Administered 2015-01-10: 500 [IU] via INTRAVENOUS
  Filled 2015-01-10: qty 5

## 2015-01-10 NOTE — Patient Instructions (Signed)

## 2015-01-10 NOTE — Progress Notes (Signed)
Hematology and Oncology Follow Up Visit  OLUWATOBILOBA DRAINE [id number] February 10, 1929 81 y.o. 01/10/2015   Principle Diagnosis:   Recurrent/metastatic colon cancer - Progressive  Current Therapy:     Palliative radiation therapy to the pelvis for rectal bleeding.    Interim History:  Ms. Park is back for follow-up.  She was recently hospitalized. She actually had rectal bleeding. This, despite the fact that she has a colostomy.    she underwent a modified colonoscopy. The gastroenterologist with through her colostomy. He found that there is a mass in the Hartmann's pouch.  Biopsies were not necessary.  She had CAT scans done. Unfortunately, the CAT scans showed that she had new metastatic disease to the lungs, liver, and pelvis. I'm sure that the pelvic mass seen on CT scan is what is being seen on the colonoscopy.   Her  CEA was also markedly elevated.  Her CEA went from a level of 14 in August up to 169.   I talked her in the hospital regarding this situation. She does not want any systemic therapy as she has had a very hard time with systemic chemotherapy. Even with reduced dose chemotherapy, she just had a very poor tolerability. I thought that radiation therapy would be helpful. As such, she currently is to start radiation therapy to the pelvis to palliate any bleeding.   She really has not seen much in the way of bleeding from the rectum since being discharged.   I did talk to the hospital regarding end-of-life issues. I told her that I thought that it the best of circumstances, that she probably  might go 4-6 months.     she does not want to be kept alive aerobically. I totally agree with this. As such, she is a no CODE BLUE.   Her husband has his own set of health problems. He recently had radiation therapy for squamous cell carcinoma of the face.    her appetite remains good. She is able to eat regular food.   She's had no nausea or vomiting.    currently, her performance  status is ECOG 2.   She has had no leg swelling.  Overall, her she's had a very good performance status. Currently, her performance status is ECOG 1..   Medications:  Current outpatient prescriptions:  .  amLODipine (NORVASC) 5 MG tablet, Take 1 tablet (5 mg total) by mouth daily., Disp: 30 tablet, Rfl: 0 .  antiseptic oral rinse (BIOTENE) LIQD, 15 mLs by Mouth Rinse route 5 (five) times daily. After brushing teeth, Disp: , Rfl:  .  b complex vitamins tablet, Take 1 tablet by mouth every other day., Disp: , Rfl:  .  B Complex-C (SUPER B COMPLEX PO), Take 1 tablet by mouth every other day. Alternating days with B complex, Disp: , Rfl:  .  Biotin 5 MG TABS, Take 1 tablet by mouth every morning. , Disp: , Rfl:  .  Calcium-Magnesium-Vitamin D (CALCIUM MAGNESIUM PO), Take 1 tablet by mouth daily., Disp: , Rfl:  .  Cholecalciferol (VITAMIN D) 2000 UNITS tablet, Take 2,000 Units by mouth daily with breakfast., Disp: , Rfl:  .  Dentifrices (BIOTENE DRY MOUTH) PSTE, Place 1 application onto teeth 5 (five) times daily., Disp: , Rfl:  .  diphenoxylate-atropine (LOMOTIL) 2.5-0.025 MG tablet, take 2 tablets by mouth four times a day, Disp: 240 tablet, Rfl: 2 .  feeding supplement, ENSURE COMPLETE, (ENSURE COMPLETE) LIQD, Take 237 mLs by mouth 2 (two) times daily between  meals. *Vanilla*, Disp: , Rfl:  .  lidocaine-prilocaine (EMLA) cream, Apply 1 application topically as needed. Apply to port site one hour before access, Disp: 30 g, Rfl: 0 .  Multiple Vitamin (MULTIVITAMIN WITH MINERALS) TABS tablet, Take 1 tablet by mouth daily., Disp: , Rfl:  .  Multiple Vitamins-Minerals (ANTIOXIDANT VITAMINS PO), Take 1 tablet by mouth daily., Disp: , Rfl:  .  Omega 3 1000 MG CAPS, Take 1 capsule by mouth daily with breakfast. , Disp: , Rfl:  .  Probiotic Product (ALIGN PO), Take 1 capsule by mouth daily with breakfast. , Disp: , Rfl:  .  saccharomyces boulardii (FLORASTOR) 250 MG capsule, Take 1 capsule (250 mg  total) by mouth 2 (two) times daily., Disp: 20 capsule, Rfl: 0 No current facility-administered medications for this visit.  Facility-Administered Medications Ordered in Other Visits:  .  sodium chloride 0.9 % injection 10 mL, 10 mL, Intravenous, PRN, Volanda Napoleon, MD, 10 mL at 01/10/15 1615  Allergies:  Allergies  Allergen Reactions  . Codeine Nausea And Vomiting  . Zofran [Ondansetron Hcl] Nausea And Vomiting  . Ciprofloxacin     Confusion/dizziness/fatigue  . Metronidazole     Confusion/dizziness/fatigue  . Skelaxin [Metaxalone]     Too strong  . Tetracycline Hcl     Not tolerated  . Avelox [Moxifloxacin] Rash    Past Medical History, Surgical history, Social history, and Family History were reviewed and updated.  Review of Systems: As above  Physical Exam:  height is 5\' 6"  (1.676 m) and weight is 123 lb 12.8 oz (56.155 kg). Her oral temperature is 98.1 F (36.7 C). Her blood pressure is 168/61 and her pulse is 72. Her respiration is 16.   Wt Readings from Last 3 Encounters:  01/10/15 123 lb 12.8 oz (56.155 kg)  01/06/15 122 lb 9.2 oz (55.6 kg)  11/14/14 129 lb (58.514 kg)     Well-developed and well-nourished elderly female in no obvious distress. Head and neck exam shows no ocular or oral lesions. There is no palpable cervical or supraclavicular lymph nodes. Lungs are clear. Cardiac exam regular rate and rhythm with no murmurs, rubs or bruits. Abdomen is soft. She has good bowel sounds. There is no fluid wave. There is no palpable liver or spleen tip. There is no obvious abdominal mass. There is no guarding or rebound tenderness. Extremities shows no clubbing, cyanosis or edema. Neurological exam shows no focal neurological deficits. Skin exam shows no rashes, ecchymoses or petechia.  Lab Results  Component Value Date   WBC 6.2 01/10/2015   HGB 11.4* 01/10/2015   HCT 33.7* 01/10/2015   MCV 102* 01/10/2015   PLT 97* 01/10/2015     Chemistry      Component  Value Date/Time   NA 137 01/10/2015 1504   NA 137 01/06/2015 0649   NA 130* 02/25/2013 0835   K 4.2 01/10/2015 1504   K 4.1 01/06/2015 0649   K 5.6* 02/25/2013 0835   CL 103 01/10/2015 1504   CL 106 01/06/2015 0649   CL 102 02/25/2013 0835   CO2 24 01/10/2015 1504   CO2 23 01/06/2015 0649   CO2 25 02/25/2013 0835   BUN 26* 01/10/2015 1504   BUN 21* 01/06/2015 0649   BUN 32* 02/25/2013 0835   CREATININE 1.0 01/10/2015 1504   CREATININE 0.90 01/06/2015 0649   CREATININE 1.07 02/25/2013 0835      Component Value Date/Time   CALCIUM 9.4 01/10/2015 1504   CALCIUM 9.8 01/06/2015  [id number]   CALCIUM 10.1 02/25/2013 0835   ALKPHOS 114* 01/10/2015 1504   ALKPHOS 133* 01/06/2015 0649   AST 38 01/10/2015 1504   AST 35 01/06/2015 0649   ALT 28 01/10/2015 1504   ALT 26 01/06/2015 0649   BILITOT 0.70 01/10/2015 1504   BILITOT 1.0 01/06/2015 0649         Impression and Plan: Ms. Pistilli is 81 year old white female with  Metastatic colon cancer. Her disease clearly is progressing. We have tried her in the past on systemic therapy. She tolerated this incredibly poorly.    for now, I think we have to focus on her quality of life. I think this would be incredibly reasonable area she had her husband both 1 quality of life as a priority.    she will start radiation therapy next week. She'll have 3 weeks.   I told she and her husband that the radiation will help with the rectal bleeding but will not have any impact on the rest of her disease. I think that weight loss will definitely be the key  as to how she is doing.   I spent about 35-40 minutes with them. I have known them for probably 15 years or more.    I reviewed the labs with them. I told her that she really needs to be careful and she does not get dehydrated.   I would like to see her back in about one month. She will have finished radiation. I would not do another scan on her probably until February.     Nile Riggs,  MD 12/16/20164:51 PM

## 2015-01-10 NOTE — Telephone Encounter (Signed)
Pacemaker/ICD form fill out for the cancer center prior to treatments starting faxed

## 2015-01-11 LAB — PREALBUMIN: PREALBUMIN: 22 mg/dL (ref 17–34)

## 2015-01-11 LAB — CEA: CEA: 82.8 ng/mL — AB (ref 0.0–5.0)

## 2015-01-13 ENCOUNTER — Ambulatory Visit: Payer: Medicare Other

## 2015-01-14 ENCOUNTER — Ambulatory Visit: Payer: Medicare Other

## 2015-01-15 ENCOUNTER — Ambulatory Visit
Admit: 2015-01-15 | Discharge: 2015-01-15 | Disposition: A | Discharge status: Home / Self Care | Payer: Medicare Other | Attending: Radiation Oncology | Admitting: Radiation Oncology

## 2015-01-15 ENCOUNTER — Ambulatory Visit: Payer: Medicare Other

## 2015-01-15 DIAGNOSIS — C189 Malignant neoplasm of colon, unspecified: Secondary | ICD-10-CM | POA: Diagnosis present

## 2015-01-15 DIAGNOSIS — Z510 Encounter for antineoplastic radiation therapy: Secondary | ICD-10-CM | POA: Diagnosis not present

## 2015-01-15 NOTE — Progress Notes (Signed)
  Radiation Oncology         [phone] ________________________________  Name: Tamara [name] MRN: [id number]  Date: 01/15/2015  DOB: 08-Sep-1929  Simulation Verification Note    ICD-9-CM ICD-10-CM   1. Colon cancer metastasized to multiple sites ([place]) 153.9 C18.9     Status: outpatient  NARRATIVE: The patient was brought to the treatment unit and placed in the planned treatment position. The clinical setup was verified. Then port films were obtained and uploaded to the radiation oncology medical record software.  The treatment beams were carefully compared against the planned radiation fields. The position location and shape of the radiation fields was reviewed. They targeted volume of tissue appears to be appropriately covered by the radiation beams. Organs at risk appear to be excluded as planned.  Based on my personal review, I approved the simulation verification. The patient's treatment will proceed as planned.  -----------------------------------  Blair Promise, PhD, MD

## 2015-01-16 ENCOUNTER — Ambulatory Visit
Admit: 2015-01-16 | Discharge: 2015-01-16 | Disposition: A | Discharge status: Home / Self Care | Payer: Medicare Other | Attending: Radiation Oncology | Admitting: Radiation Oncology

## 2015-01-16 DIAGNOSIS — C189 Malignant neoplasm of colon, unspecified: Secondary | ICD-10-CM | POA: Diagnosis not present

## 2015-01-16 DIAGNOSIS — Z510 Encounter for antineoplastic radiation therapy: Secondary | ICD-10-CM | POA: Diagnosis not present

## 2015-01-17 ENCOUNTER — Ambulatory Visit
Admit: 2015-01-17 | Discharge: 2015-01-17 | Disposition: A | Discharge status: Home / Self Care | Payer: Medicare Other | Attending: Radiation Oncology | Admitting: Radiation Oncology

## 2015-01-17 ENCOUNTER — Encounter: Payer: Self-pay | Admitting: Hematology & Oncology

## 2015-01-17 DIAGNOSIS — Z510 Encounter for antineoplastic radiation therapy: Secondary | ICD-10-CM | POA: Diagnosis not present

## 2015-01-17 DIAGNOSIS — C189 Malignant neoplasm of colon, unspecified: Secondary | ICD-10-CM | POA: Diagnosis not present

## 2015-01-21 ENCOUNTER — Ambulatory Visit
Admission: RE | Admit: 2015-01-21 | Discharge: 2015-01-21 | Disposition: A | Discharge status: Home / Self Care | Payer: Medicare Other | Source: Ambulatory Visit | Attending: Radiation Oncology | Admitting: Radiation Oncology

## 2015-01-21 ENCOUNTER — Encounter: Payer: Self-pay | Admitting: Radiation Oncology

## 2015-01-21 ENCOUNTER — Ambulatory Visit
Admit: 2015-01-21 | Discharge: 2015-01-21 | Disposition: A | Discharge status: Home / Self Care | Payer: Medicare Other | Attending: Radiation Oncology | Admitting: Radiation Oncology

## 2015-01-21 VITALS — BP 129/55 | HR 71 | Temp 98.1°F | Ht 66.0 in | Wt 130.8 lb

## 2015-01-21 DIAGNOSIS — C189 Malignant neoplasm of colon, unspecified: Secondary | ICD-10-CM

## 2015-01-21 DIAGNOSIS — Z510 Encounter for antineoplastic radiation therapy: Secondary | ICD-10-CM | POA: Diagnosis not present

## 2015-01-21 NOTE — Progress Notes (Signed)
Weekly Management Note Current Dose: 10 Gy  Projected Dose: 35 Gy   Narrative:  The patient presents for routine under treatment assessment.  CBCT/MVCT images/Port film x-rays were reviewed.  The chart was checked.  She reports a decreased energy level, but is still able to complete her normal daily activities. She reports normal bowel liquid movements through her ileostomy. She takes lomotil 4 times daily with most meals as prescribed by Dr. Marin Olp. She reports urinating normally and states she gets up about 3 times nightly to void. She denies pain and nausea. She has no other concerns at this time.  Physical Findings:  Unchanged. Alert an oriented x3.  Vitals:  Filed Vitals:   01/21/15 1451  BP: 129/55  Pulse: 71  Temp: 98.1 F (36.7 C)   Weight:  Wt Readings from Last 3 Encounters:  01/21/15 130 lb 12.8 oz (59.33 kg)  01/10/15 123 lb 12.8 oz (56.155 kg)  01/06/15 122 lb 9.2 oz (55.6 kg)   Lab Results  Component Value Date   WBC 6.2 01/10/2015   HGB 11.4* 01/10/2015   HCT 33.7* 01/10/2015   MCV 102* 01/10/2015   PLT 97* 01/10/2015   Lab Results  Component Value Date   CREATININE 1.0 01/10/2015   BUN 26* 01/10/2015   NA 137 01/10/2015   K 4.2 01/10/2015   CL 103 01/10/2015   CO2 24 01/10/2015    Impression:  The patient is tolerating radiation.  Plan:  Continue treatment as planned.  This document serves as a record of services personally performed by Thea Silversmith, MD. It was created on her behalf by Darcus Austin, a trained medical scribe. The creation of this record is based on the scribe's personal observations and the provider's statements to them. This document has been checked and approved by the attending provider.

## 2015-01-21 NOTE — Progress Notes (Signed)
Pt here for patient teaching.  Pt given Radiation and You booklet and skin care instructions. Pt reports they have not watched the Radiation Therapy Education video, but were given the link to review at home.  Reviewed areas of pertinence such as diarrhea, fatigue, hair loss, nausea and vomiting, sexual and fertility changes, skin changes, urinary and bladder changes, breast swelling, cough, shortness of breath, earaches and taste changes . Pt able to give teach back of to pat skin, use unscented/gentle soap, have Imodium on hand and drink plenty of water. Pt verbalizes understanding of information given and will contact nursing with any questions or concerns.     [email or web address]

## 2015-01-21 NOTE — Progress Notes (Signed)
Tamara [name] presents for her 4th fraction of radiation. She reports a decreased energy level, but is still able to complete her normal daily activities. She reports normal bowel liquid movements through her ileostomy. She takes lomotil 4 times daily with most meals as prescribed by Dr. Marin Olp. She reports urinating normally, and states she gets up about 3 times nightly to void. She denies pain and nausea. She has no other concerns at this time.   BP 129/55 mmHg  Pulse 71  Temp(Src) 98.1 F (36.7 C)  Ht 5\' 6"  (1.676 m)  Wt 130 lb 12.8 oz (59.33 kg)  BMI 21.12 kg/m2   Wt Readings from Last 3 Encounters:  01/21/15 130 lb 12.8 oz (59.33 kg)  01/10/15 123 lb 12.8 oz (56.155 kg)  01/06/15 122 lb 9.2 oz (55.6 kg)

## 2015-01-22 ENCOUNTER — Ambulatory Visit
Admit: 2015-01-22 | Discharge: 2015-01-22 | Disposition: A | Discharge status: Home / Self Care | Payer: Medicare Other | Attending: Radiation Oncology | Admitting: Radiation Oncology

## 2015-01-22 DIAGNOSIS — Z510 Encounter for antineoplastic radiation therapy: Secondary | ICD-10-CM | POA: Diagnosis not present

## 2015-01-22 DIAGNOSIS — C189 Malignant neoplasm of colon, unspecified: Secondary | ICD-10-CM | POA: Diagnosis not present

## 2015-01-23 ENCOUNTER — Ambulatory Visit
Admit: 2015-01-23 | Discharge: 2015-01-23 | Disposition: A | Discharge status: Home / Self Care | Payer: Medicare Other | Attending: Radiation Oncology | Admitting: Radiation Oncology

## 2015-01-23 ENCOUNTER — Encounter: Payer: Self-pay | Admitting: *Deleted

## 2015-01-23 DIAGNOSIS — C189 Malignant neoplasm of colon, unspecified: Secondary | ICD-10-CM | POA: Diagnosis not present

## 2015-01-23 DIAGNOSIS — Z510 Encounter for antineoplastic radiation therapy: Secondary | ICD-10-CM | POA: Diagnosis not present

## 2015-01-23 NOTE — Progress Notes (Signed)
Pt arrived to nursing due to feeling "uneasy"  And that she thought her radiation dose was incorrect.  She wanted to continue to get her radiation treatment tomorrow, and would come in early on Tuesday to see Dr. Sondra Come before hand.

## 2015-01-24 ENCOUNTER — Ambulatory Visit
Admit: 2015-01-24 | Discharge: 2015-01-24 | Disposition: A | Discharge status: Home / Self Care | Payer: Medicare Other | Attending: Radiation Oncology | Admitting: Radiation Oncology

## 2015-01-24 DIAGNOSIS — C189 Malignant neoplasm of colon, unspecified: Secondary | ICD-10-CM | POA: Diagnosis not present

## 2015-01-24 DIAGNOSIS — Z510 Encounter for antineoplastic radiation therapy: Secondary | ICD-10-CM | POA: Diagnosis not present

## 2015-01-28 ENCOUNTER — Encounter: Payer: Self-pay | Admitting: Radiation Oncology

## 2015-01-28 ENCOUNTER — Ambulatory Visit
Admit: 2015-01-28 | Discharge: 2015-01-28 | Disposition: A | Discharge status: Home / Self Care | Payer: Medicare Other | Attending: Radiation Oncology | Admitting: Radiation Oncology

## 2015-01-28 ENCOUNTER — Ambulatory Visit
Admission: RE | Admit: 2015-01-28 | Discharge: 2015-01-28 | Disposition: A | Discharge status: Home / Self Care | Payer: Medicare Other | Source: Ambulatory Visit | Attending: Radiation Oncology | Admitting: Radiation Oncology

## 2015-01-28 VITALS — BP 148/58 | HR 72 | Temp 97.5°F | Resp 16 | Wt 130.4 lb

## 2015-01-28 DIAGNOSIS — C189 Malignant neoplasm of colon, unspecified: Secondary | ICD-10-CM | POA: Diagnosis not present

## 2015-01-28 DIAGNOSIS — Z510 Encounter for antineoplastic radiation therapy: Secondary | ICD-10-CM | POA: Diagnosis not present

## 2015-01-28 NOTE — Progress Notes (Signed)
  Radiation Oncology         [phone] ________________________________  Name: Tamara [name] MRN: [id number]  Date: 01/28/2015  DOB: Apr 02, 1929  Weekly Radiation Therapy Management    ICD-9-CM ICD-10-CM   1. Colon cancer metastasized to multiple sites ([place]) 153.9 C18.9      Current Dose: 20 Gy     Planned Dose:  35 Gy  Narrative . . . . . . . . The patient presents for routine under treatment assessment.                                   The patient is without complaint. She is having some diarrhea which is controlled well with approximately 3-4 lomotil/day.                                 Set-up films were reviewed.                                 The chart was checked. Physical Findings. . .  weight is 130 lb 6.4 oz (59.149 kg). Her oral temperature is 97.5 F (36.4 C). Her blood pressure is 148/58 and her pulse is 72. Her respiration is 16. . The lungs are clear. The heart has a regular rhythm and rate. Pacemaker present in the left upper chest. Colostomy bag in the right lower quadrant. Bowel sounds are normal Impression . . . . . . . The patient is tolerating radiation. Plan . . . . . . . . . . . . Continue treatment as planned.  ________________________________   Blair Promise, PhD, MD

## 2015-01-28 NOTE — Progress Notes (Signed)
Weekly rad tx colon, 8/14 completed, takes lomotil 4x day, emptys colostomy 2-3 x day, no pain, drinks ensure bid, appetite okay,taste not too good stated, energy level fair, up several times to go to the bathroom last night (5) had hot and cold chills, no fever BP 148/58 mmHg  Pulse 72  Temp(Src) 97.5 F (36.4 C) (Oral)  Resp 16  Wt 130 lb 6.4 oz (59.149 kg)  Wt Readings from Last 3 Encounters:  01/28/15 130 lb 6.4 oz (59.149 kg)  01/21/15 130 lb 12.8 oz (59.33 kg)  01/10/15 123 lb 12.8 oz (56.155 kg)  .

## 2015-01-29 ENCOUNTER — Ambulatory Visit
Admit: 2015-01-29 | Discharge: 2015-01-29 | Disposition: A | Discharge status: Home / Self Care | Payer: Medicare Other | Attending: Radiation Oncology | Admitting: Radiation Oncology

## 2015-01-29 DIAGNOSIS — Z510 Encounter for antineoplastic radiation therapy: Secondary | ICD-10-CM | POA: Diagnosis not present

## 2015-01-29 DIAGNOSIS — C189 Malignant neoplasm of colon, unspecified: Secondary | ICD-10-CM | POA: Diagnosis not present

## 2015-01-30 ENCOUNTER — Ambulatory Visit
Admit: 2015-01-30 | Discharge: 2015-01-30 | Disposition: A | Discharge status: Home / Self Care | Payer: Medicare Other | Attending: Radiation Oncology | Admitting: Radiation Oncology

## 2015-01-30 ENCOUNTER — Ambulatory Visit: Payer: Medicare Other

## 2015-01-30 DIAGNOSIS — Z510 Encounter for antineoplastic radiation therapy: Secondary | ICD-10-CM | POA: Diagnosis not present

## 2015-01-30 DIAGNOSIS — C189 Malignant neoplasm of colon, unspecified: Secondary | ICD-10-CM | POA: Diagnosis not present

## 2015-01-31 ENCOUNTER — Ambulatory Visit
Admission: RE | Admit: 2015-01-31 | Discharge: 2015-01-31 | Disposition: A | Discharge status: Home / Self Care | Payer: Medicare Other | Source: Ambulatory Visit | Attending: Radiation Oncology | Admitting: Radiation Oncology

## 2015-01-31 DIAGNOSIS — C189 Malignant neoplasm of colon, unspecified: Secondary | ICD-10-CM | POA: Diagnosis not present

## 2015-01-31 DIAGNOSIS — Z510 Encounter for antineoplastic radiation therapy: Secondary | ICD-10-CM | POA: Diagnosis not present

## 2015-02-03 ENCOUNTER — Ambulatory Visit: Payer: Medicare Other

## 2015-02-03 ENCOUNTER — Ambulatory Visit
Admit: 2015-02-03 | Discharge: 2015-02-03 | Disposition: A | Discharge status: Home / Self Care | Payer: Medicare Other | Attending: Radiation Oncology | Admitting: Radiation Oncology

## 2015-02-03 DIAGNOSIS — Z510 Encounter for antineoplastic radiation therapy: Secondary | ICD-10-CM | POA: Diagnosis not present

## 2015-02-03 DIAGNOSIS — C189 Malignant neoplasm of colon, unspecified: Secondary | ICD-10-CM | POA: Diagnosis not present

## 2015-02-03 NOTE — Progress Notes (Signed)
Patient has a colostomy and reports having 2 episodes this morning where she felt like she had to have a bowel movement.  She passed some mucus that was brown colored and said it was less the second time.  She denies seeing any blood.  Findings reported to Dr. Sondra Come who will see the patient after treatment tomorrow.

## 2015-02-04 ENCOUNTER — Encounter: Payer: Self-pay | Admitting: Radiation Oncology

## 2015-02-04 ENCOUNTER — Ambulatory Visit
Admission: RE | Admit: 2015-02-04 | Discharge: 2015-02-04 | Disposition: A | Discharge status: Home / Self Care | Payer: Medicare Other | Source: Ambulatory Visit | Attending: Radiation Oncology | Admitting: Radiation Oncology

## 2015-02-04 ENCOUNTER — Telehealth: Payer: Self-pay | Admitting: Oncology

## 2015-02-04 ENCOUNTER — Ambulatory Visit: Payer: Medicare Other

## 2015-02-04 ENCOUNTER — Ambulatory Visit
Admit: 2015-02-04 | Discharge: 2015-02-04 | Disposition: A | Discharge status: Home / Self Care | Payer: Medicare Other | Attending: Radiation Oncology | Admitting: Radiation Oncology

## 2015-02-04 VITALS — BP 139/61 | HR 72 | Temp 97.5°F | Resp 18 | Ht 66.0 in | Wt 130.7 lb

## 2015-02-04 DIAGNOSIS — C189 Malignant neoplasm of colon, unspecified: Secondary | ICD-10-CM

## 2015-02-04 DIAGNOSIS — Z510 Encounter for antineoplastic radiation therapy: Secondary | ICD-10-CM | POA: Diagnosis not present

## 2015-02-04 NOTE — Telephone Encounter (Signed)
Flannery called and said she had another episode of passing dark brown mucus through her rectum this morning.  She would like to see Dr. Sondra Come before radiation today.  Advised her to come in at 1:45.  Lizabella verbalized agreement.

## 2015-02-04 NOTE — Progress Notes (Signed)
  Radiation Oncology         [phone] ________________________________  Name: Tamara [name] MRN: [id number]  Date: 02/04/2015  DOB: 1929/12/28  Weekly Radiation Therapy Management  DIAGNOSIS: Recurrent colon cancer  Current Dose: 32.5 Gy     Planned Dose:  35 Gy  Narrative . . . . . . . . The patient presents for routine under treatment assessment.                                   The patient has had a poor appetite and some mild nausea. She's also had some mild diarrhea for which she takes Imodium. She denies any severe abdominal pain or cramping. She has noticed mucus production somewhat brownish in color occasionally presenting through the rectum area. The patient was concerned about this but I discussed with the patient this would be a normal reaction to her radiation therapy with some mucosal sloughing and tumor regression                                 Set-up films were reviewed.                                 The chart was checked. Physical Findings. . .  height is 5\' 6"  (1.676 m) and weight is 130 lb 11.2 oz (59.285 kg). Her oral temperature is 97.5 F (36.4 C). Her blood pressure is 139/61 and her pulse is 72. Her respiration is 18. . Weight essentially stable.  No significant changes. No orthostatic blood pressure changes noted.  The lungs are clear. The heart has a regular rhythm and rate. The abdomen is soft and nontender with normal bowel sounds. Impression . . . . . . . The patient is tolerating radiation. Plan . . . . . . . . . . . . Continue treatment as planned.  ________________________________   Blair Promise, PhD, MD

## 2015-02-04 NOTE — Progress Notes (Signed)
Tamara [name] has completed 12 fractions to her pelvis.  She denies having pain but does report occasional discomfort in her pelvic area.  She reports her appetite is not as good and food does not taste as good.  She is drnking 2 ensures per day.  She reports feeling very fatigued.  She is taking lomotil 2 tablets before meals and at bedtime.  She said the stool in her colostomy bag is formed.  She had 4 more small episodes this morning of dark brown mucus that came from her rectum.  She denies having any vaginal/rectal bleeding.   BP 144/65 mmHg  Pulse 85  Temp(Src) 97.5 F (36.4 C) (Oral)  Resp 18  Ht 5\' 6"  (1.676 m)  Wt 130 lb 11.2 oz (59.285 kg)  BMI 21.11 kg/m2   Wt Readings from Last 3 Encounters:  02/04/15 130 lb 11.2 oz (59.285 kg)  01/28/15 130 lb 6.4 oz (59.149 kg)  01/21/15 130 lb 12.8 oz (59.33 kg)

## 2015-02-05 ENCOUNTER — Ambulatory Visit
Admit: 2015-02-05 | Discharge: 2015-02-05 | Disposition: A | Discharge status: Home / Self Care | Payer: Medicare Other | Attending: Radiation Oncology | Admitting: Radiation Oncology

## 2015-02-05 ENCOUNTER — Encounter: Payer: Self-pay | Admitting: Radiation Oncology

## 2015-02-05 ENCOUNTER — Ambulatory Visit: Payer: Medicare Other

## 2015-02-05 DIAGNOSIS — Z510 Encounter for antineoplastic radiation therapy: Secondary | ICD-10-CM | POA: Diagnosis not present

## 2015-02-05 DIAGNOSIS — C189 Malignant neoplasm of colon, unspecified: Secondary | ICD-10-CM | POA: Diagnosis not present

## 2015-02-06 ENCOUNTER — Telehealth: Payer: Self-pay | Admitting: *Deleted

## 2015-02-06 NOTE — Telephone Encounter (Signed)
Patient is c/o feeling dehydrated. Patient finished radiation yesterday. She feels weak. She is drinking fluids including ensure and c/o weak appetite.   Spoke to Dr Marin Olp and he wants patient to come in for labs and fluids tomorrow afternoon. Patient aware of appointment.

## 2015-02-07 ENCOUNTER — Other Ambulatory Visit (HOSPITAL_BASED_OUTPATIENT_CLINIC_OR_DEPARTMENT_OTHER): Payer: Medicare Other

## 2015-02-07 ENCOUNTER — Ambulatory Visit (HOSPITAL_BASED_OUTPATIENT_CLINIC_OR_DEPARTMENT_OTHER): Payer: Medicare Other

## 2015-02-07 VITALS — BP 109/51 | HR 67 | Temp 98.2°F | Resp 18

## 2015-02-07 DIAGNOSIS — E860 Dehydration: Secondary | ICD-10-CM | POA: Diagnosis present

## 2015-02-07 DIAGNOSIS — R531 Weakness: Secondary | ICD-10-CM | POA: Diagnosis not present

## 2015-02-07 DIAGNOSIS — C184 Malignant neoplasm of transverse colon: Secondary | ICD-10-CM

## 2015-02-07 DIAGNOSIS — C189 Malignant neoplasm of colon, unspecified: Secondary | ICD-10-CM

## 2015-02-07 DIAGNOSIS — C787 Secondary malignant neoplasm of liver and intrahepatic bile duct: Secondary | ICD-10-CM | POA: Diagnosis not present

## 2015-02-07 LAB — CBC WITH DIFFERENTIAL (CANCER CENTER ONLY)
BASO#: 0 10*3/uL (ref 0.0–0.2)
BASO%: 0.6 % (ref 0.0–2.0)
EOS%: 5.2 % (ref 0.0–7.0)
Eosinophils Absolute: 0.3 10*3/uL (ref 0.0–0.5)
HEMATOCRIT: 32.5 % — AB (ref 34.8–46.6)
HEMOGLOBIN: 11.1 g/dL — AB (ref 11.6–15.9)
LYMPH#: 0.9 10*3/uL (ref 0.9–3.3)
LYMPH%: 19.5 % (ref 14.0–48.0)
MCH: 34.7 pg — ABNORMAL HIGH (ref 26.0–34.0)
MCHC: 34.2 g/dL (ref 32.0–36.0)
MCV: 102 fL — ABNORMAL HIGH (ref 81–101)
MONO#: 0.7 10*3/uL (ref 0.1–0.9)
MONO%: 13.5 % — ABNORMAL HIGH (ref 0.0–13.0)
NEUT%: 61.2 % (ref 39.6–80.0)
NEUTROS ABS: 3 10*3/uL (ref 1.5–6.5)
Platelets: 82 10*3/uL — ABNORMAL LOW (ref 145–400)
RBC: 3.2 10*6/uL — ABNORMAL LOW (ref 3.70–5.32)
RDW: 13 % (ref 11.1–15.7)
WBC: 4.8 10*3/uL (ref 3.9–10.0)

## 2015-02-07 LAB — CMP (CANCER CENTER ONLY)
ALK PHOS: 100 U/L — AB (ref 26–84)
ALT: 20 U/L (ref 10–47)
AST: 29 U/L (ref 11–38)
Albumin: 3.5 g/dL (ref 3.3–5.5)
BUN: 31 mg/dL — AB (ref 7–22)
CHLORIDE: 103 meq/L (ref 98–108)
CO2: 23 mEq/L (ref 18–33)
CREATININE: 1.1 mg/dL (ref 0.6–1.2)
Calcium: 9.2 mg/dL (ref 8.0–10.3)
GLUCOSE: 126 mg/dL — AB (ref 73–118)
POTASSIUM: 4.4 meq/L (ref 3.3–4.7)
SODIUM: 138 meq/L (ref 128–145)
Total Bilirubin: 0.6 mg/dl (ref 0.20–1.60)
Total Protein: 7.1 g/dL (ref 6.4–8.1)

## 2015-02-07 MED ORDER — SODIUM CHLORIDE 0.9 % IV SOLN
INTRAVENOUS | Status: DC
  Administered 2015-02-07: 14:00:00 via INTRAVENOUS

## 2015-02-07 NOTE — Patient Instructions (Signed)

## 2015-02-09 NOTE — Progress Notes (Signed)
  Radiation Oncology         ([phone] ________________________________  Name: Tamara [name] MRN: [id number]  Date: 02/05/2015  DOB: 1929/02/19  End of Treatment Note   ICD-9-CM ICD-10-CM    1. Colon cancer metastasized to multiple sites ([place]) 153.9 C18.9     DIAGNOSIS: Recurrent colon cancer. The patient has recurrence within Hartman's pouch which is the source of her bleeding.     Indication for treatment:  bleeding      Radiation treatment dates:   01/15/2015-02/05/2015  Site/dose:   Pelvis/Hartmann's pouch area  Beams/energy:   3-D conformal, 10X, 15x beams  Narrative: The patient tolerated radiation treatment relatively well.   Mild nausea and diarrhea, increased mucus production through the rectum area. No further problems with rectal bleeding  Plan: The patient has completed radiation treatment. The patient will return to radiation oncology clinic for routine followup in one month. I advised them to call or return sooner if they have any questions or concerns related to their recovery or treatment.  -----------------------------------  Blair Promise, PhD, MD

## 2015-02-12 ENCOUNTER — Ambulatory Visit (HOSPITAL_BASED_OUTPATIENT_CLINIC_OR_DEPARTMENT_OTHER): Payer: Medicare Other

## 2015-02-12 ENCOUNTER — Ambulatory Visit (HOSPITAL_BASED_OUTPATIENT_CLINIC_OR_DEPARTMENT_OTHER): Payer: Medicare Other | Admitting: Hematology & Oncology

## 2015-02-12 ENCOUNTER — Other Ambulatory Visit (HOSPITAL_BASED_OUTPATIENT_CLINIC_OR_DEPARTMENT_OTHER): Payer: Medicare Other

## 2015-02-12 ENCOUNTER — Other Ambulatory Visit: Payer: Self-pay | Admitting: Family

## 2015-02-12 ENCOUNTER — Encounter: Payer: Self-pay | Admitting: Hematology & Oncology

## 2015-02-12 VITALS — BP 130/52 | HR 68 | Temp 97.4°F | Resp 16 | Ht 66.0 in | Wt 127.0 lb

## 2015-02-12 DIAGNOSIS — C184 Malignant neoplasm of transverse colon: Secondary | ICD-10-CM | POA: Diagnosis not present

## 2015-02-12 DIAGNOSIS — D500 Iron deficiency anemia secondary to blood loss (chronic): Secondary | ICD-10-CM

## 2015-02-12 DIAGNOSIS — C189 Malignant neoplasm of colon, unspecified: Secondary | ICD-10-CM | POA: Diagnosis not present

## 2015-02-12 DIAGNOSIS — C187 Malignant neoplasm of sigmoid colon: Secondary | ICD-10-CM

## 2015-02-12 LAB — CBC WITH DIFFERENTIAL (CANCER CENTER ONLY)
BASO#: 0 10*3/uL (ref 0.0–0.2)
BASO%: 0.6 % (ref 0.0–2.0)
EOS%: 5.3 % (ref 0.0–7.0)
Eosinophils Absolute: 0.3 10*3/uL (ref 0.0–0.5)
HCT: 33 % — ABNORMAL LOW (ref 34.8–46.6)
HEMOGLOBIN: 11.2 g/dL — AB (ref 11.6–15.9)
LYMPH#: 0.8 10*3/uL — ABNORMAL LOW (ref 0.9–3.3)
LYMPH%: 17.7 % (ref 14.0–48.0)
MCH: 34.7 pg — ABNORMAL HIGH (ref 26.0–34.0)
MCHC: 33.9 g/dL (ref 32.0–36.0)
MCV: 102 fL — ABNORMAL HIGH (ref 81–101)
MONO#: 0.7 10*3/uL (ref 0.1–0.9)
MONO%: 13.7 % — AB (ref 0.0–13.0)
NEUT%: 62.7 % (ref 39.6–80.0)
NEUTROS ABS: 3 10*3/uL (ref 1.5–6.5)
Platelets: 84 10*3/uL — ABNORMAL LOW (ref 145–400)
RBC: 3.23 10*6/uL — ABNORMAL LOW (ref 3.70–5.32)
RDW: 13 % (ref 11.1–15.7)
WBC: 4.7 10*3/uL (ref 3.9–10.0)

## 2015-02-12 LAB — IRON AND TIBC
%SAT: 36 % (ref 21–57)
IRON: 115 ug/dL (ref 41–142)
TIBC: 321 ug/dL (ref 236–444)
UIBC: 206 ug/dL (ref 120–384)

## 2015-02-12 LAB — CMP (CANCER CENTER ONLY)
ALBUMIN: 3.6 g/dL (ref 3.3–5.5)
ALT(SGPT): 29 U/L (ref 10–47)
AST: 36 U/L (ref 11–38)
Alkaline Phosphatase: 104 U/L — ABNORMAL HIGH (ref 26–84)
BUN, Bld: 25 mg/dL — ABNORMAL HIGH (ref 7–22)
CHLORIDE: 107 meq/L (ref 98–108)
CO2: 25 meq/L (ref 18–33)
Calcium: 9.3 mg/dL (ref 8.0–10.3)
Creat: 1 mg/dl (ref 0.6–1.2)
Glucose, Bld: 92 mg/dL (ref 73–118)
POTASSIUM: 4.5 meq/L (ref 3.3–4.7)
Sodium: 142 mEq/L (ref 128–145)
TOTAL PROTEIN: 7.2 g/dL (ref 6.4–8.1)
Total Bilirubin: 0.7 mg/dl (ref 0.20–1.60)

## 2015-02-12 LAB — FERRITIN: FERRITIN: 59 ng/mL (ref 9–269)

## 2015-02-12 MED ORDER — METHYLPREDNISOLONE SODIUM SUCC 125 MG IJ SOLR
125.0000 mg | Freq: Once | INTRAMUSCULAR | Status: AC
  Administered 2015-02-12: 125 mg via INTRAVENOUS

## 2015-02-12 MED ORDER — METHYLPREDNISOLONE SODIUM SUCC 125 MG IJ SOLR
INTRAMUSCULAR | Status: AC
  Filled 2015-02-12: qty 2

## 2015-02-12 MED ORDER — SODIUM CHLORIDE 0.9 % IV SOLN
Freq: Once | INTRAVENOUS | Status: AC
  Administered 2015-02-12: 11:00:00 via INTRAVENOUS

## 2015-02-12 NOTE — Patient Instructions (Signed)

## 2015-02-12 NOTE — Progress Notes (Signed)
Hematology and Oncology Follow Up Visit  Tamara [name] [id number] May 27, 1929 82 y.o. 02/12/2015   Principle Diagnosis:   Recurrent/metastatic colon cancer - Progressive  Current Therapy:     Status post palliative radiation therapy to the pelvis for rectal bleeding.    Interim History:  Tamara [name] is back for follow-up. She is doing better. She completed radiation therapy about a week ago. She tolerated this well area in her she's having no further bleeding. She is having some occasional mucus discharge from the rectum.  Her colostomy is working quite well. She does feel tired. She has no pain. Her appetite is good. She's had no nausea or vomiting. She had no cough. She's had no rashes. She's had no headache. She says her tongue is a little bit sore. I told her to try some B complex vitamins. I will also check some iron studies on her.  Her husband needs cataract surgery. This probably will not be until March.  Overall, her performance status is ECOG 2.    currently, her performance status is ECOG 2   Medications:  Current outpatient prescriptions:  .  amLODipine (NORVASC) 5 MG tablet, Take 1 tablet (5 mg total) by mouth daily., Disp: 30 tablet, Rfl: 0 .  antiseptic oral rinse (BIOTENE) LIQD, 15 mLs by Mouth Rinse route 5 (five) times daily. After brushing teeth, Disp: , Rfl:  .  b complex vitamins tablet, Take 1 tablet by mouth every other day., Disp: , Rfl:  .  B Complex-C (SUPER B COMPLEX PO), Take 1 tablet by mouth every other day. Alternating days with B complex, Disp: , Rfl:  .  Biotin 5 MG TABS, Take 1 tablet by mouth every morning. , Disp: , Rfl:  .  Calcium-Magnesium-Vitamin D (CALCIUM MAGNESIUM PO), Take 1 tablet by mouth daily., Disp: , Rfl:  .  Cholecalciferol (VITAMIN D) 2000 UNITS tablet, Take 2,000 Units by mouth daily with breakfast., Disp: , Rfl:  .  Dentifrices (BIOTENE DRY MOUTH) PSTE, Place 1 application onto teeth 5 (five) times daily., Disp: , Rfl:  .   diphenoxylate-atropine (LOMOTIL) 2.5-0.025 MG tablet, take 2 tablets by mouth four times a day, Disp: 240 tablet, Rfl: 2 .  EQL EVENING PRIMROSE OIL PO, Take 1,300 mg by mouth daily., Disp: , Rfl:  .  feeding supplement, ENSURE COMPLETE, (ENSURE COMPLETE) LIQD, Take 237 mLs by mouth 2 (two) times daily between meals. *Vanilla*, Disp: , Rfl:  .  lidocaine-prilocaine (EMLA) cream, Apply 1 application topically as needed. Apply to port site one hour before access, Disp: 30 g, Rfl: 0 .  Multiple Vitamin (MULTIVITAMIN WITH MINERALS) TABS tablet, Take 1 tablet by mouth daily., Disp: , Rfl:  .  Multiple Vitamins-Minerals (ANTIOXIDANT VITAMINS PO), Take 1 tablet by mouth daily., Disp: , Rfl:  .  NON FORMULARY, Take 1,230 mg by mouth daily. HSN-W, CAPSULES  HERBAL, Disp: , Rfl:  .  Omega 3 1000 MG CAPS, Take 1 capsule by mouth daily with breakfast. , Disp: , Rfl:  .  Probiotic Product (ALIGN PO), Take 1 capsule by mouth daily with breakfast. , Disp: , Rfl:  .  saccharomyces boulardii (FLORASTOR) 250 MG capsule, Take 1 capsule (250 mg total) by mouth 2 (two) times daily., Disp: 20 capsule, Rfl: 0  Allergies:  Allergies  Allergen Reactions  . Codeine Nausea And Vomiting  . Zofran [Ondansetron Hcl] Nausea And Vomiting  . Ciprofloxacin     Confusion/dizziness/fatigue  . Metronidazole     Confusion/dizziness/fatigue  .  Skelaxin [Metaxalone]     Too strong  . Tetracycline Hcl     Not tolerated  . Avelox [Moxifloxacin] Rash    Past Medical History, Surgical history, Social history, and Family History were reviewed and updated.  Review of Systems: As above  Physical Exam:  height is 5\' 6"  (1.676 m) and weight is 127 lb (57.607 kg). Her oral temperature is 97.4 F (36.3 C). Her blood pressure is 130/52 and her pulse is 68. Her respiration is 16.   Wt Readings from Last 3 Encounters:  02/12/15 127 lb (57.607 kg)  02/04/15 130 lb 11.2 oz (59.285 kg)  01/28/15 130 lb 6.4 oz (59.149 kg)      Well-developed and well-nourished elderly female in no obvious distress. Head and neck exam shows no ocular or oral lesions. There is no palpable cervical or supraclavicular lymph nodes. Lungs are clear. Cardiac exam regular rate and rhythm with no murmurs, rubs or bruits. Abdomen is soft. She has good bowel sounds. There is no fluid wave. There is no palpable liver or spleen tip. There is no obvious abdominal mass. There is no guarding or rebound tenderness. Extremities shows no clubbing, cyanosis or edema. Neurological exam shows no focal neurological deficits. Skin exam shows no rashes, ecchymoses or petechia.  Lab Results  Component Value Date   WBC 4.7 02/12/2015   HGB 11.2* 02/12/2015   HCT 33.0* 02/12/2015   MCV 102* 02/12/2015   PLT 84* 02/12/2015     Chemistry      Component Value Date/Time   NA 142 02/12/2015 0937   NA 137 01/06/2015 0649   NA 130* 02/25/2013 0835   K 4.5 02/12/2015 0937   K 4.1 01/06/2015 0649   K 5.6* 02/25/2013 0835   CL 107 02/12/2015 0937   CL 106 01/06/2015 0649   CL 102 02/25/2013 0835   CO2 25 02/12/2015 0937   CO2 23 01/06/2015 0649   CO2 25 02/25/2013 0835   BUN 25* 02/12/2015 0937   BUN 21* 01/06/2015 0649   BUN 32* 02/25/2013 0835   CREATININE 1.0 02/12/2015 0937   CREATININE 0.90 01/06/2015 0649   CREATININE 1.07 02/25/2013 0835      Component Value Date/Time   CALCIUM 9.3 02/12/2015 0937   CALCIUM 9.8 01/06/2015 0649   CALCIUM 10.1 02/25/2013 0835   ALKPHOS 104* 02/12/2015 0937   ALKPHOS 133* 01/06/2015 0649   AST 36 02/12/2015 0937   AST 35 01/06/2015 0649   ALT 29 02/12/2015 0937   ALT 26 01/06/2015 0649   BILITOT 0.70 02/12/2015 0937   BILITOT 1.0 01/06/2015 0649         Impression and Plan: Tamara [name] is 82 year old white female with  Metastatic colon cancer. Her disease clearly is progressing. We have tried her in the past on systemic therapy. She tolerated this incredibly poorly.   For now, I think we have to  focus on her quality of life. I think this would be incredibly reasonable .she and her husband both want quality of life as a priority.    I'm glad that she got through radiation therapy well. She finished this on general 13th.  We will go ahead and set up a CT scan in about 3 weeks.  We will go ahead and give her IV fluids today. I think this will help her get through the weekend. I'll also give her a little bit of prednisone IV.  I will plan to see her back in one month, she was  come in sooner for IV fluids necessary           .Volanda Napoleon, MD 1/18/201711:19 AM

## 2015-02-13 LAB — PREALBUMIN: PREALBUMIN: 20 mg/dL (ref 9–32)

## 2015-02-13 LAB — CEA (PARALLEL TESTING): CEA: 85.8 ng/mL — AB (ref 0.0–5.0)

## 2015-02-13 LAB — CEA: CEA: 197.7 ng/mL — ABNORMAL HIGH (ref 0.0–4.7)

## 2015-03-06 ENCOUNTER — Encounter (HOSPITAL_COMMUNITY): Payer: Self-pay

## 2015-03-06 ENCOUNTER — Ambulatory Visit (HOSPITAL_COMMUNITY)
Admission: RE | Admit: 2015-03-06 | Discharge: 2015-03-06 | Disposition: A | Discharge status: Home / Self Care | Payer: Medicare Other | Source: Ambulatory Visit | Attending: Hematology & Oncology | Admitting: Hematology & Oncology

## 2015-03-06 DIAGNOSIS — Z853 Personal history of malignant neoplasm of breast: Secondary | ICD-10-CM | POA: Diagnosis not present

## 2015-03-06 DIAGNOSIS — C187 Malignant neoplasm of sigmoid colon: Secondary | ICD-10-CM | POA: Diagnosis present

## 2015-03-06 DIAGNOSIS — C787 Secondary malignant neoplasm of liver and intrahepatic bile duct: Secondary | ICD-10-CM | POA: Diagnosis not present

## 2015-03-06 DIAGNOSIS — Z85040 Personal history of malignant carcinoid tumor of rectum: Secondary | ICD-10-CM | POA: Diagnosis not present

## 2015-03-06 DIAGNOSIS — Z85048 Personal history of other malignant neoplasm of rectum, rectosigmoid junction, and anus: Secondary | ICD-10-CM | POA: Diagnosis not present

## 2015-03-06 DIAGNOSIS — C7951 Secondary malignant neoplasm of bone: Secondary | ICD-10-CM | POA: Insufficient documentation

## 2015-03-06 DIAGNOSIS — D500 Iron deficiency anemia secondary to blood loss (chronic): Secondary | ICD-10-CM

## 2015-03-06 DIAGNOSIS — Z923 Personal history of irradiation: Secondary | ICD-10-CM | POA: Insufficient documentation

## 2015-03-06 MED ORDER — IOHEXOL 300 MG/ML  SOLN
100.0000 mL | Freq: Once | INTRAMUSCULAR | Status: AC | PRN
  Administered 2015-03-06: 100 mL via INTRAVENOUS

## 2015-03-12 ENCOUNTER — Other Ambulatory Visit: Payer: Self-pay | Admitting: Family

## 2015-03-19 ENCOUNTER — Encounter: Payer: Self-pay | Admitting: Oncology

## 2015-03-20 ENCOUNTER — Ambulatory Visit
Admission: RE | Admit: 2015-03-20 | Discharge: 2015-03-20 | Disposition: A | Discharge status: Home / Self Care | Payer: Medicare Other | Source: Ambulatory Visit | Attending: Radiation Oncology | Admitting: Radiation Oncology

## 2015-03-20 ENCOUNTER — Encounter: Payer: Self-pay | Admitting: Radiation Oncology

## 2015-03-20 VITALS — BP 161/64 | HR 81 | Temp 97.8°F | Resp 16 | Ht 66.0 in | Wt 130.4 lb

## 2015-03-20 DIAGNOSIS — C189 Malignant neoplasm of colon, unspecified: Secondary | ICD-10-CM

## 2015-03-20 DIAGNOSIS — C187 Malignant neoplasm of sigmoid colon: Secondary | ICD-10-CM

## 2015-03-20 NOTE — Progress Notes (Signed)
Radiation Oncology         [phone] ________________________________  Name: Tamara [name] MRN: [id number]  Date: 03/20/2015  DOB: [birth year]  Follow-Up Visit Note  CC: Volanda Napoleon, MD  Volanda Napoleon, MD   Diagnosis: Recurrent colon cancer. The patient has recurrence within Hartman's pouch which is the source of her bleeding.  Interval Since Last Radiation: 6 weeks   01/15/2015-02/05/2015: 35 Gy to the Pelvis/Hartmann's pouch area  The patient was diagnosed with intraductal carcinoma of the right breast 2008. She received radiation therapy as part of this diagnosis. Patient also was diagnosed with stage II left breast cancer and underwent adjuvant chemotherapy as well as breast conserving therapy including radiation therapy. This was performed in 2000.  Narrative:  The patient returns today for routine follow-up. She denies pain or bladder issues. She reports having clear mucus from her rectum the Saturday after the last radiation treatment. She has not had any since. She denies any rectal/vaginal bleeding. She reports having a good appetite and denies having nausea. She denies having fatigue or skin irritation. She reports the stool in her colostomy bag is neither hard nor "runny."  ALLERGIES:  is allergic to codeine; zofran; ciprofloxacin; metronidazole; skelaxin; tetracycline hcl; and avelox.  Meds: Current Outpatient Prescriptions  Medication Sig Dispense Refill  . antiseptic oral rinse (BIOTENE) LIQD 15 mLs by Mouth Rinse route 5 (five) times daily. After brushing teeth    . b complex vitamins tablet Take 1 tablet by mouth every other day.    . B Complex-C (SUPER B COMPLEX PO) Take 1 tablet by mouth every other day. Alternating days with B complex    . Biotin 5 MG TABS Take 1 tablet by mouth every morning.     . Calcium-Magnesium-Vitamin D (CALCIUM MAGNESIUM PO) Take 1 tablet by mouth daily.    . Cholecalciferol (VITAMIN D) 2000 UNITS tablet Take 2,000 Units by  mouth daily with breakfast.    . Dentifrices (BIOTENE DRY MOUTH) PSTE Place 1 application onto teeth 5 (five) times daily.    . diphenoxylate-atropine (LOMOTIL) 2.5-0.025 MG tablet take 2 tablets by mouth four times a day 240 tablet 2  . EQL EVENING PRIMROSE OIL PO Take 1,300 mg by mouth daily.    . feeding supplement, ENSURE COMPLETE, (ENSURE COMPLETE) LIQD Take 237 mLs by mouth 2 (two) times daily between meals. *Vanilla*    . lidocaine-prilocaine (EMLA) cream Apply 1 application topically as needed. Apply to port site one hour before access 30 g 0  . Multiple Vitamin (MULTIVITAMIN WITH MINERALS) TABS tablet Take 1 tablet by mouth daily.    . Multiple Vitamins-Minerals (ANTIOXIDANT VITAMINS PO) Take 1 tablet by mouth daily.    . NON FORMULARY Take 1,230 mg by mouth daily. HSN-W, CAPSULES  HERBAL    . Omega 3 1000 MG CAPS Take 1 capsule by mouth daily with breakfast.     . Probiotic Product (ALIGN PO) Take 1 capsule by mouth daily with breakfast.     . saccharomyces boulardii (FLORASTOR) 250 MG capsule Take 1 capsule (250 mg total) by mouth 2 (two) times daily. 20 capsule 0  . amLODipine (NORVASC) 5 MG tablet Take 1 tablet (5 mg total) by mouth daily. (Patient not taking: Reported on 03/20/2015) 30 tablet 0   No current facility-administered medications for this encounter.    Physical Findings: The patient is in no acute distress. Patient is alert and oriented.  height is 5\' 6"  (1.676 m) and weight is 130  lb 6.4 oz (59.149 kg). Her oral temperature is 97.8 F (36.6 C). Her blood pressure is 161/64 and her pulse is 81. Her respiration is 16.    Lungs are clear to auscultation bilaterally. Heart has regular rate and rhythm. No palpable cervical, supraclavicular, or axillary adenopathy. Abdomen soft, non-tender. Normal bowel sounds.  Lab Findings: Lab Results  Component Value Date   WBC 4.7 02/12/2015   HGB 11.2* 02/12/2015   HCT 33.0* 02/12/2015   MCV 102* 02/12/2015   PLT 84* 02/12/2015     Radiographic Findings: Ct Chest W Contrast  03/06/2015  CLINICAL DATA:  History of rectal carcinoma and breast cancer. EXAM: CT CHEST, ABDOMEN, AND PELVIS WITH CONTRAST TECHNIQUE: Multidetector CT imaging of the chest, abdomen and pelvis was performed following the standard protocol during bolus administration of intravenous contrast. CONTRAST:  110mL OMNIPAQUE IOHEXOL 300 MG/ML  SOLN COMPARISON:  01/06/2015 FINDINGS: CT CHEST Mediastinum: Normal heart size. There is no pericardial effusion identified. The trachea is patent and midline. Normal appearance of the esophagus. No axillary or supraclavicular adenopathy. Lungs/Pleura: No pleural effusion identified. Again noted are multiple pulmonary nodules scattered throughout both lungs. Index nodule in the medial right lung apex measures 5 mm, image 13 of series 5. Previously 4 mm. Left apical nodule measures 4 mm, image 14 of series 5. Previously 5 mm. The right upper lobe lung nodule measures 5 mm, image 21 of series 5. Previously 5 mm. Index nodule within the lingula measures 5 mm, image 32 of series 5. Previously this measured the same. No new or enlarging nodules identified. Musculoskeletal: Mild scoliosis. Spondylosis identified within the thoracic spine. CT ABDOMEN AND PELVIS Hepatobiliary: There is a low-attenuation structure within the caudate lobe of liver measuring 2.2 cm, image 58 of series 2. Previously 1.2 cm. Suspicious for metastasis. Adjacent low density structure is also new from previous exam and is new measuring 9 mm, image 57 of series 2. Pancreas: Negative Spleen: Negative Adrenals/Urinary Tract: The adrenal glands are normal. Normal appearance of both kidneys. Urinary bladder is unremarkable. Stomach/Bowel: The stomach is normal. The small bowel loops have a normal course and caliber. Right lower quadrant colostomy is noted and is patent. Normal unremarkable appearance of the Hartman's pouch. Vascular/Lymphatic: Atherosclerotic disease. No  enlarged upper abdominal lymph nodes. Reproductive: Calcified fibroids identified.  No adnexal mass. Other: There is no ascites or focal fluid collections within the abdomen or pelvis. Musculoskeletal: No aggressive lytic or sclerotic bone lesions. IMPRESSION: 1. Stable appearance of pulmonary metastasis. 2. Mild progression of liver metastases. Electronically Signed   By: Kerby Moors M.D.   On: 03/06/2015 11:04   Ct Abdomen Pelvis W Contrast  03/06/2015  CLINICAL DATA:  History of rectal carcinoma and breast cancer. EXAM: CT CHEST, ABDOMEN, AND PELVIS WITH CONTRAST TECHNIQUE: Multidetector CT imaging of the chest, abdomen and pelvis was performed following the standard protocol during bolus administration of intravenous contrast. CONTRAST:  153mL OMNIPAQUE IOHEXOL 300 MG/ML  SOLN COMPARISON:  01/06/2015 FINDINGS: CT CHEST Mediastinum: Normal heart size. There is no pericardial effusion identified. The trachea is patent and midline. Normal appearance of the esophagus. No axillary or supraclavicular adenopathy. Lungs/Pleura: No pleural effusion identified. Again noted are multiple pulmonary nodules scattered throughout both lungs. Index nodule in the medial right lung apex measures 5 mm, image 13 of series 5. Previously 4 mm. Left apical nodule measures 4 mm, image 14 of series 5. Previously 5 mm. The right upper lobe lung nodule measures 5 mm, image 21  of series 5. Previously 5 mm. Index nodule within the lingula measures 5 mm, image 32 of series 5. Previously this measured the same. No new or enlarging nodules identified. Musculoskeletal: Mild scoliosis. Spondylosis identified within the thoracic spine. CT ABDOMEN AND PELVIS Hepatobiliary: There is a low-attenuation structure within the caudate lobe of liver measuring 2.2 cm, image 58 of series 2. Previously 1.2 cm. Suspicious for metastasis. Adjacent low density structure is also new from previous exam and is new measuring 9 mm, image 57 of series 2.  Pancreas: Negative Spleen: Negative Adrenals/Urinary Tract: The adrenal glands are normal. Normal appearance of both kidneys. Urinary bladder is unremarkable. Stomach/Bowel: The stomach is normal. The small bowel loops have a normal course and caliber. Right lower quadrant colostomy is noted and is patent. Normal unremarkable appearance of the Hartman's pouch. Vascular/Lymphatic: Atherosclerotic disease. No enlarged upper abdominal lymph nodes. Reproductive: Calcified fibroids identified.  No adnexal mass. Other: There is no ascites or focal fluid collections within the abdomen or pelvis. Musculoskeletal: No aggressive lytic or sclerotic bone lesions. IMPRESSION: 1. Stable appearance of pulmonary metastasis. 2. Mild progression of liver metastases. Electronically Signed   By: Kerby Moors M.D.   On: 03/06/2015 11:04    Impression:  The patient is recovering from the effects of radiation. No further bleeding since the patient completed her palliative radiation therapy directed Hartman's pouch.  Plan: She will see me on a PRN basis and continue follow up with medical oncology. She is scheduled for an infusion tomorrow per Dr. Marin Olp. ____________________________________  Blair Promise, PhD, MD  This document serves as a record of services personally performed by Gery Pray, MD. It was created on his behalf by Darcus Austin, a trained medical scribe. The creation of this record is based on the scribe's personal observations and the provider's statements to them. This document has been checked and approved by the attending provider.

## 2015-03-20 NOTE — Progress Notes (Signed)
Tamara [name] here for follow up.  She denies having any pain or bladder issues.  She reports having clear mucus from her rectum the Saturday after the last radiation.  She has not had any since.  She denies having any rectal/vaginal bleeding.  She reports having a good appetite and denies having nausea.  She denies having fatigue or skin irritation.    BP 161/64 mmHg  Pulse 81  Temp(Src) 97.8 F (36.6 C) (Oral)  Resp 16  Ht 5\' 6"  (1.676 m)  Wt 130 lb 6.4 oz (59.149 kg)  BMI 21.06 kg/m2   Wt Readings from Last 3 Encounters:  03/20/15 130 lb 6.4 oz (59.149 kg)  02/12/15 127 lb (57.607 kg)  02/04/15 130 lb 11.2 oz (59.285 kg)

## 2015-03-21 ENCOUNTER — Encounter: Payer: Self-pay | Admitting: Hematology & Oncology

## 2015-03-21 ENCOUNTER — Other Ambulatory Visit (HOSPITAL_BASED_OUTPATIENT_CLINIC_OR_DEPARTMENT_OTHER): Payer: Medicare Other

## 2015-03-21 ENCOUNTER — Ambulatory Visit (HOSPITAL_BASED_OUTPATIENT_CLINIC_OR_DEPARTMENT_OTHER): Payer: Medicare Other

## 2015-03-21 ENCOUNTER — Ambulatory Visit (HOSPITAL_BASED_OUTPATIENT_CLINIC_OR_DEPARTMENT_OTHER): Payer: Medicare Other | Admitting: Hematology & Oncology

## 2015-03-21 VITALS — BP 135/58 | HR 68 | Temp 97.4°F | Resp 14 | Ht 66.0 in | Wt 130.0 lb

## 2015-03-21 DIAGNOSIS — C787 Secondary malignant neoplasm of liver and intrahepatic bile duct: Secondary | ICD-10-CM

## 2015-03-21 DIAGNOSIS — C187 Malignant neoplasm of sigmoid colon: Secondary | ICD-10-CM

## 2015-03-21 DIAGNOSIS — C189 Malignant neoplasm of colon, unspecified: Secondary | ICD-10-CM

## 2015-03-21 DIAGNOSIS — C7800 Secondary malignant neoplasm of unspecified lung: Secondary | ICD-10-CM

## 2015-03-21 DIAGNOSIS — D500 Iron deficiency anemia secondary to blood loss (chronic): Secondary | ICD-10-CM | POA: Diagnosis not present

## 2015-03-21 DIAGNOSIS — C184 Malignant neoplasm of transverse colon: Secondary | ICD-10-CM | POA: Diagnosis not present

## 2015-03-21 LAB — CMP (CANCER CENTER ONLY)
ALBUMIN: 3.6 g/dL (ref 3.3–5.5)
ALT(SGPT): 26 U/L (ref 10–47)
AST: 38 U/L (ref 11–38)
Alkaline Phosphatase: 112 U/L — ABNORMAL HIGH (ref 26–84)
BUN, Bld: 21 mg/dL (ref 7–22)
CHLORIDE: 104 meq/L (ref 98–108)
CO2: 27 mEq/L (ref 18–33)
Calcium: 9.8 mg/dL (ref 8.0–10.3)
Creat: 1 mg/dl (ref 0.6–1.2)
Glucose, Bld: 93 mg/dL (ref 73–118)
POTASSIUM: 4.4 meq/L (ref 3.3–4.7)
Sodium: 138 mEq/L (ref 128–145)
TOTAL PROTEIN: 6.9 g/dL (ref 6.4–8.1)
Total Bilirubin: 0.8 mg/dl (ref 0.20–1.60)

## 2015-03-21 LAB — CBC WITH DIFFERENTIAL (CANCER CENTER ONLY)
BASO#: 0 10*3/uL (ref 0.0–0.2)
BASO%: 0.5 % (ref 0.0–2.0)
EOS ABS: 0.1 10*3/uL (ref 0.0–0.5)
EOS%: 2.2 % (ref 0.0–7.0)
HCT: 31.8 % — ABNORMAL LOW (ref 34.8–46.6)
HEMOGLOBIN: 10.9 g/dL — AB (ref 11.6–15.9)
LYMPH#: 0.7 10*3/uL — ABNORMAL LOW (ref 0.9–3.3)
LYMPH%: 18.1 % (ref 14.0–48.0)
MCH: 35.2 pg — AB (ref 26.0–34.0)
MCHC: 34.3 g/dL (ref 32.0–36.0)
MCV: 103 fL — AB (ref 81–101)
MONO#: 0.6 10*3/uL (ref 0.1–0.9)
MONO%: 14.4 % — ABNORMAL HIGH (ref 0.0–13.0)
NEUT#: 2.6 10*3/uL (ref 1.5–6.5)
NEUT%: 64.8 % (ref 39.6–80.0)
PLATELETS: 91 10*3/uL — AB (ref 145–400)
RBC: 3.1 10*6/uL — AB (ref 3.70–5.32)
RDW: 13.4 % (ref 11.1–15.7)
WBC: 4 10*3/uL (ref 3.9–10.0)

## 2015-03-21 LAB — IRON AND TIBC
%SAT: 34 % (ref 21–57)
IRON: 117 ug/dL (ref 41–142)
TIBC: 348 ug/dL (ref 236–444)
UIBC: 230 ug/dL (ref 120–384)

## 2015-03-21 LAB — FERRITIN: Ferritin: 34 ng/ml (ref 9–269)

## 2015-03-21 MED ORDER — SODIUM CHLORIDE 0.9% FLUSH
10.0000 mL | INTRAVENOUS | Status: DC | PRN
  Administered 2015-03-21: 10 mL via INTRAVENOUS
  Filled 2015-03-21: qty 10

## 2015-03-21 MED ORDER — HEPARIN SOD (PORK) LOCK FLUSH 100 UNIT/ML IV SOLN
500.0000 [IU] | Freq: Once | INTRAVENOUS | Status: AC
  Administered 2015-03-21: 500 [IU] via INTRAVENOUS
  Filled 2015-03-21: qty 5

## 2015-03-21 MED ORDER — SODIUM CHLORIDE 0.9 % IV SOLN
INTRAVENOUS | Status: DC
  Administered 2015-03-21: 09:00:00 via INTRAVENOUS

## 2015-03-21 NOTE — Patient Instructions (Signed)

## 2015-03-21 NOTE — Progress Notes (Signed)
Hematology and Oncology Follow Up Visit  Tamara [name] [id number] May 10, 1929 82 y.o. 03/21/2015   Principle Diagnosis:   Recurrent/metastatic colon cancer - Progressive  Current Therapy:     Status post palliative radiation therapy to the pelvis for rectal bleeding.    Interim History:  Tamara [name] is back for follow-up. She looks quite good. She really has no specific complaints. She saw Dr. Sondra Come yesterday. Everything looked good from his point of view. She's had no further bleeding. She tolerated radiation quite well.  We did go and do a CT scan on her. No surprise, the CT scan does show some slight progression of liver metastases. Her pulmonary metastasis are stable. She has no new disease.  Her last tumor marker, CEA, was stable. It was 86. With the new system, the CEA was 200. We will see what it is this time.  She's had a great performance status. She's been active. She's been driving. She's been eating well. She is still taking her husband who had radiation treatments for skin cancer.  She does [place] fluids on occasion. We will give [place] IV fluids today to help get her through the weekend so she can enjoy the weekend.    currently, her performance status is ECOG 2   Medications:  Current outpatient prescriptions:  .  amLODipine (NORVASC) 5 MG tablet, Take 1 tablet (5 mg total) by mouth daily. (Patient not taking: Reported on 03/20/2015), Disp: 30 tablet, Rfl: 0 .  antiseptic oral rinse (BIOTENE) LIQD, 15 mLs by Mouth Rinse route 5 (five) times daily. After brushing teeth, Disp: , Rfl:  .  b complex vitamins tablet, Take 1 tablet by mouth every other day., Disp: , Rfl:  .  B Complex-C (SUPER B COMPLEX PO), Take 1 tablet by mouth every other day. Alternating days with B complex, Disp: , Rfl:  .  Biotin 5 MG TABS, Take 1 tablet by mouth every morning. , Disp: , Rfl:  .  Calcium-Magnesium-Vitamin D (CALCIUM MAGNESIUM PO), Take 1 tablet by mouth daily., Disp: , Rfl:  .   Cholecalciferol (VITAMIN D) 2000 UNITS tablet, Take 2,000 Units by mouth daily with breakfast., Disp: , Rfl:  .  Dentifrices (BIOTENE DRY MOUTH) PSTE, Place 1 application onto teeth 5 (five) times daily., Disp: , Rfl:  .  diphenoxylate-atropine (LOMOTIL) 2.5-0.025 MG tablet, take 2 tablets by mouth four times a day, Disp: 240 tablet, Rfl: 2 .  EQL EVENING PRIMROSE OIL PO, Take 1,300 mg by mouth daily., Disp: , Rfl:  .  feeding supplement, ENSURE COMPLETE, (ENSURE COMPLETE) LIQD, Take 237 mLs by mouth 2 (two) times daily between meals. *Vanilla*, Disp: , Rfl:  .  lidocaine-prilocaine (EMLA) cream, Apply 1 application topically as needed. Apply to port site one hour before access, Disp: 30 g, Rfl: 0 .  Multiple Vitamin (MULTIVITAMIN WITH MINERALS) TABS tablet, Take 1 tablet by mouth daily., Disp: , Rfl:  .  Multiple Vitamins-Minerals (ANTIOXIDANT VITAMINS PO), Take 1 tablet by mouth daily., Disp: , Rfl:  .  NON FORMULARY, Take 1,230 mg by mouth daily. HSN-W, CAPSULES  HERBAL, Disp: , Rfl:  .  Omega 3 1000 MG CAPS, Take 1 capsule by mouth daily with breakfast. , Disp: , Rfl:  .  Probiotic Product (ALIGN PO), Take 1 capsule by mouth daily with breakfast. , Disp: , Rfl:  .  saccharomyces boulardii (FLORASTOR) 250 MG capsule, Take 1 capsule (250 mg total) by mouth 2 (two) times daily., Disp: 20 capsule, Rfl: 0  Allergies:  Allergies  Allergen Reactions  . Codeine Nausea And Vomiting  . Zofran [Ondansetron Hcl] Nausea And Vomiting  . Ciprofloxacin     Confusion/dizziness/fatigue  . Metronidazole     Confusion/dizziness/fatigue  . Skelaxin [Metaxalone]     Too strong  . Tetracycline Hcl     Not tolerated  . Avelox [Moxifloxacin] Rash    Past Medical History, Surgical history, Social history, and Family History were reviewed and updated.  Review of Systems: As above  Physical Exam:  vitals were not taken for this visit.  Wt Readings from Last 3 Encounters:  03/20/15 130 lb 6.4 oz (59.149  kg)  02/12/15 127 lb (57.607 kg)  02/04/15 130 lb 11.2 oz (59.285 kg)     Well-developed and well-nourished elderly female in no obvious distress. Head and neck exam shows no ocular or oral lesions. There is no palpable cervical or supraclavicular lymph nodes. Lungs are clear. Cardiac exam regular rate and rhythm with no murmurs, rubs or bruits. Abdomen is soft. She has good bowel sounds. There is no fluid wave. There is no palpable liver or spleen tip. There is no obvious abdominal mass. There is no guarding or rebound tenderness. Extremities shows no clubbing, cyanosis or edema. Neurological exam shows no focal neurological deficits. Skin exam shows no rashes, ecchymoses or petechia.  Lab Results  Component Value Date   WBC 4.7 02/12/2015   HGB 11.2* 02/12/2015   HCT 33.0* 02/12/2015   MCV 102* 02/12/2015   PLT 84* 02/12/2015     Chemistry      Component Value Date/Time   NA 142 02/12/2015 0937   NA 137 01/06/2015 0649   NA 130* 02/25/2013 0835   K 4.5 02/12/2015 0937   K 4.1 01/06/2015 0649   K 5.6* 02/25/2013 0835   CL 107 02/12/2015 0937   CL 106 01/06/2015 0649   CL 102 02/25/2013 0835   CO2 25 02/12/2015 0937   CO2 23 01/06/2015 0649   CO2 25 02/25/2013 0835   BUN 25* 02/12/2015 0937   BUN 21* 01/06/2015 0649   BUN 32* 02/25/2013 0835   CREATININE 1.0 02/12/2015 0937   CREATININE 0.90 01/06/2015 0649   CREATININE 1.07 02/25/2013 0835      Component Value Date/Time   CALCIUM 9.3 02/12/2015 0937   CALCIUM 9.8 01/06/2015 0649   CALCIUM 10.1 02/25/2013 0835   ALKPHOS 104* 02/12/2015 0937   ALKPHOS 133* 01/06/2015 0649   AST 36 02/12/2015 0937   AST 35 01/06/2015 0649   ALT 29 02/12/2015 0937   ALT 26 01/06/2015 0649   BILITOT 0.70 02/12/2015 0937   BILITOT 1.0 01/06/2015 0649         Impression and Plan: Tamara [name] is 82 year old white female with metastatic colon cancer. She's off therapy now. She really had a very difficult time with therapy. Her quality  of life was not improved.  For now, her quality of life is doing much better. I'm painful for this. Even though the cancer is slowly progressive, it is not causing her difficulties.  I don't think we have do any additional scans on her until May. A lot will depend on whether or not she has any new symptoms.  We will see what her labs look like.   I will plan to see her back in 6 weeks, she can come in sooner for IV fluids if necessary.   Volanda Napoleon, MD 2/24/20178:43 AM

## 2015-03-22 LAB — PREALBUMIN: PREALBUMIN: 19 mg/dL (ref 9–32)

## 2015-03-22 LAB — CEA: CEA: 165 ng/mL — ABNORMAL HIGH (ref 0.0–4.7)

## 2015-03-22 LAB — CEA (PARALLEL TESTING): CEA: 77.1 ng/mL — AB

## 2015-04-18 ENCOUNTER — Ambulatory Visit (HOSPITAL_BASED_OUTPATIENT_CLINIC_OR_DEPARTMENT_OTHER): Payer: Medicare Other | Admitting: Family

## 2015-04-18 ENCOUNTER — Other Ambulatory Visit (HOSPITAL_BASED_OUTPATIENT_CLINIC_OR_DEPARTMENT_OTHER): Payer: Medicare Other

## 2015-04-18 ENCOUNTER — Ambulatory Visit (HOSPITAL_BASED_OUTPATIENT_CLINIC_OR_DEPARTMENT_OTHER): Payer: Medicare Other

## 2015-04-18 VITALS — BP 153/66 | HR 86 | Temp 97.5°F | Resp 16 | Wt 129.1 lb

## 2015-04-18 DIAGNOSIS — C189 Malignant neoplasm of colon, unspecified: Secondary | ICD-10-CM

## 2015-04-18 DIAGNOSIS — C184 Malignant neoplasm of transverse colon: Secondary | ICD-10-CM | POA: Diagnosis not present

## 2015-04-18 DIAGNOSIS — C187 Malignant neoplasm of sigmoid colon: Secondary | ICD-10-CM

## 2015-04-18 LAB — CMP (CANCER CENTER ONLY)
ALBUMIN: 3.5 g/dL (ref 3.3–5.5)
ALK PHOS: 112 U/L — AB (ref 26–84)
ALT(SGPT): 28 U/L (ref 10–47)
AST: 36 U/L (ref 11–38)
BILIRUBIN TOTAL: 0.9 mg/dL (ref 0.20–1.60)
BUN, Bld: 21 mg/dL (ref 7–22)
CHLORIDE: 103 meq/L (ref 98–108)
CO2: 28 meq/L (ref 18–33)
Calcium: 9.6 mg/dL (ref 8.0–10.3)
Creat: 1 mg/dl (ref 0.6–1.2)
Glucose, Bld: 84 mg/dL (ref 73–118)
POTASSIUM: 4.2 meq/L (ref 3.3–4.7)
SODIUM: 136 meq/L (ref 128–145)
TOTAL PROTEIN: 6.8 g/dL (ref 6.4–8.1)

## 2015-04-18 LAB — CBC WITH DIFFERENTIAL (CANCER CENTER ONLY)
BASO#: 0 10*3/uL (ref 0.0–0.2)
BASO%: 0.6 % (ref 0.0–2.0)
EOS ABS: 0.1 10*3/uL (ref 0.0–0.5)
EOS%: 2.2 % (ref 0.0–7.0)
HCT: 32.5 % — ABNORMAL LOW (ref 34.8–46.6)
HGB: 11.3 g/dL — ABNORMAL LOW (ref 11.6–15.9)
LYMPH#: 0.9 10*3/uL (ref 0.9–3.3)
LYMPH%: 18.4 % (ref 14.0–48.0)
MCH: 35.6 pg — AB (ref 26.0–34.0)
MCHC: 34.8 g/dL (ref 32.0–36.0)
MCV: 103 fL — AB (ref 81–101)
MONO#: 0.7 10*3/uL (ref 0.1–0.9)
MONO%: 15.6 % — ABNORMAL HIGH (ref 0.0–13.0)
NEUT%: 63.2 % (ref 39.6–80.0)
NEUTROS ABS: 2.9 10*3/uL (ref 1.5–6.5)
Platelets: 80 10*3/uL — ABNORMAL LOW (ref 145–400)
RBC: 3.17 10*6/uL — AB (ref 3.70–5.32)
RDW: 12.8 % (ref 11.1–15.7)
WBC: 4.6 10*3/uL (ref 3.9–10.0)

## 2015-04-18 LAB — LACTATE DEHYDROGENASE: LDH: 194 U/L (ref 125–245)

## 2015-04-18 MED ORDER — SODIUM CHLORIDE 0.9% FLUSH
10.0000 mL | INTRAVENOUS | Status: DC | PRN
  Administered 2015-04-18: 10 mL via INTRAVENOUS
  Filled 2015-04-18: qty 10

## 2015-04-18 MED ORDER — SODIUM CHLORIDE 0.9 % IV SOLN
Freq: Once | INTRAVENOUS | Status: AC
  Administered 2015-04-18: 09:00:00 via INTRAVENOUS

## 2015-04-18 MED ORDER — HEPARIN SOD (PORK) LOCK FLUSH 100 UNIT/ML IV SOLN
500.0000 [IU] | Freq: Once | INTRAVENOUS | Status: AC
  Administered 2015-04-18: 500 [IU] via INTRAVENOUS
  Filled 2015-04-18: qty 5

## 2015-04-18 NOTE — Patient Instructions (Signed)

## 2015-04-18 NOTE — Progress Notes (Signed)
Hematology and Oncology Follow Up Visit  Tamara [name] [id number] Dec 06, 1929 82 y.o. 04/18/2015   Principle Diagnosis:  Recurrent/metastatic colon cancer - progressive   Current Therapy:   Status post palliative radiation therapy to the pelvis for rectal bleeding.    Interim History:  Tamara [name] is here today for follow-up. She continues to do well but feels that she would benefit from fluids. She denies fatigue and is staying active walking to her mailbox and back several times a day.  Her CEA in February was 165 and parallel testing level was 77.  No fever, chills, n/v, cough, rash, dizziness, SOB, chest pain, palpitations (has pacemaker), abdominal pain or changes in bladder habits. Her ileostomy is functioning appropriately and she uses lomotil as needed.  No swelling, tenderness, numbness or tingling in her extremities. No c/o joint aches or "bone" pain.  No lymphadenopathy found on exam.  She is still eating 5 small meals a day one of which includes Ensure in the morning and evening.   Medications:    Medication List       This list is accurate as of: 04/18/15  8:39 AM.  Always use your most recent med list.               ALIGN PO  Take 1 capsule by mouth daily with breakfast.     amLODipine 5 MG tablet  Commonly known as:  NORVASC  Take 1 tablet (5 mg total) by mouth daily.     ANTIOXIDANT VITAMINS PO  Take 1 tablet by mouth daily.     antiseptic oral rinse Liqd  15 mLs by Mouth Rinse route 5 (five) times daily. After brushing teeth     b complex vitamins tablet  Take 1 tablet by mouth every other day.     BIOTENE DRY MOUTH Pste  Place 1 application onto teeth 5 (five) times daily.     Biotin 5 MG Tabs  Take 1 tablet by mouth every morning.     CALCIUM MAGNESIUM PO  Take 1 tablet by mouth daily.     diphenoxylate-atropine 2.5-0.025 MG tablet  Commonly known as:  LOMOTIL  take 2 tablets by mouth four times a day     EQL EVENING PRIMROSE OIL PO    Take 1,300 mg by mouth daily.     feeding supplement (ENSURE COMPLETE) Liqd  Take 237 mLs by mouth 2 (two) times daily between meals. *Vanilla*     lidocaine-prilocaine cream  Commonly known as:  EMLA  Apply 1 application topically as needed. Apply to port site one hour before access     multivitamin with minerals Tabs tablet  Take 1 tablet by mouth daily.     NON FORMULARY  Take 1,230 mg by mouth daily. HSN-W, CAPSULES  HERBAL     Omega 3 1000 MG Caps  Take 1 capsule by mouth daily with breakfast.     saccharomyces boulardii 250 MG capsule  Commonly known as:  FLORASTOR  Take 1 capsule (250 mg total) by mouth 2 (two) times daily.     SUPER B COMPLEX PO  Take 1 tablet by mouth every other day. Alternating days with B complex     Vitamin D 2000 units tablet  Take 2,000 Units by mouth daily with breakfast.        Allergies:  Allergies  Allergen Reactions  . Codeine Nausea And Vomiting  . Zofran [Ondansetron Hcl] Nausea And Vomiting  . Ciprofloxacin     Confusion/dizziness/fatigue  .  Metronidazole     Confusion/dizziness/fatigue  . Skelaxin [Metaxalone]     Too strong  . Tetracycline Hcl     Not tolerated  . Avelox [Moxifloxacin] Rash    Past Medical History, Surgical history, Social history, and Family History were reviewed and updated.  Review of Systems: All other 10 point review of systems is negative.   Physical Exam:  vitals were not taken for this visit.  Wt Readings from Last 3 Encounters:  03/21/15 130 lb (58.968 kg)  03/20/15 130 lb 6.4 oz (59.149 kg)  02/12/15 127 lb (57.607 kg)    Ocular: Sclerae unicteric, pupils equal, round and reactive to light Ear-nose-throat: Oropharynx clear, dentition fair Lymphatic: No cervical supraclavicular or axillary adenopathy Lungs no rales or rhonchi, good excursion bilaterally Heart regular rate and rhythm, no murmur appreciated Abd soft, nontender, positive bowel sounds, no liver or spleen tip palpated on  exam, no fluid wave  MSK no focal spinal tenderness, no joint edema Neuro: non-focal, well-oriented, appropriate affect Breasts: Deferred  Lab Results  Component Value Date   WBC 4.0 03/21/2015   HGB 10.9* 03/21/2015   HCT 31.8* 03/21/2015   MCV 103* 03/21/2015   PLT 91* 03/21/2015   Lab Results  Component Value Date   FERRITIN 34 03/21/2015   IRON 117 03/21/2015   TIBC 348 03/21/2015   UIBC 230 03/21/2015   IRONPCTSAT 34 03/21/2015   Lab Results  Component Value Date   RBC 3.10* 03/21/2015   No results found for: KPAFRELGTCHN, LAMBDASER, KAPLAMBRATIO No results found for: IGGSERUM, IGA, IGMSERUM No results found for: Odetta Pink, SPEI   Chemistry      Component Value Date/Time   NA 138 03/21/2015 0837   NA 137 01/06/2015 0649   NA 130* 02/25/2013 0835   K 4.4 03/21/2015 0837   K 4.1 01/06/2015 0649   K 5.6* 02/25/2013 0835   CL 104 03/21/2015 0837   CL 106 01/06/2015 0649   CL 102 02/25/2013 0835   CO2 27 03/21/2015 0837   CO2 23 01/06/2015 0649   CO2 25 02/25/2013 0835   BUN 21 03/21/2015 0837   BUN 21* 01/06/2015 0649   BUN 32* 02/25/2013 0835   CREATININE 1.0 03/21/2015 0837   CREATININE 0.90 01/06/2015 0649   CREATININE 1.07 02/25/2013 0835      Component Value Date/Time   CALCIUM 9.8 03/21/2015 0837   CALCIUM 9.8 01/06/2015 0649   CALCIUM 10.1 02/25/2013 0835   ALKPHOS 112* 03/21/2015 0837   ALKPHOS 133* 01/06/2015 0649   AST 38 03/21/2015 0837   AST 35 01/06/2015 0649   ALT 26 03/21/2015 0837   ALT 26 01/06/2015 0649   BILITOT 0.80 03/21/2015 0837   BILITOT 1.0 01/06/2015 0649     Impression and Plan: Tamara [name] is 82 yo white female with metastatic colon cancer. She is now off of therapy and we are providing supportive care. She is doing quite well and has no complaints at this time. She does feel that she would benefit from some fluids today. We will go ahead and give her a liter while she  is here.  Her CBC and CMP today look good. We will continue to follow along with her and plan to see her back in 1 month.  We will plan to repeat scans in May.  She knows to contact us with any questions or concerns. We can see her in sooner if need be.   Eliezer Bottom,  NP 3/24/20178:39 AM

## 2015-04-19 LAB — CEA: CEA: 239 ng/mL — ABNORMAL HIGH (ref 0.0–4.7)

## 2015-04-19 LAB — CEA (PARALLEL TESTING): CEA: 124.3 ng/mL — AB

## 2015-04-19 LAB — PREALBUMIN: PREALBUMIN: 18 mg/dL (ref 9–32)

## 2015-04-28 ENCOUNTER — Ambulatory Visit (HOSPITAL_BASED_OUTPATIENT_CLINIC_OR_DEPARTMENT_OTHER): Payer: Medicare Other

## 2015-04-28 ENCOUNTER — Telehealth: Payer: Self-pay | Admitting: *Deleted

## 2015-04-28 ENCOUNTER — Other Ambulatory Visit: Payer: Self-pay | Admitting: Family

## 2015-04-28 VITALS — BP 162/66 | HR 83 | Temp 98.0°F | Resp 16

## 2015-04-28 DIAGNOSIS — K591 Functional diarrhea: Secondary | ICD-10-CM

## 2015-04-28 DIAGNOSIS — R531 Weakness: Secondary | ICD-10-CM

## 2015-04-28 DIAGNOSIS — C187 Malignant neoplasm of sigmoid colon: Secondary | ICD-10-CM

## 2015-04-28 DIAGNOSIS — C189 Malignant neoplasm of colon, unspecified: Secondary | ICD-10-CM

## 2015-04-28 DIAGNOSIS — R197 Diarrhea, unspecified: Secondary | ICD-10-CM

## 2015-04-28 MED ORDER — LOPERAMIDE HCL 2 MG PO TABS
2.0000 mg | ORAL_TABLET | Freq: Once | ORAL | Status: AC
  Administered 2015-04-28: 2 mg via ORAL
  Filled 2015-04-28: qty 1

## 2015-04-28 MED ORDER — LOPERAMIDE HCL 2 MG PO CAPS
ORAL_CAPSULE | ORAL | Status: AC
  Filled 2015-04-28: qty 1

## 2015-04-28 MED ORDER — DEXTROSE-NACL 5-0.45 % IV SOLN
INTRAVENOUS | Status: DC
  Administered 2015-04-28: 13:00:00 via INTRAVENOUS

## 2015-04-28 NOTE — Patient Instructions (Signed)

## 2015-04-28 NOTE — Telephone Encounter (Signed)
Patient c/o diarrhea over the weekend. She states she took Kaopectate and it's has slowed down, but she feels weak and light headed like she's dehydrated. She would like to come in for fluids.   Spoke to Dr Marin Olp and an appointment made for IVF this afternoon.

## 2015-05-14 DIAGNOSIS — C50919 Malignant neoplasm of unspecified site of unspecified female breast: Secondary | ICD-10-CM | POA: Diagnosis not present

## 2015-05-14 DIAGNOSIS — D500 Iron deficiency anemia secondary to blood loss (chronic): Secondary | ICD-10-CM | POA: Diagnosis not present

## 2015-05-14 DIAGNOSIS — Z0001 Encounter for general adult medical examination with abnormal findings: Secondary | ICD-10-CM | POA: Diagnosis not present

## 2015-05-14 DIAGNOSIS — C189 Malignant neoplasm of colon, unspecified: Secondary | ICD-10-CM | POA: Diagnosis not present

## 2015-05-14 DIAGNOSIS — I10 Essential (primary) hypertension: Secondary | ICD-10-CM | POA: Diagnosis not present

## 2015-05-14 DIAGNOSIS — Z1389 Encounter for screening for other disorder: Secondary | ICD-10-CM | POA: Diagnosis not present

## 2015-05-14 DIAGNOSIS — Z950 Presence of cardiac pacemaker: Secondary | ICD-10-CM | POA: Diagnosis not present

## 2015-05-15 ENCOUNTER — Ambulatory Visit (INDEPENDENT_AMBULATORY_CARE_PROVIDER_SITE_OTHER): Payer: Medicare Other | Admitting: *Deleted

## 2015-05-15 DIAGNOSIS — Z950 Presence of cardiac pacemaker: Secondary | ICD-10-CM

## 2015-05-15 DIAGNOSIS — I455 Other specified heart block: Secondary | ICD-10-CM

## 2015-05-15 NOTE — Progress Notes (Signed)
Pacemaker check in clinic. Normal device function. Thresholds, sensing, impedances consistent with previous measurements. Device programmed to maximize longevity. 182 mode switches (<0.1%), all less than 30 sec., no EGMs. 10 VHR episodes, all appear to be atrial tach. Device programmed at appropriate safety margins. Histogram distribution appropriate for patient activity level. Device programmed to optimize intrinsic conduction. Estimated longevity 11.5-15.43yrs. ROV with MC in 25mo.

## 2015-05-21 ENCOUNTER — Other Ambulatory Visit (HOSPITAL_BASED_OUTPATIENT_CLINIC_OR_DEPARTMENT_OTHER): Payer: Medicare Other

## 2015-05-21 ENCOUNTER — Ambulatory Visit (HOSPITAL_BASED_OUTPATIENT_CLINIC_OR_DEPARTMENT_OTHER): Payer: Medicare Other | Admitting: Hematology & Oncology

## 2015-05-21 ENCOUNTER — Ambulatory Visit (HOSPITAL_BASED_OUTPATIENT_CLINIC_OR_DEPARTMENT_OTHER): Payer: Medicare Other

## 2015-05-21 ENCOUNTER — Encounter: Payer: Self-pay | Admitting: Hematology & Oncology

## 2015-05-21 VITALS — BP 169/75 | HR 87 | Temp 97.6°F | Resp 16 | Ht 66.0 in | Wt 131.0 lb

## 2015-05-21 DIAGNOSIS — Z933 Colostomy status: Secondary | ICD-10-CM | POA: Diagnosis not present

## 2015-05-21 DIAGNOSIS — C187 Malignant neoplasm of sigmoid colon: Secondary | ICD-10-CM | POA: Diagnosis not present

## 2015-05-21 DIAGNOSIS — C189 Malignant neoplasm of colon, unspecified: Secondary | ICD-10-CM | POA: Diagnosis not present

## 2015-05-21 LAB — CBC WITH DIFFERENTIAL (CANCER CENTER ONLY)
BASO#: 0 10*3/uL (ref 0.0–0.2)
BASO%: 0.4 % (ref 0.0–2.0)
EOS%: 2 % (ref 0.0–7.0)
Eosinophils Absolute: 0.1 10*3/uL (ref 0.0–0.5)
HCT: 32.5 % — ABNORMAL LOW (ref 34.8–46.6)
HGB: 11.5 g/dL — ABNORMAL LOW (ref 11.6–15.9)
LYMPH#: 0.8 10*3/uL — ABNORMAL LOW (ref 0.9–3.3)
LYMPH%: 16 % (ref 14.0–48.0)
MCH: 36.1 pg — AB (ref 26.0–34.0)
MCHC: 35.4 g/dL (ref 32.0–36.0)
MCV: 102 fL — AB (ref 81–101)
MONO#: 0.7 10*3/uL (ref 0.1–0.9)
MONO%: 13.6 % — ABNORMAL HIGH (ref 0.0–13.0)
NEUT#: 3.4 10*3/uL (ref 1.5–6.5)
NEUT%: 68 % (ref 39.6–80.0)
PLATELETS: 49 10*3/uL — AB (ref 145–400)
RBC: 3.19 10*6/uL — ABNORMAL LOW (ref 3.70–5.32)
RDW: 11.9 % (ref 11.1–15.7)
WBC: 4.9 10*3/uL (ref 3.9–10.0)

## 2015-05-21 LAB — CMP (CANCER CENTER ONLY)
ALT: 29 U/L (ref 10–47)
AST: 44 U/L — ABNORMAL HIGH (ref 11–38)
Albumin: 3.7 g/dL (ref 3.3–5.5)
Alkaline Phosphatase: 125 U/L — ABNORMAL HIGH (ref 26–84)
BUN: 19 mg/dL (ref 7–22)
CALCIUM: 9.5 mg/dL (ref 8.0–10.3)
CHLORIDE: 101 meq/L (ref 98–108)
CO2: 27 meq/L (ref 18–33)
Creat: 1 mg/dl (ref 0.6–1.2)
GLUCOSE: 88 mg/dL (ref 73–118)
POTASSIUM: 4.5 meq/L (ref 3.3–4.7)
Sodium: 138 mEq/L (ref 128–145)
Total Bilirubin: 1 mg/dl (ref 0.20–1.60)
Total Protein: 7.2 g/dL (ref 6.4–8.1)

## 2015-05-21 LAB — LACTATE DEHYDROGENASE: LDH: 200 U/L (ref 125–245)

## 2015-05-21 MED ORDER — SODIUM CHLORIDE 0.9% FLUSH
10.0000 mL | INTRAVENOUS | Status: DC | PRN
  Administered 2015-05-21: 10 mL via INTRAVENOUS
  Filled 2015-05-21: qty 10

## 2015-05-21 MED ORDER — SODIUM CHLORIDE 0.9 % IV SOLN
Freq: Once | INTRAVENOUS | Status: AC
  Administered 2015-05-21: 10:00:00 via INTRAVENOUS

## 2015-05-21 MED ORDER — HEPARIN SOD (PORK) LOCK FLUSH 100 UNIT/ML IV SOLN
500.0000 [IU] | Freq: Once | INTRAVENOUS | Status: AC
  Administered 2015-05-21: 500 [IU] via INTRAVENOUS
  Filled 2015-05-21: qty 5

## 2015-05-21 NOTE — Progress Notes (Signed)
Hematology and Oncology Follow Up Visit  Tamara [name] [id number] Sep 16, 1929 81 y.o. 05/21/2015   Principle Diagnosis:   Recurrent/metastatic colon cancer - Progressive  Current Therapy:     Status post palliative radiation therapy to the pelvis for rectal bleeding.    Interim History:  Tamara [name] is back for follow-up. She looks quite good. She really has no specific complaints. She and her husband celebrated their 35th wedding anniversary. They really had a nice time. He was a quiet anniversary given the bad weather we have been having.  She is still eating well. Her colostomy is functioning nicely. She is not having pain issues not having nausea or vomiting. She is not bleeding. She had radiation therapy because of rectal bleeding. This was quite effective.  Her CEA continues to go up. However, she is totally asymptomatic. Her last CEA was 124.  She's had no fever. She's had no cough.  Currently, her performance status is ECOG 2   Medications:  Current outpatient prescriptions:  .  amLODipine (NORVASC) 5 MG tablet, Take 1 tablet (5 mg total) by mouth daily., Disp: 30 tablet, Rfl: 0 .  antiseptic oral rinse (BIOTENE) LIQD, 15 mLs by Mouth Rinse route 5 (five) times daily. After brushing teeth, Disp: , Rfl:  .  b complex vitamins tablet, Take 1 tablet by mouth every other day., Disp: , Rfl:  .  B Complex-C (SUPER B COMPLEX PO), Take 1 tablet by mouth every other day. Alternating days with B complex, Disp: , Rfl:  .  Biotin 5 MG TABS, Take 1 tablet by mouth every morning. , Disp: , Rfl:  .  Calcium-Magnesium-Vitamin D (CALCIUM MAGNESIUM PO), Take 1 tablet by mouth daily., Disp: , Rfl:  .  Cholecalciferol (VITAMIN D) 2000 UNITS tablet, Take 2,000 Units by mouth daily with breakfast., Disp: , Rfl:  .  Dentifrices (BIOTENE DRY MOUTH) PSTE, Place 1 application onto teeth 5 (five) times daily., Disp: , Rfl:  .  diphenoxylate-atropine (LOMOTIL) 2.5-0.025 MG tablet, take 2 tablets by  mouth four times a day, Disp: 240 tablet, Rfl: 2 .  EQL EVENING PRIMROSE OIL PO, Take 1,300 mg by mouth daily., Disp: , Rfl:  .  feeding supplement, ENSURE COMPLETE, (ENSURE COMPLETE) LIQD, Take 237 mLs by mouth 2 (two) times daily between meals. *Vanilla*, Disp: , Rfl:  .  lidocaine-prilocaine (EMLA) cream, Apply 1 application topically as needed. Apply to port site one hour before access, Disp: 30 g, Rfl: 0 .  Multiple Vitamin (MULTIVITAMIN WITH MINERALS) TABS tablet, Take 1 tablet by mouth daily., Disp: , Rfl:  .  Multiple Vitamins-Minerals (ANTIOXIDANT VITAMINS PO), Take 1 tablet by mouth daily., Disp: , Rfl:  .  NON FORMULARY, Take 1,230 mg by mouth daily. HSN-W, CAPSULES  HERBAL, Disp: , Rfl:  .  Omega 3 1000 MG CAPS, Take 1 capsule by mouth daily with breakfast. , Disp: , Rfl:  .  Probiotic Product (ALIGN PO), Take 1 capsule by mouth daily with breakfast. , Disp: , Rfl:  .  saccharomyces boulardii (FLORASTOR) 250 MG capsule, Take 1 capsule (250 mg total) by mouth 2 (two) times daily., Disp: 20 capsule, Rfl: 0  Allergies:  Allergies  Allergen Reactions  . Codeine Nausea And Vomiting  . Zofran [Ondansetron Hcl] Nausea And Vomiting  . Ciprofloxacin     Confusion/dizziness/fatigue  . Metronidazole     Confusion/dizziness/fatigue  . Skelaxin [Metaxalone]     Too strong  . Tetracycline Hcl     Not tolerated  .  Avelox [Moxifloxacin] Rash    Past Medical History, Surgical history, Social history, and Family History were reviewed and updated.  Review of Systems: As above  Physical Exam:  height is 5\' 6"  (1.676 m) and weight is 131 lb (59.421 kg). Her oral temperature is 97.6 F (36.4 C). Her blood pressure is 169/75 and her pulse is 87. Her respiration is 16.   Wt Readings from Last 3 Encounters:  05/21/15 131 lb (59.421 kg)  04/18/15 129 lb 1.9 oz (58.568 kg)  03/21/15 130 lb (58.968 kg)     Well-developed and well-nourished elderly female in no obvious distress. Head and  neck exam shows no ocular or oral lesions. There is no palpable cervical or supraclavicular lymph nodes. Lungs are clear. Cardiac exam regular rate and rhythm with no murmurs, rubs or bruits. Abdomen is soft. She has good bowel sounds. There is no fluid wave. There is no palpable liver or spleen tip. There is no obvious abdominal mass. There is no guarding or rebound tenderness. Extremities shows no clubbing, cyanosis or edema. Neurological exam shows no focal neurological deficits. Skin exam shows no rashes, ecchymoses or petechia.  Lab Results  Component Value Date   WBC 4.6 04/18/2015   HGB 11.3* 04/18/2015   HCT 32.5* 04/18/2015   MCV 103* 04/18/2015   PLT 80* 04/18/2015     Chemistry      Component Value Date/Time   NA 136 04/18/2015 0836   NA 137 01/06/2015 0649   NA 130* 02/25/2013 0835   K 4.2 04/18/2015 0836   K 4.1 01/06/2015 0649   K 5.6* 02/25/2013 0835   CL 103 04/18/2015 0836   CL 106 01/06/2015 0649   CL 102 02/25/2013 0835   CO2 28 04/18/2015 0836   CO2 23 01/06/2015 0649   CO2 25 02/25/2013 0835   BUN 21 04/18/2015 0836   BUN 21* 01/06/2015 0649   BUN 32* 02/25/2013 0835   CREATININE 1.0 04/18/2015 0836   CREATININE 0.90 01/06/2015 0649   CREATININE 1.07 02/25/2013 0835      Component Value Date/Time   CALCIUM 9.6 04/18/2015 0836   CALCIUM 9.8 01/06/2015 0649   CALCIUM 10.1 02/25/2013 0835   ALKPHOS 112* 04/18/2015 0836   ALKPHOS 133* 01/06/2015 0649   AST 36 04/18/2015 0836   AST 35 01/06/2015 0649   ALT 28 04/18/2015 0836   ALT 26 01/06/2015 0649   BILITOT 0.90 04/18/2015 0836   BILITOT 1.0 01/06/2015 0649         Impression and Plan: Tamara [name] is 83 year old white female with metastatic colon cancer. She's off therapy now. She really had a very difficult time with therapy. Her quality of life was not improved.  For now, her quality of life is doing much better. I  It is obvious that her cancer is progressing. Her CEA is going up. However, I  think that he caught she is asymptomatic, we really do not have to put her through scans. I think that as long as her colostomy is functioning, then we can just hold on any invasive or radiographic studies.  She will have some IV fluids today. She calls [place] when she needs IV fluids.  I'm thankful that she and her husband had their 72th wedding anniversary. I would rather they could enjoy this together.  We will plan to get her back in one more month.   Volanda Napoleon, MD 4/26/20179:30 AM

## 2015-05-21 NOTE — Patient Instructions (Signed)

## 2015-05-22 LAB — CEA: CEA: 393.5 ng/mL — ABNORMAL HIGH (ref 0.0–4.7)

## 2015-05-22 LAB — PREALBUMIN: PREALBUMIN: 20 mg/dL (ref 9–32)

## 2015-05-28 ENCOUNTER — Other Ambulatory Visit: Payer: Self-pay | Admitting: *Deleted

## 2015-05-28 ENCOUNTER — Telehealth: Payer: Self-pay | Admitting: *Deleted

## 2015-05-28 ENCOUNTER — Other Ambulatory Visit (HOSPITAL_BASED_OUTPATIENT_CLINIC_OR_DEPARTMENT_OTHER): Payer: Medicare Other

## 2015-05-28 ENCOUNTER — Ambulatory Visit (HOSPITAL_BASED_OUTPATIENT_CLINIC_OR_DEPARTMENT_OTHER): Payer: Medicare Other

## 2015-05-28 VITALS — BP 170/70 | HR 72 | Temp 97.5°F

## 2015-05-28 DIAGNOSIS — C189 Malignant neoplasm of colon, unspecified: Secondary | ICD-10-CM | POA: Diagnosis present

## 2015-05-28 DIAGNOSIS — R110 Nausea: Secondary | ICD-10-CM | POA: Diagnosis present

## 2015-05-28 LAB — CMP (CANCER CENTER ONLY)
ALBUMIN: 3.8 g/dL (ref 3.3–5.5)
ALT(SGPT): 29 U/L (ref 10–47)
AST: 39 U/L — AB (ref 11–38)
Alkaline Phosphatase: 123 U/L — ABNORMAL HIGH (ref 26–84)
BILIRUBIN TOTAL: 0.9 mg/dL (ref 0.20–1.60)
BUN, Bld: 19 mg/dL (ref 7–22)
CO2: 27 meq/L (ref 18–33)
CREATININE: 0.7 mg/dL (ref 0.6–1.2)
Calcium: 9.7 mg/dL (ref 8.0–10.3)
Chloride: 101 mEq/L (ref 98–108)
Glucose, Bld: 121 mg/dL — ABNORMAL HIGH (ref 73–118)
Potassium: 4.3 mEq/L (ref 3.3–4.7)
SODIUM: 138 meq/L (ref 128–145)
TOTAL PROTEIN: 7.3 g/dL (ref 6.4–8.1)

## 2015-05-28 LAB — CBC WITH DIFFERENTIAL (CANCER CENTER ONLY)
BASO#: 0 10*3/uL (ref 0.0–0.2)
BASO%: 0.4 % (ref 0.0–2.0)
EOS%: 1.2 % (ref 0.0–7.0)
Eosinophils Absolute: 0.1 10*3/uL (ref 0.0–0.5)
HEMATOCRIT: 32.3 % — AB (ref 34.8–46.6)
HEMOGLOBIN: 11.5 g/dL — AB (ref 11.6–15.9)
LYMPH#: 1.1 10*3/uL (ref 0.9–3.3)
LYMPH%: 20.7 % (ref 14.0–48.0)
MCH: 36.3 pg — ABNORMAL HIGH (ref 26.0–34.0)
MCHC: 35.6 g/dL (ref 32.0–36.0)
MCV: 102 fL — AB (ref 81–101)
MONO#: 0.5 10*3/uL (ref 0.1–0.9)
MONO%: 10.5 % (ref 0.0–13.0)
NEUT%: 67.2 % (ref 39.6–80.0)
NEUTROS ABS: 3.5 10*3/uL (ref 1.5–6.5)
PLATELETS: 53 10*3/uL — AB (ref 145–400)
RBC: 3.17 10*6/uL — AB (ref 3.70–5.32)
RDW: 12.2 % (ref 11.1–15.7)
WBC: 5.2 10*3/uL (ref 3.9–10.0)

## 2015-05-28 MED ORDER — HEPARIN SOD (PORK) LOCK FLUSH 100 UNIT/ML IV SOLN
500.0000 [IU] | Freq: Once | INTRAVENOUS | Status: AC
  Administered 2015-05-28: 500 [IU] via INTRAVENOUS
  Filled 2015-05-28: qty 5

## 2015-05-28 MED ORDER — SODIUM CHLORIDE 0.9 % IV SOLN
Freq: Once | INTRAVENOUS | Status: AC
  Administered 2015-05-28: 14:00:00 via INTRAVENOUS

## 2015-05-28 MED ORDER — SODIUM CHLORIDE 0.9% FLUSH
10.0000 mL | INTRAVENOUS | Status: DC | PRN
  Administered 2015-05-28: 10 mL via INTRAVENOUS
  Filled 2015-05-28: qty 10

## 2015-05-28 NOTE — Telephone Encounter (Signed)
Patient has been having diarrhea. This started last pm. She wants to come into the office for IVF.   Spoke with Dr Marin Olp. He will order IVF for patient to come today. He also wants labs when patient is here.   Patient is aware of appointment time.

## 2015-05-28 NOTE — Patient Instructions (Signed)
Chronic Diarrhea Diarrhea is frequent loose and watery bowel movements. It can cause you to feel weak and dehydrated. Dehydration can cause you to become tired and thirsty and to have a dry mouth, decreased urination, and dark yellow urine. Diarrhea is a sign of another problem, most often an infection that will not last long. In most cases, diarrhea lasts 2-3 days. Diarrhea that lasts longer than 4 weeks is called long-lasting (chronic) diarrhea. It is important to treat your diarrhea as directed by your health care provider to lessen or prevent future episodes of diarrhea.  CAUSES  There are many causes of chronic diarrhea. The following are some possible causes:   Gastrointestinal infections caused by viruses, bacteria, or parasites.   Food poisoning or food allergies.   Certain medicines, such as antibiotics, chemotherapy, and laxatives.   Artificial sweeteners and fructose.   Digestive disorders, such as celiac disease and inflammatory bowel diseases.   Irritable bowel syndrome.  Some disorders of the pancreas.  Disorders of the thyroid.  Reduced blood flow to the intestines.  Cancer. Sometimes the cause of chronic diarrhea is unknown. RISK FACTORS  Having a severely weakened immune system, such as from HIV or AIDS.   Taking certain types of cancer-fighting drugs (such as with chemotherapy) or other medicines.   Having had a recent organ transplant.   Having a portion of the stomach or small bowel removed.   Traveling to countries where food and water supplies are often contaminated.  SYMPTOMS  In addition to frequent, loose stools, diarrhea may cause:   Cramping.   Abdominal pain.   Nausea.   Fever.  Fatigue.  Urgent need to use the bathroom.  Loss of bowel control. DIAGNOSIS  Your health care provider must take a careful history and perform a physical exam. Tests given are based on your symptoms and history. Tests may include:   Blood or  stool tests. Three or more stool samples may be examined. Stool cultures may be used to test for bacteria or parasites.   X-rays.   A procedure in which a thin tube is inserted into the mouth or rectum (endoscopy). This allows the health care provider to look inside the intestine.  TREATMENT   Treatment is aimed at correcting the cause of the diarrhea when possible.  Diarrhea caused by an infection can often be treated with antibiotic medicines.  Diarrhea not caused by an infection may require you to take long-term medicine or have surgery. Specific treatment should be discussed with your health care provider.  If the cause cannot be determined, treatment aims to relieve symptoms and prevent dehydration. Serious health problems can occur if you do not maintain proper fluid levels. Treatment may include:  Taking an oral rehydration solution (ORS).  Not drinking beverages that contain caffeine (such as tea, coffee, and soft drinks).  Not drinking alcohol.  Maintaining well-balanced nutrition to help you recover faster. HOME CARE INSTRUCTIONS   Drink enough fluids to keep urine clear or pale yellow. Drink 1 cup (8 oz) of fluid for each diarrhea episode. Avoid fluids that contain simple sugars, fruit juices, whole milk products, and sodas. Hydrate with an ORS. You may purchase the ORS or prepare it at home by mixing the following ingredients together:   - tsp (1.7-3  mL) table salt.   tsp (3  mL) baking soda.   tsp (1.7 mL) salt substitute containing potassium chloride.  1 tbsp (20 mL) sugar.  4.2 c (1 L) of water.     Certain foods and beverages may increase the speed at which food moves through the gastrointestinal (GI) tract. These foods and beverages should be avoided. They include:  Caffeinated and alcoholic beverages.  High-fiber foods, such as raw fruits and vegetables, nuts, seeds, and whole grain breads and cereals.  Foods and beverages sweetened with sugar  alcohols, such as xylitol, sorbitol, and mannitol.   Some foods may be well tolerated and may help thicken stool. These include:  Starchy foods, such as rice, toast, pasta, low-sugar cereal, oatmeal, grits, baked potatoes, crackers, and bagels.  Bananas.  Applesauce.  Add probiotic-rich foods to help increase healthy bacteria in the GI tract. These include yogurt and fermented milk products.  Wash your hands well after each diarrhea episode.  Only take over-the-counter or prescription medicines as directed by your health care provider.  Take a warm bath to relieve any burning or pain from frequent diarrhea episodes. SEEK MEDICAL CARE IF:   You are not urinating as often.  Your urine is a dark color.  You become very tired or dizzy.  You have severe pain in the abdomen or rectum.  Your have blood or pus in your stools.  Your stools look black and tarry. SEEK IMMEDIATE MEDICAL CARE IF:   You are unable to keep fluids down.  You have persistent vomiting.  You have blood in your stool.  Your stools are black and tarry.  You do not urinate in 6-8 hours, or there is only a small amount of very dark urine.  You have abdominal pain that increases or localizes.  You have weakness, dizziness, confusion, or lightheadedness.  You have a severe headache.  Your diarrhea gets worse or does not get better.  You have a fever or persistent symptoms for more than 2-3 days.  You have a fever and your symptoms suddenly get worse. MAKE SURE YOU:   Understand these instructions.  Will watch your condition.  Will get help right away if you are not doing well or get worse.   This information is not intended to replace advice given to you by your health care provider. Make sure you discuss any questions you have with your health care provider.   Document Released: 04/03/2003 Document Revised: 01/16/2013 Document Reviewed: 07/06/2012 Elsevier Interactive Patient Education 2016  [employer]  

## 2015-05-29 ENCOUNTER — Encounter (HOSPITAL_COMMUNITY): Payer: Self-pay | Admitting: Emergency Medicine

## 2015-05-29 ENCOUNTER — Emergency Department (HOSPITAL_COMMUNITY)
Admission: EM | Admit: 2015-05-29 | Discharge: 2015-05-30 | Disposition: A | Discharge status: Home / Self Care | Payer: Medicare Other | Attending: Emergency Medicine | Admitting: Emergency Medicine

## 2015-05-29 ENCOUNTER — Emergency Department (HOSPITAL_COMMUNITY): Payer: Medicare Other

## 2015-05-29 ENCOUNTER — Ambulatory Visit (INDEPENDENT_AMBULATORY_CARE_PROVIDER_SITE_OTHER): Payer: Medicare Other | Admitting: Podiatry

## 2015-05-29 DIAGNOSIS — F419 Anxiety disorder, unspecified: Secondary | ICD-10-CM | POA: Diagnosis not present

## 2015-05-29 DIAGNOSIS — C189 Malignant neoplasm of colon, unspecified: Secondary | ICD-10-CM | POA: Insufficient documentation

## 2015-05-29 DIAGNOSIS — Z79899 Other long term (current) drug therapy: Secondary | ICD-10-CM | POA: Insufficient documentation

## 2015-05-29 DIAGNOSIS — Z950 Presence of cardiac pacemaker: Secondary | ICD-10-CM | POA: Diagnosis not present

## 2015-05-29 DIAGNOSIS — R112 Nausea with vomiting, unspecified: Secondary | ICD-10-CM | POA: Diagnosis present

## 2015-05-29 DIAGNOSIS — F29 Unspecified psychosis not due to a substance or known physiological condition: Secondary | ICD-10-CM | POA: Diagnosis not present

## 2015-05-29 DIAGNOSIS — L600 Ingrowing nail: Secondary | ICD-10-CM

## 2015-05-29 DIAGNOSIS — B351 Tinea unguium: Secondary | ICD-10-CM

## 2015-05-29 DIAGNOSIS — C50919 Malignant neoplasm of unspecified site of unspecified female breast: Secondary | ICD-10-CM | POA: Insufficient documentation

## 2015-05-29 DIAGNOSIS — R110 Nausea: Secondary | ICD-10-CM

## 2015-05-29 DIAGNOSIS — I10 Essential (primary) hypertension: Secondary | ICD-10-CM | POA: Diagnosis not present

## 2015-05-29 MED ORDER — METOCLOPRAMIDE HCL 5 MG/ML IJ SOLN
10.0000 mg | Freq: Once | INTRAMUSCULAR | Status: AC
  Administered 2015-05-30: 10 mg via INTRAVENOUS
  Filled 2015-05-29: qty 2

## 2015-05-29 NOTE — ED Notes (Signed)
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

## 2015-05-29 NOTE — Progress Notes (Signed)
   Subjective:    Patient ID: Tamara [name], female    DOB: 01/28/1929, 82 y.o.   MRN: [id number]  HPI    Review of Systems  All other systems reviewed and are negative.      Objective:   Physical Exam        Assessment & Plan:

## 2015-05-29 NOTE — ED Provider Notes (Signed)
History  By signing my name below, I, Tamara [name], attest that this documentation has been prepared under the direction and in the presence of Tamara Iles, MD. Electronically Signed: Marlowe [name], ED Scribe. 05/30/2015. 3:35 AM  Chief Complaint  Patient presents with  . Nausea   The history is provided by the patient and medical records. No language interpreter was used.    HPI Comments:  Tamara [name] is a 82 y.o. female brought in by EMS, who presents to the Emergency Department complaining of nausea that began about 6 hours ago. Pt states she ate too much at lunch, normally eating 5 small meals daily. She states she began feeling light-headed this afternoon, checked her BP and reports systolic greater than 488. She reports taking 2 Tylenol then and then her usual Lomotil dose about four hours ago. She was started on HTN medication previously but had nausea with it and it was discontinued. She reports one episode of emesis on the way here due to rough ambulance ride. She denies modifying factors of the symptoms. She denies any fever, chills, cold symptoms, CP, SOB, dysuria, abdominal pain. She has a colostomy and states she has recently put out more than normal and was rehydrated by her doctor, Dr. Marin [name], as he normally does when she puts out too much.She denies being on chemotherapy or radiation. She reports h/o cancer.  Past Medical History  Diagnosis Date  . HTN (hypertension) August 2014  . Syncope and collapse August 2014    sinus pauses/ PTVDP  . Colitis   . Sinus arrest 08/28/2012  . Breast cancer ([place])   . Colon cancer ([place]) 02/21/2013  . Radiation 01/15/15-02/05/15    pelvis/Hartmann's pouch area   Past Surgical History  Procedure Laterality Date  . Breast lumpectomy    . Cholecystectomy    . Tonsillectomy    . Pacemaker insertion  08/28/12    MDT  . Flexible sigmoidoscopy N/A 12/06/2012    Procedure: FLEXIBLE SIGMOIDOSCOPY;  Surgeon: Lear Ng, MD;  Location: [place];  Service: Endoscopy;  Laterality: N/A;  . Laparoscopic partial colectomy N/A 12/07/2012    Procedure: LAPAROSCOPIC-ASSISTED TRANSVERSE COLECTOMY;  Surgeon: Joyice Faster. Cornett, MD;  Location: [place];  Service: General;  Laterality: N/A;  . Colonoscopy N/A 12/15/2012    Procedure: COLONOSCOPY;  Surgeon: Jeryl Columbia, MD;  Location: [facility] ENDOSCOPY;  Service: Endoscopy;  Laterality: N/A;  . Bowel decompression N/A 12/15/2012    Procedure: BOWEL DECOMPRESSION;  Surgeon: Jeryl Columbia, MD;  Location: [facility] ENDOSCOPY;  Service: Endoscopy;  Laterality: N/A;  . Colon resection N/A 12/22/2012    Procedure: EXPLORATORY LAPAROTOMY , RIGHT Colectomy IILEOSTOMY;  Surgeon: Rolm Bookbinder, MD;  Location: [place];  Service: General;  Laterality: N/A;  . Left heart catheterization with coronary angiogram Bilateral 08/26/2012    Procedure: LEFT HEART CATHETERIZATION WITH CORONARY ANGIOGRAM;  Surgeon: Lorretta Harp, MD;  Location: [facility] CATH LAB;  Service: Cardiovascular;  Laterality: Bilateral;  . Permanent pacemaker insertion N/A 08/28/2012    Procedure: PERMANENT PACEMAKER INSERTION;  Surgeon: Sanda Klein, MD;  Location: [place] CATH LAB;  Service: Cardiovascular;  Laterality: N/A;  . Flexible sigmoidoscopy N/A 01/06/2015    Procedure: FLEXIBLE SIGMOIDOSCOPY;  Surgeon: Wilford Corner, MD;  Location: [facility] ENDOSCOPY;  Service: Endoscopy;  Laterality: N/A;   Family History  Problem Relation Age of Onset  . Alzheimer's disease Mother   . Renal Disease Father   . Coronary artery disease Brother    Social History  Substance Use Topics  . Smoking status: Never Smoker   . Smokeless tobacco: Never Used     Comment: never used tobacco  . Alcohol Use: No   OB History    No data available     Review of Systems A complete 10 system review of systems was obtained and all systems are negative except as noted in the HPI and PMH.   Allergies  Codeine; Zofran; Ciprofloxacin;  Metronidazole; Skelaxin; Tetracycline hcl; and Avelox  Home Medications   Prior to Admission medications   Medication Sig Start Date End Date Taking? Authorizing Provider  antiseptic oral rinse (BIOTENE) LIQD 15 mLs by Mouth Rinse route 5 (five) times daily. After brushing teeth   Yes Historical Provider, MD  b complex vitamins tablet Take 1 tablet by mouth every other day.   Yes Historical Provider, MD  B Complex-C (SUPER B COMPLEX PO) Take 1 tablet by mouth every other day. Alternating days with B complex   Yes Historical Provider, MD  Biotin 5 MG TABS Take 1 tablet by mouth every morning.    Yes Historical Provider, MD  Calcium-Magnesium-Vitamin D (CALCIUM MAGNESIUM PO) Take 1 tablet by mouth daily.   Yes Historical Provider, MD  Cholecalciferol (VITAMIN D) 2000 UNITS tablet Take 2,000 Units by mouth daily with breakfast.   Yes Historical Provider, MD  Dentifrices (BIOTENE DRY MOUTH) PSTE Place 1 application onto teeth 5 (five) times daily.   Yes Historical Provider, MD  diphenoxylate-atropine (LOMOTIL) 2.5-0.025 MG tablet take 2 tablets by mouth four times a day Patient taking differently: take 2 tablets by mouth four times a day as needed for diarrhea or loose stools 03/12/15  Yes [place], NP  EQL EVENING PRIMROSE OIL PO Take 1,300 mg by mouth daily.   Yes Historical Provider, MD  feeding supplement, ENSURE COMPLETE, (ENSURE COMPLETE) LIQD Take 237 mLs by mouth 2 (two) times daily between meals. [name]* 01/01/13  Yes Josetta Huddle, MD  lidocaine-prilocaine (EMLA) cream Apply 1 application topically as needed. Apply to port site one hour before access 05/13/14  Yes Volanda Napoleon, MD  Multiple Vitamin (MULTIVITAMIN WITH MINERALS) TABS tablet Take 1 tablet by mouth daily.   Yes Historical Provider, MD  Multiple Vitamins-Minerals (ANTIOXIDANT VITAMINS PO) Take 1 tablet by mouth daily.   Yes Historical Provider, MD  NON FORMULARY Take 1,230 mg by mouth daily. HSN-W, CAPSULES  HERBAL    Yes Historical Provider, MD  Omega 3 1000 MG CAPS Take 1 capsule by mouth daily with breakfast.    Yes Historical Provider, MD  Probiotic Product (ALIGN PO) Take 1 capsule by mouth daily with breakfast.    Yes Historical Provider, MD  saccharomyces boulardii (FLORASTOR) 250 MG capsule Take 1 capsule (250 mg total) by mouth 2 (two) times daily. 04/18/14  Yes Costin Karlyne Greenspan, MD  amLODipine (NORVASC) 5 MG tablet Take 1 tablet (5 mg total) by mouth daily. Patient not taking: Reported on 05/29/2015 01/08/15   Geradine Girt, DO   Triage Vitals: BP 167/123 mmHg  Pulse 76  Temp(Src) 97.5 F (36.4 C) (Oral)  Resp 16  SpO2 100% Physical Exam  Constitutional: She is oriented to person, place, and time. She appears well-developed and well-nourished. No distress.  HENT:  Head: Normocephalic and atraumatic.  Moist mucous membranes  Eyes: Conjunctivae are normal.  Neck: Neck supple.  Cardiovascular: Normal rate, regular rhythm and normal heart sounds.   No murmur heard. Pulmonary/Chest: Effort normal and breath sounds normal.  [place]  in left upper chest. Port in right upper chest with no erythema or tenderness.  Abdominal: Soft. Bowel sounds are normal. She exhibits no distension. There is no tenderness.  Colostomy in RLQ draining normal appearing stool.  Musculoskeletal: She exhibits no edema.  Neurological: She is alert and oriented to person, place, and time.  Fluent speech  Skin: Skin is warm and dry. No rash noted. No erythema.  Psychiatric:  Became anxious during interview and began rapid breathing and tearfulness.  Nursing note and vitals reviewed.   ED Course  Procedures (including critical care time) DIAGNOSTIC STUDIES: Oxygen Saturation is 100% on RA, normal by my interpretation.   COORDINATION OF CARE: 12:04 AM- Will order labs and Reglan. Pt verbalizes understanding and agrees to plan.  Medications  metoCLOPramide (REGLAN) injection 10 mg (10 mg Intravenous Given 05/30/15  0036)  sodium chloride 0.9 % bolus 1,000 mL (0 mLs Intravenous Stopped 05/30/15 0153)  sodium chloride 0.9 % bolus 1,000 mL (0 mLs Intravenous Stopped 05/30/15 0536)   Labs Review Labs Reviewed  COMPREHENSIVE METABOLIC PANEL - Abnormal; Notable for the following:    CO2 21 (*)    Glucose, Bld 132 (*)    Calcium 10.6 (*)    Total Protein 8.8 (*)    Albumin 5.3 (*)    AST 44 (*)    Alkaline Phosphatase 144 (*)    GFR calc non Af Amer 55 (*)    All other components within normal limits  CBC - Abnormal; Notable for the following:    MCH 35.1 (*)    Platelets 62 (*)    All other components within normal limits  URINALYSIS, ROUTINE W REFLEX MICROSCOPIC (NOT AT [facility]) - Abnormal; Notable for the following:    Hgb urine dipstick TRACE (*)    All other components within normal limits  URINE MICROSCOPIC-ADD ON - Abnormal; Notable for the following:    Squamous Epithelial / LPF 0-5 (*)    Bacteria, UA RARE (*)    All other components within normal limits  LIPASE, BLOOD    Imaging Review No results found. I have personally reviewed and evaluated these lab results as part of my medical decision-making.   EKG Interpretation   Date/Time:  Friday May 30 2015 00:26:35 EDT Ventricular Rate:  88 PR Interval:  203 QRS Duration: 91 QT Interval:  381 QTC Calculation: 461 R Axis:   63 Text Interpretation:  Atrial-paced rhythm Anteroseptal infarct, age  indeterminate Lateral leads are also involved since previous EKG, now  atrial paced rhythm Confirmed by Draper Gallon MD, Keenen Roessner [phone]) on 05/30/2015  12:29:31 AM Also confirmed by Yossi Hinchman MD, Kavi Almquist [phone]), editor [place]   CCT, [place] ([id number])  on 05/30/2015 7:20:10 AM     Medications  metoCLOPramide (REGLAN) injection 10 mg (10 mg Intravenous Given 05/30/15 0036)  sodium chloride 0.9 % bolus 1,000 mL (0 mLs Intravenous Stopped 05/30/15 0153)  sodium chloride 0.9 % bolus 1,000 mL (0 mLs Intravenous Stopped 05/30/15 0536)    MDM   Final diagnoses:   Nausea  Essential hypertension   Patient with history of colon cancer status post colostomy presents with nausea that began this evening as well as lightheadedness. Patient became concerned when she noted hypertension at home. On arrival, the patient was well-appearing and pleasant. Vital signs notable for hypertension at 167/123. No abdominal tenderness and normal stool in colostomy bag. The patient stated she was concerned about her husband at home and demanded that she stay here until the morning. She  became very anxious, tearful, and tachypneic which was an abrupt change from her initial calm and pleasant demeanor. I explained that we would evaluate for life-threatening process but that we could not admit her if work up was reassuring. She then stated, "I can't go on like this at home." I asked what she meant by this and she stated she couldn't be at home with this blood pressure. I explained that rapidly lowering her BP could be dangerous and she would need to follow-up with her PCP to discuss blood pressure management with oral medications. Pt then began complaining of multiple complaints with a completely different story than she told previously. 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   EKG shows atrial paced rhythm. Gave the patient Reglan and an IV fluid bolus. Obtained above lab work to evaluate for dehydration or evidence of infection. Labs show normal Cr, AST and alk phos similar to previous, normal CBC, UA without signs of infection or dehydration. Pt able to tolerate fluids w/ no vomiting in ED. She became very upset at thought of discharge as she was concerned about her husband driving at night, so she was observed for a few hours until the morning. On reexamination, she was comfortable and calm. Emphasized importance of PCP f/u regarding BP and other concerns. Pt had mentioned  concern about walking and balance; I confirmed that she already has a walker to use at home. Pt discharged in satisfactory condition.  I personally performed the services described in this documentation, which was scribed in my presence. The recorded information has been reviewed and is accurate.     Tamara Iles, MD 05/30/15 [phone]

## 2015-05-30 ENCOUNTER — Other Ambulatory Visit: Payer: Self-pay

## 2015-05-30 DIAGNOSIS — I10 Essential (primary) hypertension: Secondary | ICD-10-CM | POA: Diagnosis not present

## 2015-05-30 LAB — CBC
HCT: 38.6 % (ref 36.0–46.0)
Hemoglobin: 13.6 g/dL (ref 12.0–15.0)
MCH: 35.1 pg — AB (ref 26.0–34.0)
MCHC: 35.2 g/dL (ref 30.0–36.0)
MCV: 99.5 fL (ref 78.0–100.0)
PLATELETS: 62 10*3/uL — AB (ref 150–400)
RBC: 3.88 MIL/uL (ref 3.87–5.11)
RDW: 12.8 % (ref 11.5–15.5)
WBC: 6.2 10*3/uL (ref 4.0–10.5)

## 2015-05-30 LAB — COMPREHENSIVE METABOLIC PANEL
ALT: 25 U/L (ref 14–54)
AST: 44 U/L — ABNORMAL HIGH (ref 15–41)
Albumin: 5.3 g/dL — ABNORMAL HIGH (ref 3.5–5.0)
Alkaline Phosphatase: 144 U/L — ABNORMAL HIGH (ref 38–126)
Anion gap: 12 (ref 5–15)
BUN: 15 mg/dL (ref 6–20)
CHLORIDE: 102 mmol/L (ref 101–111)
CO2: 21 mmol/L — AB (ref 22–32)
CREATININE: 0.93 mg/dL (ref 0.44–1.00)
Calcium: 10.6 mg/dL — ABNORMAL HIGH (ref 8.9–10.3)
GFR calc non Af Amer: 55 mL/min — ABNORMAL LOW (ref >60)
Glucose, Bld: 132 mg/dL — ABNORMAL HIGH (ref 65–99)
POTASSIUM: 4.1 mmol/L (ref 3.5–5.1)
SODIUM: 135 mmol/L (ref 135–145)
Total Bilirubin: 1 mg/dL (ref 0.3–1.2)
Total Protein: 8.8 g/dL — ABNORMAL HIGH (ref 6.5–8.1)

## 2015-05-30 LAB — URINE MICROSCOPIC-ADD ON: WBC UA: NONE SEEN WBC/hpf (ref 0–5)

## 2015-05-30 LAB — LIPASE, BLOOD: Lipase: 27 U/L (ref 11–51)

## 2015-05-30 LAB — URINALYSIS, ROUTINE W REFLEX MICROSCOPIC
BILIRUBIN URINE: NEGATIVE
Glucose, UA: NEGATIVE mg/dL
Ketones, ur: NEGATIVE mg/dL
LEUKOCYTES UA: NEGATIVE
NITRITE: NEGATIVE
PH: 7 (ref 5.0–8.0)
Protein, ur: NEGATIVE mg/dL
SPECIFIC GRAVITY, URINE: 1.01 (ref 1.005–1.030)

## 2015-05-30 MED ORDER — SODIUM CHLORIDE 0.9 % IV BOLUS (SEPSIS)
1000.0000 mL | Freq: Once | INTRAVENOUS | Status: AC
  Administered 2015-05-30: 1000 mL via INTRAVENOUS

## 2015-05-30 MED ORDER — HEPARIN SOD (PORK) LOCK FLUSH 100 UNIT/ML IV SOLN
500.0000 [IU] | Freq: Once | INTRAVENOUS | Status: AC
  Administered 2015-05-30: 500 [IU]
  Filled 2015-05-30: qty 5

## 2015-05-30 NOTE — ED Notes (Signed)
Pt provided with fluids and encouraged to drink

## 2015-05-30 NOTE — ED Notes (Signed)
Pt tolerating fluids.   

## 2015-05-30 NOTE — Progress Notes (Signed)
Subjective:     Patient ID: Tamara [name], female   DOB: [birth year], 82 y.o.   MRN: [id number]  HPI patient states that she has damage to her right big toenail and that it was lifting and she is concerned that it may be infected or she's going to lose it   Review of Systems  All other systems reviewed and are negative.      Objective:   Physical Exam  Constitutional: She is oriented to person, place, and time.  Cardiovascular: Intact distal pulses.   Musculoskeletal: Normal range of motion.  Neurological: She is oriented to person, place, and time.  Skin: Skin is warm and dry.  Nursing note and vitals reviewed.  neurovascular status intact muscle strength adequate range of motion within normal limits with patient noted to have a damaged right hallux nail that's pumped in the middle and relatively loose when pressed in the distal direction. I did not note any odor or drainage and she states it's been this way for several months     Assessment:     Probable trauma to the right hallux nailbed creating looseness and minimal discomfort    Plan:     H&P and condition educated with patient and caregiver. At this point I do not recommend removal and less it were to get worse and then we will consider doing a procedure to remove the nail and this will occur at that should start draining come increasingly looser or become painful

## 2015-06-03 DIAGNOSIS — I10 Essential (primary) hypertension: Secondary | ICD-10-CM | POA: Diagnosis not present

## 2015-06-03 DIAGNOSIS — R197 Diarrhea, unspecified: Secondary | ICD-10-CM | POA: Diagnosis not present

## 2015-06-06 IMAGING — CR DG CHEST 2V
1 series · 1 of 1 positions shown · non-contrast
Comparison: 08/29/2012

CLINICAL DATA: Pain

EXAM:
CHEST  2 VIEW

[view not recorded]
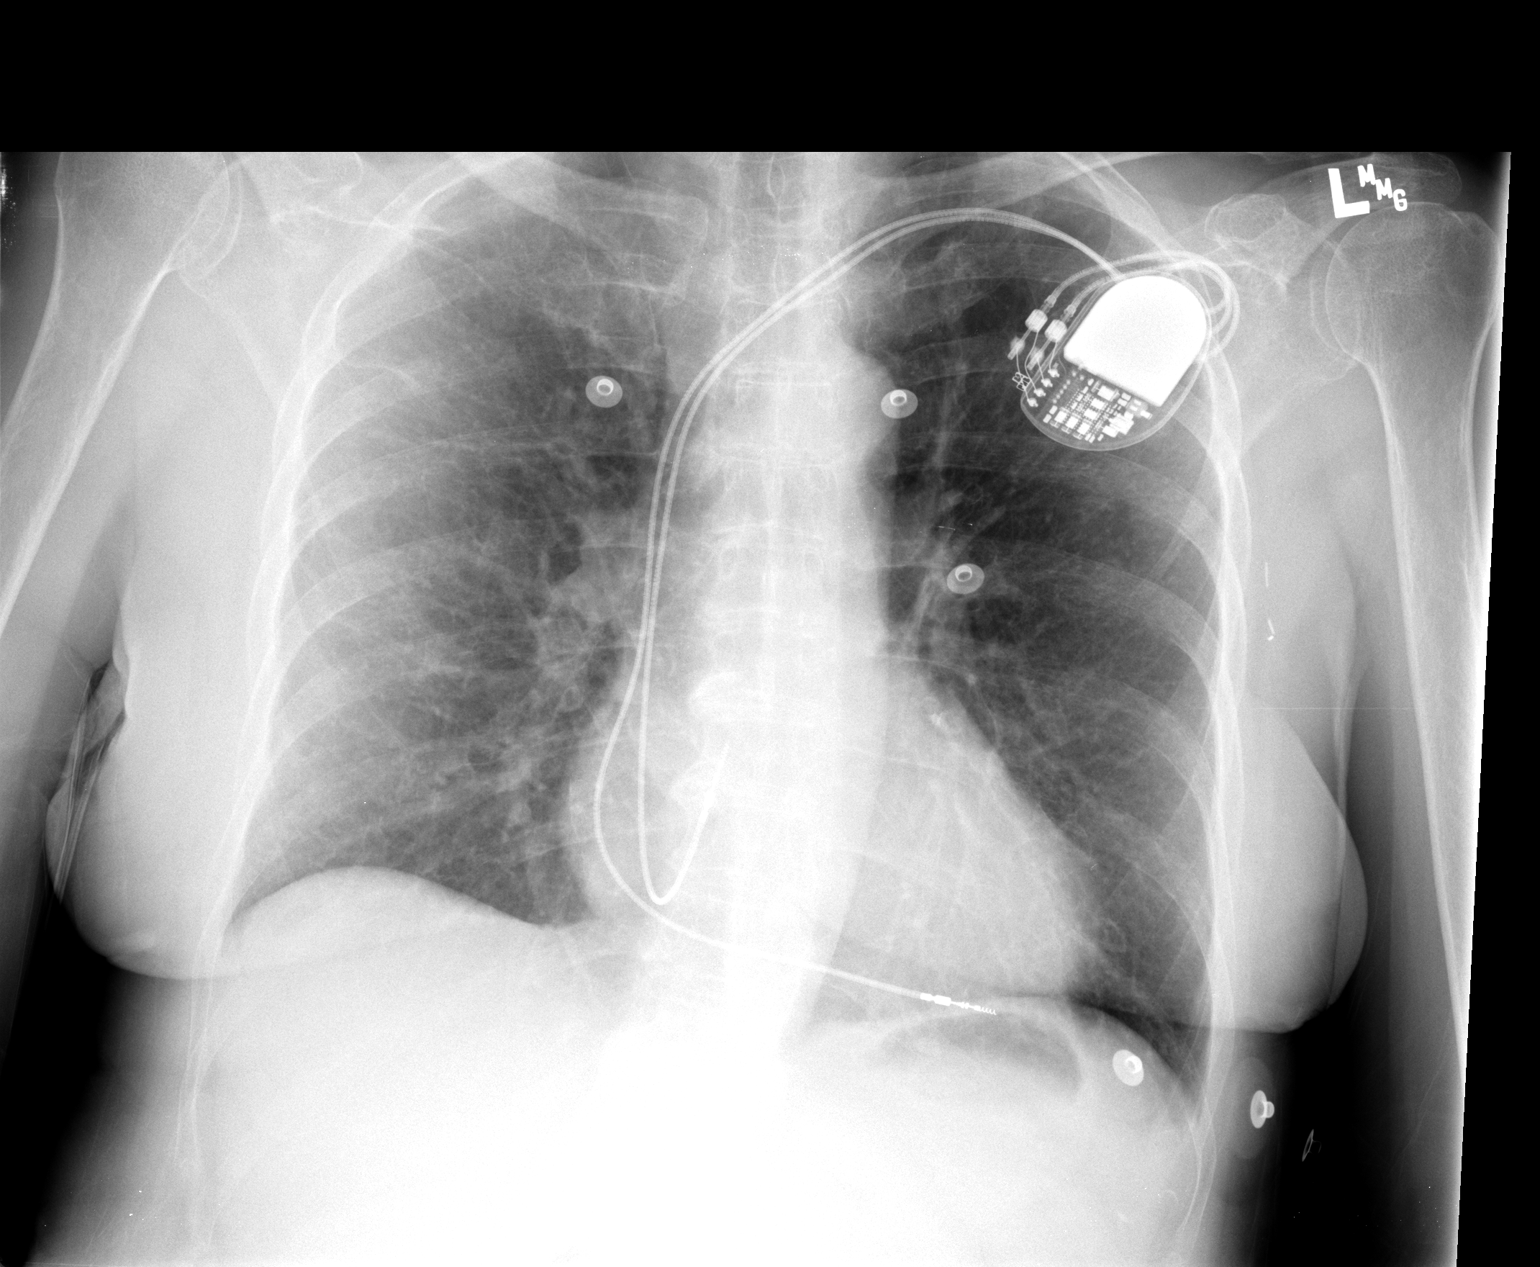

[1 of 1 positions shown; findings below may reference images not displayed]

FINDINGS: A pacing device is again seen. The cardiac shadow is stable. The
lungs are well aerated bilaterally without focal infiltrate or
sizable effusion. No acute bony abnormality is seen.
IMPRESSION: No acute abnormality noted.

## 2015-06-10 DIAGNOSIS — I10 Essential (primary) hypertension: Secondary | ICD-10-CM | POA: Diagnosis not present

## 2015-06-10 DIAGNOSIS — R197 Diarrhea, unspecified: Secondary | ICD-10-CM | POA: Diagnosis not present

## 2015-06-10 DIAGNOSIS — R5383 Other fatigue: Secondary | ICD-10-CM | POA: Diagnosis not present

## 2015-06-13 ENCOUNTER — Other Ambulatory Visit: Payer: Self-pay | Admitting: *Deleted

## 2015-06-13 MED ORDER — DIPHENOXYLATE-ATROPINE 2.5-0.025 MG PO TABS: ORAL_TABLET | ORAL | Status: DC

## 2015-06-18 ENCOUNTER — Other Ambulatory Visit: Payer: Self-pay | Admitting: Hematology & Oncology

## 2015-06-18 ENCOUNTER — Ambulatory Visit (HOSPITAL_BASED_OUTPATIENT_CLINIC_OR_DEPARTMENT_OTHER): Payer: Medicare Other

## 2015-06-18 ENCOUNTER — Telehealth: Payer: Self-pay | Admitting: *Deleted

## 2015-06-18 VITALS — BP 158/69 | HR 86 | Temp 98.0°F | Resp 18

## 2015-06-18 DIAGNOSIS — K591 Functional diarrhea: Secondary | ICD-10-CM

## 2015-06-18 DIAGNOSIS — C189 Malignant neoplasm of colon, unspecified: Secondary | ICD-10-CM | POA: Diagnosis not present

## 2015-06-18 DIAGNOSIS — E860 Dehydration: Secondary | ICD-10-CM | POA: Diagnosis present

## 2015-06-18 MED ORDER — SODIUM CHLORIDE 0.9 % IV SOLN
Freq: Once | INTRAVENOUS | Status: AC
  Administered 2015-06-18: 12:00:00 via INTRAVENOUS

## 2015-06-18 MED ORDER — PANCRELIPASE (LIP-PROT-AMYL) 24000-76000 UNITS PO CPEP
24000.0000 [IU] | ORAL_CAPSULE | Freq: Three times a day (TID) | ORAL | Status: DC
Start: 2015-06-18 — End: 2015-08-25

## 2015-06-18 NOTE — Patient Instructions (Signed)

## 2015-06-18 NOTE — Telephone Encounter (Signed)
Patient c/o dehydration. She would like to come in for IVF today. Spoke to Dr Marin Olp and he wants the patient to come in today for IVF. Patient aware, appointment made.

## 2015-06-25 DIAGNOSIS — R197 Diarrhea, unspecified: Secondary | ICD-10-CM | POA: Diagnosis not present

## 2015-06-25 DIAGNOSIS — R5383 Other fatigue: Secondary | ICD-10-CM | POA: Diagnosis not present

## 2015-06-25 DIAGNOSIS — I10 Essential (primary) hypertension: Secondary | ICD-10-CM | POA: Diagnosis not present

## 2015-06-27 ENCOUNTER — Encounter: Payer: Self-pay | Admitting: Cardiovascular Disease

## 2015-07-02 ENCOUNTER — Other Ambulatory Visit (HOSPITAL_BASED_OUTPATIENT_CLINIC_OR_DEPARTMENT_OTHER): Payer: Medicare Other

## 2015-07-02 ENCOUNTER — Ambulatory Visit (HOSPITAL_BASED_OUTPATIENT_CLINIC_OR_DEPARTMENT_OTHER): Payer: Medicare Other | Admitting: Hematology & Oncology

## 2015-07-02 ENCOUNTER — Ambulatory Visit (HOSPITAL_BASED_OUTPATIENT_CLINIC_OR_DEPARTMENT_OTHER): Payer: Medicare Other

## 2015-07-02 VITALS — BP 181/83 | HR 88 | Temp 97.5°F | Resp 18 | Wt 127.0 lb

## 2015-07-02 DIAGNOSIS — R197 Diarrhea, unspecified: Secondary | ICD-10-CM

## 2015-07-02 DIAGNOSIS — R970 Elevated carcinoembryonic antigen [CEA]: Secondary | ICD-10-CM

## 2015-07-02 DIAGNOSIS — C189 Malignant neoplasm of colon, unspecified: Secondary | ICD-10-CM

## 2015-07-02 DIAGNOSIS — C184 Malignant neoplasm of transverse colon: Secondary | ICD-10-CM | POA: Diagnosis not present

## 2015-07-02 LAB — CBC WITH DIFFERENTIAL (CANCER CENTER ONLY)
BASO#: 0 10*3/uL (ref 0.0–0.2)
BASO%: 0.3 % (ref 0.0–2.0)
EOS ABS: 0.1 10*3/uL (ref 0.0–0.5)
EOS%: 1.5 % (ref 0.0–7.0)
HEMATOCRIT: 34.4 % — AB (ref 34.8–46.6)
HGB: 12.1 g/dL (ref 11.6–15.9)
LYMPH#: 1.4 10*3/uL (ref 0.9–3.3)
LYMPH%: 23.4 % (ref 14.0–48.0)
MCH: 36 pg — AB (ref 26.0–34.0)
MCHC: 35.2 g/dL (ref 32.0–36.0)
MCV: 102 fL — AB (ref 81–101)
MONO#: 0.8 10*3/uL (ref 0.1–0.9)
MONO%: 12.2 % (ref 0.0–13.0)
NEUT#: 3.9 10*3/uL (ref 1.5–6.5)
NEUT%: 62.6 % (ref 39.6–80.0)
Platelets: 84 10*3/uL — ABNORMAL LOW (ref 145–400)
RBC: 3.36 10*6/uL — ABNORMAL LOW (ref 3.70–5.32)
RDW: 12.7 % (ref 11.1–15.7)
WBC: 6.2 10*3/uL (ref 3.9–10.0)

## 2015-07-02 LAB — CMP (CANCER CENTER ONLY)
ALT(SGPT): 29 U/L (ref 10–47)
AST: 40 U/L — ABNORMAL HIGH (ref 11–38)
Albumin: 3.8 g/dL (ref 3.3–5.5)
Alkaline Phosphatase: 101 U/L — ABNORMAL HIGH (ref 26–84)
BUN, Bld: 22 mg/dL (ref 7–22)
CALCIUM: 10.4 mg/dL — AB (ref 8.0–10.3)
CO2: 24 meq/L (ref 18–33)
Chloride: 101 mEq/L (ref 98–108)
Creat: 1 mg/dl (ref 0.6–1.2)
GLUCOSE: 95 mg/dL (ref 73–118)
Potassium: 4.4 mEq/L (ref 3.3–4.7)
Sodium: 139 mEq/L (ref 128–145)
Total Bilirubin: 0.8 mg/dl (ref 0.20–1.60)
Total Protein: 7.2 g/dL (ref 6.4–8.1)

## 2015-07-02 MED ORDER — SODIUM CHLORIDE 0.9% FLUSH
10.0000 mL | INTRAVENOUS | Status: DC | PRN
  Administered 2015-07-02: 10 mL via INTRAVENOUS
  Filled 2015-07-02: qty 10

## 2015-07-02 MED ORDER — SODIUM CHLORIDE 0.9 % IV SOLN
1000.0000 mL | Freq: Once | INTRAVENOUS | Status: AC
Start: 2015-07-02 — End: 2015-07-02
  Administered 2015-07-02: 1000 mL via INTRAVENOUS

## 2015-07-02 MED ORDER — HEPARIN SOD (PORK) LOCK FLUSH 100 UNIT/ML IV SOLN
500.0000 [IU] | Freq: Once | INTRAVENOUS | Status: AC
Start: 2015-07-02 — End: 2015-07-02
  Administered 2015-07-02: 500 [IU] via INTRAVENOUS
  Filled 2015-07-02: qty 5

## 2015-07-02 NOTE — Progress Notes (Signed)
Hematology and Oncology Follow Up Visit  Tamara [name] [id number] 18-Aug-1929 82 y.o. 07/02/2015   Principle Diagnosis:   Recurrent/metastatic colon cancer - Progressive  Current Therapy:     Status post palliative radiation therapy to the pelvis for rectal bleeding. IV fluids as indicated.      Interim History:  Tamara [name] is back for follow-up. She looks quite good. She really has no specific complaints. [name]. We tried her on Creon and for some reason, she did not take this.  Her CEA has been going up quickly. We checked back in May, the CEA was 393. She is not symptomatically with this. She only gets IV fluids. She really feels IV fluids is making a big difference for her.  She's had no bleeding. She did have some rectal bleeding and she took some radiation therapy for this.  She has had no cough. She has had no shortness of breath. She has had no fever.  Currently, her performance status is ECOG 2   Medications:  Current outpatient prescriptions:  .  antiseptic oral rinse (BIOTENE) LIQD, 15 mLs by Mouth Rinse route 5 (five) times daily. After brushing teeth, Disp: , Rfl:  .  b complex vitamins tablet, Take 1 tablet by mouth every other day., Disp: , Rfl:  .  B Complex-C (SUPER B COMPLEX PO), Take 1 tablet by mouth every other day. Alternating days with B complex, Disp: , Rfl:  .  Biotin 5 MG TABS, Take 1 tablet by mouth every morning. , Disp: , Rfl:  .  Cholecalciferol (VITAMIN D) 2000 UNITS tablet, Take 2,000 Units by mouth daily with breakfast., Disp: , Rfl:  .  Dentifrices (BIOTENE DRY MOUTH) PSTE, Place 1 application onto teeth 5 (five) times daily., Disp: , Rfl:  .  diphenoxylate-atropine (LOMOTIL) 2.5-0.025 MG tablet, take 2 tablets by mouth four times a day, Disp: 240 tablet, Rfl: 2 .  feeding supplement, ENSURE COMPLETE, (ENSURE COMPLETE) LIQD, Take 237 mLs by mouth 2 (two) times daily between meals. *Vanilla*, Disp: , Rfl:    .  lidocaine-prilocaine (EMLA) cream, Apply 1 application topically as needed. Apply to port site one hour before access, Disp: 30 g, Rfl: 0 .  lipase/protease/amylase 24000 units CPEP, Take 1 capsule (24,000 Units total) by mouth 3 (three) times daily with meals., Disp: 90 capsule, Rfl: 3 .  Multiple Vitamin (MULTIVITAMIN WITH MINERALS) TABS tablet, Take 1 tablet by mouth daily., Disp: , Rfl:   Allergies:  Allergies  Allergen Reactions  . Codeine Nausea And Vomiting  . Zofran [Ondansetron Hcl] Nausea And Vomiting  . Ciprofloxacin     Confusion/dizziness/fatigue  . Metronidazole     Confusion/dizziness/fatigue  . Skelaxin [Metaxalone]     Too strong  . Tetracycline Hcl     Not tolerated  . Avelox [Moxifloxacin] Rash    Past Medical History, Surgical history, Social history, and Family History were reviewed and updated.  Review of Systems: As above  Physical Exam:  weight is 127 lb (57.607 kg). Her oral temperature is 97.5 F (36.4 C). Her blood pressure is 181/83 and her pulse is 88. Her respiration is 18.   Wt Readings from Last 3 Encounters:  07/02/15 127 lb (57.607 kg)  05/21/15 131 lb (59.421 kg)  04/18/15 129 lb 1.9 oz (58.568 kg)     Well-developed and well-nourished elderly female in no obvious distress. Head and neck exam shows no ocular or oral lesions. There is no palpable  cervical or supraclavicular lymph nodes. Lungs are clear. Cardiac exam regular rate and rhythm with no murmurs, rubs or bruits. Abdomen is soft. She has good bowel sounds. There is no fluid wave. There is no palpable liver or spleen tip. There is no obvious abdominal mass. There is no guarding or rebound tenderness. Extremities shows no clubbing, cyanosis or edema. Neurological exam shows no focal neurological deficits. Skin exam shows no rashes, ecchymoses or petechia.  Lab Results  Component Value Date   WBC 6.2 07/02/2015   HGB 12.1 07/02/2015   HCT 34.4* 07/02/2015   MCV 102* 07/02/2015    PLT 84* 07/02/2015     Chemistry      Component Value Date/Time   NA 139 07/02/2015 1025   NA 135 05/29/2015 2326   NA 130* 02/25/2013 0835   K 4.4 07/02/2015 1025   K 4.1 05/29/2015 2326   K 5.6* 02/25/2013 0835   CL 101 07/02/2015 1025   CL 102 05/29/2015 2326   CL 102 02/25/2013 0835   CO2 24 07/02/2015 1025   CO2 21* 05/29/2015 2326   CO2 25 02/25/2013 0835   BUN 22 07/02/2015 1025   BUN 15 05/29/2015 2326   BUN 32* 02/25/2013 0835   CREATININE 1.0 07/02/2015 1025   CREATININE 0.93 05/29/2015 2326   CREATININE 1.07 02/25/2013 0835      Component Value Date/Time   CALCIUM 10.4* 07/02/2015 1025   CALCIUM 10.6* 05/29/2015 2326   CALCIUM 10.1 02/25/2013 0835   ALKPHOS 101* 07/02/2015 1025   ALKPHOS 144* 05/29/2015 2326   AST 40* 07/02/2015 1025   AST 44* 05/29/2015 2326   ALT 29 07/02/2015 1025   ALT 25 05/29/2015 2326   BILITOT 0.80 07/02/2015 1025   BILITOT 1.0 05/29/2015 2326         Impression and Plan: Tamara [name] is 82 year old white female with metastatic colon cancer. She's off therapy now. She really had a very difficult time with therapy. Her quality of life was not improved.  For now, her quality of life is doing much better. I Really believe that the IV fluids that she gets really help her out.  She will not take any further therapy. As such, I do not see that we have to do any scans on her. Even though her CEA is going up, she is totally asymptomatic.  She gets IV fluids on occasion. I think this is all we really need to do with her.  She really is enjoying a good quality of life. She is able to take care of her husband who has squamous cell cancer of the skin.  I told her that she always calls back with the need for IV fluids.  We will plan to get her back in one more month.   Volanda Napoleon, MD 6/7/201711:53 AM

## 2015-07-02 NOTE — Patient Instructions (Signed)

## 2015-07-03 LAB — PREALBUMIN: PREALBUMIN: 23 mg/dL (ref 9–32)

## 2015-07-03 LAB — CEA: CEA: 667.6 ng/mL — ABNORMAL HIGH (ref 0.0–4.7)

## 2015-07-15 ENCOUNTER — Ambulatory Visit (HOSPITAL_BASED_OUTPATIENT_CLINIC_OR_DEPARTMENT_OTHER): Payer: Medicare Other

## 2015-07-15 VITALS — BP 158/63 | HR 73 | Temp 97.9°F | Resp 18

## 2015-07-15 DIAGNOSIS — R197 Diarrhea, unspecified: Secondary | ICD-10-CM | POA: Diagnosis present

## 2015-07-15 DIAGNOSIS — C189 Malignant neoplasm of colon, unspecified: Secondary | ICD-10-CM | POA: Diagnosis not present

## 2015-07-15 MED ORDER — HEPARIN SOD (PORK) LOCK FLUSH 100 UNIT/ML IV SOLN
500.0000 [IU] | Freq: Once | INTRAVENOUS | Status: AC
  Administered 2015-07-15: 500 [IU] via INTRAVENOUS
  Filled 2015-07-15: qty 5

## 2015-07-15 MED ORDER — SODIUM CHLORIDE 0.9% FLUSH
10.0000 mL | INTRAVENOUS | Status: DC | PRN
  Administered 2015-07-15: 10 mL via INTRAVENOUS
  Filled 2015-07-15: qty 10

## 2015-07-15 MED ORDER — SODIUM CHLORIDE 0.9 % IV SOLN
1000.0000 mL | Freq: Once | INTRAVENOUS | Status: AC
Start: 2015-07-15 — End: 2015-07-15
  Administered 2015-07-15: 1000 mL via INTRAVENOUS

## 2015-07-15 NOTE — Patient Instructions (Signed)

## 2015-08-04 ENCOUNTER — Other Ambulatory Visit (HOSPITAL_BASED_OUTPATIENT_CLINIC_OR_DEPARTMENT_OTHER): Payer: Medicare Other

## 2015-08-04 ENCOUNTER — Encounter: Payer: Self-pay | Admitting: Hematology & Oncology

## 2015-08-04 ENCOUNTER — Ambulatory Visit (HOSPITAL_BASED_OUTPATIENT_CLINIC_OR_DEPARTMENT_OTHER): Payer: Medicare Other

## 2015-08-04 ENCOUNTER — Ambulatory Visit (HOSPITAL_BASED_OUTPATIENT_CLINIC_OR_DEPARTMENT_OTHER): Payer: Medicare Other | Admitting: Hematology & Oncology

## 2015-08-04 VITALS — BP 162/77 | HR 86 | Temp 97.5°F | Resp 18 | Ht 66.0 in | Wt 126.0 lb

## 2015-08-04 DIAGNOSIS — C189 Malignant neoplasm of colon, unspecified: Secondary | ICD-10-CM | POA: Diagnosis not present

## 2015-08-04 DIAGNOSIS — C186 Malignant neoplasm of descending colon: Secondary | ICD-10-CM | POA: Diagnosis not present

## 2015-08-04 DIAGNOSIS — R970 Elevated carcinoembryonic antigen [CEA]: Secondary | ICD-10-CM

## 2015-08-04 DIAGNOSIS — C187 Malignant neoplasm of sigmoid colon: Secondary | ICD-10-CM

## 2015-08-04 LAB — CBC WITH DIFFERENTIAL (CANCER CENTER ONLY)
BASO#: 0 10*3/uL (ref 0.0–0.2)
BASO%: 0.2 % (ref 0.0–2.0)
EOS%: 1.8 % (ref 0.0–7.0)
Eosinophils Absolute: 0.1 10*3/uL (ref 0.0–0.5)
HEMATOCRIT: 33.8 % — AB (ref 34.8–46.6)
HGB: 11.8 g/dL (ref 11.6–15.9)
LYMPH#: 1.1 10*3/uL (ref 0.9–3.3)
LYMPH%: 19.3 % (ref 14.0–48.0)
MCH: 36.5 pg — ABNORMAL HIGH (ref 26.0–34.0)
MCHC: 34.9 g/dL (ref 32.0–36.0)
MCV: 105 fL — ABNORMAL HIGH (ref 81–101)
MONO#: 0.8 10*3/uL (ref 0.1–0.9)
MONO%: 14 % — ABNORMAL HIGH (ref 0.0–13.0)
NEUT#: 3.7 10*3/uL (ref 1.5–6.5)
NEUT%: 64.7 % (ref 39.6–80.0)
Platelets: 99 10*3/uL — ABNORMAL LOW (ref 145–400)
RBC: 3.23 10*6/uL — ABNORMAL LOW (ref 3.70–5.32)
RDW: 12.9 % (ref 11.1–15.7)
WBC: 5.7 10*3/uL (ref 3.9–10.0)

## 2015-08-04 LAB — CMP (CANCER CENTER ONLY)
ALT: 24 U/L (ref 10–47)
AST: 47 U/L — AB (ref 11–38)
Albumin: 3.6 g/dL (ref 3.3–5.5)
Alkaline Phosphatase: 108 U/L — ABNORMAL HIGH (ref 26–84)
BILIRUBIN TOTAL: 0.8 mg/dL (ref 0.20–1.60)
BUN: 25 mg/dL — AB (ref 7–22)
CALCIUM: 9.8 mg/dL (ref 8.0–10.3)
CO2: 28 mEq/L (ref 18–33)
Chloride: 101 mEq/L (ref 98–108)
Creat: 0.8 mg/dl (ref 0.6–1.2)
GLUCOSE: 87 mg/dL (ref 73–118)
Potassium: 4.2 mEq/L (ref 3.3–4.7)
Sodium: 135 mEq/L (ref 128–145)
TOTAL PROTEIN: 7.1 g/dL (ref 6.4–8.1)

## 2015-08-04 MED ORDER — SODIUM CHLORIDE 0.9 % IV SOLN
1000.0000 mL | Freq: Once | INTRAVENOUS | Status: AC
  Administered 2015-08-04: 1000 mL via INTRAVENOUS

## 2015-08-04 MED ORDER — SODIUM CHLORIDE 0.9% FLUSH
10.0000 mL | INTRAVENOUS | Status: DC | PRN
  Administered 2015-08-04: 10 mL via INTRAVENOUS
  Filled 2015-08-04: qty 10

## 2015-08-04 MED ORDER — HEPARIN SOD (PORK) LOCK FLUSH 100 UNIT/ML IV SOLN
500.0000 [IU] | Freq: Once | INTRAVENOUS | Status: AC
  Administered 2015-08-04: 500 [IU] via INTRAVENOUS
  Filled 2015-08-04: qty 5

## 2015-08-04 NOTE — Progress Notes (Signed)
Hematology and Oncology Follow Up Visit  Tamara [name] [id number] 10/02/1929 82 y.o. 08/04/2015   Principle Diagnosis:   Recurrent/metastatic colon cancer - Progressive  Current Therapy:     Status post palliative radiation therapy to the pelvis for rectal bleeding. IV fluids as indicated.      Interim History:  Tamara [name] is back for follow-up. She looks quite good. She really has no specific complaints. She had a very good birthday last week. Unfortunately, when she and her husband went out, he fell. Thankfully, he did not break anything.  Her CEA is going up quickly. Her last CEA was over 800. Prior to that, it was about 350. I talked to her about this. She understands what this means. Again, she does not want any chemotherapy. I think that therapy would have more detriment than benefit. At some point, she will clearly need hospice. She will need help to take care of her husband.  There's not been any problems with pain. She's had no nausea or vomiting. She's had no rashes. She's had no specific issues with her colostomy.  There's been no bleeding.   Currently, her performance status is ECOG 2   Medications:  Current outpatient prescriptions:  .  antiseptic oral rinse (BIOTENE) LIQD, 15 mLs by Mouth Rinse route 5 (five) times daily. After brushing teeth, Disp: , Rfl:  .  b complex vitamins tablet, Take 1 tablet by mouth every other day., Disp: , Rfl:  .  B Complex-C (SUPER B COMPLEX PO), Take 1 tablet by mouth every other day. Alternating days with B complex, Disp: , Rfl:  .  Biotin 5 MG TABS, Take 1 tablet by mouth every morning. , Disp: , Rfl:  .  Cholecalciferol (VITAMIN D) 2000 UNITS tablet, Take 2,000 Units by mouth daily with breakfast., Disp: , Rfl:  .  cholestyramine light (PREVALITE) 4 GM/DOSE powder, TAKE 1/2 SCOOP BY MOUTH BETWEEN MEALS AS NEEDED FOR FREQUENT STOOLS FOR 30 DAYS, Disp: , Rfl: 0 .  Dentifrices (BIOTENE DRY MOUTH) PSTE, Place 1 application onto teeth  5 (five) times daily., Disp: , Rfl:  .  diphenoxylate-atropine (LOMOTIL) 2.5-0.025 MG tablet, take 2 tablets by mouth four times a day, Disp: 240 tablet, Rfl: 2 .  feeding supplement, ENSURE COMPLETE, (ENSURE COMPLETE) LIQD, Take 237 mLs by mouth 2 (two) times daily between meals. *Vanilla*, Disp: , Rfl:  .  lidocaine-prilocaine (EMLA) cream, Apply 1 application topically as needed. Apply to port site one hour before access, Disp: 30 g, Rfl: 0 .  lipase/protease/amylase 24000 units CPEP, Take 1 capsule (24,000 Units total) by mouth 3 (three) times daily with meals., Disp: 90 capsule, Rfl: 3 .  Multiple Vitamin (MULTIVITAMIN WITH MINERALS) TABS tablet, Take 1 tablet by mouth daily., Disp: , Rfl:  No current facility-administered medications for this visit.  Facility-Administered Medications Ordered in Other Visits:  .  0.9 %  sodium chloride infusion, 1,000 mL, Intravenous, Once, Tamara Napoleon, MD, Last Rate: 500 mL/hr at 08/04/15 0945, 1,000 mL at 08/04/15 0945  Allergies:  Allergies  Allergen Reactions  . Codeine Nausea And Vomiting  . Zofran [Ondansetron Hcl] Nausea And Vomiting  . Ciprofloxacin     Confusion/dizziness/fatigue  . Metronidazole     Confusion/dizziness/fatigue  . Skelaxin [Metaxalone]     Too strong  . Tetracycline Hcl     Not tolerated  . Avelox [Moxifloxacin] Rash    Past Medical History, Surgical history, Social history, and Family History were reviewed and updated.  Review of Systems: As above  Physical Exam:  height is 5\' 6"  (1.676 m) and weight is 126 lb 0.6 oz (57.171 kg). Her oral temperature is 97.5 F (36.4 C). Her blood pressure is 162/77 and her pulse is 86. Her respiration is 18.   Wt Readings from Last 3 Encounters:  08/04/15 126 lb 0.6 oz (57.171 kg)  07/02/15 127 lb (57.607 kg)  05/21/15 131 lb (59.421 kg)     Well-developed and well-nourished elderly female in no obvious distress. Head and neck exam shows no ocular or oral lesions. There  is no palpable cervical or supraclavicular lymph nodes. Lungs are clear. Cardiac exam regular rate and rhythm with no murmurs, rubs or bruits. Abdomen is soft. She has good bowel sounds. There is no fluid wave. There is no palpable liver or spleen tip. There is no obvious abdominal mass. There is no guarding or rebound tenderness. Extremities shows no clubbing, cyanosis or edema. Neurological exam shows no focal neurological deficits. Skin exam shows no rashes, ecchymoses or petechia.  Lab Results  Component Value Date   WBC 5.7 08/04/2015   HGB 11.8 08/04/2015   HCT 33.8* 08/04/2015   MCV 105* 08/04/2015   PLT 99* 08/04/2015     Chemistry      Component Value Date/Time   NA 139 07/02/2015 1025   NA 135 05/29/2015 2326   NA 130* 02/25/2013 0835   K 4.4 07/02/2015 1025   K 4.1 05/29/2015 2326   K 5.6* 02/25/2013 0835   CL 101 07/02/2015 1025   CL 102 05/29/2015 2326   CL 102 02/25/2013 0835   CO2 24 07/02/2015 1025   CO2 21* 05/29/2015 2326   CO2 25 02/25/2013 0835   BUN 22 07/02/2015 1025   BUN 15 05/29/2015 2326   BUN 32* 02/25/2013 0835   CREATININE 1.0 07/02/2015 1025   CREATININE 0.93 05/29/2015 2326   CREATININE 1.07 02/25/2013 0835      Component Value Date/Time   CALCIUM 10.4* 07/02/2015 1025   CALCIUM 10.6* 05/29/2015 2326   CALCIUM 10.1 02/25/2013 0835   ALKPHOS 101* 07/02/2015 1025   ALKPHOS 144* 05/29/2015 2326   AST 40* 07/02/2015 1025   AST 44* 05/29/2015 2326   ALT 29 07/02/2015 1025   ALT 25 05/29/2015 2326   BILITOT 0.80 07/02/2015 1025   BILITOT 1.0 05/29/2015 2326         Impression and Plan: Tamara [name] is 82 year old white female with metastatic colon cancer. She's off therapy now. She really had a very difficult time with therapy. Her quality of life was not improved.  For now, her quality of life is doing much better. I really believe that the IV fluids that she gets really help her out.  She will not take any further therapy. As such, I do  not see that we have to do any scans on her. Even though her CEA is going up, she is totally asymptomatic.  She gets IV fluids on occasion. I think this is all we really need to do with her.  She really is enjoying a good quality of life. She is able to take care of her husband who has squamous cell cancer of the skin.  I told her that she always calls back with the need for IV fluids.  We will plan to get her back in one more month.   Tamara Napoleon, MD 7/10/20179:47 AM

## 2015-08-04 NOTE — Patient Instructions (Signed)

## 2015-08-05 LAB — CEA

## 2015-08-12 DIAGNOSIS — L570 Actinic keratosis: Secondary | ICD-10-CM | POA: Diagnosis not present

## 2015-08-25 ENCOUNTER — Ambulatory Visit (HOSPITAL_BASED_OUTPATIENT_CLINIC_OR_DEPARTMENT_OTHER): Payer: Medicare Other

## 2015-08-25 ENCOUNTER — Other Ambulatory Visit (HOSPITAL_BASED_OUTPATIENT_CLINIC_OR_DEPARTMENT_OTHER): Payer: Medicare Other

## 2015-08-25 ENCOUNTER — Encounter: Payer: Self-pay | Admitting: Hematology & Oncology

## 2015-08-25 ENCOUNTER — Ambulatory Visit (HOSPITAL_BASED_OUTPATIENT_CLINIC_OR_DEPARTMENT_OTHER): Payer: Medicare Other | Admitting: Hematology & Oncology

## 2015-08-25 ENCOUNTER — Other Ambulatory Visit: Payer: Self-pay | Admitting: Family

## 2015-08-25 VITALS — BP 167/72 | HR 87 | Temp 97.6°F | Resp 16 | Ht 66.0 in | Wt 125.1 lb

## 2015-08-25 DIAGNOSIS — Z933 Colostomy status: Secondary | ICD-10-CM | POA: Diagnosis not present

## 2015-08-25 DIAGNOSIS — R197 Diarrhea, unspecified: Secondary | ICD-10-CM

## 2015-08-25 DIAGNOSIS — C187 Malignant neoplasm of sigmoid colon: Secondary | ICD-10-CM

## 2015-08-25 DIAGNOSIS — E860 Dehydration: Secondary | ICD-10-CM

## 2015-08-25 DIAGNOSIS — C189 Malignant neoplasm of colon, unspecified: Secondary | ICD-10-CM

## 2015-08-25 LAB — CMP (CANCER CENTER ONLY)
ALK PHOS: 113 U/L — AB (ref 26–84)
ALT: 28 U/L (ref 10–47)
AST: 51 U/L — AB (ref 11–38)
Albumin: 4.1 g/dL (ref 3.3–5.5)
BUN: 24 mg/dL — AB (ref 7–22)
CALCIUM: 9.9 mg/dL (ref 8.0–10.3)
CO2: 28 mEq/L (ref 18–33)
Chloride: 100 mEq/L (ref 98–108)
Creat: 0.8 mg/dl (ref 0.6–1.2)
GLUCOSE: 94 mg/dL (ref 73–118)
POTASSIUM: 4.3 meq/L (ref 3.3–4.7)
Sodium: 135 mEq/L (ref 128–145)
Total Bilirubin: 0.9 mg/dl (ref 0.20–1.60)
Total Protein: 7.2 g/dL (ref 6.4–8.1)

## 2015-08-25 LAB — CBC WITH DIFFERENTIAL (CANCER CENTER ONLY)
BASO#: 0 10*3/uL (ref 0.0–0.2)
BASO%: 0.3 % (ref 0.0–2.0)
EOS ABS: 0.1 10*3/uL (ref 0.0–0.5)
EOS%: 1.8 % (ref 0.0–7.0)
HCT: 34 % — ABNORMAL LOW (ref 34.8–46.6)
HGB: 11.8 g/dL (ref 11.6–15.9)
LYMPH#: 1.3 10*3/uL (ref 0.9–3.3)
LYMPH%: 21.6 % (ref 14.0–48.0)
MCH: 36.4 pg — AB (ref 26.0–34.0)
MCHC: 34.7 g/dL (ref 32.0–36.0)
MCV: 105 fL — ABNORMAL HIGH (ref 81–101)
MONO#: 0.8 10*3/uL (ref 0.1–0.9)
MONO%: 13.3 % — AB (ref 0.0–13.0)
NEUT#: 3.8 10*3/uL (ref 1.5–6.5)
NEUT%: 63 % (ref 39.6–80.0)
PLATELETS: 113 10*3/uL — AB (ref 145–400)
RBC: 3.24 10*6/uL — AB (ref 3.70–5.32)
RDW: 12.9 % (ref 11.1–15.7)
WBC: 6 10*3/uL (ref 3.9–10.0)

## 2015-08-25 LAB — LACTATE DEHYDROGENASE: LDH: 286 U/L — AB (ref 125–245)

## 2015-08-25 MED ORDER — LOPERAMIDE HCL 2 MG PO CAPS
ORAL_CAPSULE | ORAL | Status: AC
  Filled 2015-08-25: qty 2

## 2015-08-25 MED ORDER — LOPERAMIDE HCL 2 MG PO CAPS
4.0000 mg | ORAL_CAPSULE | Freq: Once | ORAL | Status: AC
  Administered 2015-08-25: 4 mg via ORAL

## 2015-08-25 MED ORDER — SODIUM CHLORIDE 0.9 % IV SOLN
Freq: Once | INTRAVENOUS | Status: AC
  Administered 2015-08-25: 11:00:00 via INTRAVENOUS

## 2015-08-25 MED ORDER — SODIUM CHLORIDE 0.9 % IJ SOLN
10.0000 mL | INTRAMUSCULAR | Status: DC | PRN
  Administered 2015-08-25: 10 mL
  Filled 2015-08-25: qty 10

## 2015-08-25 MED ORDER — HEPARIN SOD (PORK) LOCK FLUSH 100 UNIT/ML IV SOLN
500.0000 [IU] | Freq: Once | INTRAVENOUS | Status: AC | PRN
  Administered 2015-08-25: 500 [IU]
  Filled 2015-08-25: qty 5

## 2015-08-25 NOTE — Progress Notes (Signed)
Hematology and Oncology Follow Up Visit  Tamara [name] [id number] Jan 25, 1930 82 y.o. 82 y.o. 08/25/2015   Principle Diagnosis:   Recurrent/metastatic colon cancer - Progressive  Current Therapy:     Status post palliative radiation therapy to the pelvis for rectal bleeding. IV fluids as indicated.      Interim History:  Tamara [name] is back for follow-up. She looks quite good. She really has no specific complaints. She is enjoying the nice weather. She's able to go outside and do some yard work.  Tamara [name] husband is doing okay. He has squamous cell cancer of the skin. He is somewhat feeble. However, she is really taking great care of him.  I am absolutely amazed has to Tamara [name] quality of life despite the colon cancer progressing.  Tamara [name] CEA is going up quickly. Tamara [name] last CEA was over 1100. Prior to that, it was about 660. I talked to Tamara [name] about this. She understands what this means. Again, she does not want any chemotherapy. I think that therapy would have more detriment than benefit. At some point, she will clearly need hospice. She will need help to take care of Tamara [name] husband.  There's not been any problems with pain. She's had no nausea or vomiting. She's had no rashes. She's had no specific issues with Tamara [name] colostomy.  There's been no bleeding.   Currently, Tamara [name] performance status is ECOG 2   Medications:  Current Outpatient Prescriptions:  .  antiseptic oral rinse (BIOTENE) LIQD, 15 mLs by Mouth Rinse route 5 (five) times daily. After brushing teeth, Disp: , Rfl:  .  b complex vitamins tablet, Take 1 tablet by mouth every other day., Disp: , Rfl:  .  B Complex-C (SUPER B COMPLEX PO), Take 1 tablet by mouth every other day. Alternating days with B complex, Disp: , Rfl:  .  Biotin 5 MG TABS, Take 1 tablet by mouth every morning. , Disp: , Rfl:  .  Cholecalciferol (VITAMIN D) 2000 UNITS tablet, Take 2,000 Units by mouth daily with breakfast., Disp: , Rfl:  .  cholestyramine light (PREVALITE) 4  GM/DOSE powder, TAKE 1/2 SCOOP BY MOUTH BETWEEN MEALS AS NEEDED FOR FREQUENT STOOLS FOR 30 DAYS, Disp: , Rfl: 0 .  Dentifrices (BIOTENE DRY MOUTH) PSTE, Place 1 application onto teeth 5 (five) times daily., Disp: , Rfl:  .  diphenoxylate-atropine (LOMOTIL) 2.5-0.025 MG tablet, take 2 tablets by mouth four times a day, Disp: 240 tablet, Rfl: 2 .  feeding supplement, ENSURE COMPLETE, (ENSURE COMPLETE) LIQD, Take 237 mLs by mouth 2 (two) times daily between meals. *Vanilla*, Disp: , Rfl:  .  lidocaine-prilocaine (EMLA) cream, Apply 1 application topically as needed. Apply to port site one hour before access, Disp: 30 g, Rfl: 0 .  Multiple Vitamin (MULTIVITAMIN WITH MINERALS) TABS tablet, Take 1 tablet by mouth daily., Disp: , Rfl:   Allergies:  Allergies  Allergen Reactions  . Codeine Nausea And Vomiting  . Zofran [Ondansetron Hcl] Nausea And Vomiting  . Ciprofloxacin     Confusion/dizziness/fatigue  . Metronidazole     Confusion/dizziness/fatigue  . Skelaxin [Metaxalone]     Too strong  . Tetracycline Hcl     Not tolerated  . Avelox [Moxifloxacin] Rash    Past Medical History, Surgical history, Social history, and Family History were reviewed and updated.  Review of Systems: As above  Physical Exam:  height is 5\' 6"  (1.676 m) and weight is 125 lb 1.9 oz (56.8 kg). Tamara [name] oral temperature is 97.6 F (36.4 C). Tamara [name]  blood pressure is 167/72 (abnormal) and Tamara [name] pulse is 87. Tamara [name] respiration is 16.   Wt Readings from Last 3 Encounters:  08/25/15 125 lb 1.9 oz (56.8 kg)  08/04/15 126 lb 0.6 oz (57.2 kg)  07/02/15 127 lb (57.6 kg)     Well-developed and well-nourished elderly female in no obvious distress. Head and neck exam shows no ocular or oral lesions. There is no palpable cervical or supraclavicular lymph nodes. Lungs are clear. Cardiac exam regular rate and rhythm with no murmurs, rubs or bruits. Abdomen is soft. She has good bowel sounds. There is no fluid wave. There is no palpable  liver or spleen tip. There is no obvious abdominal mass. There is no guarding or rebound tenderness. Extremities shows no clubbing, cyanosis or edema. Neurological exam shows no focal neurological deficits. Skin exam shows no rashes, ecchymoses or petechia.  Lab Results  Component Value Date   WBC 6.0 08/25/2015   HGB 11.8 08/25/2015   HCT 34.0 (L) 08/25/2015   MCV 105 (H) 08/25/2015   PLT 113 (L) 08/25/2015     Chemistry      Component Value Date/Time   NA 135 08/04/2015 0903   K 4.2 08/04/2015 0903   CL 101 08/04/2015 0903   CO2 28 08/04/2015 0903   BUN 25 (H) 08/04/2015 0903   CREATININE 0.8 08/04/2015 0903      Component Value Date/Time   CALCIUM 9.8 08/04/2015 0903   ALKPHOS 108 (H) 08/04/2015 0903   AST 47 (H) 08/04/2015 0903   ALT 24 08/04/2015 0903   BILITOT 0.80 08/04/2015 0903         Impression and Plan: [name] is 83 year old white female with metastatic colon cancer. She's off therapy now. She really had a very difficult time with therapy. Tamara [name] quality of life was not improved.  For now, Tamara [name] quality of life is doing much better. I really believe that the IV fluids that she gets really help Tamara [name] out.  She will not take any further therapy. As such, I do not see that we have to do any scans on Tamara [name]. Even though Tamara [name] CEA is going up, she is totally asymptomatic.  She gets IV fluids on occasion. I think this is all we really need to do with Tamara [name].  I did talk to Tamara [name] about hospice. I brought up that hospice would be a very useful resource packet helped she and Tamara [name] husband mouth. They do not have to do anything right now but when the time comes, and I told of the time will come, that she would benefit from hospice.  She does not want hospice at this point. She is very particular about anybody coming into Tamara [name] home. I tried to reassure Tamara [name] that hospice would only come if necessary. She still wants to hold off.  She really is enjoying a good quality of life. She is able  to take care of Tamara [name] husband who has squamous cell cancer of the skin.  I told Tamara [name] that she always calls back with the need for IV fluids.  We will plan to get Tamara [name] back in one more month.   Volanda Napoleon, MD 7/31/201710:32 AM

## 2015-08-25 NOTE — Patient Instructions (Signed)

## 2015-08-26 LAB — CEA

## 2015-09-12 ENCOUNTER — Ambulatory Visit (HOSPITAL_BASED_OUTPATIENT_CLINIC_OR_DEPARTMENT_OTHER): Payer: Medicare Other

## 2015-09-12 VITALS — BP 157/69 | HR 67 | Temp 97.7°F | Resp 16

## 2015-09-12 DIAGNOSIS — C189 Malignant neoplasm of colon, unspecified: Secondary | ICD-10-CM

## 2015-09-12 DIAGNOSIS — E860 Dehydration: Secondary | ICD-10-CM

## 2015-09-12 MED ORDER — SODIUM CHLORIDE 0.9 % IJ SOLN
10.0000 mL | INTRAMUSCULAR | Status: DC | PRN
  Filled 2015-09-12: qty 10

## 2015-09-12 MED ORDER — SODIUM CHLORIDE 0.9 % IV SOLN: Freq: Once | INTRAVENOUS | Status: AC

## 2015-09-12 MED ORDER — HEPARIN SOD (PORK) LOCK FLUSH 100 UNIT/ML IV SOLN
500.0000 [IU] | Freq: Once | INTRAVENOUS | Status: AC | PRN
  Administered 2015-09-12: 500 [IU]
  Filled 2015-09-12: qty 5

## 2015-09-12 MED ORDER — SODIUM CHLORIDE 0.9 % IV SOLN
Freq: Once | INTRAVENOUS | Status: AC
  Administered 2015-09-12: 15:00:00 via INTRAVENOUS

## 2015-09-12 NOTE — Patient Instructions (Signed)

## 2015-09-15 ENCOUNTER — Encounter: Payer: Self-pay | Admitting: Nurse Practitioner

## 2015-09-15 ENCOUNTER — Other Ambulatory Visit: Payer: Self-pay | Admitting: Hematology & Oncology

## 2015-09-15 ENCOUNTER — Other Ambulatory Visit: Payer: Self-pay | Admitting: Nurse Practitioner

## 2015-09-15 DIAGNOSIS — C189 Malignant neoplasm of colon, unspecified: Secondary | ICD-10-CM

## 2015-09-15 DIAGNOSIS — C187 Malignant neoplasm of sigmoid colon: Secondary | ICD-10-CM

## 2015-09-15 NOTE — Progress Notes (Signed)
Pt called and stated she was having some vaginal bleed. She stated she does not have a GYN and was unsure if it was vaginal or not but pretty sure. Denies any headache or dizziness. No other discharge. Per Dr. Marin Olp patient will come in tomorrow to be seen and have CT scan and be evaluated. Pt verbalized understanding and appreciation.

## 2015-09-16 ENCOUNTER — Encounter: Payer: Self-pay | Admitting: Family

## 2015-09-16 ENCOUNTER — Ambulatory Visit (HOSPITAL_BASED_OUTPATIENT_CLINIC_OR_DEPARTMENT_OTHER): Payer: Medicare Other

## 2015-09-16 ENCOUNTER — Ambulatory Visit (HOSPITAL_BASED_OUTPATIENT_CLINIC_OR_DEPARTMENT_OTHER)
Admission: RE | Admit: 2015-09-16 | Discharge: 2015-09-16 | Disposition: A | Discharge status: Home / Self Care | Payer: Medicare Other | Source: Ambulatory Visit | Attending: Hematology & Oncology | Admitting: Hematology & Oncology

## 2015-09-16 ENCOUNTER — Other Ambulatory Visit (HOSPITAL_BASED_OUTPATIENT_CLINIC_OR_DEPARTMENT_OTHER): Payer: Medicare Other

## 2015-09-16 ENCOUNTER — Encounter (HOSPITAL_BASED_OUTPATIENT_CLINIC_OR_DEPARTMENT_OTHER): Payer: Self-pay

## 2015-09-16 ENCOUNTER — Ambulatory Visit (HOSPITAL_BASED_OUTPATIENT_CLINIC_OR_DEPARTMENT_OTHER): Payer: Medicare Other | Admitting: Family

## 2015-09-16 VITALS — BP 158/73 | HR 87 | Temp 97.3°F | Resp 18 | Ht 66.0 in | Wt 125.0 lb

## 2015-09-16 DIAGNOSIS — J984 Other disorders of lung: Secondary | ICD-10-CM | POA: Diagnosis not present

## 2015-09-16 DIAGNOSIS — C187 Malignant neoplasm of sigmoid colon: Secondary | ICD-10-CM

## 2015-09-16 DIAGNOSIS — C189 Malignant neoplasm of colon, unspecified: Secondary | ICD-10-CM

## 2015-09-16 DIAGNOSIS — K769 Liver disease, unspecified: Secondary | ICD-10-CM | POA: Insufficient documentation

## 2015-09-16 DIAGNOSIS — N858 Other specified noninflammatory disorders of uterus: Secondary | ICD-10-CM | POA: Insufficient documentation

## 2015-09-16 DIAGNOSIS — E860 Dehydration: Secondary | ICD-10-CM

## 2015-09-16 DIAGNOSIS — N939 Abnormal uterine and vaginal bleeding, unspecified: Secondary | ICD-10-CM

## 2015-09-16 LAB — CBC WITH DIFFERENTIAL (CANCER CENTER ONLY)
BASO#: 0 10*3/uL (ref 0.0–0.2)
BASO%: 0.3 % (ref 0.0–2.0)
EOS%: 1.7 % (ref 0.0–7.0)
Eosinophils Absolute: 0.1 10*3/uL (ref 0.0–0.5)
HCT: 32.9 % — ABNORMAL LOW (ref 34.8–46.6)
HGB: 11.5 g/dL — ABNORMAL LOW (ref 11.6–15.9)
LYMPH#: 1 10*3/uL (ref 0.9–3.3)
LYMPH%: 16.3 % (ref 14.0–48.0)
MCH: 36.4 pg — ABNORMAL HIGH (ref 26.0–34.0)
MCHC: 35 g/dL (ref 32.0–36.0)
MCV: 104 fL — ABNORMAL HIGH (ref 81–101)
MONO#: 0.8 10*3/uL (ref 0.1–0.9)
MONO%: 13.1 % — AB (ref 0.0–13.0)
NEUT#: 4 10*3/uL (ref 1.5–6.5)
NEUT%: 68.6 % (ref 39.6–80.0)
Platelets: 94 10*3/uL — ABNORMAL LOW (ref 145–400)
RBC: 3.16 10*6/uL — AB (ref 3.70–5.32)
RDW: 12.6 % (ref 11.1–15.7)
WBC: 5.9 10*3/uL (ref 3.9–10.0)

## 2015-09-16 LAB — CMP (CANCER CENTER ONLY)
ALBUMIN: 3.6 g/dL (ref 3.3–5.5)
ALK PHOS: 120 U/L — AB (ref 26–84)
ALT: 23 U/L (ref 10–47)
AST: 63 U/L — AB (ref 11–38)
BILIRUBIN TOTAL: 1 mg/dL (ref 0.20–1.60)
BUN, Bld: 23 mg/dL — ABNORMAL HIGH (ref 7–22)
CALCIUM: 10.1 mg/dL (ref 8.0–10.3)
CO2: 29 meq/L (ref 18–33)
CREATININE: 1.2 mg/dL (ref 0.6–1.2)
Chloride: 101 mEq/L (ref 98–108)
GLUCOSE: 94 mg/dL (ref 73–118)
Potassium: 4.5 mEq/L (ref 3.3–4.7)
SODIUM: 133 meq/L (ref 128–145)
Total Protein: 7.3 g/dL (ref 6.4–8.1)

## 2015-09-16 MED ORDER — HEPARIN SOD (PORK) LOCK FLUSH 100 UNIT/ML IV SOLN
500.0000 [IU] | Freq: Once | INTRAVENOUS | Status: AC | PRN
  Administered 2015-09-16: 500 [IU]
  Filled 2015-09-16: qty 5

## 2015-09-16 MED ORDER — SODIUM CHLORIDE 0.9 % IV SOLN
Freq: Once | INTRAVENOUS | Status: AC
  Administered 2015-09-16: 10:00:00 via INTRAVENOUS

## 2015-09-16 MED ORDER — IOPAMIDOL (ISOVUE-300) INJECTION 61%
100.0000 mL | Freq: Once | INTRAVENOUS | Status: AC | PRN
  Administered 2015-09-16: 80 mL via INTRAVENOUS

## 2015-09-16 MED ORDER — SODIUM CHLORIDE 0.9 % IJ SOLN
10.0000 mL | INTRAMUSCULAR | Status: DC | PRN
  Administered 2015-09-16: 10 mL
  Filled 2015-09-16: qty 10

## 2015-09-16 NOTE — Patient Instructions (Signed)

## 2015-09-16 NOTE — Progress Notes (Signed)
Hematology and Oncology Follow Up Visit  Tamara [name] [id number] [birth year] 82 y.o. 09/16/2015   Principle Diagnosis:  Recurrent/metastatic colon cancer - progressive   Current Therapy:   Status post palliative radiation therapy to the pelvis for rectal bleeding IV fluids as indicated    Interim History:  Tamara [name] is here today with complaint of vaginal spotting. She had some blood on the toilet tissue after urinating earlier this week. Tamara [name] Hgb is stable at 11.5 and an MCV of 104. Tamara [name] platelet count is 94. She has had no other episodes of bleeding and no bruising or petechiae.  Tamara [name] CEA in July was 1,367. No lymphadenopathy found on exam.  No fever, chills, n/v, cough, rash, dizziness, SOB, chest pain, palpitations (has pacemaker), abdominal pain or changes in bladder habits. Tamara [name] ileostomy continues to function appropriately and she uses lomotil as needed.  No swelling, tenderness, numbness or tingling in Tamara [name] extremities. No c/o joint aches or "bone" pain.  She is still eating 5 small meals a day one of which includes Ensure in the morning and evening. Tamara [name] appetite is a bit down because Tamara [name] taste is "off." She is doing Tamara [name] best to stay hydrated. Tamara [name] weight is stable.   Medications:    Medication List       Accurate as of 09/16/15 10:25 AM. Always use your most recent med list.          antiseptic oral rinse Liqd 15 mLs by Mouth Rinse route 5 (five) times daily. After brushing teeth   b complex vitamins tablet Take 1 tablet by mouth every other day.   BIOTENE DRY MOUTH Pste Place 1 application onto teeth 5 (five) times daily.   Biotin 5 MG Tabs Take 1 tablet by mouth every morning.   cholestyramine light 4 GM/DOSE powder Commonly known as:  PREVALITE TAKE 1/2 SCOOP BY MOUTH BETWEEN MEALS AS NEEDED FOR FREQUENT STOOLS FOR 30 DAYS   diphenoxylate-atropine 2.5-0.025 MG tablet Commonly known as:  LOMOTIL take 2 tablets by mouth four times a day   feeding supplement  (ENSURE COMPLETE) Liqd Take 237 mLs by mouth 2 (two) times daily between meals. *Vanilla*   lidocaine-prilocaine cream Commonly known as:  EMLA Apply 1 application topically as needed. Apply to port site one hour before access   multivitamin with minerals Tabs tablet Take 1 tablet by mouth daily.   SUPER B COMPLEX PO Take 1 tablet by mouth every other day. Alternating days with B complex   Vitamin D 2000 units tablet Take 2,000 Units by mouth daily with breakfast.       Allergies:  Allergies  Allergen Reactions  . Codeine Nausea And Vomiting  . Zofran [Ondansetron Hcl] Nausea And Vomiting  . Ciprofloxacin     Confusion/dizziness/fatigue  . Metronidazole     Confusion/dizziness/fatigue  . Skelaxin [Metaxalone]     Too strong  . Tetracycline Hcl     Not tolerated  . Avelox [Moxifloxacin] Rash    Past Medical History, Surgical history, Social history, and Family History were reviewed and updated.  Review of Systems: All other 10 point review of systems is negative.   Physical Exam:  height is 5\' 6"  (1.676 m) and weight is 125 lb (56.7 kg). Tamara [name] oral temperature is 97.3 F (36.3 C). Tamara [name] blood pressure is 158/73 (abnormal) and Tamara [name] pulse is 87. Tamara [name] respiration is 18.   Wt Readings from Last 3 Encounters:  09/16/15 125 lb (56.7 kg)  08/25/15 125 lb 1.9 oz (56.8  kg)  08/04/15 126 lb 0.6 oz (57.2 kg)    Ocular: Sclerae unicteric, pupils equal, round and reactive to light Ear-nose-throat: Oropharynx clear, dentition fair Lymphatic: No cervical supraclavicular or axillary adenopathy Lungs no rales or rhonchi, good excursion bilaterally Heart regular rate and rhythm, no murmur appreciated Abd soft, nontender, positive bowel sounds, no liver or spleen tip palpated on exam, no fluid wave  MSK no focal spinal tenderness, no joint edema Neuro: non-focal, well-oriented, appropriate affect Breasts: Deferred  Lab Results  Component Value Date   WBC 5.9 09/16/2015   HGB 11.5  (L) 09/16/2015   HCT 32.9 (L) 09/16/2015   MCV 104 (H) 09/16/2015   PLT 94 (L) 09/16/2015   Lab Results  Component Value Date   FERRITIN 34 03/21/2015   IRON 117 03/21/2015   TIBC 348 03/21/2015   UIBC 230 03/21/2015   IRONPCTSAT 34 03/21/2015   Lab Results  Component Value Date   RBC 3.16 (L) 09/16/2015   No results found for: KPAFRELGTCHN, LAMBDASER, KAPLAMBRATIO No results found for: IGGSERUM, IGA, IGMSERUM No results found for: Odetta Pink, SPEI   Chemistry      Component Value Date/Time   NA 133 09/16/2015 0907   K 4.5 09/16/2015 0907   CL 101 09/16/2015 0907   CO2 29 09/16/2015 0907   BUN 23 (H) 09/16/2015 0907   CREATININE 1.2 09/16/2015 0907      Component Value Date/Time   CALCIUM 10.1 09/16/2015 0907   ALKPHOS 120 (H) 09/16/2015 0907   AST 63 (H) 09/16/2015 0907   ALT 23 09/16/2015 0907   BILITOT 1.00 09/16/2015 [id number]     Impression and Plan: Ms. Lazzara is 82 yo white female with metastatic colon cancer. She is now off of therapy and we are providing supportive care. She is here today with c/o vaginal spotting of blood. Tamara [name] CBC counts are stable and she has had no other episodes of bleeding. No c/o abdominal pain.  We will give Tamara [name] fluids today per Tamara [name] request and also do a CT of the abdomen and pelvis later today. She is currently drinking Tamara [name] contrast.  We will see if this scan shows any new progression of disease and then determine further follow-up and plan of care.  She will contact our office with any questions or concerns. We can see Tamara [name] in sooner if need be.   Eliezer Bottom, NP 8/22/201710:25 AM

## 2015-09-18 ENCOUNTER — Other Ambulatory Visit: Payer: Self-pay

## 2015-09-19 ENCOUNTER — Ambulatory Visit: Payer: Medicare Other

## 2015-09-19 ENCOUNTER — Ambulatory Visit: Payer: Medicare Other | Admitting: Radiation Oncology

## 2015-09-19 ENCOUNTER — Telehealth: Payer: Self-pay

## 2015-09-19 ENCOUNTER — Encounter (HOSPITAL_COMMUNITY): Payer: Self-pay | Admitting: *Deleted

## 2015-09-19 ENCOUNTER — Ambulatory Visit: Payer: Self-pay | Admitting: Radiation Oncology

## 2015-09-19 ENCOUNTER — Observation Stay (HOSPITAL_COMMUNITY)
Admission: EM | Admit: 2015-09-19 | Discharge: 2015-09-22 | Disposition: A | Discharge status: Home / Self Care | Payer: Medicare Other | Attending: Internal Medicine | Admitting: Internal Medicine

## 2015-09-19 DIAGNOSIS — Z66 Do not resuscitate: Secondary | ICD-10-CM | POA: Diagnosis not present

## 2015-09-19 DIAGNOSIS — Z950 Presence of cardiac pacemaker: Secondary | ICD-10-CM | POA: Diagnosis present

## 2015-09-19 DIAGNOSIS — Z932 Ileostomy status: Secondary | ICD-10-CM

## 2015-09-19 DIAGNOSIS — C785 Secondary malignant neoplasm of large intestine and rectum: Secondary | ICD-10-CM | POA: Diagnosis not present

## 2015-09-19 DIAGNOSIS — Z515 Encounter for palliative care: Secondary | ICD-10-CM | POA: Diagnosis not present

## 2015-09-19 DIAGNOSIS — I2510 Atherosclerotic heart disease of native coronary artery without angina pectoris: Secondary | ICD-10-CM | POA: Insufficient documentation

## 2015-09-19 DIAGNOSIS — D62 Acute posthemorrhagic anemia: Secondary | ICD-10-CM | POA: Diagnosis present

## 2015-09-19 DIAGNOSIS — Z9049 Acquired absence of other specified parts of digestive tract: Secondary | ICD-10-CM | POA: Insufficient documentation

## 2015-09-19 DIAGNOSIS — N939 Abnormal uterine and vaginal bleeding, unspecified: Principal | ICD-10-CM | POA: Diagnosis present

## 2015-09-19 DIAGNOSIS — N858 Other specified noninflammatory disorders of uterus: Secondary | ICD-10-CM

## 2015-09-19 DIAGNOSIS — Z79899 Other long term (current) drug therapy: Secondary | ICD-10-CM | POA: Insufficient documentation

## 2015-09-19 DIAGNOSIS — E860 Dehydration: Secondary | ICD-10-CM | POA: Insufficient documentation

## 2015-09-19 DIAGNOSIS — D696 Thrombocytopenia, unspecified: Secondary | ICD-10-CM | POA: Insufficient documentation

## 2015-09-19 DIAGNOSIS — D638 Anemia in other chronic diseases classified elsewhere: Secondary | ICD-10-CM | POA: Insufficient documentation

## 2015-09-19 DIAGNOSIS — Z923 Personal history of irradiation: Secondary | ICD-10-CM | POA: Diagnosis not present

## 2015-09-19 DIAGNOSIS — C787 Secondary malignant neoplasm of liver and intrahepatic bile duct: Secondary | ICD-10-CM | POA: Insufficient documentation

## 2015-09-19 DIAGNOSIS — I10 Essential (primary) hypertension: Secondary | ICD-10-CM | POA: Diagnosis present

## 2015-09-19 DIAGNOSIS — C799 Secondary malignant neoplasm of unspecified site: Secondary | ICD-10-CM | POA: Insufficient documentation

## 2015-09-19 DIAGNOSIS — C189 Malignant neoplasm of colon, unspecified: Secondary | ICD-10-CM | POA: Diagnosis not present

## 2015-09-19 DIAGNOSIS — C7800 Secondary malignant neoplasm of unspecified lung: Secondary | ICD-10-CM | POA: Diagnosis not present

## 2015-09-19 DIAGNOSIS — C7989 Secondary malignant neoplasm of other specified sites: Secondary | ICD-10-CM | POA: Diagnosis not present

## 2015-09-19 LAB — BASIC METABOLIC PANEL
Anion gap: 8 (ref 5–15)
BUN: 24 mg/dL — AB (ref 6–20)
CALCIUM: 10.5 mg/dL — AB (ref 8.9–10.3)
CO2: 24 mmol/L (ref 22–32)
CREATININE: 0.86 mg/dL (ref 0.44–1.00)
Chloride: 104 mmol/L (ref 101–111)
GFR calc non Af Amer: >60 mL/min (ref >60)
GLUCOSE: 94 mg/dL (ref 65–99)
Potassium: 4.1 mmol/L (ref 3.5–5.1)
Sodium: 136 mmol/L (ref 135–145)

## 2015-09-19 LAB — CBC WITH DIFFERENTIAL/PLATELET
BASOS PCT: 0 %
Basophils Absolute: 0 10*3/uL (ref 0.0–0.1)
Eosinophils Absolute: 0.1 10*3/uL (ref 0.0–0.7)
Eosinophils Relative: 1 %
HEMATOCRIT: 31.7 % — AB (ref 36.0–46.0)
Hemoglobin: 11.1 g/dL — ABNORMAL LOW (ref 12.0–15.0)
Lymphocytes Relative: 20 %
Lymphs Abs: 1.2 10*3/uL (ref 0.7–4.0)
MCH: 35.9 pg — AB (ref 26.0–34.0)
MCHC: 35 g/dL (ref 30.0–36.0)
MCV: 102.6 fL — ABNORMAL HIGH (ref 78.0–100.0)
MONO ABS: 0.6 10*3/uL (ref 0.1–1.0)
MONOS PCT: 11 %
NEUTROS ABS: 4 10*3/uL (ref 1.7–7.7)
Neutrophils Relative %: 68 %
Platelets: 122 10*3/uL — ABNORMAL LOW (ref 150–400)
RBC: 3.09 MIL/uL — ABNORMAL LOW (ref 3.87–5.11)
RDW: 13.3 % (ref 11.5–15.5)
WBC: 5.9 10*3/uL (ref 4.0–10.5)

## 2015-09-19 LAB — TYPE AND SCREEN
ABO/RH(D): A POS
ANTIBODY SCREEN: NEGATIVE

## 2015-09-19 LAB — GLUCOSE, CAPILLARY: Glucose-Capillary: 126 mg/dL — ABNORMAL HIGH (ref 65–99)

## 2015-09-19 MED ORDER — LIDOCAINE-PRILOCAINE 2.5-2.5 % EX CREA
1.0000 "application " | TOPICAL_CREAM | CUTANEOUS | Status: DC | PRN
  Filled 2015-09-19: qty 5

## 2015-09-19 MED ORDER — ADULT MULTIVITAMIN W/MINERALS CH
1.0000 | ORAL_TABLET | Freq: Every day | ORAL | Status: DC
  Administered 2015-09-20 – 2015-09-22 (×3): 1 via ORAL
  Filled 2015-09-19 (×3): qty 1

## 2015-09-19 MED ORDER — ENSURE ENLIVE PO LIQD
237.0000 mL | Freq: Two times a day (BID) | ORAL | Status: DC
  Administered 2015-09-20 – 2015-09-21 (×3): 237 mL via ORAL

## 2015-09-19 MED ORDER — VITAMIN D3 25 MCG (1000 UNIT) PO TABS
2000.0000 [IU] | ORAL_TABLET | Freq: Every day | ORAL | Status: DC
  Administered 2015-09-20 – 2015-09-22 (×3): 2000 [IU] via ORAL
  Filled 2015-09-19 (×3): qty 2

## 2015-09-19 MED ORDER — MEGESTROL ACETATE 40 MG PO TABS
40.0000 mg | ORAL_TABLET | Freq: Every day | ORAL | Status: DC
  Administered 2015-09-19 – 2015-09-22 (×4): 40 mg via ORAL
  Filled 2015-09-19 (×4): qty 1

## 2015-09-19 MED ORDER — PROCHLORPERAZINE EDISYLATE 5 MG/ML IJ SOLN
5.0000 mg | Freq: Four times a day (QID) | INTRAMUSCULAR | Status: DC | PRN
  Administered 2015-09-19 – 2015-09-22 (×2): 5 mg via INTRAVENOUS
  Filled 2015-09-19 (×2): qty 2

## 2015-09-19 MED ORDER — SODIUM CHLORIDE 0.9% FLUSH
3.0000 mL | Freq: Two times a day (BID) | INTRAVENOUS | Status: DC
  Administered 2015-09-21: 3 mL via INTRAVENOUS

## 2015-09-19 MED ORDER — DIPHENOXYLATE-ATROPINE 2.5-0.025 MG PO TABS
2.0000 | ORAL_TABLET | Freq: Four times a day (QID) | ORAL | Status: DC
  Administered 2015-09-20 – 2015-09-22 (×10): 2 via ORAL
  Filled 2015-09-19 (×11): qty 2

## 2015-09-19 MED ORDER — BIOTIN 5 MG PO TABS: 1.0000 | ORAL_TABLET | Freq: Every morning | ORAL | Status: DC

## 2015-09-19 NOTE — ED Triage Notes (Signed)
Pt complains of vaginal bleeding since yesterday. Pt states she believes she has lost at least a pint of blood. Pt states the blood was bright red yesterday and is now coming out in clots. Pt has colostomy bag, states she has had worsening  diarrhea since this morning.

## 2015-09-19 NOTE — Telephone Encounter (Signed)
Received VM from pt stating she is having "a lot, a lot, a lot of vaginal bleeding and I don't know what to do."  Returned pt's call. Pt is very anxious and upset. Per Dr Marin Olp, the treatment for this bleeding is radiation oncology. Pt is upset that she can not be seen by Dr Sondra Come until Thursday next week. States "I have never seen anything so inefficient in the medical field."  Aware that Dr Marin Olp has spoken with Dr Sondra Come, but also that Dr Sondra Come is not working today. States multiple times, "I guess you people just want me to stand here and bleed to death." Encouraged patient to go to ED if she is loosing that much blood. States "I just don't understand why you don't do something." While on the phone with pt, Dr Marin Olp contacted Dr Lisbeth Renshaw in rad onc. He will attempt to work in patient today. Pt aware of this information and states "I can't just stand here bleeding and wait for a call." Reminded pt that radiation is the treatment to correct the bleeding. Pt continues to repeat how upset she is and how "inefficient this all is." Attempted to calm patient and questioned how to better help her. Pt states "I will give them 30 minutes then I'm going to the ER. I won't just stand here and bleed to death."

## 2015-09-19 NOTE — ED Notes (Signed)
Pt has been informed about Urinalysis. Continues to contaminate specimen.

## 2015-09-19 NOTE — Progress Notes (Signed)
PHARMACIST - PHYSICIAN ORDER COMMUNICATION  CONCERNING: P&T Medication Policy on Herbal Medications  DESCRIPTION:  This patient's order for:  biotin  has been noted.  This product(s) is classified as an "herbal" or natural product. Due to a lack of definitive safety studies or FDA approval, nonstandard manufacturing practices, plus the potential risk of unknown drug-drug interactions while on inpatient medications, the Pharmacy and Therapeutics Committee does not permit the use of "herbal" or natural products of this type within [facility].   ACTION TAKEN: The pharmacy department is unable to verify this order at this time and has been discontinued.   Please reevaluate patient's clinical condition at discharge and address if the herbal or natural product(s) should be resumed at that time.  Royetta Asal, PharmD, BCPS Pager [phone] 09/19/2015 5:53 PM

## 2015-09-19 NOTE — ED Provider Notes (Signed)
[facility] DEPT Provider Note   CSN: [id number] Arrival date & time: 09/19/15  1131     History   Chief Complaint Chief Complaint  Patient presents with  . Vaginal Bleeding    HPI Tamara [name] is a 82 y.o. female.  HPI  82 year old female who presents with vaginal bleeding. No blood thinners. Had spotting from vagina Earlier this week, but had vaginal bleeding starting yesterday of bright red blood with clots. She may have passed a pint of blood out of her vagina. She has a history of breast cancer status post lumpectomy and radiation therapy remotely. Also history of colon cancer status post partial colectomy with colostomy, not undergoing active treatment. No bloody output through her colostomy, having some diarrhea. No abdominal pain, syncope, chest pain or difficulty breathing. No dysuria or urinary frequency. Has not had vaginal bleeding before in the past postmenopausal.  Past Medical History:  Diagnosis Date  . Breast cancer ([place])   . Colitis   . Colon cancer ([place]) 02/21/2013  . HTN (hypertension) August 2014  . Radiation 01/15/15-02/05/15   pelvis/Hartmann's pouch area  . Sinus arrest 08/28/2012  . Syncope and collapse August 2014   sinus pauses/ PTVDP    Patient Active Problem List   Diagnosis Date Noted  . Dehydration 08/25/2015  . Malignant neoplasm of colon ([place])   . Rectal bleeding 01/06/2015  . Lower GI bleeding 01/06/2015  . GI bleed 01/06/2015  . Colon cancer metastasized to multiple sites [facility])   . Ileostomy in place [facility]) 01/05/2015  . FOM (frequency of micturition) 07/19/2014  . Acute pharyngitis 07/19/2014  . Pain in thoracic spine 07/19/2014  . Breast cancer, female ([place]) 07/19/2014  . LBP (low back pain) 07/19/2014  . Dizziness and giddiness 07/19/2014  . Internal hemorrhoids without complication [id number]  . Accelerated essential hypertension 07/19/2014  . Pain in pelvis 07/19/2014  . Anemia due to blood loss, chronic 05/23/2014  .  Viral gastroenteritis 04/15/2014  . Acute posthemorrhagic anemia 03/12/2013  . Metabolic acidosis [id number]  . Hyperkalemia 03/05/2013  . Acute kidney injury ([place]) 03/05/2013  . Protein calorie malnutrition ([place]) 03/05/2013  . UTI (urinary tract infection) 03/05/2013  . Colon cancer ([place]) 02/21/2013  . Hyponatremia 02/21/2013  . Coronary atherosclerosis of native coronary artery 02/15/2013  . Depressive disorder, not elsewhere classified 02/15/2013  . Essential hypertension, benign 11/27/2012  . Nausea and vomiting 11/26/2012  . LLQ abdominal pain 11/26/2012  . Diastolic dysfunction- 2 [id number]  . Pacemaker implanted 08/28/12 08/30/2012  . Elevated blood pressure 08/30/2012    Past Surgical History:  Procedure Laterality Date  . BOWEL DECOMPRESSION N/A 12/15/2012   Procedure: BOWEL DECOMPRESSION;  Surgeon: Jeryl Columbia, MD;  Location: [facility] ENDOSCOPY;  Service: Endoscopy;  Laterality: N/A;  . BREAST LUMPECTOMY    . CHOLECYSTECTOMY    . COLON RESECTION N/A 12/22/2012   Procedure: EXPLORATORY LAPAROTOMY , RIGHT Colectomy IILEOSTOMY;  Surgeon: Rolm Bookbinder, MD;  Location: [facility];  Service: General;  Laterality: N/A;  . COLONOSCOPY N/A 12/15/2012   Procedure: COLONOSCOPY;  Surgeon: Jeryl Columbia, MD;  Location: [facility] ENDOSCOPY;  Service: Endoscopy;  Laterality: N/A;  . FLEXIBLE SIGMOIDOSCOPY N/A 12/06/2012   Procedure: FLEXIBLE SIGMOIDOSCOPY;  Surgeon: Lear Ng, MD;  Location: [facility] ENDOSCOPY;  Service: Endoscopy;  Laterality: N/A;  . FLEXIBLE SIGMOIDOSCOPY N/A 01/06/2015   Procedure: FLEXIBLE SIGMOIDOSCOPY;  Surgeon: Wilford Corner, MD;  Location: [facility] ENDOSCOPY;  Service: Endoscopy;  Laterality: N/A;  . LAPAROSCOPIC PARTIAL COLECTOMY N/A  12/07/2012   Procedure: LAPAROSCOPIC-ASSISTED TRANSVERSE COLECTOMY;  Surgeon: Joyice Faster. Cornett, MD;  Location: [place];  Service: General;  Laterality: N/A;  . LEFT HEART CATHETERIZATION WITH CORONARY ANGIOGRAM Bilateral 08/26/2012   Procedure:  LEFT HEART CATHETERIZATION WITH CORONARY ANGIOGRAM;  Surgeon: Lorretta Harp, MD;  Location: [facility] CATH LAB;  Service: Cardiovascular;  Laterality: Bilateral;  . PACEMAKER INSERTION  08/28/12   MDT  . PERMANENT PACEMAKER INSERTION N/A 08/28/2012   Procedure: PERMANENT PACEMAKER INSERTION;  Surgeon: Sanda Klein, MD;  Location: [facility] CATH LAB;  Service: Cardiovascular;  Laterality: N/A;  . TONSILLECTOMY      OB History    No data available       Home Medications    Prior to Admission medications   Medication Sig Start Date End Date Taking? Authorizing Provider  antiseptic oral rinse (BIOTENE) LIQD 15 mLs by Mouth Rinse route 5 (five) times daily. After brushing teeth   Yes Historical Provider, MD  b complex vitamins tablet Take 1 tablet by mouth every other day.   Yes Historical Provider, MD  B Complex-C (SUPER B COMPLEX PO) Take 1 tablet by mouth every other day. Alternating days with B complex   Yes Historical Provider, MD  Biotin 5 MG TABS Take 1 tablet by mouth every morning.    Yes Historical Provider, MD  Cholecalciferol (VITAMIN D) 2000 UNITS tablet Take 2,000 Units by mouth daily with breakfast.   Yes Historical Provider, MD  Dentifrices (SENSODYNE) PSTE Place 1 application onto teeth 5 (five) times daily.   Yes Historical Provider, MD  diphenoxylate-atropine (LOMOTIL) 2.5-0.025 MG tablet take 2 tablets by mouth four times a day 09/15/15  Yes Volanda Napoleon, MD  feeding supplement, ENSURE COMPLETE, (ENSURE COMPLETE) LIQD Take 237 mLs by mouth 2 (two) times daily between meals. [name]* 01/01/13  Yes Josetta Huddle, MD  lidocaine-prilocaine (EMLA) cream Apply 1 application topically as needed. Apply to port site one hour before access 05/13/14  Yes Volanda Napoleon, MD  Multiple Vitamin (MULTIVITAMIN WITH MINERALS) TABS tablet Take 1 tablet by mouth daily with breakfast.    Yes Historical Provider, MD    Family History Family History  Problem Relation Age of Onset  . Alzheimer's disease  Mother   . Renal Disease Father   . Coronary artery disease Brother     Social History Social History  Substance Use Topics  . Smoking status: Never Smoker  . Smokeless tobacco: Never Used     Comment: never used tobacco  . Alcohol use No     Allergies   Codeine; Zofran [ondansetron hcl]; Ciprofloxacin; Metronidazole; Skelaxin [metaxalone]; Tetracycline hcl; and Avelox [moxifloxacin]   Review of Systems Review of Systems 10/14 systems reviewed and are negative other than those stated in the HPI   Physical Exam Updated Vital Signs BP 168/76 (BP Location: Right Arm)   Pulse 90   Temp 98.2 F (36.8 C) (Oral)   Resp 20   Ht 5' 5.5" (1.664 m)   Wt 125 lb (56.7 kg)   SpO2 98%   BMI 20.48 kg/m   Physical Exam Physical Exam  Nursing note and vitals reviewed. Constitutional: Well developed, well nourished, non-toxic, and in no acute distress Head: Normocephalic and atraumatic.  Mouth/Throat: Oropharynx is clear and moist.  Neck: Normal range of motion. Neck supple.  Cardiovascular: Normal rate and regular rhythm.   Pulmonary/Chest: Effort normal and breath sounds normal.  Abdominal: Soft. Colostomy in RLQ w/ loose non bloody stool. There is no tenderness.  There is no rebound and no guarding.  Pelvic: no vaginal laceration, poor visualization of the cervix due to poor cooperation, no active gushing of blood Musculoskeletal: Normal range of motion.  Neurological: Alert, no facial droop, fluent speech, moves all extremities symmetrically Skin: Skin is warm and dry.  Psychiatric: Cooperative   ED Treatments / Results  Labs (all labs ordered are listed, but only abnormal results are displayed) Labs Reviewed  CBC WITH DIFFERENTIAL/PLATELET - Abnormal; Notable for the following:       Result Value   RBC 3.09 (*)    Hemoglobin 11.1 (*)    HCT 31.7 (*)    MCV 102.6 (*)    MCH 35.9 (*)    Platelets 122 (*)    All other components within normal limits  BASIC METABOLIC  PANEL - Abnormal; Notable for the following:    BUN 24 (*)    Calcium 10.5 (*)    All other components within normal limits  URINALYSIS, ROUTINE W REFLEX MICROSCOPIC (NOT AT [facility])  TYPE AND SCREEN    EKG  EKG Interpretation None       Radiology No results found.  Procedures Procedures (including critical care time)  Medications Ordered in ED Medications  megestrol (MEGACE) tablet 40 mg (40 mg Oral Given 09/19/15 1516)     Initial Impression / Assessment and Plan / ED Course  I have reviewed the triage vital signs and the nursing notes.  Pertinent labs & imaging results that were available during my care of the patient were reviewed by me and considered in my medical decision making (see chart for details).  Clinical Course   82 year old female who presents with vaginal bleeding. It appears on recent imaging from 09/16/2015 patient was noted to have progressive metastatic disease within her abdomen and pelvis including a uterine tumor. This is the likely etiology of her bleeding. She is hemodynamically stable. She has stable hemoglobin of around 11, with baseline between 11 and 12. She does have some active bleeding while here in the emergency department of around 100 mL of blood x 2. She is very difficult to get a pelvic exam on, and did not tolerate the procedure. Quick view did not reveal any major vaginal lacerations, but the cervix could not be completely visualized. There was not a huge gushing of blood.  I did speak with Dr. Marin Olp. He had plans to initiate radiation therapy for her lesion, but she does not have initial appointment until Thursday. He does not think that she has a good prognosis for the progression of her disease. Recommended admission for serial hgb, c/s radiation oncology for radiation therapy, and he will see patient as on call physician over the weekend to discuss prognosis and potential transition to hospice if patient desires. Will plan to admit to  hospitalist service  Final Clinical Impressions(s) / ED Diagnoses   Final diagnoses:  Vaginal bleeding  Metastatic colon cancer in female [facility])  Uterine mass    New Prescriptions New Prescriptions   No medications on file     Forde Dandy, MD 09/19/15 1601

## 2015-09-19 NOTE — H&P (Signed)
History and Physical    Tamara [name] [id number] DOB: 12-27-1929 DOA: 09/19/2015  PCP: Henrine Screws, MD  Outpatient Specialists: Dr. Marin Olp, Onc Patient coming from: home  Chief Complaint: Vaginal bleeding  HPI: Tamara [name] is a 81 y.o. female with medical history significant of PPM [employer] 09/07/12 for sinus arrest and bradycardia, HTN, stage II a ductal CA 2000, DCIS R breast 2008, colon cancer stage IIIB [T4 N1 cM0] adenocarcinoma transverse colon status post resection and ostomy, recently restaged with a CT scan on 09/16/2015 which showed progression of disease, multiple new pulmonary lesions and hepatic lesions, new peritoneal implant in the posterior pelvis as well as enlarging mass within the uterus. Patient presents to the emergency room on 8/25 with complaints of vaginal bleeding. She tells me that he has noticed some spotting earlier this week, however yesterday she has noticed large amounts of bright red blood with clots. She has no lightheadedness or dizziness. She denies any fever or chills, denies any chest pain, she has no shortness of breath, and otherwise is feeling at her normal self. She is quite active and is able to complete her ADLs without difficulties. She has never had vaginal bleeding in the past. She denies any burning with urination or increased frequency. She denies any blood in her urine or in her stools.  ED Course: In the emergency room her vital signs are stable, her BMP is unremarkable, her CBC shows a hemoglobin of 11.1, and that is similar to a CBC done 3 days ago when he was 11.5. Her platelets are decreased at 122. EDP discussed with OB/GYN who recommended Megace, however with little chance of working in a case of metastatic disease. EDP also discussed with Dr. Marin Olp who will see patient tomorrow in consultation.  Review of Systems: As per HPI otherwise 10 point review of systems negative.   Past Medical History:  Diagnosis Date  . Breast  cancer ([place])   . Colitis   . Colon cancer ([place]) 02/21/2013  . HTN (hypertension) August 2014  . Radiation 01/15/15-02/05/15   pelvis/Hartmann's pouch area  . Sinus arrest 08/28/2012  . Syncope and collapse August 2014   sinus pauses/ PTVDP    Past Surgical History:  Procedure Laterality Date  . BOWEL DECOMPRESSION N/A 12/15/2012   Procedure: BOWEL DECOMPRESSION;  Surgeon: Jeryl Columbia, MD;  Location: [facility] ENDOSCOPY;  Service: Endoscopy;  Laterality: N/A;  . BREAST LUMPECTOMY    . CHOLECYSTECTOMY    . COLON RESECTION N/A 12/22/2012   Procedure: EXPLORATORY LAPAROTOMY , RIGHT Colectomy IILEOSTOMY;  Surgeon: Rolm Bookbinder, MD;  Location: [facility];  Service: General;  Laterality: N/A;  . COLONOSCOPY N/A 12/15/2012   Procedure: COLONOSCOPY;  Surgeon: Jeryl Columbia, MD;  Location: [facility] ENDOSCOPY;  Service: Endoscopy;  Laterality: N/A;  . FLEXIBLE SIGMOIDOSCOPY N/A 12/06/2012   Procedure: FLEXIBLE SIGMOIDOSCOPY;  Surgeon: Lear Ng, MD;  Location: [facility] ENDOSCOPY;  Service: Endoscopy;  Laterality: N/A;  . FLEXIBLE SIGMOIDOSCOPY N/A 01/06/2015   Procedure: FLEXIBLE SIGMOIDOSCOPY;  Surgeon: Wilford Corner, MD;  Location: [facility] ENDOSCOPY;  Service: Endoscopy;  Laterality: N/A;  . LAPAROSCOPIC PARTIAL COLECTOMY N/A 12/07/2012   Procedure: LAPAROSCOPIC-ASSISTED TRANSVERSE COLECTOMY;  Surgeon: Joyice Faster. Cornett, MD;  Location: [place];  Service: General;  Laterality: N/A;  . LEFT HEART CATHETERIZATION WITH CORONARY ANGIOGRAM Bilateral 08/26/2012   Procedure: LEFT HEART CATHETERIZATION WITH CORONARY ANGIOGRAM;  Surgeon: Lorretta Harp, MD;  Location: [facility] CATH LAB;  Service: Cardiovascular;  Laterality: Bilateral;  . PACEMAKER INSERTION  08/28/12   MDT  . PERMANENT PACEMAKER INSERTION N/A 08/28/2012   Procedure: PERMANENT PACEMAKER INSERTION;  Surgeon: Sanda Klein, MD;  Location: [facility] CATH LAB;  Service: Cardiovascular;  Laterality: N/A;  . TONSILLECTOMY       reports that she has never smoked. She has  never used smokeless tobacco. She reports that she does not drink alcohol or use drugs.  Allergies  Allergen Reactions  . Codeine Nausea And Vomiting  . Zofran [Ondansetron Hcl] Nausea And Vomiting  . Ciprofloxacin     Confusion/dizziness/fatigue  . Metronidazole     Confusion/dizziness/fatigue  . Skelaxin [Metaxalone]     Too strong  . Tetracycline Hcl     Not tolerated  . Avelox [Moxifloxacin] Rash    Family History  Problem Relation Age of Onset  . Alzheimer's disease Mother   . Renal Disease Father   . Coronary artery disease Brother     Prior to Admission medications   Medication Sig Start Date End Date Taking? Authorizing Provider  antiseptic oral rinse (BIOTENE) LIQD 15 mLs by Mouth Rinse route 5 (five) times daily. After brushing teeth   Yes Historical Provider, MD  b complex vitamins tablet Take 1 tablet by mouth every other day.   Yes Historical Provider, MD  B Complex-C (SUPER B COMPLEX PO) Take 1 tablet by mouth every other day. Alternating days with B complex   Yes Historical Provider, MD  Biotin 5 MG TABS Take 1 tablet by mouth every morning.    Yes Historical Provider, MD  Cholecalciferol (VITAMIN D) 2000 UNITS tablet Take 2,000 Units by mouth daily with breakfast.   Yes Historical Provider, MD  Dentifrices (SENSODYNE) PSTE Place 1 application onto teeth 5 (five) times daily.   Yes Historical Provider, MD  diphenoxylate-atropine (LOMOTIL) 2.5-0.025 MG tablet take 2 tablets by mouth four times a day 09/15/15  Yes Volanda Napoleon, MD  feeding supplement, ENSURE COMPLETE, (ENSURE COMPLETE) LIQD Take 237 mLs by mouth 2 (two) times daily between meals. [name]* 01/01/13  Yes Josetta Huddle, MD  lidocaine-prilocaine (EMLA) cream Apply 1 application topically as needed. Apply to port site one hour before access 05/13/14  Yes Volanda Napoleon, MD  Multiple Vitamin (MULTIVITAMIN WITH MINERALS) TABS tablet Take 1 tablet by mouth daily with breakfast.    Yes Historical Provider, MD      Physical Exam: Vitals:   09/19/15 1353 09/19/15 1430 09/19/15 1432 09/19/15 1519  BP: 194/89 145/99 145/99 168/76  Pulse: 106 69 69 90  Resp: 20  20 20   Temp:      TempSrc:      SpO2: 100% 95% 95% 98%  Weight:      Height:          Constitutional: NAD, calm, comfortable, pleasant  Vitals:   09/19/15 1353 09/19/15 1430 09/19/15 1432 09/19/15 1519  BP: 194/89 145/99 145/99 168/76  Pulse: 106 69 69 90  Resp: 20  20 20   Temp:      TempSrc:      SpO2: 100% 95% 95% 98%  Weight:      Height:       Eyes: PERRL ENMT: Mucous membranes are moist. Neck: normal, supple Respiratory: clear to auscultation bilaterally, no wheezing, no crackles. Normal respiratory effort. No accessory muscle use.  Cardiovascular: Regular rate and rhythm, no murmurs / rubs / gallops. No extremity edema. 2+ pedal pulses.  Abdomen: no tenderness, no masses palpated. Bowel sounds positive. Ostomy bag in place  Musculoskeletal: no clubbing /  cyanosis. Normal muscle tone.  Skin: no rashes, lesions, ulcers. No induration Neurologic: nonfocal, strength 5 out of 5 in all 4 extremities  Psychiatric: Normal judgment and insight. Alert and oriented x 3. Normal mood.   Labs on Admission: I have personally reviewed following labs and imaging studies  CBC:  Recent Labs Lab 09/16/15 0907 09/19/15 1323  WBC 5.9 5.9  NEUTROABS 4.0 4.0  HGB 11.5* 11.1*  HCT 32.9* 31.7*  MCV 104* 102.6*  PLT 94* [id number]*   Basic Metabolic Panel:  Recent Labs Lab 09/16/15 0907 09/19/15 1323  NA 133 136  K 4.5 4.1  CL 101 104  CO2 29 24  GLUCOSE 94 94  BUN 23* 24*  CREATININE 1.2 0.86  CALCIUM 10.1 10.5*   GFR: Estimated Creatinine Clearance: 43.6 mL/min (by C-G formula based on SCr of 0.86 mg/dL). Liver Function Tests:  Recent Labs Lab 09/16/15 0907  AST 63*  ALT 23  ALKPHOS 120*  BILITOT 1.00  PROT 7.3  ALBUMIN 3.6   No results for input(s): LIPASE, AMYLASE in the last 168 hours. No results for  input(s): AMMONIA in the last 168 hours. Coagulation Profile: No results for input(s): INR, PROTIME in the last 168 hours. Cardiac Enzymes: No results for input(s): CKTOTAL, CKMB, CKMBINDEX, TROPONINI in the last 168 hours. BNP (last 3 results) No results for input(s): PROBNP in the last 8760 hours. HbA1C: No results for input(s): HGBA1C in the last 72 hours. CBG: No results for input(s): GLUCAP in the last 168 hours. Lipid Profile: No results for input(s): CHOL, HDL, LDLCALC, TRIG, CHOLHDL, LDLDIRECT in the last 72 hours. Thyroid Function Tests: No results for input(s): TSH, T4TOTAL, FREET4, T3FREE, THYROIDAB in the last 72 hours. Anemia Panel: No results for input(s): VITAMINB12, FOLATE, FERRITIN, TIBC, IRON, RETICCTPCT in the last 72 hours. Urine analysis:    Component Value Date/Time   COLORURINE YELLOW 05/29/2015 2357   APPEARANCEUR CLEAR 05/29/2015 2357   LABSPEC 1.010 05/29/2015 2357   PHURINE 7.0 05/29/2015 2357   GLUCOSEU NEGATIVE 05/29/2015 2357   HGBUR TRACE (A) 05/29/2015 2357   BILIRUBINUR NEGATIVE 05/29/2015 2357   KETONESUR NEGATIVE 05/29/2015 2357   PROTEINUR NEGATIVE 05/29/2015 2357   UROBILINOGEN 0.2 03/05/2013 1843   NITRITE NEGATIVE 05/29/2015 2357   LEUKOCYTESUR NEGATIVE 05/29/2015 2357   Sepsis Labs: @LABRCNTIP (procalcitonin:4,lacticidven:4) )No results found for this or any previous visit (from the past 240 hour(s)).   Radiological Exams on Admission: No results found.  EKG: Independently reviewed. Atrial paced rhythm  Assessment/Plan Active Problems:   Pacemaker implanted 08/28/12   Colon cancer ([place])   Acute posthemorrhagic anemia   Accelerated essential hypertension   Ileostomy in place [facility])   Colon cancer metastasized to multiple sites [facility])   Vaginal bleeding   Vaginal bleeding - Likely due to her progression of metastatic colon cancer, as evidenced by the CT scan that was done 3 days ago - We'll try Megace, and see if that works as  per OB/GYN recommendations - Appreciate Dr. Antonieta Pert input into case - continue to closely monitor CBC and transfuse as needed to keep hemoglobin greater than 7   Thrombocytopenia  - Chronic, stable   Acute blood loss anemia  - Hemoglobin overall stable, 11.5 on 8/22 and now 11.1, without significant change, repeat CBC in the morning   Ileostomy in place  - Continue Lomotil 4 times daily as per home regimen   PPM  - Monitor on telemetry overnight, if no events, telemetry can be discontinued in the  morning  Essential hypertension - Patient has been off of her blood pressure medications for quite some time, she runs into the 150s 160s in her office appointments - monitor   DVT prophylaxis: SCD  Code Status: DNR  Family Communication: no family bedside Disposition Plan: admit to tele Consults called: oncology  Admission status: observation    Marzetta Board, MD [facility] Pager [phone]  If 7PM-7AM, please contact night-coverage [email or web address] Password [email or web address]  09/19/2015, 4:30 PM

## 2015-09-20 DIAGNOSIS — N939 Abnormal uterine and vaginal bleeding, unspecified: Secondary | ICD-10-CM | POA: Diagnosis not present

## 2015-09-20 DIAGNOSIS — D62 Acute posthemorrhagic anemia: Secondary | ICD-10-CM | POA: Diagnosis not present

## 2015-09-20 DIAGNOSIS — C189 Malignant neoplasm of colon, unspecified: Secondary | ICD-10-CM | POA: Diagnosis not present

## 2015-09-20 DIAGNOSIS — D696 Thrombocytopenia, unspecified: Secondary | ICD-10-CM

## 2015-09-20 DIAGNOSIS — R970 Elevated carcinoembryonic antigen [CEA]: Secondary | ICD-10-CM | POA: Diagnosis not present

## 2015-09-20 LAB — URINE MICROSCOPIC-ADD ON

## 2015-09-20 LAB — BASIC METABOLIC PANEL
Anion gap: 5 (ref 5–15)
BUN: 28 mg/dL — ABNORMAL HIGH (ref 6–20)
CHLORIDE: 104 mmol/L (ref 101–111)
CO2: 27 mmol/L (ref 22–32)
CREATININE: 0.84 mg/dL (ref 0.44–1.00)
Calcium: 9.9 mg/dL (ref 8.9–10.3)
GFR calc non Af Amer: >60 mL/min (ref >60)
Glucose, Bld: 98 mg/dL (ref 65–99)
POTASSIUM: 4 mmol/L (ref 3.5–5.1)
SODIUM: 136 mmol/L (ref 135–145)

## 2015-09-20 LAB — URINALYSIS, ROUTINE W REFLEX MICROSCOPIC
BILIRUBIN URINE: NEGATIVE
Glucose, UA: NEGATIVE mg/dL
KETONES UR: NEGATIVE mg/dL
Leukocytes, UA: NEGATIVE
NITRITE: NEGATIVE
PH: 5.5 (ref 5.0–8.0)
PROTEIN: 100 mg/dL — AB
Specific Gravity, Urine: 1.021 (ref 1.005–1.030)

## 2015-09-20 LAB — CBC
HEMATOCRIT: 30 % — AB (ref 36.0–46.0)
HEMOGLOBIN: 10.4 g/dL — AB (ref 12.0–15.0)
MCH: 35.6 pg — AB (ref 26.0–34.0)
MCHC: 34.7 g/dL (ref 30.0–36.0)
MCV: 102.7 fL — ABNORMAL HIGH (ref 78.0–100.0)
Platelets: 98 10*3/uL — ABNORMAL LOW (ref 150–400)
RBC: 2.92 MIL/uL — AB (ref 3.87–5.11)
RDW: 13.3 % (ref 11.5–15.5)
WBC: 4.7 10*3/uL (ref 4.0–10.5)

## 2015-09-20 MED ORDER — B COMPLEX-C PO TABS
1.0000 | ORAL_TABLET | Freq: Every day | ORAL | Status: DC
  Administered 2015-09-20 – 2015-09-22 (×3): 1 via ORAL
  Filled 2015-09-20 (×3): qty 1

## 2015-09-20 MED ORDER — SODIUM CHLORIDE 0.9% FLUSH
10.0000 mL | INTRAVENOUS | Status: DC | PRN
  Administered 2015-09-20 (×2): 10 mL
  Filled 2015-09-20 (×2): qty 40

## 2015-09-20 NOTE — Consult Note (Signed)
Referral MD  Reason for Referral: Metastatic colon cancer with vaginal bleeding   Chief Complaint  Patient presents with  . Vaginal Bleeding  : I have been bleeding from my vagina over the past 2 days.  HPI: Tamara [name] is well-known to me. She is an 82 year old white female. She has metastatic colon cancer. She has not been on any therapy for several months because of poor tolerability. We've just been supporting her. She's had a decent quality of life so far. She has been at home.  Last week, she began to have some vaginal bleeding. He saw her in the office on 10 Thursday. We did a CT scan. The CT scan showed extensive metastatic disease in the lung, liver, and pelvis. She had an enlarged uterus which I suspect is invasion by her malignancy.  Her CEA has gone up very quickly. We checked it about a month ago, the CEA was up to 1367.  She was supposed to have an appointment with radiation therapy yesterday. However, she went to the emergency room area and she and her husband live by themselves. I thought it be easiest if she was just admitted over the weekend and radiation therapy can see her on Monday.  She is not complaining of any pain. Her appetite has been okay. His been no nausea or vomiting.  Currently, the bleeding seems to be mild at best.    Her labs today show a white cell count 4.7. Hemoglobin 10.4. Platelet count 98,000.     Past Medical History:  Diagnosis Date  . Breast cancer ([place])   . Colitis   . Colon cancer ([place]) 02/21/2013  . HTN (hypertension) August 2014  . Radiation 01/15/15-02/05/15   pelvis/Hartmann's pouch area  . Sinus arrest 08/28/2012  . Syncope and collapse August 2014   sinus pauses/ PTVDP  :  Past Surgical History:  Procedure Laterality Date  . BOWEL DECOMPRESSION N/A 12/15/2012   Procedure: BOWEL DECOMPRESSION;  Surgeon: Jeryl Columbia, MD;  Location: [facility] ENDOSCOPY;  Service: Endoscopy;  Laterality: N/A;  . BREAST LUMPECTOMY    . CHOLECYSTECTOMY     . COLON RESECTION N/A 12/22/2012   Procedure: EXPLORATORY LAPAROTOMY , RIGHT Colectomy IILEOSTOMY;  Surgeon: Rolm Bookbinder, MD;  Location: [place];  Service: General;  Laterality: N/A;  . COLONOSCOPY N/A 12/15/2012   Procedure: COLONOSCOPY;  Surgeon: Jeryl Columbia, MD;  Location: [facility] ENDOSCOPY;  Service: Endoscopy;  Laterality: N/A;  . FLEXIBLE SIGMOIDOSCOPY N/A 12/06/2012   Procedure: FLEXIBLE SIGMOIDOSCOPY;  Surgeon: Lear Ng, MD;  Location: [facility] ENDOSCOPY;  Service: Endoscopy;  Laterality: N/A;  . FLEXIBLE SIGMOIDOSCOPY N/A 01/06/2015   Procedure: FLEXIBLE SIGMOIDOSCOPY;  Surgeon: Wilford Corner, MD;  Location: [facility] ENDOSCOPY;  Service: Endoscopy;  Laterality: N/A;  . LAPAROSCOPIC PARTIAL COLECTOMY N/A 12/07/2012   Procedure: LAPAROSCOPIC-ASSISTED TRANSVERSE COLECTOMY;  Surgeon: Joyice Faster. Cornett, MD;  Location: [place];  Service: General;  Laterality: N/A;  . LEFT HEART CATHETERIZATION WITH CORONARY ANGIOGRAM Bilateral 08/26/2012   Procedure: LEFT HEART CATHETERIZATION WITH CORONARY ANGIOGRAM;  Surgeon: Lorretta Harp, MD;  Location: [facility] CATH LAB;  Service: Cardiovascular;  Laterality: Bilateral;  . PACEMAKER INSERTION  08/28/12   MDT  . PERMANENT PACEMAKER INSERTION N/A 08/28/2012   Procedure: PERMANENT PACEMAKER INSERTION;  Surgeon: Sanda Klein, MD;  Location: [facility] CATH LAB;  Service: Cardiovascular;  Laterality: N/A;  . TONSILLECTOMY    :   Current Facility-Administered Medications:  .  cholecalciferol (VITAMIN D) tablet 2,000 Units, 2,000 Units, Oral, Q breakfast,  Costin Karlyne Greenspan, MD .  diphenoxylate-atropine (LOMOTIL) 2.5-0.025 MG per tablet 2 tablet, 2 tablet, Oral, QID, Costin Karlyne Greenspan, MD .  feeding supplement (ENSURE ENLIVE) (ENSURE ENLIVE) liquid 237 mL, 237 mL, Oral, BID BM, Costin Karlyne Greenspan, MD .  lidocaine-prilocaine (EMLA) cream 1 application, 1 application, Topical, PRN, Caren Griffins, MD .  megestrol (MEGACE) tablet 40 mg, 40 mg, Oral, Daily, Forde Dandy, MD,  40 mg at 09/19/15 1516 .  multivitamin with minerals tablet 1 tablet, 1 tablet, Oral, Q breakfast, Costin Karlyne Greenspan, MD .  prochlorperazine (COMPAZINE) injection 5 mg, 5 mg, Intravenous, Q6H PRN, Ritta Slot, NP, 5 mg at 09/19/15 2300 .  sodium chloride flush (NS) 0.9 % injection 10-40 mL, 10-40 mL, Intracatheter, PRN, Joseph Art, MD, 10 mL at 09/20/15 0546 .  sodium chloride flush (NS) 0.9 % injection 3 mL, 3 mL, Intravenous, Q12H, Costin Karlyne Greenspan, MD:  . cholecalciferol  2,000 Units Oral Q breakfast  . diphenoxylate-atropine  2 tablet Oral QID  . feeding supplement (ENSURE ENLIVE)  237 mL Oral BID BM  . megestrol  40 mg Oral Daily  . multivitamin with minerals  1 tablet Oral Q breakfast  . sodium chloride flush  3 mL Intravenous Q12H  :  Allergies  Allergen Reactions  . Codeine Nausea And Vomiting  . Zofran [Ondansetron Hcl] Nausea And Vomiting  . Ciprofloxacin     Confusion/dizziness/fatigue  . Metronidazole     Confusion/dizziness/fatigue  . Skelaxin [Metaxalone]     Too strong  . Tetracycline Hcl     Not tolerated  . Avelox [Moxifloxacin] Rash  :  Family History  Problem Relation Age of Onset  . Alzheimer's disease Mother   . Renal Disease Father   . Coronary artery disease Brother   :  Social History   Social History  . Marital status: Married    Spouse name: N/A  . Number of children: N/A  . Years of education: N/A   Occupational History  . Not on file.   Social History Main Topics  . Smoking status: Never Smoker  . Smokeless tobacco: Never Used     Comment: never used tobacco  . Alcohol use No  . Drug use: No  . Sexual activity: Not on file   Other Topics Concern  . Not on file   Social History Narrative  . No narrative on file  :  Pertinent items are noted in HPI.  Exam: Patient Vitals for the past 24 hrs:  BP Temp Temp src Pulse Resp SpO2 Height Weight  09/20/15 0452 (!) 140/55 99.2 F (37.3 C) Oral 64 20 96 % - -  09/19/15 2250 (!)  160/64 98.2 F (36.8 C) Oral 79 (!) 22 100 % - -  09/19/15 2049 132/60 97.3 F (36.3 C) Oral 83 20 100 % - -  09/19/15 1724 166/80 - - 70 18 96 % - -  09/19/15 1519 168/76 - - 90 20 98 % - -  09/19/15 1432 145/99 - - 69 20 95 % - -  09/19/15 1430 145/99 - - 69 - 95 % - -  09/19/15 1353 194/89 - - 106 20 100 % - -  09/19/15 1252 156/72 - - 60 15 100 % - -  09/19/15 1216 184/71 98.2 F (36.8 C) Oral 66 16 99 % 5' 5.5" (1.664 m) 125 lb (56.7 kg)  09/19/15 1148 - - - - - - 5\' 6"  (1.676 m) 125 lb (56.7  kg)  09/19/15 1147 (!) 215/94 97.8 F (36.6 C) Oral 74 18 100 % - -    As above    Recent Labs  09/19/15 1323 09/20/15 0545  WBC 5.9 4.7  HGB 11.1* 10.4*  HCT 31.7* 30.0*  PLT 122* 98*    Recent Labs  09/19/15 1323 09/20/15 0545  NA 136 136  K 4.1 4.0  CL 104 104  CO2 24 27  GLUCOSE 94 98  BUN 24* 28*  CREATININE 0.86 0.84  CALCIUM 10.5* 9.9    Blood smear review:  None  Pathology: None     Assessment and Plan:  Ms. Lyson is 82 year old white female. She has metastatic colon cancer. She has been dealing with this now for about 2 or 3 years. We have tried her on chemotherapy in the past and she really has had a very poor tolerance.  Our goal now is comfort care and palliation of symptoms. I think radiation therapy would help. She has had rectal bleeding in the past because of tumor which responded well to radiation.  I did have a long talk with her about the future. I told her that I really do not think that she was going to make it to November. I think her disease is quite extensive. I think her body will begin to wear out.  She and her husband live by themselves. They really need help from my point of view. I think that hospice would be perfect. To date, she's been very reluctant to have hospice. She's not sure how much hospice can help them. I would think that hospice would be perfect. I suspect that she will need to be placed at a hospice facility as she will  become weaker and her husband will not be able to take care of her.  She will agree to have hospice come out and talk with them.  I'm glad that she is a DO NOT RESUSCITATE. I think this is very appropriate.  We will follow along area we will try to help out in any way possible.  I appreciate all the gray care that she will be getting from everybody up on 4 W.  Lattie Haw, MD  Elta [place] 10:25

## 2015-09-20 NOTE — Progress Notes (Addendum)
Patient ID: Tamara [name], female   DOB: Jan 08, 1930, 82 y.o.   MRN: [id number]  PROGRESS NOTE    VALOIS PALMITER  [id number] DOB: Nov 14, 1929 DOA: 09/19/2015  PCP: Henrine Screws, MD   Brief Narrative:  82 y.o. female with past medical history significant for hypertension, colon cancer s/p resection and ostomy, recently restaged with a CT scan on 09/16/2015 which showed progression of disease, multiple new pulmonary lesions and hepatic lesions, new peritoneal implant in the posterior pelvis as well as enlarging mass within the uterus, DCIS right breast 2009, Pt presented with vaginal bleeding she noticed about few days prior to this admission. She was hemodynamically stable on admission. Hemoglobin was 11.1. EDP discussed with OB/GYN who recommended Megace.   Assessment & Plan:   Vaginal bleeding / Anemia of chronic disease / Acute blood loss anemia  - Likely due to her progression of metastatic colon cancer, as evidenced on recent restaging CT - Continue megace 40 mg daily - Appreciate Dr. Marin Olp seeing the pt in consultation - Hgb 10.4, stable  Colon cancer metastasized to multiple sites - Per oncology   Thrombocytopenia  - Due to history of malignancy - Chronic, stable   Ileostomy in place  - Continue Lomotil 4 times daily as per home regimen   PPM  - Stable - No chest pain  Hypercalcemia - Due to dehydration - Resolved with IV fluids    DVT prophylaxis: SCD's bilaterally  Code Status: DNR/DNI Family Communication: no family at the bedside this am Disposition Plan: home in next 2-3 days    Consultants:   Oncology, Dr. Marin Olp   Procedures:   None   Antimicrobials:   None    Subjective: No overnight events.  Objective: Vitals:   09/19/15 2049 09/19/15 2250 09/20/15 0452 09/20/15 1500  BP: 132/60 (!) 160/64 (!) 140/55 (!) 131/58  Pulse: 83 79 64 80  Resp: 20 (!) 22 20 20   Temp: 97.3 F (36.3 C) 98.2 F (36.8 C) 99.2 F (37.3 C) 98.2 F  (36.8 C)  TempSrc: Oral Oral Oral Oral  SpO2: 100% 100% 96% 99%  Weight:      Height:        Intake/Output Summary (Last 24 hours) at 09/20/15 1809 Last data filed at 09/20/15 1500  Gross per 24 hour  Intake              540 ml  Output                4 ml  Net              536 ml   Filed Weights   09/19/15 1148 09/19/15 1216  Weight: 56.7 kg (125 lb) 56.7 kg (125 lb)    Examination:  General exam: Appears calm and comfortable  Respiratory system: Clear to auscultation. Respiratory effort normal. Cardiovascular system: S1 & S2 heard, RRR. Gastrointestinal system: Abdomen is nondistended, soft and nontender. No organomegaly or masses felt. Normal bowel sounds heard. Central nervous system: Alert and oriented. No focal neurological deficits. Extremities: Symmetric 5 x 5 power. Skin: No rashes, lesions or ulcers Psychiatry: Judgement and insight appear normal. Mood & affect appropriate.   Data Reviewed: I have personally reviewed following labs and imaging studies  CBC:  Recent Labs Lab 09/16/15 0907 09/19/15 1323 09/20/15 0545  WBC 5.9 5.9 4.7  NEUTROABS 4.0 4.0  --   HGB 11.5* 11.1* 10.4*  HCT 32.9* 31.7* 30.0*  MCV 104* 102.6* 102.7*  PLT 94*  122* 98*   Basic Metabolic Panel:  Recent Labs Lab 09/16/15 0907 09/19/15 1323 09/20/15 0545  NA 133 136 136  K 4.5 4.1 4.0  CL 101 104 104  CO2 29 24 27   GLUCOSE 94 94 98  BUN 23* 24* 28*  CREATININE 1.2 0.86 0.84  CALCIUM 10.1 10.5* 9.9   GFR: Estimated Creatinine Clearance: 44.6 mL/min (by C-G formula based on SCr of 0.84 mg/dL). Liver Function Tests:  Recent Labs Lab 09/16/15 0907  AST 63*  ALT 23  ALKPHOS 120*  BILITOT 1.00  PROT 7.3  ALBUMIN 3.6   No results for input(s): LIPASE, AMYLASE in the last 168 hours. No results for input(s): AMMONIA in the last 168 hours. Coagulation Profile: No results for input(s): INR, PROTIME in the last 168 hours. Cardiac Enzymes: No results for input(s):  CKTOTAL, CKMB, CKMBINDEX, TROPONINI in the last 168 hours. BNP (last 3 results) No results for input(s): PROBNP in the last 8760 hours. HbA1C: No results for input(s): HGBA1C in the last 72 hours. CBG:  Recent Labs Lab 09/19/15 2252  GLUCAP 126*   Lipid Profile: No results for input(s): CHOL, HDL, LDLCALC, TRIG, CHOLHDL, LDLDIRECT in the last 72 hours. Thyroid Function Tests: No results for input(s): TSH, T4TOTAL, FREET4, T3FREE, THYROIDAB in the last 72 hours. Anemia Panel: No results for input(s): VITAMINB12, FOLATE, FERRITIN, TIBC, IRON, RETICCTPCT in the last 72 hours. Urine analysis:    Component Value Date/Time   COLORURINE YELLOW 09/19/2015 1253   APPEARANCEUR CLOUDY (A) 09/19/2015 1253   LABSPEC 1.021 09/19/2015 1253   PHURINE 5.5 09/19/2015 1253   GLUCOSEU NEGATIVE 09/19/2015 1253   HGBUR LARGE (A) 09/19/2015 1253   BILIRUBINUR NEGATIVE 09/19/2015 1253   KETONESUR NEGATIVE 09/19/2015 1253   PROTEINUR 100 (A) 09/19/2015 1253   UROBILINOGEN 0.2 03/05/2013 1843   NITRITE NEGATIVE 09/19/2015 1253   LEUKOCYTESUR NEGATIVE 09/19/2015 1253   Sepsis Labs: @LABRCNTIP (procalcitonin:4,lacticidven:4)   )No results found for this or any previous visit (from the past 240 hour(s)).    Radiology Studies: No results found.   Scheduled Meds: . B-complex with vitamin C  1 tablet Oral Daily  . cholecalciferol  2,000 Units Oral Q breakfast  . diphenoxylate-atropine  2 tablet Oral QID  . feeding supplement (ENSURE ENLIVE)  237 mL Oral BID BM  . megestrol  40 mg Oral Daily  . multivitamin with minerals  1 tablet Oral Q breakfast  . sodium chloride flush  3 mL Intravenous Q12H   Continuous Infusions:    LOS: 0 days    Time spent: 25 minutes  Greater than 50% of the time spent on counseling and coordinating the care.   Leisa Lenz, MD [facility] Pager [phone]  If 7PM-7AM, please contact night-coverage [email or web address] Password [facility] 09/20/2015, 6:09 PM

## 2015-09-21 DIAGNOSIS — N939 Abnormal uterine and vaginal bleeding, unspecified: Secondary | ICD-10-CM | POA: Diagnosis not present

## 2015-09-21 DIAGNOSIS — C189 Malignant neoplasm of colon, unspecified: Secondary | ICD-10-CM

## 2015-09-21 DIAGNOSIS — I10 Essential (primary) hypertension: Secondary | ICD-10-CM | POA: Diagnosis not present

## 2015-09-21 DIAGNOSIS — Z932 Ileostomy status: Secondary | ICD-10-CM

## 2015-09-21 DIAGNOSIS — D509 Iron deficiency anemia, unspecified: Secondary | ICD-10-CM | POA: Diagnosis not present

## 2015-09-21 DIAGNOSIS — R970 Elevated carcinoembryonic antigen [CEA]: Secondary | ICD-10-CM | POA: Diagnosis not present

## 2015-09-21 LAB — BASIC METABOLIC PANEL
ANION GAP: 7 (ref 5–15)
BUN: 33 mg/dL — ABNORMAL HIGH (ref 6–20)
CALCIUM: 9.8 mg/dL (ref 8.9–10.3)
CO2: 24 mmol/L (ref 22–32)
CREATININE: 0.89 mg/dL (ref 0.44–1.00)
Chloride: 103 mmol/L (ref 101–111)
GFR, EST NON AFRICAN AMERICAN: 58 mL/min — AB (ref >60)
Glucose, Bld: 104 mg/dL — ABNORMAL HIGH (ref 65–99)
Potassium: 3.9 mmol/L (ref 3.5–5.1)
Sodium: 134 mmol/L — ABNORMAL LOW (ref 135–145)

## 2015-09-21 MED ORDER — SODIUM CHLORIDE 0.9 % IV SOLN
510.0000 mg | Freq: Once | INTRAVENOUS | Status: AC
  Administered 2015-09-21: 510 mg via INTRAVENOUS
  Filled 2015-09-21: qty 17

## 2015-09-21 NOTE — Progress Notes (Signed)
Tamara [name] is doing pretty good. She's had no bleeding since she been hospitalized. Possibly, the Megace that she is on is helping.  She does feel tired. She does not have a lot of energy. I think that her iron levels are on the low side area and as such, we will go ahead and give her a dose of IV iron.  She is eating okay. I think should have a regular diet. I don't he's she needs a heart healthy diet given that she has end-stage malignancy.  She's had no nausea or vomiting. She's had no cough.  On her physical exam, her vital signs are pretty stable. Temperature 98.1. Pulse 69. Blood pressure 120/60. Head and neck exam shows no ocular or oral lesions. She has no adenopathy in the neck. Lungs are clear bilaterally. Cardiac exam regular in rhythm with no murmurs, rubs or bruits. Abdomen is soft. There is no guarding or rebound tenderness. There is no fluid wave. There is no obvious abdominal mass. Extremities shows no clubbing, cyanosis or edema. She may have some muscle atrophy in upper and lower extremities. Neurological exam shows no focal neurological deficits.  Tamara [name] has metastatic colon cancer. She has extensive disease. she has vaginal bleeding.  Once radiation therapy comes in on Monday, we will see about getting her down there to start palliative radiation.  We will see how the iron does for her.  I'm glad that she is relatively comfortable right now.  I will continue do talked her about hospice. I really think that they will help her and her husband. Maybe, I will call them tomorrow they can stop by and talk with her  As always, the staff are doing a great job.   Lattie Haw, MD  Darlyn Chamber 17:14

## 2015-09-21 NOTE — Progress Notes (Signed)
Patient ID: Tamara [name], female   DOB: 04/14/1929, 82 y.o.   MRN: [id number]  PROGRESS NOTE    Tamara [name]  [id number] DOB: [birth year] DOA: 09/19/2015  PCP: Henrine Screws, MD   Brief Narrative:  82 y.o. female with past medical history significant for hypertension, colon cancer s/p resection and ostomy, recently restaged with a CT scan on 09/16/2015 which showed progression of disease, multiple new pulmonary lesions and hepatic lesions, new peritoneal implant in the posterior pelvis as well as enlarging mass within the uterus, DCIS right breast 2009, Pt presented with vaginal bleeding she noticed about few days prior to this admission. She was hemodynamically stable on admission. Hemoglobin was 11.1. EDP discussed with OB/GYN who recommended Megace.   Assessment & Plan:   Vaginal bleeding / Anemia of chronic disease / Acute blood loss anemia  - Likely due to her progression of metastatic colon cancer, as evidenced on recent restaging CT - Continue megace 40 mg daily - Appreciate Dr. Marin Olp seeing the pt in consultation - Hgb overall stable, pt will get IV Iron per Dr. Jonette Eva  - CBC in AM  Colon cancer metastasized to multiple sites - Per oncology  - plan for radiation therapy in AM   Thrombocytopenia  - Due to history of malignancy - Chronic, stable   Ileostomy in place  - Continue Lomotil 4 times daily as per home regimen   Hypercalcemia - Due to dehydration - Resolved with IV fluids   DVT prophylaxis: SCD's bilaterally  Code Status: DNR/DNI Family Communication: no family at the bedside this am Disposition Plan: home in 1-2 days    Consultants:   Oncology, Dr. Marin Olp   Procedures:   None   Antimicrobials:   None    Subjective: No overnight events.  Objective: Vitals:   09/20/15 0452 09/20/15 1500 09/20/15 2115 09/21/15 0520  BP: (!) 140/55 (!) 131/58 136/60 128/60  Pulse: 64 80 73 69  Resp: 20 20 20 20   Temp: 99.2 F (37.3 C) 98.2  F (36.8 C) 98.1 F (36.7 C) 98.1 F (36.7 C)  TempSrc: Oral Oral Oral Oral  SpO2: 96% 99% 96% 98%  Weight:      Height:        Intake/Output Summary (Last 24 hours) at 09/21/15 1111 Last data filed at 09/20/15 1900  Gross per 24 hour  Intake              240 ml  Output                0 ml  Net              240 ml   Filed Weights   09/19/15 1148 09/19/15 1216  Weight: 56.7 kg (125 lb) 56.7 kg (125 lb)    Examination:  General exam: Appears calm and comfortable  Respiratory system: Clear to auscultation. Respiratory effort normal. Cardiovascular system: S1 & S2 heard, RRR. Gastrointestinal system: Abdomen is nondistended, soft and nontender. No organomegaly or masses felt.   Data Reviewed: I have personally reviewed following labs and imaging studies  CBC:  Recent Labs Lab 09/16/15 0907 09/19/15 1323 09/20/15 0545  WBC 5.9 5.9 4.7  NEUTROABS 4.0 4.0  --   HGB 11.5* 11.1* 10.4*  HCT 32.9* 31.7* 30.0*  MCV 104* 102.6* 102.7*  PLT 94* 122* 98*   Basic Metabolic Panel:  Recent Labs Lab 09/16/15 0907 09/19/15 1323 09/20/15 0545 09/21/15 0500  NA 133 136 136 134*  K 4.5  4.1 4.0 3.9  CL 101 104 104 103  CO2 29 24 27 24   GLUCOSE 94 94 98 104*  BUN 23* 24* 28* 33*  CREATININE 1.2 0.86 0.84 0.89  CALCIUM 10.1 10.5* 9.9 9.8   Liver Function Tests:  Recent Labs Lab 09/16/15 0907  AST 63*  ALT 23  ALKPHOS 120*  BILITOT 1.00  PROT 7.3  ALBUMIN 3.6   CBG:  Recent Labs Lab 09/19/15 2252  GLUCAP 126*   Urine analysis:    Component Value Date/Time   COLORURINE YELLOW 09/19/2015 1253   APPEARANCEUR CLOUDY (A) 09/19/2015 1253   LABSPEC 1.021 09/19/2015 1253   PHURINE 5.5 09/19/2015 1253   GLUCOSEU NEGATIVE 09/19/2015 1253   HGBUR LARGE (A) 09/19/2015 1253   BILIRUBINUR NEGATIVE 09/19/2015 1253   KETONESUR NEGATIVE 09/19/2015 1253   PROTEINUR 100 (A) 09/19/2015 1253   UROBILINOGEN 0.2 03/05/2013 1843   NITRITE NEGATIVE 09/19/2015 1253    LEUKOCYTESUR NEGATIVE 09/19/2015 1253   Radiology Studies: No results found.  Scheduled Meds: . B-complex with vitamin C  1 tablet Oral Daily  . cholecalciferol  2,000 Units Oral Q breakfast  . diphenoxylate-atropine  2 tablet Oral QID  . feeding supplement (ENSURE ENLIVE)  237 mL Oral BID BM  . megestrol  40 mg Oral Daily  . multivitamin with minerals  1 tablet Oral Q breakfast  . sodium chloride flush  3 mL Intravenous Q12H   Continuous Infusions:    LOS: 0 days    Time spent: 25 minutes  Greater than 50% of the time spent on counseling and coordinating the care.   Faye Ramsay, MD [facility] Pager [phone]  If 7PM-7AM, please contact night-coverage [email or web address] Password [email or web address] 09/21/2015, 11:11 AM

## 2015-09-22 ENCOUNTER — Telehealth: Payer: Self-pay | Admitting: Oncology

## 2015-09-22 ENCOUNTER — Ambulatory Visit
Admit: 2015-09-22 | Discharge: 2015-09-22 | Disposition: A | Discharge status: Home / Self Care | Payer: Medicare Other | Attending: Radiation Oncology | Admitting: Radiation Oncology

## 2015-09-22 ENCOUNTER — Ambulatory Visit
Admission: RE | Admit: 2015-09-22 | Discharge: 2015-09-22 | Disposition: A | Discharge status: Home / Self Care | Payer: Medicare Other | Source: Ambulatory Visit | Attending: Radiation Oncology | Admitting: Radiation Oncology

## 2015-09-22 ENCOUNTER — Telehealth: Payer: Self-pay | Admitting: *Deleted

## 2015-09-22 DIAGNOSIS — C801 Malignant (primary) neoplasm, unspecified: Secondary | ICD-10-CM

## 2015-09-22 DIAGNOSIS — C799 Secondary malignant neoplasm of unspecified site: Secondary | ICD-10-CM | POA: Diagnosis not present

## 2015-09-22 DIAGNOSIS — C7982 Secondary malignant neoplasm of genital organs: Secondary | ICD-10-CM

## 2015-09-22 DIAGNOSIS — N939 Abnormal uterine and vaginal bleeding, unspecified: Secondary | ICD-10-CM | POA: Diagnosis not present

## 2015-09-22 DIAGNOSIS — R970 Elevated carcinoembryonic antigen [CEA]: Secondary | ICD-10-CM | POA: Diagnosis not present

## 2015-09-22 DIAGNOSIS — C189 Malignant neoplasm of colon, unspecified: Secondary | ICD-10-CM | POA: Insufficient documentation

## 2015-09-22 DIAGNOSIS — Z510 Encounter for antineoplastic radiation therapy: Secondary | ICD-10-CM | POA: Diagnosis not present

## 2015-09-22 DIAGNOSIS — I10 Essential (primary) hypertension: Secondary | ICD-10-CM | POA: Diagnosis not present

## 2015-09-22 LAB — CBC
HEMATOCRIT: 29.6 % — AB (ref 36.0–46.0)
HEMOGLOBIN: 10.2 g/dL — AB (ref 12.0–15.0)
MCH: 35.3 pg — ABNORMAL HIGH (ref 26.0–34.0)
MCHC: 34.5 g/dL (ref 30.0–36.0)
MCV: 102.4 fL — AB (ref 78.0–100.0)
Platelets: 103 10*3/uL — ABNORMAL LOW (ref 150–400)
RBC: 2.89 MIL/uL — ABNORMAL LOW (ref 3.87–5.11)
RDW: 13.5 % (ref 11.5–15.5)
WBC: 5.8 10*3/uL (ref 4.0–10.5)

## 2015-09-22 LAB — BASIC METABOLIC PANEL
Anion gap: 5 (ref 5–15)
BUN: 32 mg/dL — AB (ref 6–20)
CALCIUM: 9.8 mg/dL (ref 8.9–10.3)
CO2: 25 mmol/L (ref 22–32)
Chloride: 104 mmol/L (ref 101–111)
Creatinine, Ser: 0.99 mg/dL (ref 0.44–1.00)
GFR, EST AFRICAN AMERICAN: 59 mL/min — AB (ref >60)
GFR, EST NON AFRICAN AMERICAN: 51 mL/min — AB (ref >60)
GLUCOSE: 98 mg/dL (ref 65–99)
POTASSIUM: 4.1 mmol/L (ref 3.5–5.1)
Sodium: 134 mmol/L — ABNORMAL LOW (ref 135–145)

## 2015-09-22 MED ORDER — MEGESTROL ACETATE 40 MG PO TABS: 40.0000 mg | ORAL_TABLET | Freq: Every day | ORAL | 0 refills | Status: AC

## 2015-09-22 MED ORDER — HEPARIN SOD (PORK) LOCK FLUSH 100 UNIT/ML IV SOLN
500.0000 [IU] | INTRAVENOUS | Status: AC | PRN
  Administered 2015-09-22: 500 [IU]

## 2015-09-22 MED ORDER — PROCHLORPERAZINE MALEATE 10 MG PO TABS: 10.0000 mg | ORAL_TABLET | Freq: Four times a day (QID) | ORAL | 0 refills | Status: AC | PRN

## 2015-09-22 NOTE — Discharge Summary (Signed)
Physician Discharge Summary  Tamara [name] [id number] DOB: [birth year] DOA: 09/19/2015  PCP: Henrine Screws, MD  Admit date: 09/19/2015 Discharge date: 09/22/2015  Recommendations for Outpatient Follow-up:  1. Pt will need to follow up with PCP as needed   Discharge Diagnoses:  Active Problems:   Pacemaker implanted 08/28/12   Colon cancer ([place])   Acute posthemorrhagic anemia   Accelerated essential hypertension   Ileostomy in place [facility])   Colon cancer metastasized to multiple sites [facility])   Vaginal bleeding   Discharge Condition: Stable  Diet recommendation: Heart healthy diet discussed in details   Brief Narrative:  82 y.o.femalewith past medical history significant for hypertension, colon cancer s/p resection and ostomy, recently restaged with a CT scan on 09/16/2015 which showed progression of disease, multiple new pulmonary lesions and hepatic lesions, new peritoneal implant in the posterior pelvis as well as enlarging mass within the uterus, DCIS right breast 2009, Pt presented with vaginal bleeding she noticed about few days prior to this admission. She was hemodynamically stable on admission. Hemoglobin was 11.1. EDP discussed with OB/GYN who recommended Megace.   Assessment & Plan:   Vaginal bleeding / Anemia of chronic disease / Acute blood loss anemia  - Likely due to her progression of metastatic colon cancer, as evidenced on recent restaging CT - Continue megace 40 mg daily - Appreciate Dr. Marin Olp seeing the pt in consultation - Hgb overall stable, pt got IV Iron per Dr. Jonette Eva   Colon cancer metastasized to multiple sites - Per oncology  - plan for radiation therapy this afternoon and pt can be discharged home after treatment   Thrombocytopenia  - Due to history of malignancy - Chronic, stable   Ileostomy in place  - Continue Lomotil 4 times daily as per home regimen   Hypercalcemia - Due to dehydration - Resolved with IV  fluids   DVT prophylaxis: SCD's bilaterally  Code Status: DNR/DNI Family Communication: no family at the bedside this am Disposition Plan: home    Consultants:   Oncology, Dr. Marin Olp   Procedures:   None   Antimicrobials:   None   Procedures/Studies: Ct Abdomen Pelvis W Contrast  Result Date: 09/16/2015 CLINICAL DATA:  Metastatic colon cancer EXAM: CT ABDOMEN AND PELVIS WITH CONTRAST TECHNIQUE: Multidetector CT imaging of the abdomen and pelvis was performed using the standard protocol following bolus administration of intravenous contrast. CONTRAST:  74mL ISOVUE-300 IOPAMIDOL (ISOVUE-300) INJECTION 61% COMPARISON:  03/06/2015 FINDINGS: Lower chest: No pleural effusion. Nodule in the right lower lobe measures 11 mm, image 6 of series 4. Previously 3 mm. Left lung base lesion measures 2.4 cm, image 6 of series 4. Previously 1.3 cm. New right base lesion measures 1.4 cm, image 9 of series 4. Hepatobiliary: Numerous liver lesions are identified. Most of these are new from previous exam. Index lesion within segment 8 measures 3.4 cm, image 15 of series 2. Lateral segment of left lobe of liver lesion is new measuring 3.3 cm, image 29 of series 2. Segment 6 liver lesion is new measuring 2.7 cm, image 15 of series 2. Previous cholecystectomy. Pancreas: No mass, inflammatory changes, or other significant abnormality. Spleen: Within normal limits in size and appearance. Adrenals/Urinary Tract: No masses identified. No evidence of hydronephrosis. Stomach/Bowel: The stomach is normal. There is no dilatation of the small bowel loops. Right lower quadrant colostomy is noted and appears patent. Vascular/Lymphatic: Calcified atherosclerotic disease involves the abdominal aorta. No aneurysm. No retroperitoneal adenopathy. No pelvic or inguinal adenopathy. Reproductive:  There is an enlarging mass involving the uterus. This measures 3.4 x 3.8 cm, image 58 of series 2. Previously this measured 2.6 x 3.0  cm, image 58 of series 2. Other: There is no ascites or focal fluid collections within the abdomen or pelvis. Peritoneal nodule within the left posterior pelvis is new measuring 1.1 cm, image 55 of series 2. Musculoskeletal: No aggressive lytic or sclerotic bone lesions. The bones appear osteopenic. IMPRESSION: 1. There is been interval progression of disease. 2. Multiple new pulmonary lesions and hepatic lesions are identified compatible with progression of metastasis. 3. New peritoneal implant is identified within the posterior pelvis. 4. Enlarging mass within the uterus is worrisome for either metastatic disease or direct invasion. Electronically Signed   By: Kerby Moors M.D.   On: 09/16/2015 13:09    Discharge Exam: Vitals:   09/21/15 2111 09/22/15 0553  BP: 128/70 (!) 121/51  Pulse: 67 70  Resp: 18 16  Temp: 99 F (37.2 C) 97.6 F (36.4 C)   Vitals:   09/21/15 0520 09/21/15 1538 09/21/15 2111 09/22/15 0553  BP: 128/60 (!) 135/56 128/70 (!) 121/51  Pulse: 69 70 67 70  Resp: 20 20 18 16   Temp: 98.1 F (36.7 C) 98.6 F (37 C) 99 F (37.2 C) 97.6 F (36.4 C)  TempSrc: Oral Oral Oral Oral  SpO2: 98% 98% 96% 95%  Weight:      Height:        General: Pt is alert, follows commands appropriately, not in acute distress Cardiovascular: Regular rate and rhythm, S1/S2 +, no murmurs, no rubs, no gallops Respiratory: Clear to auscultation bilaterally, no wheezing, no crackles, no rhonchi Abdominal: Soft, non tender, non distended, bowel sounds +, no guarding Extremities: no edema, no cyanosis, pulses palpable bilaterally DP and PT Neuro: Grossly nonfocal  Discharge Instructions  Discharge Instructions    Diet - low sodium heart healthy    Complete by:  As directed   Increase activity slowly    Complete by:  As directed       Medication List    TAKE these medications   antiseptic oral rinse Liqd 15 mLs by Mouth Rinse route 5 (five) times daily. After brushing teeth   b  complex vitamins tablet Take 1 tablet by mouth every other day.   Biotin 5 MG Tabs Take 1 tablet by mouth every morning.   diphenoxylate-atropine 2.5-0.025 MG tablet Commonly known as:  LOMOTIL take 2 tablets by mouth four times a day   feeding supplement (ENSURE COMPLETE) Liqd Take 237 mLs by mouth 2 (two) times daily between meals. *Vanilla*   lidocaine-prilocaine cream Commonly known as:  EMLA Apply 1 application topically as needed. Apply to port site one hour before access   megestrol 40 MG tablet Commonly known as:  MEGACE Take 1 tablet (40 mg total) by mouth daily.   multivitamin with minerals Tabs tablet Take 1 tablet by mouth daily with breakfast.   prochlorperazine 10 MG tablet Commonly known as:  COMPAZINE Take 1 tablet (10 mg total) by mouth every 6 (six) hours as needed for nausea or vomiting.   SENSODYNE Pste Place 1 application onto teeth 5 (five) times daily.   SUPER B COMPLEX PO Take 1 tablet by mouth every other day. Alternating days with B complex   Vitamin D 2000 units tablet Take 2,000 Units by mouth daily with breakfast.      Follow-up Information    GATES,ROBERT NEVILL, MD .   Specialty:  Internal  Medicine Contact information: [address] [place] [address] [phone]        Faye Ramsay, MD .   Specialty:  Internal Medicine Contact information: [address] [place] [address] [phone]            The results of significant diagnostics from this hospitalization (including imaging, microbiology, ancillary and laboratory) are listed below for reference.     Microbiology: No results found for this or any previous visit (from the past 240 hour(s)).   Labs: Basic Metabolic Panel:  Recent Labs Lab 09/16/15 0907 09/19/15 1323 09/20/15 0545 09/21/15 0500 09/22/15 0500  NA 133 136 136 134* 134*  K 4.5 4.1 4.0 3.9 4.1  CL 101 104 104 103 104  CO2 29 24 27 24 25    GLUCOSE 94 94 98 104* 98  BUN 23* 24* 28* 33* 32*  CREATININE 1.2 0.86 0.84 0.89 0.99  CALCIUM 10.1 10.5* 9.9 9.8 9.8   Liver Function Tests:  Recent Labs Lab 09/16/15 0907  AST 63*  ALT 23  ALKPHOS 120*  BILITOT 1.00  PROT 7.3  ALBUMIN 3.6   CBC:  Recent Labs Lab 09/16/15 0907 09/19/15 1323 09/20/15 0545 09/22/15 0500  WBC 5.9 5.9 4.7 5.8  NEUTROABS 4.0 4.0  --   --   HGB 11.5* 11.1* 10.4* 10.2*  HCT 32.9* 31.7* 30.0* 29.6*  MCV 104* 102.6* 102.7* 102.4*  PLT 94* 122* 98* 103*   CBG:  Recent Labs Lab 09/19/15 2252  GLUCAP 126*   SIGNED: Time coordinating discharge:  30 minutes  MAGICK-Banyan Goodchild, MD  [facility] 09/22/2015, 10:33 AM Pager [phone]  If 7PM-7AM, please contact night-coverage [email or web address] Password [email or web address]

## 2015-09-22 NOTE — Telephone Encounter (Signed)
Talked to Ms. Rimel's nurse, Susie, RN who said that patient is OK to come for CT Simulation this morning.  She travels by wheelchair.  Notified Candace, RT in CT SIM.

## 2015-09-22 NOTE — Progress Notes (Addendum)
  Radiation Oncology         [phone] ________________________________  Name: Tamara [name] MRN: [id number]  Date: 09/22/2015  DOB: 11/11/1929  SIMULATION AND TREATMENT PLANNING NOTE  INPATIENT  DIAGNOSIS:     ICD-9-CM ICD-10-CM   1. Metastasis from colon cancer ([place]) 199.1 C79.9    153.9 C18.9      Site: Pelvis  NARRATIVE:  The patient was brought to the [facility].  Identity was confirmed.  All relevant records and images related to the planned course of therapy were reviewed.   Written consent to proceed with treatment was confirmed which was freely given after reviewing the details related to the planned course of therapy had been reviewed with the patient.  Then, the patient was set-up in a stable reproducible  supine position for radiation therapy.  CT images were obtained.  Surface markings were placed.    Medically necessary complex treatment device(s) for immobilization: customized VAC-LOCK bag.  The CT images were loaded into the planning software.  Then the target and avoidance structures were contoured.  Treatment planning then occurred.  The radiation prescription was entered and confirmed.  A total of 4 complex treatment devices were fabricated which relate to the designed radiation treatment fields. Each of these customized fields/ complex treatment devices will be used on a daily basis during the radiation course. I have requested : 3D Simulation  I have requested a DVH of the following structures: Target volume, bladder, bowel, left femoral head, right femoral head.  The patient will undergo daily image guidance to ensure accurate localization of the target, and adequate minimize dose to the normal surrounding structures in close proximity to the target.    PLAN:  The patient will receive 30 Gy in 10 fractions to the pelvis.  The patient will undergo daily image guidance to ensure accurate localization of the target, and adequate minimize dose to  the normal surrounding structures in close proximity to the target.    Special treatment procedure The planned course of treatment corresponds to a course of reirradiation. The patient has received prior radiation treatment to the pelvis and this upcoming treatment will overlap with this. The chemotherapy the plan will be carefully reviewed and this extra work constitutes a special treatment procedure. ________________________________   Jodelle Gross, MD, PhD  This document serves as a record of services personally performed by Kyung Rudd, MD. It was created on his behalf by Darcus Austin, a trained medical scribe. The creation of this record is based on the scribe's personal observations and the provider's statements to them. This document has been checked and approved by the attending provider.

## 2015-09-22 NOTE — Telephone Encounter (Signed)
Inpatient nurse calling to reschedule patient's 4:00 appointment due to discharge from hospital at this time.  Call transferred to RT ext [phone].

## 2015-09-22 NOTE — Progress Notes (Signed)
Ms. Upchurch is a little bit tired this morning. She otherwise is doing okay. She's not had any obvious bleeding. Maybe, the Megace is helping.  Her hemoglobin is 10.2 which is pretty stable.  She got IV iron yesterday.  She's having no abdominal pain. She seems to be eating fairly well. She's had no nausea or vomiting. There's been no cough. She's had no leg swelling. She is out of bed a little bit.  On her physical exam, her vital signs are all stable. Her blood pressures 121/51. Abdomen is soft. She has decent bowel sounds. There's no fluid wave. There is no guarding or rebound tenderness. Lungs are decreased at bases. Cardiac exam regular rate and rhythm. Extremities shows no edema. She has some slight muscle atrophy. Skin exam shows no rashes. Neurological exam is nonfocal.  Ms. Baiza has metastatic colon cancer. I suspect that she has invasion into her uterus. I will call radiation oncology today and hopefully [place] started on therapy with her today.  I talked her about hospice again. I think it would help of hospice would come and see her. She would probably be agreeable to [facility] if she cannot go home., Sure how well her husband can care for her. I don't think she is ready for [facility] right now.  I did speak to her over the weekend. Again I told her that I just do not think that should make it through November. I think she will decline pretty quickly at the right time.  Thankfully, she is not in any pain.  I do appreciate the care that she is getting from everybody up [address] Ms. Folkers is also very impressed with the quality of care.  Lattie Haw, MD  Psalm 91:1-2

## 2015-09-22 NOTE — Discharge Instructions (Signed)
External Beam Radiation Therapy External beam radiation therapy is a radiation treatment. It may be done to:  Treat cancer. The radiation may be used to:  Destroy cancer cells. Radiation delivered during the treatment damages cancer cells. It also damages normal cells, but normal cells have the DNA to repair themselves while cancer cells do not.  Help with symptoms of your cancer.  Stop the growth of any remaining cancer cells after surgery.  Prevent cancer cells from growing in areas that do not have evidence of cancer (prophylactic radiation therapy).  Treat or shrink a tumor.  Reduce pain (palliative therapy). The therapy delivers higher doses of radiation than X-rays, CT scans, and most other imaging tests. Compared with internal radiation therapy, external beam radiation therapy can deliver radiation to a fairly large area.  The amount of radiation you will receive and the length of therapy depends on your medical condition. You should not feel the radiation being delivered or any pain during your therapy. RISKS AND COMPLICATIONS Most people experience side effects from the therapy. Side effects depend on the amount of radiation and the part of your body exposed to radiation. For example:  Hair loss may occur if the radiation therapy is directed to your head.  Coughing or difficulty swallowing may occur if the radiation therapy is directed to your head, neck, or chest.  Nausea, vomiting, or diarrhea may occur if the radiation therapy is directed to your abdomen or pelvis.  Bladder problems, frequent urination, or sexual dysfunction may occur if the radiation therapy is directed to your bladder, kidney, or prostate. Regardless of the amount or location of the radiation, you will probably have fatigue. Other side effects may include:  Red, flaking skin in the affected area.  Hair loss in the affected area.  Itching in the affected area. Side effects may take 2-3 weeks to  develop. Most side effects are temporary and can be controlled. Once the therapy is complete, side effects will not stop right away. It can take up to 3-4 weeks for you to regain your energy or for side effects to lessen. Your body does heal from the radiation. BEFORE THE PROCEDURE There will be a planning session (simulation). During the session:  Your health care provider will plan exactly where the radiation will be delivered (treatment field).  You will be positioned for your therapy. The goal is to have a position that can be reproduced for each therapy session.  Temporary marks may be drawn on your body. Permanent marks may also be drawn on your body in order for you to be positioned the same way for each therapy session. PROCEDURE  You will either lie on a table or sit in a chair in the position determined for your therapy.  The radiation machine (linear accelerator) will move around you to deliver the radiation in exact doses from many angles. AFTER THE PROCEDURE You may return to your normal schedule including diet, activities, and medicines, unless your health care provider tells you otherwise.   This information is not intended to replace advice given to you by your health care provider. Make sure you discuss any questions you have with your health care provider.   Document Released: 05/30/2008 Document Revised: 10/02/2014 Document Reviewed: 12/20/2012 Elsevier Interactive Patient Education [employer].

## 2015-09-22 NOTE — Care Management Obs Status (Signed)
[facility] NOTIFICATION   Patient Details  Name: RAVLEEN BOCCIA MRN: [id number] Date of Birth: 13-Nov-1929   Medicare Observation Status Notification Given:  Yes    Purcell Mouton, RN 09/22/2015, 12:32 PM

## 2015-09-22 NOTE — Progress Notes (Signed)
Bandaid

## 2015-09-23 ENCOUNTER — Ambulatory Visit
Admission: RE | Admit: 2015-09-23 | Discharge: 2015-09-23 | Disposition: A | Discharge status: Home / Self Care | Payer: Medicare Other | Source: Ambulatory Visit | Attending: Radiation Oncology | Admitting: Radiation Oncology

## 2015-09-23 ENCOUNTER — Encounter: Payer: Self-pay | Admitting: Radiation Oncology

## 2015-09-23 DIAGNOSIS — Z510 Encounter for antineoplastic radiation therapy: Secondary | ICD-10-CM | POA: Diagnosis not present

## 2015-09-23 DIAGNOSIS — C799 Secondary malignant neoplasm of unspecified site: Secondary | ICD-10-CM | POA: Diagnosis not present

## 2015-09-23 DIAGNOSIS — C189 Malignant neoplasm of colon, unspecified: Secondary | ICD-10-CM | POA: Diagnosis not present

## 2015-09-24 ENCOUNTER — Encounter: Payer: Self-pay | Admitting: *Deleted

## 2015-09-24 ENCOUNTER — Ambulatory Visit
Admission: RE | Admit: 2015-09-24 | Discharge: 2015-09-24 | Disposition: A | Discharge status: Home / Self Care | Payer: Medicare Other | Source: Ambulatory Visit | Attending: Radiation Oncology | Admitting: Radiation Oncology

## 2015-09-24 DIAGNOSIS — C799 Secondary malignant neoplasm of unspecified site: Secondary | ICD-10-CM | POA: Diagnosis not present

## 2015-09-24 DIAGNOSIS — Z510 Encounter for antineoplastic radiation therapy: Secondary | ICD-10-CM | POA: Diagnosis not present

## 2015-09-24 DIAGNOSIS — C189 Malignant neoplasm of colon, unspecified: Secondary | ICD-10-CM | POA: Diagnosis not present

## 2015-09-24 NOTE — Progress Notes (Signed)
Called Ms. Tamara [name] to inform her that due to exposure by a staff member who was diagnosed with the flu, Dr. Tyler Pita, has ordered. She stated that she is very sensitive to medications and is very cautious regarding medications, therefore she does not feel thatshe should take Tamiflu at this time.  Encouraged to inform the clinical staff if she has a fever, aching, etc...  She stated compliance.

## 2015-09-25 ENCOUNTER — Encounter: Payer: Self-pay | Admitting: Family

## 2015-09-25 ENCOUNTER — Other Ambulatory Visit: Payer: Medicare Other

## 2015-09-25 ENCOUNTER — Ambulatory Visit: Payer: Medicare Other

## 2015-09-25 ENCOUNTER — Ambulatory Visit: Payer: Medicare Other | Admitting: Radiation Oncology

## 2015-09-25 ENCOUNTER — Ambulatory Visit: Payer: Medicare Other | Admitting: Hematology & Oncology

## 2015-09-25 ENCOUNTER — Ambulatory Visit (HOSPITAL_BASED_OUTPATIENT_CLINIC_OR_DEPARTMENT_OTHER): Payer: Medicare Other

## 2015-09-25 ENCOUNTER — Ambulatory Visit (HOSPITAL_BASED_OUTPATIENT_CLINIC_OR_DEPARTMENT_OTHER): Payer: Medicare Other | Admitting: Family

## 2015-09-25 ENCOUNTER — Other Ambulatory Visit (HOSPITAL_BASED_OUTPATIENT_CLINIC_OR_DEPARTMENT_OTHER): Payer: Medicare Other

## 2015-09-25 VITALS — BP 137/55 | HR 85 | Temp 97.8°F | Resp 16 | Ht 65.5 in | Wt 125.0 lb

## 2015-09-25 DIAGNOSIS — C7989 Secondary malignant neoplasm of other specified sites: Secondary | ICD-10-CM

## 2015-09-25 DIAGNOSIS — C7800 Secondary malignant neoplasm of unspecified lung: Secondary | ICD-10-CM | POA: Diagnosis not present

## 2015-09-25 DIAGNOSIS — C189 Malignant neoplasm of colon, unspecified: Secondary | ICD-10-CM

## 2015-09-25 DIAGNOSIS — C7982 Secondary malignant neoplasm of genital organs: Secondary | ICD-10-CM | POA: Diagnosis not present

## 2015-09-25 DIAGNOSIS — C787 Secondary malignant neoplasm of liver and intrahepatic bile duct: Secondary | ICD-10-CM

## 2015-09-25 DIAGNOSIS — C187 Malignant neoplasm of sigmoid colon: Secondary | ICD-10-CM

## 2015-09-25 LAB — COMPREHENSIVE METABOLIC PANEL
ALT: 29 U/L (ref 0–55)
AST: 61 U/L — AB (ref 5–34)
Albumin: 3.6 g/dL (ref 3.5–5.0)
Alkaline Phosphatase: 124 U/L (ref 40–150)
Anion Gap: 10 mEq/L (ref 3–11)
BUN: 43 mg/dL — AB (ref 7.0–26.0)
CALCIUM: 10.1 mg/dL (ref 8.4–10.4)
CHLORIDE: 105 meq/L (ref 98–109)
CO2: 19 mEq/L — ABNORMAL LOW (ref 22–29)
Creatinine: 1.4 mg/dL — ABNORMAL HIGH (ref 0.6–1.1)
EGFR: 35 mL/min/{1.73_m2} — AB (ref >90)
Glucose: 165 mg/dl — ABNORMAL HIGH (ref 70–140)
POTASSIUM: 4.5 meq/L (ref 3.5–5.1)
Sodium: 134 mEq/L — ABNORMAL LOW (ref 136–145)
Total Bilirubin: 0.58 mg/dL (ref 0.20–1.20)
Total Protein: 7.3 g/dL (ref 6.4–8.3)

## 2015-09-25 LAB — CBC WITH DIFFERENTIAL (CANCER CENTER ONLY)
BASO#: 0 10*3/uL (ref 0.0–0.2)
BASO%: 0.3 % (ref 0.0–2.0)
EOS%: 1.3 % (ref 0.0–7.0)
Eosinophils Absolute: 0.1 10*3/uL (ref 0.0–0.5)
HEMATOCRIT: 30.3 % — AB (ref 34.8–46.6)
HGB: 10.6 g/dL — ABNORMAL LOW (ref 11.6–15.9)
LYMPH#: 1 10*3/uL (ref 0.9–3.3)
LYMPH%: 15.2 % (ref 14.0–48.0)
MCH: 36.9 pg — AB (ref 26.0–34.0)
MCHC: 35 g/dL (ref 32.0–36.0)
MCV: 106 fL — ABNORMAL HIGH (ref 81–101)
MONO#: 0.8 10*3/uL (ref 0.1–0.9)
MONO%: 12 % (ref 0.0–13.0)
NEUT#: 4.5 10*3/uL (ref 1.5–6.5)
NEUT%: 71.2 % (ref 39.6–80.0)
Platelets: 135 10*3/uL — ABNORMAL LOW (ref 145–400)
RBC: 2.87 10*6/uL — ABNORMAL LOW (ref 3.70–5.32)
RDW: 12.8 % (ref 11.1–15.7)
WBC: 6.3 10*3/uL (ref 3.9–10.0)

## 2015-09-25 MED ORDER — SODIUM CHLORIDE 0.9 % IV SOLN
1000.0000 mL | Freq: Once | INTRAVENOUS | Status: AC
  Administered 2015-09-25: 13:00:00 via INTRAVENOUS

## 2015-09-25 MED ORDER — SODIUM CHLORIDE 0.9% FLUSH
10.0000 mL | INTRAVENOUS | Status: DC | PRN
  Administered 2015-09-25: 10 mL via INTRAVENOUS
  Filled 2015-09-25: qty 10

## 2015-09-25 MED ORDER — HEPARIN SOD (PORK) LOCK FLUSH 100 UNIT/ML IV SOLN
500.0000 [IU] | Freq: Once | INTRAVENOUS | Status: AC
  Administered 2015-09-25: 500 [IU] via INTRAVENOUS
  Filled 2015-09-25: qty 5

## 2015-09-25 NOTE — Progress Notes (Signed)
Hematology and Oncology Follow Up Visit  Tamara [name] [id number] May 23, 1929 82 y.o. 09/25/2015   Principle Diagnosis:  Recurrent/metastatic colon cancer - progressive metastatic disease involving the lungs, liver, posterior pelvis and uterus  Current Therapy:   Status post palliative radiation therapy to the pelvis for rectal bleeding IV fluids as indicated    Interim History:  Tamara [name] is here today for a post hospital follow-up. She is still feeling a bit week and fatigued. She is still having some pelvic discomfort and feeling like she has an urgency to have a BM. She has also had some clots and tissue coming out of her rectum at times. When she experiences the pain she states that she will have some mild SOB and palpitations She is now on megace 40 mg daily.   She is currently receiving fluids. Her Hgb is stable at 10.6 with an MCV of 106.  Her CEA in July was 1,367. No lymphadenopathy found on exam.  No fever, chills, n/v, cough, rash, dizziness, chest pain, abdominal pain or changes in bladder habits. Her ileostomy continues to function appropriately and she uses lomotil as needed.  No swelling, tenderness, numbness or tingling in her extremities. No c/o joint aches or "bone" pain.  Her appetite comes and goes. She is doing her best to stay hydrated. Her weight is stable.   Medications:    Medication List       Accurate as of 09/25/15  6:36 PM. Always use your most recent med list.          antiseptic oral rinse Liqd 15 mLs by Mouth Rinse route 5 (five) times daily. After brushing teeth   b complex vitamins tablet Take 1 tablet by mouth every other day.   Biotin 5 MG Tabs Take 1 tablet by mouth every morning.   diphenoxylate-atropine 2.5-0.025 MG tablet Commonly known as:  LOMOTIL take 2 tablets by mouth four times a day   feeding supplement (ENSURE COMPLETE) Liqd Take 237 mLs by mouth 2 (two) times daily between meals. *Vanilla*   lidocaine-prilocaine  cream Commonly known as:  EMLA Apply 1 application topically as needed. Apply to port site one hour before access   megestrol 40 MG tablet Commonly known as:  MEGACE Take 1 tablet (40 mg total) by mouth daily.   multivitamin with minerals Tabs tablet Take 1 tablet by mouth daily with breakfast.   prochlorperazine 10 MG tablet Commonly known as:  COMPAZINE Take 1 tablet (10 mg total) by mouth every 6 (six) hours as needed for nausea or vomiting.   SENSODYNE Pste Place 1 application onto teeth 5 (five) times daily.   SUPER B COMPLEX PO Take 1 tablet by mouth every other day. Alternating days with B complex   Vitamin D 2000 units tablet Take 2,000 Units by mouth daily with breakfast.       Allergies:  Allergies  Allergen Reactions  . Codeine Nausea And Vomiting  . Zofran [Ondansetron Hcl] Nausea And Vomiting  . Ciprofloxacin     Confusion/dizziness/fatigue  . Metronidazole     Confusion/dizziness/fatigue  . Skelaxin [Metaxalone]     Too strong  . Tetracycline Hcl     Not tolerated  . Avelox [Moxifloxacin] Rash    Past Medical History, Surgical history, Social history, and Family History were reviewed and updated.  Review of Systems: All other 10 point review of systems is negative.   Physical Exam:  height is 5' 5.5" (1.664 m) and weight is 125 lb (56.7  kg). Her oral temperature is 97.8 F (36.6 C). Her blood pressure is 137/55 (abnormal) and her pulse is 85. Her respiration is 16.   Wt Readings from Last 3 Encounters:  09/25/15 125 lb (56.7 kg)  09/19/15 125 lb (56.7 kg)  09/16/15 125 lb (56.7 kg)    Ocular: Sclerae unicteric, pupils equal, round and reactive to light Ear-nose-throat: Oropharynx clear, dentition fair Lymphatic: No cervical supraclavicular or axillary adenopathy Lungs no rales or rhonchi, good excursion bilaterally Heart regular rate and rhythm, no murmur appreciated Abd soft, nontender, positive bowel sounds, no liver or spleen tip  palpated on exam, no fluid wave  MSK no focal spinal tenderness, no joint edema Neuro: non-focal, well-oriented, appropriate affect Breasts: Deferred  Lab Results  Component Value Date   WBC 6.3 09/25/2015   HGB 10.6 (L) 09/25/2015   HCT 30.3 (L) 09/25/2015   MCV 106 (H) 09/25/2015   PLT 135 (L) 09/25/2015   Lab Results  Component Value Date   FERRITIN 34 03/21/2015   IRON 117 03/21/2015   TIBC 348 03/21/2015   UIBC 230 03/21/2015   IRONPCTSAT 34 03/21/2015   Lab Results  Component Value Date   RBC 2.87 (L) 09/25/2015   No results found for: KPAFRELGTCHN, LAMBDASER, KAPLAMBRATIO No results found for: IGGSERUM, IGA, IGMSERUM No results found for: TOTALPROTELP, ALBUMINELP, A1GS, Tamara [name], GAMS, MSPIKE, SPEI   Chemistry      Component Value Date/Time   NA 134 (L) 09/25/2015 1201   K 4.5 09/25/2015 1201   CL 104 09/22/2015 0500   CL 101 09/16/2015 0907   CO2 19 (L) 09/25/2015 1201   BUN 43.0 (H) 09/25/2015 1201   CREATININE 1.4 (H) 09/25/2015 1201      Component Value Date/Time   CALCIUM 10.1 09/25/2015 1201   ALKPHOS 124 09/25/2015 1201   AST 61 (H) 09/25/2015 1201   ALT 29 09/25/2015 1201   BILITOT 0.58 09/25/2015 1201     Impression and Plan: Tamara [name] is 82 yo white female with metastatic colon cancer now involving the lungs, liver, posterior pelvis and uterus. We will continue to provide supportive care for her. She recently receiving fluids.  She will also received 10 fractions of palliative radiation to the pelvis. This will start tomorrow.  Dr. Marin Olp discussed hospice with the patient. She is quite independent and would like to call them and discuss her care with them personally. The number was given to her before she left.  We will continue to follow along closely with her.  She will contact our office with any questions or concerns. We can see her in sooner if need be.   Eliezer Bottom, NP 8/31/20176:36 PM

## 2015-09-25 NOTE — Patient Instructions (Signed)

## 2015-09-26 ENCOUNTER — Ambulatory Visit
Admission: RE | Admit: 2015-09-26 | Discharge: 2015-09-26 | Disposition: A | Discharge status: Home / Self Care | Payer: Medicare Other | Source: Ambulatory Visit | Attending: Radiation Oncology | Admitting: Radiation Oncology

## 2015-09-26 VITALS — BP 143/49 | HR 73 | Temp 98.4°F | Resp 20 | Wt 125.7 lb

## 2015-09-26 DIAGNOSIS — Z510 Encounter for antineoplastic radiation therapy: Secondary | ICD-10-CM | POA: Diagnosis not present

## 2015-09-26 DIAGNOSIS — C799 Secondary malignant neoplasm of unspecified site: Principal | ICD-10-CM

## 2015-09-26 DIAGNOSIS — C189 Malignant neoplasm of colon, unspecified: Secondary | ICD-10-CM

## 2015-09-26 NOTE — Progress Notes (Signed)
Weekly rad tx, recurrent colon cancer, stoma has some loose soft brown stool, takes lomotil 2 tabs  4x day, appetite fair, no c/o pain or nausea, in w/c, weakness, appetite fair 3:25 PM BP (!) 143/49 (BP Location: Left Arm, Patient Position: Sitting, Cuff Size: Normal)   Pulse 73   Temp 98.4 F (36.9 C) (Oral)   Resp 20   Wt 125 lb 11.2 oz (57 kg)   BMI 20.60 kg/m   Wt Readings from Last 3 Encounters:  09/26/15 125 lb 11.2 oz (57 kg)  09/25/15 125 lb (56.7 kg)  09/19/15 125 lb (56.7 kg)

## 2015-09-26 NOTE — Progress Notes (Signed)
Department of Radiation Oncology  Phone:  [phone] Fax:        [phone]  Weekly Treatment Note    Name: Tamara [name] Date: 09/26/2015 MRN: [id number] DOB: [birth year]   Diagnosis:     ICD-9-CM ICD-10-CM   1. Metastasis from colon cancer (HCC) 199.1 C79.9    153.9 C18.9      Current dose: 12 Gy  Current fraction: 4   MEDICATIONS: Current Outpatient Prescriptions  Medication Sig Dispense Refill  . antiseptic oral rinse (BIOTENE) LIQD 15 mLs by Mouth Rinse route 5 (five) times daily. After brushing teeth    . b complex vitamins tablet Take 1 tablet by mouth every other day.    . B Complex-C (SUPER B COMPLEX PO) Take 1 tablet by mouth every other day. Alternating days with B complex    . Biotin 5 MG TABS Take 1 tablet by mouth every morning.     . Cholecalciferol (VITAMIN D) 2000 UNITS tablet Take 2,000 Units by mouth daily with breakfast.    . Dentifrices (SENSODYNE) PSTE Place 1 application onto teeth 5 (five) times daily.    . diphenoxylate-atropine (LOMOTIL) 2.5-0.025 MG tablet take 2 tablets by mouth four times a day 240 tablet 2  . feeding supplement, ENSURE COMPLETE, (ENSURE COMPLETE) LIQD Take 237 mLs by mouth 2 (two) times daily between meals. *Vanilla*    . lidocaine-prilocaine (EMLA) cream Apply 1 application topically as needed. Apply to port site one hour before access 30 g 0  . megestrol (MEGACE) 40 MG tablet Take 1 tablet (40 mg total) by mouth daily. 20 tablet 0  . Multiple Vitamin (MULTIVITAMIN WITH MINERALS) TABS tablet Take 1 tablet by mouth daily with breakfast.     . prochlorperazine (COMPAZINE) 10 MG tablet Take 1 tablet (10 mg total) by mouth every 6 (six) hours as needed for nausea or vomiting. 30 tablet 0   No current facility-administered medications for this encounter.      ALLERGIES: Codeine; Zofran [ondansetron hcl]; Ciprofloxacin; Metronidazole; Skelaxin [metaxalone]; Tetracycline hcl; and Avelox [moxifloxacin]   LABORATORY DATA:    Lab Results  Component Value Date   WBC 6.3 09/25/2015   HGB 10.6 (L) 09/25/2015   HCT 30.3 (L) 09/25/2015   MCV 106 (H) 09/25/2015   PLT 135 (L) 09/25/2015   Lab Results  Component Value Date   NA 134 (L) 09/25/2015   K 4.5 09/25/2015   CL 104 09/22/2015   CO2 19 (L) 09/25/2015   Lab Results  Component Value Date   ALT 29 09/25/2015   AST 61 (H) 09/25/2015   ALKPHOS 124 09/25/2015   BILITOT 0.58 09/25/2015     NARRATIVE: Tamara [name] was seen today for weekly treatment management. The chart was checked and the patient's films were reviewed.  Weekly rad tx, recurrent colon cancer, stoma has some loose soft brown stool, takes lomotil 2 tabs  4x day, appetite fair, no c/o pain or nausea, in w/c, weakness, appetite fair 7:04 PM BP (!) 143/49 (BP Location: Left Arm, Patient Position: Sitting, Cuff Size: Normal)   Pulse 73   Temp 98.4 F (36.9 C) (Oral)   Resp 20   Wt 125 lb 11.2 oz (57 kg)   BMI 20.60 kg/m   Wt Readings from Last 3 Encounters:  09/26/15 125 lb 11.2 oz (57 kg)  09/25/15 125 lb (56.7 kg)  09/19/15 125 lb (56.7 kg)    PHYSICAL EXAMINATION: weight is 125 lb 11.2 oz (57 kg). Her oral  temperature is 98.4 F (36.9 C). Her blood pressure is 143/49 (abnormal) and her pulse is 73. Her respiration is 20.        ASSESSMENT: The patient is doing satisfactorily with treatment.  PLAN: We will continue with the patient's radiation treatment as planned.

## 2015-09-29 DIAGNOSIS — C7982 Secondary malignant neoplasm of genital organs: Secondary | ICD-10-CM | POA: Insufficient documentation

## 2015-09-29 DIAGNOSIS — C801 Malignant (primary) neoplasm, unspecified: Secondary | ICD-10-CM

## 2015-09-29 NOTE — Addendum Note (Signed)
Encounter addended by: Kyung Rudd, MD on: 09/29/2015  7:34 PM<BR>    Actions taken: Visit diagnoses modified, Problem List modified, Sign clinical note

## 2015-09-30 ENCOUNTER — Ambulatory Visit
Admission: RE | Admit: 2015-09-30 | Discharge: 2015-09-30 | Disposition: A | Discharge status: Home / Self Care | Payer: Medicare Other | Source: Ambulatory Visit | Attending: Radiation Oncology | Admitting: Radiation Oncology

## 2015-09-30 DIAGNOSIS — Z510 Encounter for antineoplastic radiation therapy: Secondary | ICD-10-CM | POA: Diagnosis not present

## 2015-09-30 DIAGNOSIS — C189 Malignant neoplasm of colon, unspecified: Secondary | ICD-10-CM | POA: Diagnosis not present

## 2015-09-30 DIAGNOSIS — C799 Secondary malignant neoplasm of unspecified site: Secondary | ICD-10-CM | POA: Diagnosis not present

## 2015-10-01 ENCOUNTER — Ambulatory Visit
Admission: RE | Admit: 2015-10-01 | Discharge: 2015-10-01 | Disposition: A | Discharge status: Home / Self Care | Payer: Medicare Other | Source: Ambulatory Visit | Attending: Radiation Oncology | Admitting: Radiation Oncology

## 2015-10-01 ENCOUNTER — Encounter: Payer: Self-pay | Admitting: Radiation Oncology

## 2015-10-01 VITALS — BP 128/65 | HR 85 | Temp 98.0°F | Resp 20 | Wt 125.6 lb

## 2015-10-01 DIAGNOSIS — C801 Malignant (primary) neoplasm, unspecified: Principal | ICD-10-CM

## 2015-10-01 DIAGNOSIS — Z510 Encounter for antineoplastic radiation therapy: Secondary | ICD-10-CM | POA: Diagnosis not present

## 2015-10-01 DIAGNOSIS — C799 Secondary malignant neoplasm of unspecified site: Secondary | ICD-10-CM | POA: Diagnosis not present

## 2015-10-01 DIAGNOSIS — C7982 Secondary malignant neoplasm of genital organs: Secondary | ICD-10-CM

## 2015-10-01 DIAGNOSIS — C189 Malignant neoplasm of colon, unspecified: Secondary | ICD-10-CM | POA: Diagnosis not present

## 2015-10-01 NOTE — Progress Notes (Signed)
Department of Radiation Oncology  Phone:  [phone] Fax:        [phone]  Weekly Treatment Note    Name: Tamara [name] Date: 10/01/2015 MRN: [id number] DOB: January 23, 1930   Diagnosis:     ICD-9-CM ICD-10-CM   1. Metastasis to uterus (HCC) 198.82 C79.82    199.1 C80.1      Current dose: 18 Gy  Current fraction: 6   MEDICATIONS: Current Outpatient Prescriptions  Medication Sig Dispense Refill  . antiseptic oral rinse (BIOTENE) LIQD 15 mLs by Mouth Rinse route 5 (five) times daily. After brushing teeth    . b complex vitamins tablet Take 1 tablet by mouth every other day.    . B Complex-C (SUPER B COMPLEX PO) Take 1 tablet by mouth every other day. Alternating days with B complex    . Biotin 5 MG TABS Take 1 tablet by mouth every morning.     . Cholecalciferol (VITAMIN D) 2000 UNITS tablet Take 2,000 Units by mouth daily with breakfast.    . Dentifrices (SENSODYNE) PSTE Place 1 application onto teeth 5 (five) times daily.    . diphenoxylate-atropine (LOMOTIL) 2.5-0.025 MG tablet take 2 tablets by mouth four times a day 240 tablet 2  . feeding supplement, ENSURE COMPLETE, (ENSURE COMPLETE) LIQD Take 237 mLs by mouth 2 (two) times daily between meals. *Vanilla*    . lidocaine-prilocaine (EMLA) cream Apply 1 application topically as needed. Apply to port site one hour before access 30 g 0  . megestrol (MEGACE) 40 MG tablet Take 1 tablet (40 mg total) by mouth daily. 20 tablet 0  . Multiple Vitamin (MULTIVITAMIN WITH MINERALS) TABS tablet Take 1 tablet by mouth daily with breakfast.     . prochlorperazine (COMPAZINE) 10 MG tablet Take 1 tablet (10 mg total) by mouth every 6 (six) hours as needed for nausea or vomiting. 30 tablet 0   No current facility-administered medications for this encounter.      ALLERGIES: Codeine; Zofran [ondansetron hcl]; Ciprofloxacin; Metronidazole; Skelaxin [metaxalone]; Tetracycline hcl; and Avelox [moxifloxacin]   LABORATORY DATA:  Lab  Results  Component Value Date   WBC 6.3 09/25/2015   HGB 10.6 (L) 09/25/2015   HCT 30.3 (L) 09/25/2015   MCV 106 (H) 09/25/2015   PLT 135 (L) 09/25/2015   Lab Results  Component Value Date   NA 134 (L) 09/25/2015   K 4.5 09/25/2015   CL 104 09/22/2015   CO2 19 (L) 09/25/2015   Lab Results  Component Value Date   ALT 29 09/25/2015   AST 61 (H) 09/25/2015   ALKPHOS 124 09/25/2015   BILITOT 0.58 09/25/2015     NARRATIVE: Tamara [name] was seen today for weekly treatment management. The chart was checked and the patient's films were reviewed.  Weekly rad tx pelvis 6/10 completed, has a colostomy , normal stool, appetite fair, taste  Not good,  No pain, or nausea, in w/c, fatigued, forces herself to eat, staying hydrated with water satted 6:32 PM BP 128/65 (BP Location: Left Arm, Patient Position: Sitting, Cuff Size: Normal)   Pulse 85   Temp 98 F (36.7 C) (Oral)   Resp 20   Wt 125 lb 9.6 oz (57 kg)   BMI 20.58 kg/m   Wt Readings from Last 3 Encounters:  10/01/15 125 lb 9.6 oz (57 kg)  09/26/15 125 lb 11.2 oz (57 kg)  09/25/15 125 lb (56.7 kg)    PHYSICAL EXAMINATION: weight is 125 lb 9.6 oz (57 kg).  Her oral temperature is 98 F (36.7 C). Her blood pressure is 128/65 and her pulse is 85. Her respiration is 20.        ASSESSMENT: The patient is doing satisfactorily with treatment. She denies any further bleeding currently. Overall has been doing reasonably well with her course of radiation treatment.  PLAN: We will continue with the patient's radiation treatment as planned.

## 2015-10-01 NOTE — Progress Notes (Signed)
Weekly rad tx pelvis 6/10 completed, has a colostomy , normal stool, appetite fair, taste  Not good,  No pain, or nausea, in w/c, fatigued, forces herself to eat, staying hydrated with water satted 2:19 PM BP 128/65 (BP Location: Left Arm, Patient Position: Sitting, Cuff Size: Normal)   Pulse 85   Temp 98 F (36.7 C) (Oral)   Resp 20   Wt 125 lb 9.6 oz (57 kg)   BMI 20.58 kg/m   Wt Readings from Last 3 Encounters:  10/01/15 125 lb 9.6 oz (57 kg)  09/26/15 125 lb 11.2 oz (57 kg)  09/25/15 125 lb (56.7 kg)

## 2015-10-02 ENCOUNTER — Ambulatory Visit: Payer: Medicare Other

## 2015-10-02 ENCOUNTER — Other Ambulatory Visit: Payer: Medicare Other

## 2015-10-02 ENCOUNTER — Other Ambulatory Visit (HOSPITAL_BASED_OUTPATIENT_CLINIC_OR_DEPARTMENT_OTHER): Payer: Medicare Other

## 2015-10-02 ENCOUNTER — Ambulatory Visit: Payer: Medicare Other | Admitting: Family

## 2015-10-02 ENCOUNTER — Ambulatory Visit (HOSPITAL_BASED_OUTPATIENT_CLINIC_OR_DEPARTMENT_OTHER): Payer: Medicare Other

## 2015-10-02 ENCOUNTER — Ambulatory Visit
Admission: RE | Admit: 2015-10-02 | Discharge: 2015-10-02 | Disposition: A | Discharge status: Home / Self Care | Payer: Medicare Other | Source: Ambulatory Visit | Attending: Radiation Oncology | Admitting: Radiation Oncology

## 2015-10-02 VITALS — BP 151/61 | HR 84 | Temp 97.6°F | Resp 20

## 2015-10-02 DIAGNOSIS — C7982 Secondary malignant neoplasm of genital organs: Secondary | ICD-10-CM | POA: Diagnosis not present

## 2015-10-02 DIAGNOSIS — C7800 Secondary malignant neoplasm of unspecified lung: Secondary | ICD-10-CM

## 2015-10-02 DIAGNOSIS — E860 Dehydration: Secondary | ICD-10-CM

## 2015-10-02 DIAGNOSIS — C189 Malignant neoplasm of colon, unspecified: Secondary | ICD-10-CM | POA: Diagnosis not present

## 2015-10-02 DIAGNOSIS — C787 Secondary malignant neoplasm of liver and intrahepatic bile duct: Secondary | ICD-10-CM

## 2015-10-02 DIAGNOSIS — C799 Secondary malignant neoplasm of unspecified site: Secondary | ICD-10-CM | POA: Diagnosis not present

## 2015-10-02 DIAGNOSIS — Z510 Encounter for antineoplastic radiation therapy: Secondary | ICD-10-CM | POA: Diagnosis not present

## 2015-10-02 DIAGNOSIS — C7989 Secondary malignant neoplasm of other specified sites: Secondary | ICD-10-CM | POA: Diagnosis not present

## 2015-10-02 LAB — CBC WITH DIFFERENTIAL (CANCER CENTER ONLY)
BASO#: 0 10*3/uL (ref 0.0–0.2)
BASO%: 0.2 % (ref 0.0–2.0)
EOS ABS: 0.3 10*3/uL (ref 0.0–0.5)
EOS%: 4.3 % (ref 0.0–7.0)
HEMATOCRIT: 30 % — AB (ref 34.8–46.6)
HEMOGLOBIN: 10.5 g/dL — AB (ref 11.6–15.9)
LYMPH#: 0.7 10*3/uL — AB (ref 0.9–3.3)
LYMPH%: 11.8 % — ABNORMAL LOW (ref 14.0–48.0)
MCH: 36.7 pg — AB (ref 26.0–34.0)
MCHC: 35 g/dL (ref 32.0–36.0)
MCV: 105 fL — AB (ref 81–101)
MONO#: 0.8 10*3/uL (ref 0.1–0.9)
MONO%: 14.1 % — ABNORMAL HIGH (ref 0.0–13.0)
NEUT%: 69.6 % (ref 39.6–80.0)
NEUTROS ABS: 4.1 10*3/uL (ref 1.5–6.5)
PLATELETS: 137 10*3/uL — AB (ref 145–400)
RBC: 2.86 10*6/uL — AB (ref 3.70–5.32)
RDW: 12.9 % (ref 11.1–15.7)
WBC: 5.8 10*3/uL (ref 3.9–10.0)

## 2015-10-02 LAB — CMP (CANCER CENTER ONLY)
ALBUMIN: 3.5 g/dL (ref 3.3–5.5)
ALK PHOS: 105 U/L — AB (ref 26–84)
ALT: 33 U/L (ref 10–47)
AST: 62 U/L — ABNORMAL HIGH (ref 11–38)
BUN: 29 mg/dL — AB (ref 7–22)
CHLORIDE: 103 meq/L (ref 98–108)
CO2: 24 mEq/L (ref 18–33)
CREATININE: 1.3 mg/dL — AB (ref 0.6–1.2)
Calcium: 9.9 mg/dL (ref 8.0–10.3)
Glucose, Bld: 113 mg/dL (ref 73–118)
POTASSIUM: 4.2 meq/L (ref 3.3–4.7)
Sodium: 133 mEq/L (ref 128–145)
TOTAL PROTEIN: 7.4 g/dL (ref 6.4–8.1)
Total Bilirubin: 0.9 mg/dl (ref 0.20–1.60)

## 2015-10-02 MED ORDER — SODIUM CHLORIDE 0.9 % IV SOLN
Freq: Once | INTRAVENOUS | Status: AC
  Administered 2015-10-02: 11:00:00 via INTRAVENOUS

## 2015-10-02 MED ORDER — SODIUM CHLORIDE 0.9 % IJ SOLN
10.0000 mL | INTRAMUSCULAR | Status: DC | PRN
  Administered 2015-10-02: 10 mL
  Filled 2015-10-02: qty 10

## 2015-10-02 MED ORDER — HEPARIN SOD (PORK) LOCK FLUSH 100 UNIT/ML IV SOLN
500.0000 [IU] | Freq: Once | INTRAVENOUS | Status: AC | PRN
  Administered 2015-10-02: 500 [IU]
  Filled 2015-10-02: qty 5

## 2015-10-02 NOTE — Patient Instructions (Signed)

## 2015-10-03 ENCOUNTER — Ambulatory Visit
Admission: RE | Admit: 2015-10-03 | Discharge: 2015-10-03 | Disposition: A | Discharge status: Home / Self Care | Payer: Medicare Other | Source: Ambulatory Visit | Attending: Radiation Oncology | Admitting: Radiation Oncology

## 2015-10-03 DIAGNOSIS — C799 Secondary malignant neoplasm of unspecified site: Secondary | ICD-10-CM | POA: Diagnosis not present

## 2015-10-03 DIAGNOSIS — C189 Malignant neoplasm of colon, unspecified: Secondary | ICD-10-CM | POA: Diagnosis not present

## 2015-10-03 DIAGNOSIS — Z510 Encounter for antineoplastic radiation therapy: Secondary | ICD-10-CM | POA: Diagnosis not present

## 2015-10-06 ENCOUNTER — Ambulatory Visit: Payer: Medicare Other

## 2015-10-06 ENCOUNTER — Ambulatory Visit
Admission: RE | Admit: 2015-10-06 | Discharge: 2015-10-06 | Disposition: A | Discharge status: Home / Self Care | Payer: Medicare Other | Source: Ambulatory Visit | Attending: Radiation Oncology | Admitting: Radiation Oncology

## 2015-10-06 DIAGNOSIS — C799 Secondary malignant neoplasm of unspecified site: Secondary | ICD-10-CM | POA: Diagnosis not present

## 2015-10-06 DIAGNOSIS — Z510 Encounter for antineoplastic radiation therapy: Secondary | ICD-10-CM | POA: Diagnosis not present

## 2015-10-06 DIAGNOSIS — C189 Malignant neoplasm of colon, unspecified: Secondary | ICD-10-CM | POA: Diagnosis not present

## 2015-10-07 ENCOUNTER — Ambulatory Visit: Payer: Medicare Other

## 2015-10-07 ENCOUNTER — Encounter: Payer: Self-pay | Admitting: *Deleted

## 2015-10-07 ENCOUNTER — Encounter: Payer: Self-pay | Admitting: Radiation Oncology

## 2015-10-07 ENCOUNTER — Ambulatory Visit
Admission: RE | Admit: 2015-10-07 | Discharge: 2015-10-07 | Disposition: A | Discharge status: Home / Self Care | Payer: Medicare Other | Source: Ambulatory Visit | Attending: Radiation Oncology | Admitting: Radiation Oncology

## 2015-10-07 DIAGNOSIS — C799 Secondary malignant neoplasm of unspecified site: Secondary | ICD-10-CM | POA: Diagnosis not present

## 2015-10-07 DIAGNOSIS — C189 Malignant neoplasm of colon, unspecified: Secondary | ICD-10-CM | POA: Diagnosis not present

## 2015-10-07 DIAGNOSIS — Z510 Encounter for antineoplastic radiation therapy: Secondary | ICD-10-CM | POA: Diagnosis not present

## 2015-10-07 NOTE — Progress Notes (Signed)
Patient completed today, , failed to come see MD yesterday, no c/o voiced, asked Shona Simpson, PA if patient really needed to be seen ,she is good in the block of 5," NO,she doesn't need to be seen per Bryson Ha, [place]" patient has her f/u appt 11/25/15 with our PA Shona Simpson 3:48 PM

## 2015-10-08 ENCOUNTER — Ambulatory Visit: Payer: Medicare Other

## 2015-10-08 DIAGNOSIS — C189 Malignant neoplasm of colon, unspecified: Secondary | ICD-10-CM | POA: Diagnosis not present

## 2015-10-08 DIAGNOSIS — N939 Abnormal uterine and vaginal bleeding, unspecified: Secondary | ICD-10-CM | POA: Diagnosis not present

## 2015-10-08 DIAGNOSIS — I10 Essential (primary) hypertension: Secondary | ICD-10-CM | POA: Diagnosis not present

## 2015-10-08 DIAGNOSIS — R5383 Other fatigue: Secondary | ICD-10-CM | POA: Diagnosis not present

## 2015-10-09 ENCOUNTER — Ambulatory Visit: Payer: Medicare Other

## 2015-10-09 NOTE — Progress Notes (Incomplete)
°  Radiation Oncology         [phone] ________________________________  Name: Tamara [name] MRN: [id number]  Date: 10/07/2015  DOB: March 19, 1929  End of Treatment Note  Diagnosis:      ICD-9-CM ICD-10-CM   1. Metastasis to uterus [facility]) 198.82 C79.82    199.1 C80.1        Indication for treatment:  Curative       Radiation treatment dates:   09/22/3015 to 10/07/2015  Site/dose:   The pelvis was treated to 30 Gy in 10 fractions at 3 Gy per fraction.   Beams/energy:   3D // 15X  Narrative: The patient tolerated radiation treatment relatively The patient developed mild fatigue with treatment.   Plan: The patient has completed radiation treatment. The patient will return to radiation oncology clinic for routine followup in one month. I advised them to call or return sooner if they have any questions or concerns related to their recovery or treatment.  ------------------------------------------------  Jodelle Gross, MD, PhD  This document serves as a record of services personally performed by Kyung Rudd, MD. It was created on his behalf by Arlyce Harman, a trained medical scribe. The creation of this record is based on the scribe's personal observations and the provider's statements to them. This document has been checked and approved by the attending provider.

## 2015-10-10 ENCOUNTER — Ambulatory Visit: Payer: Medicare Other

## 2015-10-13 ENCOUNTER — Ambulatory Visit: Payer: Medicare Other

## 2015-10-20 ENCOUNTER — Other Ambulatory Visit: Payer: Self-pay | Admitting: Family

## 2015-10-20 ENCOUNTER — Telehealth: Payer: Self-pay | Admitting: *Deleted

## 2015-10-20 ENCOUNTER — Ambulatory Visit (HOSPITAL_BASED_OUTPATIENT_CLINIC_OR_DEPARTMENT_OTHER): Payer: Medicare Other

## 2015-10-20 VITALS — BP 152/55 | HR 69 | Temp 98.2°F | Resp 16

## 2015-10-20 DIAGNOSIS — C7989 Secondary malignant neoplasm of other specified sites: Secondary | ICD-10-CM | POA: Diagnosis not present

## 2015-10-20 DIAGNOSIS — C7982 Secondary malignant neoplasm of genital organs: Secondary | ICD-10-CM | POA: Diagnosis not present

## 2015-10-20 DIAGNOSIS — C787 Secondary malignant neoplasm of liver and intrahepatic bile duct: Secondary | ICD-10-CM | POA: Diagnosis not present

## 2015-10-20 DIAGNOSIS — E860 Dehydration: Secondary | ICD-10-CM | POA: Diagnosis present

## 2015-10-20 DIAGNOSIS — C189 Malignant neoplasm of colon, unspecified: Secondary | ICD-10-CM | POA: Diagnosis not present

## 2015-10-20 DIAGNOSIS — C7800 Secondary malignant neoplasm of unspecified lung: Secondary | ICD-10-CM

## 2015-10-20 DIAGNOSIS — R197 Diarrhea, unspecified: Secondary | ICD-10-CM

## 2015-10-20 MED ORDER — LOPERAMIDE HCL 2 MG PO CAPS
4.0000 mg | ORAL_CAPSULE | Freq: Once | ORAL | Status: AC
  Administered 2015-10-20: 4 mg via ORAL

## 2015-10-20 MED ORDER — SODIUM CHLORIDE 0.9 % IJ SOLN
10.0000 mL | INTRAMUSCULAR | Status: DC | PRN
  Administered 2015-10-20: 10 mL
  Filled 2015-10-20: qty 10

## 2015-10-20 MED ORDER — DIPHENOXYLATE-ATROPINE 2.5-0.025 MG PO TABS
2.0000 | ORAL_TABLET | Freq: Once | ORAL | Status: AC
  Administered 2015-10-20: 2 via ORAL

## 2015-10-20 MED ORDER — SODIUM CHLORIDE 0.9 % IV SOLN
Freq: Once | INTRAVENOUS | Status: AC
  Administered 2015-10-20: 12:00:00 via INTRAVENOUS

## 2015-10-20 MED ORDER — DIPHENOXYLATE-ATROPINE 2.5-0.025 MG PO TABS
ORAL_TABLET | ORAL | Status: AC
  Filled 2015-10-20: qty 2

## 2015-10-20 MED ORDER — HEPARIN SOD (PORK) LOCK FLUSH 100 UNIT/ML IV SOLN
500.0000 [IU] | Freq: Once | INTRAVENOUS | Status: AC | PRN
  Administered 2015-10-20: 500 [IU]
  Filled 2015-10-20: qty 5

## 2015-10-20 MED ORDER — LOPERAMIDE HCL 2 MG PO CAPS
ORAL_CAPSULE | ORAL | Status: AC
  Filled 2015-10-20: qty 2

## 2015-10-20 NOTE — Patient Instructions (Signed)

## 2015-10-20 NOTE — Telephone Encounter (Signed)
Patient called stating she's had diarrhea this weekend and would like to come in for fluids. Dr Marin Olp okay with this plan. Patient aware to come in today at 1130a for fluids.

## 2015-10-22 DIAGNOSIS — R5383 Other fatigue: Secondary | ICD-10-CM | POA: Diagnosis not present

## 2015-10-22 DIAGNOSIS — N939 Abnormal uterine and vaginal bleeding, unspecified: Secondary | ICD-10-CM | POA: Diagnosis not present

## 2015-10-22 DIAGNOSIS — C189 Malignant neoplasm of colon, unspecified: Secondary | ICD-10-CM | POA: Diagnosis not present

## 2015-10-22 DIAGNOSIS — R609 Edema, unspecified: Secondary | ICD-10-CM | POA: Diagnosis not present

## 2015-10-22 DIAGNOSIS — R350 Frequency of micturition: Secondary | ICD-10-CM | POA: Diagnosis not present

## 2015-11-02 NOTE — Progress Notes (Signed)
  Radiation Oncology         [phone] ________________________________  Name: Tamara [name] MRN: [id number]  Date: 10/07/2015  DOB: 03-06-1929  End of Treatment Note  Diagnosis:   Recurrent colorectal cancer     Indication for treatment::  palliative       Radiation treatment dates:   09/22/2015 through 10/07/2015  Site/dose:   The patient was treated to the pelvis to a total dose of 30 gray in 10 fractions at 3 gray per fraction using a 4 field technique.  Narrative: The patient tolerated radiation treatment relatively well.   No significant acute toxicity was seen during the patient's course of radiation treatment.  Plan: The patient has completed radiation treatment. The patient will return to radiation oncology clinic for routine followup in one month. I advised the patient to call or return sooner if they have any questions or concerns related to their recovery or treatment. ________________________________  Jodelle Gross, M.D., Ph.D.

## 2015-11-04 ENCOUNTER — Ambulatory Visit (HOSPITAL_BASED_OUTPATIENT_CLINIC_OR_DEPARTMENT_OTHER): Payer: Medicare Other

## 2015-11-04 VITALS — BP 135/48 | HR 70 | Temp 97.6°F | Resp 16

## 2015-11-04 DIAGNOSIS — E860 Dehydration: Secondary | ICD-10-CM | POA: Diagnosis present

## 2015-11-04 DIAGNOSIS — C189 Malignant neoplasm of colon, unspecified: Secondary | ICD-10-CM | POA: Diagnosis not present

## 2015-11-04 DIAGNOSIS — C7989 Secondary malignant neoplasm of other specified sites: Secondary | ICD-10-CM

## 2015-11-04 DIAGNOSIS — C7800 Secondary malignant neoplasm of unspecified lung: Secondary | ICD-10-CM | POA: Diagnosis not present

## 2015-11-04 DIAGNOSIS — C787 Secondary malignant neoplasm of liver and intrahepatic bile duct: Secondary | ICD-10-CM

## 2015-11-04 DIAGNOSIS — C7982 Secondary malignant neoplasm of genital organs: Secondary | ICD-10-CM

## 2015-11-04 MED ORDER — SODIUM CHLORIDE 0.9 % IV SOLN
Freq: Once | INTRAVENOUS | Status: AC
  Administered 2015-11-04: 12:00:00 via INTRAVENOUS

## 2015-11-04 MED ORDER — SODIUM CHLORIDE 0.9 % IJ SOLN
10.0000 mL | INTRAMUSCULAR | Status: DC | PRN
  Administered 2015-11-04: 10 mL
  Filled 2015-11-04: qty 10

## 2015-11-04 MED ORDER — HEPARIN SOD (PORK) LOCK FLUSH 100 UNIT/ML IV SOLN
500.0000 [IU] | Freq: Once | INTRAVENOUS | Status: AC | PRN
Start: 2015-11-04 — End: 2015-11-04
  Administered 2015-11-04: 500 [IU]
  Filled 2015-11-04: qty 5

## 2015-11-04 NOTE — Patient Instructions (Signed)

## 2015-11-06 DIAGNOSIS — R609 Edema, unspecified: Secondary | ICD-10-CM | POA: Diagnosis not present

## 2015-11-06 DIAGNOSIS — N390 Urinary tract infection, site not specified: Secondary | ICD-10-CM | POA: Diagnosis not present

## 2015-11-06 DIAGNOSIS — C189 Malignant neoplasm of colon, unspecified: Secondary | ICD-10-CM | POA: Diagnosis not present

## 2015-11-07 DIAGNOSIS — C7800 Secondary malignant neoplasm of unspecified lung: Secondary | ICD-10-CM | POA: Diagnosis not present

## 2015-11-07 DIAGNOSIS — C786 Secondary malignant neoplasm of retroperitoneum and peritoneum: Secondary | ICD-10-CM | POA: Diagnosis not present

## 2015-11-07 DIAGNOSIS — C189 Malignant neoplasm of colon, unspecified: Secondary | ICD-10-CM | POA: Diagnosis not present

## 2015-11-07 DIAGNOSIS — I10 Essential (primary) hypertension: Secondary | ICD-10-CM | POA: Diagnosis not present

## 2015-11-07 DIAGNOSIS — C787 Secondary malignant neoplasm of liver and intrahepatic bile duct: Secondary | ICD-10-CM | POA: Diagnosis not present

## 2015-11-07 DIAGNOSIS — I2510 Atherosclerotic heart disease of native coronary artery without angina pectoris: Secondary | ICD-10-CM | POA: Diagnosis not present

## 2015-11-10 DIAGNOSIS — C7800 Secondary malignant neoplasm of unspecified lung: Secondary | ICD-10-CM | POA: Diagnosis not present

## 2015-11-10 DIAGNOSIS — C787 Secondary malignant neoplasm of liver and intrahepatic bile duct: Secondary | ICD-10-CM | POA: Diagnosis not present

## 2015-11-10 DIAGNOSIS — C786 Secondary malignant neoplasm of retroperitoneum and peritoneum: Secondary | ICD-10-CM | POA: Diagnosis not present

## 2015-11-10 DIAGNOSIS — C189 Malignant neoplasm of colon, unspecified: Secondary | ICD-10-CM | POA: Diagnosis not present

## 2015-11-10 DIAGNOSIS — I10 Essential (primary) hypertension: Secondary | ICD-10-CM | POA: Diagnosis not present

## 2015-11-10 DIAGNOSIS — I2510 Atherosclerotic heart disease of native coronary artery without angina pectoris: Secondary | ICD-10-CM | POA: Diagnosis not present

## 2015-11-11 DIAGNOSIS — B964 Proteus (mirabilis) (morganii) as the cause of diseases classified elsewhere: Secondary | ICD-10-CM | POA: Diagnosis not present

## 2015-11-11 DIAGNOSIS — R609 Edema, unspecified: Secondary | ICD-10-CM | POA: Diagnosis not present

## 2015-11-11 DIAGNOSIS — N390 Urinary tract infection, site not specified: Secondary | ICD-10-CM | POA: Diagnosis not present

## 2015-11-11 DIAGNOSIS — C189 Malignant neoplasm of colon, unspecified: Secondary | ICD-10-CM | POA: Diagnosis not present

## 2015-11-11 DIAGNOSIS — R110 Nausea: Secondary | ICD-10-CM | POA: Diagnosis not present

## 2015-11-13 DIAGNOSIS — C786 Secondary malignant neoplasm of retroperitoneum and peritoneum: Secondary | ICD-10-CM | POA: Diagnosis not present

## 2015-11-13 DIAGNOSIS — I2510 Atherosclerotic heart disease of native coronary artery without angina pectoris: Secondary | ICD-10-CM | POA: Diagnosis not present

## 2015-11-13 DIAGNOSIS — C189 Malignant neoplasm of colon, unspecified: Secondary | ICD-10-CM | POA: Diagnosis not present

## 2015-11-13 DIAGNOSIS — C7800 Secondary malignant neoplasm of unspecified lung: Secondary | ICD-10-CM | POA: Diagnosis not present

## 2015-11-13 DIAGNOSIS — C787 Secondary malignant neoplasm of liver and intrahepatic bile duct: Secondary | ICD-10-CM | POA: Diagnosis not present

## 2015-11-13 DIAGNOSIS — I10 Essential (primary) hypertension: Secondary | ICD-10-CM | POA: Diagnosis not present

## 2015-11-20 DIAGNOSIS — I10 Essential (primary) hypertension: Secondary | ICD-10-CM | POA: Diagnosis not present

## 2015-11-20 DIAGNOSIS — C189 Malignant neoplasm of colon, unspecified: Secondary | ICD-10-CM | POA: Diagnosis not present

## 2015-11-20 DIAGNOSIS — C786 Secondary malignant neoplasm of retroperitoneum and peritoneum: Secondary | ICD-10-CM | POA: Diagnosis not present

## 2015-11-20 DIAGNOSIS — C787 Secondary malignant neoplasm of liver and intrahepatic bile duct: Secondary | ICD-10-CM | POA: Diagnosis not present

## 2015-11-20 DIAGNOSIS — I2510 Atherosclerotic heart disease of native coronary artery without angina pectoris: Secondary | ICD-10-CM | POA: Diagnosis not present

## 2015-11-20 DIAGNOSIS — C7800 Secondary malignant neoplasm of unspecified lung: Secondary | ICD-10-CM | POA: Diagnosis not present

## 2015-11-25 ENCOUNTER — Ambulatory Visit
Admission: RE | Admit: 2015-11-25 | Discharge: 2015-11-25 | Disposition: A | Discharge status: Home / Self Care | Source: Ambulatory Visit | Attending: Radiation Oncology | Admitting: Radiation Oncology

## 2015-11-25 DIAGNOSIS — C19 Malignant neoplasm of rectosigmoid junction: Secondary | ICD-10-CM | POA: Insufficient documentation

## 2015-11-25 DIAGNOSIS — C801 Malignant (primary) neoplasm, unspecified: Secondary | ICD-10-CM

## 2015-11-25 DIAGNOSIS — Z885 Allergy status to narcotic agent status: Secondary | ICD-10-CM | POA: Diagnosis not present

## 2015-11-25 DIAGNOSIS — C7982 Secondary malignant neoplasm of genital organs: Secondary | ICD-10-CM

## 2015-11-25 DIAGNOSIS — Z933 Colostomy status: Secondary | ICD-10-CM | POA: Diagnosis not present

## 2015-11-25 DIAGNOSIS — Z923 Personal history of irradiation: Secondary | ICD-10-CM | POA: Insufficient documentation

## 2015-11-25 DIAGNOSIS — C55 Malignant neoplasm of uterus, part unspecified: Secondary | ICD-10-CM | POA: Insufficient documentation

## 2015-11-25 DIAGNOSIS — Z888 Allergy status to other drugs, medicaments and biological substances status: Secondary | ICD-10-CM | POA: Diagnosis not present

## 2015-11-25 DIAGNOSIS — Z79899 Other long term (current) drug therapy: Secondary | ICD-10-CM | POA: Diagnosis not present

## 2015-11-25 DIAGNOSIS — N939 Abnormal uterine and vaginal bleeding, unspecified: Secondary | ICD-10-CM | POA: Insufficient documentation

## 2015-11-25 DIAGNOSIS — D500 Iron deficiency anemia secondary to blood loss (chronic): Secondary | ICD-10-CM | POA: Diagnosis not present

## 2015-11-25 NOTE — Progress Notes (Addendum)
Tamara [name] reports lost of appetite, and low energy level.  Also report that her food does not taste good. Drinking Ensure 1 can BID, but has been encouraged by her PCP have an intake of Ensure TID.  Intermittent episodes of diarrhea.  Reports fatigue.  Colostomy intact.  VSS.  BP (!) 147/63 (BP Location: Right Arm, Patient Position: Sitting, Cuff Size: Normal)   Pulse 70   Temp 98.2 F (36.8 C) (Oral)   Resp 16   Ht 5' 5.5" (1.664 m)   Wt 127 lb 3.2 oz (57.7 kg)   SpO2 100%   BMI 20.85 kg/m    Wt Readings from Last 3 Encounters:  11/25/15 127 lb 3.2 oz (57.7 kg)  10/01/15 125 lb 9.6 oz (57 kg)  09/26/15 125 lb 11.2 oz (57 kg)

## 2015-11-26 ENCOUNTER — Telehealth: Payer: Self-pay | Admitting: *Deleted

## 2015-11-26 DIAGNOSIS — C7800 Secondary malignant neoplasm of unspecified lung: Secondary | ICD-10-CM | POA: Diagnosis not present

## 2015-11-26 DIAGNOSIS — C786 Secondary malignant neoplasm of retroperitoneum and peritoneum: Secondary | ICD-10-CM | POA: Diagnosis not present

## 2015-11-26 DIAGNOSIS — I2510 Atherosclerotic heart disease of native coronary artery without angina pectoris: Secondary | ICD-10-CM | POA: Diagnosis not present

## 2015-11-26 DIAGNOSIS — C787 Secondary malignant neoplasm of liver and intrahepatic bile duct: Secondary | ICD-10-CM | POA: Diagnosis not present

## 2015-11-26 DIAGNOSIS — I10 Essential (primary) hypertension: Secondary | ICD-10-CM | POA: Diagnosis not present

## 2015-11-26 DIAGNOSIS — C189 Malignant neoplasm of colon, unspecified: Secondary | ICD-10-CM | POA: Diagnosis not present

## 2015-11-26 NOTE — Telephone Encounter (Signed)
MAILED PATIENT ENSURE COUPONS ON 11-26-15

## 2015-11-28 NOTE — Progress Notes (Signed)
Radiation Oncology         [phone] ________________________________  Name: Tamara [name] MRN: [id number]  Date: 11/25/2015  DOB: [birth year]  Post Treatment Note  CC: Henrine Screws, MD  Volanda Napoleon, MD  Diagnosis:  Recurrent colorectal cancer with metastatic disease to the uterus resulting in vaginal and rectal bleeding.  Interval Since Last Radiation:  7 weeks   09/22/3015 to 10/07/2015: The pelvis was treated to 30 Gy in 10 fractions at 3 Gy per fraction.   01/15/2015-02/05/2015: 35 Gy to the Pelvis/Hartmann's pouch area  Narrative:  The patient returns today for routine follow-up since she completed radiation. Her bleeding subsided with treatment, and she did not have any significant treatment induced toxicities.                             On review of systems, the patient states she is doing well and has enrolled with hospice care. She has noticed some intermittent nausea and poor appetite. Despite this she has been able to maintain her weight and continues to eat regular meals and drinks ensure TID. She denies any vaginal or rectal bleeding, and denies any abdominal pain. She is having regular output from her colostomy. No other complaints are noted.  ALLERGIES:  is allergic to codeine; zofran [ondansetron hcl]; ciprofloxacin; metronidazole; skelaxin [metaxalone]; tetracycline hcl; and avelox [moxifloxacin].  Meds: Current Outpatient Prescriptions  Medication Sig Dispense Refill  . antiseptic oral rinse (BIOTENE) LIQD 15 mLs by Mouth Rinse route 5 (five) times daily. After brushing teeth    . b complex vitamins tablet Take 1 tablet by mouth every other day.    . B Complex-C (SUPER B COMPLEX PO) Take 1 tablet by mouth every other day. Alternating days with B complex    . Biotin 5 MG TABS Take 1 tablet by mouth every morning.     . Cholecalciferol (VITAMIN D) 2000 UNITS tablet Take 2,000 Units by mouth daily with breakfast.    . Dentifrices (SENSODYNE) PSTE Place 1  application onto teeth 5 (five) times daily.    . diphenoxylate-atropine (LOMOTIL) 2.5-0.025 MG tablet take 2 tablets by mouth four times a day 240 tablet 2  . feeding supplement, ENSURE COMPLETE, (ENSURE COMPLETE) LIQD Take 237 mLs by mouth 2 (two) times daily between meals. *Vanilla*    . lidocaine-prilocaine (EMLA) cream Apply 1 application topically as needed. Apply to port site one hour before access 30 g 0  . Multiple Vitamin (MULTIVITAMIN WITH MINERALS) TABS tablet Take 1 tablet by mouth daily with breakfast.     . prochlorperazine (COMPAZINE) 10 MG tablet Take 1 tablet (10 mg total) by mouth every 6 (six) hours as needed for nausea or vomiting. 30 tablet 0  . megestrol (MEGACE) 40 MG tablet Take 1 tablet (40 mg total) by mouth daily. 20 tablet 0   No current facility-administered medications for this encounter.     Physical Findings:  height is 5' 5.5" (1.664 m) and weight is 127 lb 3.2 oz (57.7 kg). Her oral temperature is 98.2 F (36.8 C). Her blood pressure is 147/63 (abnormal) and her pulse is 70. Her respiration is 16 and oxygen saturation is 100%.  In general this is a well appearing caucasian female in no acute distress. She's alert and oriented x4 and appropriate throughout the examination. Cardiopulmonary assessment is negative for acute distress and She exhibits normal effort.   Lab Findings: Lab Results  Component Value  Date   WBC 5.8 10/02/2015   HGB 10.5 (L) 10/02/2015   HCT 30.0 (L) 10/02/2015   MCV 105 (H) 10/02/2015   PLT 137 (L) 10/02/2015     Radiographic Findings: No results found.  Impression/Plan: 1. Metastatic colorectal cancer to uterus. The patient has done well and has not noticed any vaginal or rectal bleeding since her treatment. She has decided that she will proceed with Hospice care, and has been very pleased with their care so far. She will follow up with [place] as needed moving forward. 2. 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 The patient's hgb was last checked in September and had been stable at 10.5. She's no longer bleeding and her counts should continue to improve, but should be rechecked based on her clinical status if she starts having additional bleeding episodes. 3. Nutrition. The patient is drinking ensure TID and I've asked our nutritionist to stop by today with the patient to give her coupons to help with the cost of these supplements.    Carola Rhine, PAC

## 2015-12-15 DIAGNOSIS — I10 Essential (primary) hypertension: Secondary | ICD-10-CM | POA: Diagnosis not present

## 2015-12-15 DIAGNOSIS — J209 Acute bronchitis, unspecified: Secondary | ICD-10-CM | POA: Diagnosis not present

## 2015-12-15 DIAGNOSIS — H00014 Hordeolum externum left upper eyelid: Secondary | ICD-10-CM | POA: Diagnosis not present

## 2015-12-15 DIAGNOSIS — R05 Cough: Secondary | ICD-10-CM | POA: Diagnosis not present

## 2015-12-26 DIAGNOSIS — C787 Secondary malignant neoplasm of liver and intrahepatic bile duct: Secondary | ICD-10-CM | POA: Diagnosis not present

## 2015-12-26 DIAGNOSIS — C189 Malignant neoplasm of colon, unspecified: Secondary | ICD-10-CM | POA: Diagnosis not present

## 2015-12-26 DIAGNOSIS — I10 Essential (primary) hypertension: Secondary | ICD-10-CM | POA: Diagnosis not present

## 2015-12-26 DIAGNOSIS — C7800 Secondary malignant neoplasm of unspecified lung: Secondary | ICD-10-CM | POA: Diagnosis not present

## 2015-12-26 DIAGNOSIS — I2510 Atherosclerotic heart disease of native coronary artery without angina pectoris: Secondary | ICD-10-CM | POA: Diagnosis not present

## 2015-12-26 DIAGNOSIS — C786 Secondary malignant neoplasm of retroperitoneum and peritoneum: Secondary | ICD-10-CM | POA: Diagnosis not present

## 2015-12-26 DIAGNOSIS — R110 Nausea: Secondary | ICD-10-CM | POA: Diagnosis not present

## 2016-01-20 ENCOUNTER — Other Ambulatory Visit: Payer: Self-pay | Admitting: Hematology & Oncology

## 2016-01-26 DIAGNOSIS — I10 Essential (primary) hypertension: Secondary | ICD-10-CM | POA: Diagnosis not present

## 2016-01-26 DIAGNOSIS — C7800 Secondary malignant neoplasm of unspecified lung: Secondary | ICD-10-CM | POA: Diagnosis not present

## 2016-01-26 DIAGNOSIS — C189 Malignant neoplasm of colon, unspecified: Secondary | ICD-10-CM | POA: Diagnosis not present

## 2016-01-26 DIAGNOSIS — C787 Secondary malignant neoplasm of liver and intrahepatic bile duct: Secondary | ICD-10-CM | POA: Diagnosis not present

## 2016-01-26 DIAGNOSIS — I2510 Atherosclerotic heart disease of native coronary artery without angina pectoris: Secondary | ICD-10-CM | POA: Diagnosis not present

## 2016-01-26 DIAGNOSIS — C786 Secondary malignant neoplasm of retroperitoneum and peritoneum: Secondary | ICD-10-CM | POA: Diagnosis not present

## 2016-01-28 DIAGNOSIS — I10 Essential (primary) hypertension: Secondary | ICD-10-CM | POA: Diagnosis not present

## 2016-01-28 DIAGNOSIS — C7800 Secondary malignant neoplasm of unspecified lung: Secondary | ICD-10-CM | POA: Diagnosis not present

## 2016-01-28 DIAGNOSIS — C786 Secondary malignant neoplasm of retroperitoneum and peritoneum: Secondary | ICD-10-CM | POA: Diagnosis not present

## 2016-01-28 DIAGNOSIS — C189 Malignant neoplasm of colon, unspecified: Secondary | ICD-10-CM | POA: Diagnosis not present

## 2016-01-28 DIAGNOSIS — I2510 Atherosclerotic heart disease of native coronary artery without angina pectoris: Secondary | ICD-10-CM | POA: Diagnosis not present

## 2016-01-28 DIAGNOSIS — C787 Secondary malignant neoplasm of liver and intrahepatic bile duct: Secondary | ICD-10-CM | POA: Diagnosis not present

## 2016-01-29 DIAGNOSIS — C7800 Secondary malignant neoplasm of unspecified lung: Secondary | ICD-10-CM | POA: Diagnosis not present

## 2016-01-29 DIAGNOSIS — I2510 Atherosclerotic heart disease of native coronary artery without angina pectoris: Secondary | ICD-10-CM | POA: Diagnosis not present

## 2016-01-29 DIAGNOSIS — I10 Essential (primary) hypertension: Secondary | ICD-10-CM | POA: Diagnosis not present

## 2016-01-29 DIAGNOSIS — C189 Malignant neoplasm of colon, unspecified: Secondary | ICD-10-CM | POA: Diagnosis not present

## 2016-01-29 DIAGNOSIS — C786 Secondary malignant neoplasm of retroperitoneum and peritoneum: Secondary | ICD-10-CM | POA: Diagnosis not present

## 2016-01-29 DIAGNOSIS — C787 Secondary malignant neoplasm of liver and intrahepatic bile duct: Secondary | ICD-10-CM | POA: Diagnosis not present

## 2016-02-03 DIAGNOSIS — I2510 Atherosclerotic heart disease of native coronary artery without angina pectoris: Secondary | ICD-10-CM | POA: Diagnosis not present

## 2016-02-03 DIAGNOSIS — C786 Secondary malignant neoplasm of retroperitoneum and peritoneum: Secondary | ICD-10-CM | POA: Diagnosis not present

## 2016-02-03 DIAGNOSIS — C787 Secondary malignant neoplasm of liver and intrahepatic bile duct: Secondary | ICD-10-CM | POA: Diagnosis not present

## 2016-02-03 DIAGNOSIS — C189 Malignant neoplasm of colon, unspecified: Secondary | ICD-10-CM | POA: Diagnosis not present

## 2016-02-03 DIAGNOSIS — I10 Essential (primary) hypertension: Secondary | ICD-10-CM | POA: Diagnosis not present

## 2016-02-03 DIAGNOSIS — C7800 Secondary malignant neoplasm of unspecified lung: Secondary | ICD-10-CM | POA: Diagnosis not present

## 2016-02-04 DIAGNOSIS — C189 Malignant neoplasm of colon, unspecified: Secondary | ICD-10-CM | POA: Diagnosis not present

## 2016-02-04 DIAGNOSIS — I10 Essential (primary) hypertension: Secondary | ICD-10-CM | POA: Diagnosis not present

## 2016-02-04 DIAGNOSIS — C786 Secondary malignant neoplasm of retroperitoneum and peritoneum: Secondary | ICD-10-CM | POA: Diagnosis not present

## 2016-02-04 DIAGNOSIS — I2510 Atherosclerotic heart disease of native coronary artery without angina pectoris: Secondary | ICD-10-CM | POA: Diagnosis not present

## 2016-02-04 DIAGNOSIS — C787 Secondary malignant neoplasm of liver and intrahepatic bile duct: Secondary | ICD-10-CM | POA: Diagnosis not present

## 2016-02-04 DIAGNOSIS — C7800 Secondary malignant neoplasm of unspecified lung: Secondary | ICD-10-CM | POA: Diagnosis not present

## 2016-02-08 DIAGNOSIS — C189 Malignant neoplasm of colon, unspecified: Secondary | ICD-10-CM | POA: Diagnosis not present

## 2016-02-08 DIAGNOSIS — I2510 Atherosclerotic heart disease of native coronary artery without angina pectoris: Secondary | ICD-10-CM | POA: Diagnosis not present

## 2016-02-08 DIAGNOSIS — I10 Essential (primary) hypertension: Secondary | ICD-10-CM | POA: Diagnosis not present

## 2016-02-08 DIAGNOSIS — C786 Secondary malignant neoplasm of retroperitoneum and peritoneum: Secondary | ICD-10-CM | POA: Diagnosis not present

## 2016-02-08 DIAGNOSIS — C787 Secondary malignant neoplasm of liver and intrahepatic bile duct: Secondary | ICD-10-CM | POA: Diagnosis not present

## 2016-02-08 DIAGNOSIS — C7800 Secondary malignant neoplasm of unspecified lung: Secondary | ICD-10-CM | POA: Diagnosis not present

## 2016-02-09 DIAGNOSIS — C787 Secondary malignant neoplasm of liver and intrahepatic bile duct: Secondary | ICD-10-CM | POA: Diagnosis not present

## 2016-02-09 DIAGNOSIS — C189 Malignant neoplasm of colon, unspecified: Secondary | ICD-10-CM | POA: Diagnosis not present

## 2016-02-09 DIAGNOSIS — C786 Secondary malignant neoplasm of retroperitoneum and peritoneum: Secondary | ICD-10-CM | POA: Diagnosis not present

## 2016-02-09 DIAGNOSIS — C7800 Secondary malignant neoplasm of unspecified lung: Secondary | ICD-10-CM | POA: Diagnosis not present

## 2016-02-09 DIAGNOSIS — I2510 Atherosclerotic heart disease of native coronary artery without angina pectoris: Secondary | ICD-10-CM | POA: Diagnosis not present

## 2016-02-09 DIAGNOSIS — I10 Essential (primary) hypertension: Secondary | ICD-10-CM | POA: Diagnosis not present

## 2016-02-10 DIAGNOSIS — I10 Essential (primary) hypertension: Secondary | ICD-10-CM | POA: Diagnosis not present

## 2016-02-10 DIAGNOSIS — I2510 Atherosclerotic heart disease of native coronary artery without angina pectoris: Secondary | ICD-10-CM | POA: Diagnosis not present

## 2016-02-10 DIAGNOSIS — C786 Secondary malignant neoplasm of retroperitoneum and peritoneum: Secondary | ICD-10-CM | POA: Diagnosis not present

## 2016-02-10 DIAGNOSIS — C7800 Secondary malignant neoplasm of unspecified lung: Secondary | ICD-10-CM | POA: Diagnosis not present

## 2016-02-10 DIAGNOSIS — C189 Malignant neoplasm of colon, unspecified: Secondary | ICD-10-CM | POA: Diagnosis not present

## 2016-02-10 DIAGNOSIS — C787 Secondary malignant neoplasm of liver and intrahepatic bile duct: Secondary | ICD-10-CM | POA: Diagnosis not present

## 2016-02-11 DIAGNOSIS — C189 Malignant neoplasm of colon, unspecified: Secondary | ICD-10-CM | POA: Diagnosis not present

## 2016-02-11 DIAGNOSIS — R197 Diarrhea, unspecified: Secondary | ICD-10-CM | POA: Diagnosis not present

## 2016-02-11 DIAGNOSIS — I2510 Atherosclerotic heart disease of native coronary artery without angina pectoris: Secondary | ICD-10-CM | POA: Diagnosis not present

## 2016-02-11 DIAGNOSIS — I10 Essential (primary) hypertension: Secondary | ICD-10-CM | POA: Diagnosis not present

## 2016-02-11 DIAGNOSIS — R627 Adult failure to thrive: Secondary | ICD-10-CM | POA: Diagnosis not present

## 2016-02-12 DIAGNOSIS — I10 Essential (primary) hypertension: Secondary | ICD-10-CM | POA: Diagnosis not present

## 2016-02-12 DIAGNOSIS — C786 Secondary malignant neoplasm of retroperitoneum and peritoneum: Secondary | ICD-10-CM | POA: Diagnosis not present

## 2016-02-12 DIAGNOSIS — C7800 Secondary malignant neoplasm of unspecified lung: Secondary | ICD-10-CM | POA: Diagnosis not present

## 2016-02-12 DIAGNOSIS — C787 Secondary malignant neoplasm of liver and intrahepatic bile duct: Secondary | ICD-10-CM | POA: Diagnosis not present

## 2016-02-12 DIAGNOSIS — I2510 Atherosclerotic heart disease of native coronary artery without angina pectoris: Secondary | ICD-10-CM | POA: Diagnosis not present

## 2016-02-12 DIAGNOSIS — C189 Malignant neoplasm of colon, unspecified: Secondary | ICD-10-CM | POA: Diagnosis not present

## 2016-02-18 DIAGNOSIS — C189 Malignant neoplasm of colon, unspecified: Secondary | ICD-10-CM | POA: Diagnosis not present

## 2016-02-18 DIAGNOSIS — C786 Secondary malignant neoplasm of retroperitoneum and peritoneum: Secondary | ICD-10-CM | POA: Diagnosis not present

## 2016-02-18 DIAGNOSIS — C7800 Secondary malignant neoplasm of unspecified lung: Secondary | ICD-10-CM | POA: Diagnosis not present

## 2016-02-18 DIAGNOSIS — I2510 Atherosclerotic heart disease of native coronary artery without angina pectoris: Secondary | ICD-10-CM | POA: Diagnosis not present

## 2016-02-18 DIAGNOSIS — C787 Secondary malignant neoplasm of liver and intrahepatic bile duct: Secondary | ICD-10-CM | POA: Diagnosis not present

## 2016-02-18 DIAGNOSIS — I10 Essential (primary) hypertension: Secondary | ICD-10-CM | POA: Diagnosis not present

## 2016-02-19 DIAGNOSIS — I10 Essential (primary) hypertension: Secondary | ICD-10-CM | POA: Diagnosis not present

## 2016-02-19 DIAGNOSIS — I2510 Atherosclerotic heart disease of native coronary artery without angina pectoris: Secondary | ICD-10-CM | POA: Diagnosis not present

## 2016-02-19 DIAGNOSIS — C189 Malignant neoplasm of colon, unspecified: Secondary | ICD-10-CM | POA: Diagnosis not present

## 2016-02-19 DIAGNOSIS — C786 Secondary malignant neoplasm of retroperitoneum and peritoneum: Secondary | ICD-10-CM | POA: Diagnosis not present

## 2016-02-19 DIAGNOSIS — C7800 Secondary malignant neoplasm of unspecified lung: Secondary | ICD-10-CM | POA: Diagnosis not present

## 2016-02-19 DIAGNOSIS — C787 Secondary malignant neoplasm of liver and intrahepatic bile duct: Secondary | ICD-10-CM | POA: Diagnosis not present

## 2016-02-21 DIAGNOSIS — C787 Secondary malignant neoplasm of liver and intrahepatic bile duct: Secondary | ICD-10-CM | POA: Diagnosis not present

## 2016-02-21 DIAGNOSIS — C7800 Secondary malignant neoplasm of unspecified lung: Secondary | ICD-10-CM | POA: Diagnosis not present

## 2016-02-21 DIAGNOSIS — I10 Essential (primary) hypertension: Secondary | ICD-10-CM | POA: Diagnosis not present

## 2016-02-21 DIAGNOSIS — I2510 Atherosclerotic heart disease of native coronary artery without angina pectoris: Secondary | ICD-10-CM | POA: Diagnosis not present

## 2016-02-21 DIAGNOSIS — C786 Secondary malignant neoplasm of retroperitoneum and peritoneum: Secondary | ICD-10-CM | POA: Diagnosis not present

## 2016-02-21 DIAGNOSIS — C189 Malignant neoplasm of colon, unspecified: Secondary | ICD-10-CM | POA: Diagnosis not present

## 2016-02-22 DIAGNOSIS — I10 Essential (primary) hypertension: Secondary | ICD-10-CM | POA: Diagnosis not present

## 2016-02-22 DIAGNOSIS — C7800 Secondary malignant neoplasm of unspecified lung: Secondary | ICD-10-CM | POA: Diagnosis not present

## 2016-02-22 DIAGNOSIS — I2510 Atherosclerotic heart disease of native coronary artery without angina pectoris: Secondary | ICD-10-CM | POA: Diagnosis not present

## 2016-02-22 DIAGNOSIS — C189 Malignant neoplasm of colon, unspecified: Secondary | ICD-10-CM | POA: Diagnosis not present

## 2016-02-22 DIAGNOSIS — C787 Secondary malignant neoplasm of liver and intrahepatic bile duct: Secondary | ICD-10-CM | POA: Diagnosis not present

## 2016-02-22 DIAGNOSIS — C786 Secondary malignant neoplasm of retroperitoneum and peritoneum: Secondary | ICD-10-CM | POA: Diagnosis not present

## 2016-02-23 DIAGNOSIS — C786 Secondary malignant neoplasm of retroperitoneum and peritoneum: Secondary | ICD-10-CM | POA: Diagnosis not present

## 2016-02-23 DIAGNOSIS — I10 Essential (primary) hypertension: Secondary | ICD-10-CM | POA: Diagnosis not present

## 2016-02-23 DIAGNOSIS — C189 Malignant neoplasm of colon, unspecified: Secondary | ICD-10-CM | POA: Diagnosis not present

## 2016-02-23 DIAGNOSIS — C787 Secondary malignant neoplasm of liver and intrahepatic bile duct: Secondary | ICD-10-CM | POA: Diagnosis not present

## 2016-02-23 DIAGNOSIS — C7800 Secondary malignant neoplasm of unspecified lung: Secondary | ICD-10-CM | POA: Diagnosis not present

## 2016-02-23 DIAGNOSIS — I2510 Atherosclerotic heart disease of native coronary artery without angina pectoris: Secondary | ICD-10-CM | POA: Diagnosis not present

## 2016-02-26 DIAGNOSIS — C787 Secondary malignant neoplasm of liver and intrahepatic bile duct: Secondary | ICD-10-CM | POA: Diagnosis not present

## 2016-02-26 DIAGNOSIS — C786 Secondary malignant neoplasm of retroperitoneum and peritoneum: Secondary | ICD-10-CM | POA: Diagnosis not present

## 2016-02-26 DIAGNOSIS — C7800 Secondary malignant neoplasm of unspecified lung: Secondary | ICD-10-CM | POA: Diagnosis not present

## 2016-02-26 DIAGNOSIS — C189 Malignant neoplasm of colon, unspecified: Secondary | ICD-10-CM | POA: Diagnosis not present

## 2016-02-26 DIAGNOSIS — I2510 Atherosclerotic heart disease of native coronary artery without angina pectoris: Secondary | ICD-10-CM | POA: Diagnosis not present

## 2016-02-26 DIAGNOSIS — I10 Essential (primary) hypertension: Secondary | ICD-10-CM | POA: Diagnosis not present

## 2016-02-27 DIAGNOSIS — C786 Secondary malignant neoplasm of retroperitoneum and peritoneum: Secondary | ICD-10-CM | POA: Diagnosis not present

## 2016-02-27 DIAGNOSIS — C787 Secondary malignant neoplasm of liver and intrahepatic bile duct: Secondary | ICD-10-CM | POA: Diagnosis not present

## 2016-02-27 DIAGNOSIS — I10 Essential (primary) hypertension: Secondary | ICD-10-CM | POA: Diagnosis not present

## 2016-02-27 DIAGNOSIS — C7800 Secondary malignant neoplasm of unspecified lung: Secondary | ICD-10-CM | POA: Diagnosis not present

## 2016-02-27 DIAGNOSIS — C189 Malignant neoplasm of colon, unspecified: Secondary | ICD-10-CM | POA: Diagnosis not present

## 2016-02-27 DIAGNOSIS — I2510 Atherosclerotic heart disease of native coronary artery without angina pectoris: Secondary | ICD-10-CM | POA: Diagnosis not present

## 2016-02-28 DIAGNOSIS — C7800 Secondary malignant neoplasm of unspecified lung: Secondary | ICD-10-CM | POA: Diagnosis not present

## 2016-02-28 DIAGNOSIS — I2510 Atherosclerotic heart disease of native coronary artery without angina pectoris: Secondary | ICD-10-CM | POA: Diagnosis not present

## 2016-02-28 DIAGNOSIS — C189 Malignant neoplasm of colon, unspecified: Secondary | ICD-10-CM | POA: Diagnosis not present

## 2016-02-28 DIAGNOSIS — I10 Essential (primary) hypertension: Secondary | ICD-10-CM | POA: Diagnosis not present

## 2016-02-28 DIAGNOSIS — C786 Secondary malignant neoplasm of retroperitoneum and peritoneum: Secondary | ICD-10-CM | POA: Diagnosis not present

## 2016-02-28 DIAGNOSIS — C787 Secondary malignant neoplasm of liver and intrahepatic bile duct: Secondary | ICD-10-CM | POA: Diagnosis not present

## 2016-02-29 DIAGNOSIS — C7800 Secondary malignant neoplasm of unspecified lung: Secondary | ICD-10-CM | POA: Diagnosis not present

## 2016-02-29 DIAGNOSIS — I2510 Atherosclerotic heart disease of native coronary artery without angina pectoris: Secondary | ICD-10-CM | POA: Diagnosis not present

## 2016-02-29 DIAGNOSIS — I10 Essential (primary) hypertension: Secondary | ICD-10-CM | POA: Diagnosis not present

## 2016-02-29 DIAGNOSIS — C189 Malignant neoplasm of colon, unspecified: Secondary | ICD-10-CM | POA: Diagnosis not present

## 2016-02-29 DIAGNOSIS — C786 Secondary malignant neoplasm of retroperitoneum and peritoneum: Secondary | ICD-10-CM | POA: Diagnosis not present

## 2016-02-29 DIAGNOSIS — C787 Secondary malignant neoplasm of liver and intrahepatic bile duct: Secondary | ICD-10-CM | POA: Diagnosis not present

## 2016-03-01 DIAGNOSIS — C189 Malignant neoplasm of colon, unspecified: Secondary | ICD-10-CM | POA: Diagnosis not present

## 2016-03-01 DIAGNOSIS — C787 Secondary malignant neoplasm of liver and intrahepatic bile duct: Secondary | ICD-10-CM | POA: Diagnosis not present

## 2016-03-01 DIAGNOSIS — C7800 Secondary malignant neoplasm of unspecified lung: Secondary | ICD-10-CM | POA: Diagnosis not present

## 2016-03-01 DIAGNOSIS — I2510 Atherosclerotic heart disease of native coronary artery without angina pectoris: Secondary | ICD-10-CM | POA: Diagnosis not present

## 2016-03-01 DIAGNOSIS — I10 Essential (primary) hypertension: Secondary | ICD-10-CM | POA: Diagnosis not present

## 2016-03-01 DIAGNOSIS — C786 Secondary malignant neoplasm of retroperitoneum and peritoneum: Secondary | ICD-10-CM | POA: Diagnosis not present

## 2016-03-25 DEATH — deceased

## 2017-07-16 IMAGING — CT CT CHEST W/ CM
1 of 3 series · 13 of 30 positions shown, 17 images · IV contrast (OMNIPAQUE)
Comparison: Most recent CT scan of the chest, abdomen and pelvis
10/15/2014

CLINICAL DATA: 85-year-old female with a history of colon cancer in
0065 status post surgical resection now with a 1 day history of
rectal bleeding.

EXAM:
CT CHEST, ABDOMEN, AND PELVIS WITH CONTRAST
TECHNIQUE: Multidetector CT imaging of the chest, abdomen and pelvis was
performed following the standard protocol during bolus
administration of intravenous contrast.
CONTRAST:  100mL OMNIPAQUE IOHEXOL 300 MG/ML  SOLN

[Series 2: cap with st · axial · 0.69mm/px · z∈[-536,-31]mm · 13 of 117 slices shown, 17 images]
[im 8/117  mediastinal]
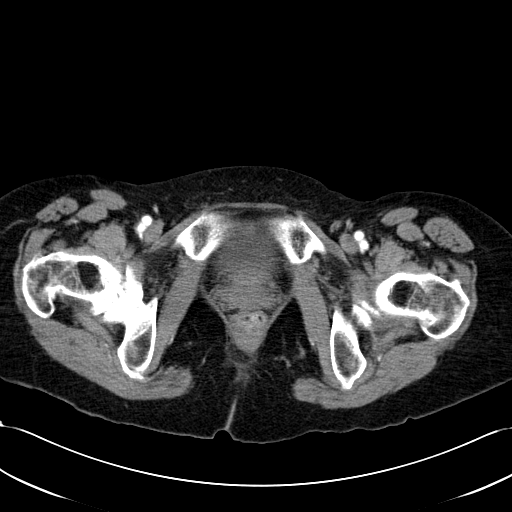
[im 8/117  lung]
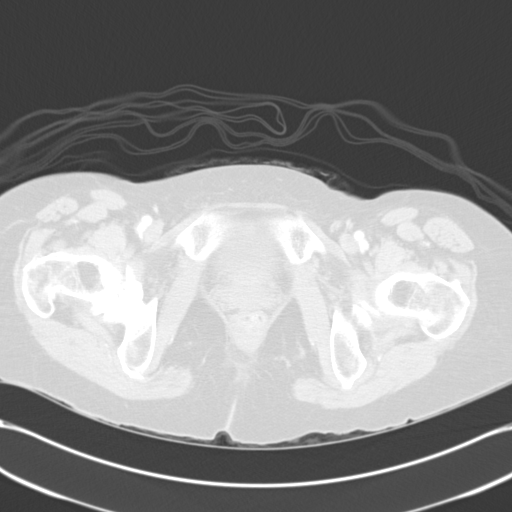
[im 16/117  lung]
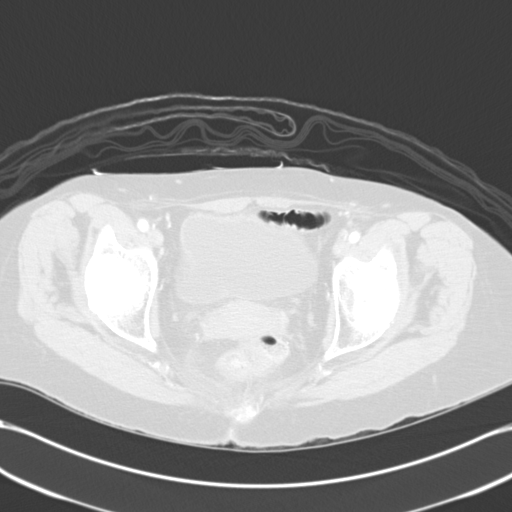
[im 31/117  lung]
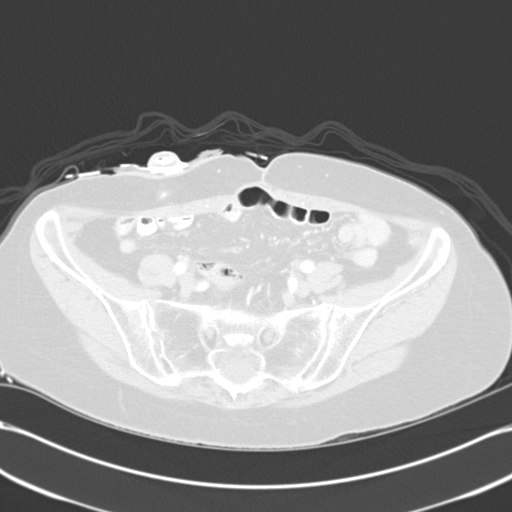
[im 39/117  lung]
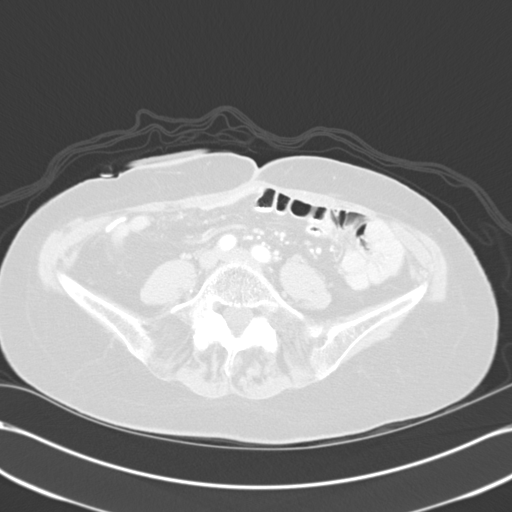
[im 47/117  mediastinal]
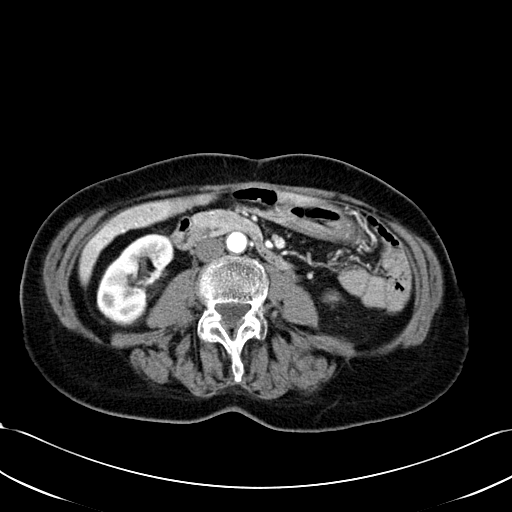
[im 47/117  lung]
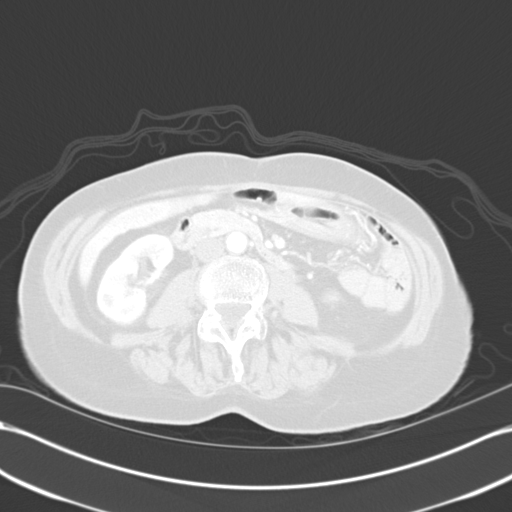
[im 55/117  lung]
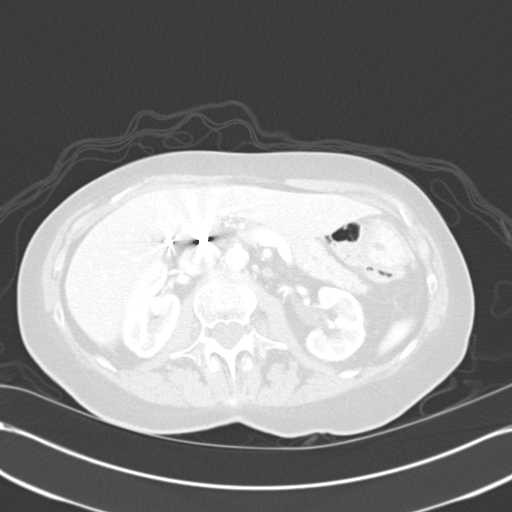
[im 59/117  lung]
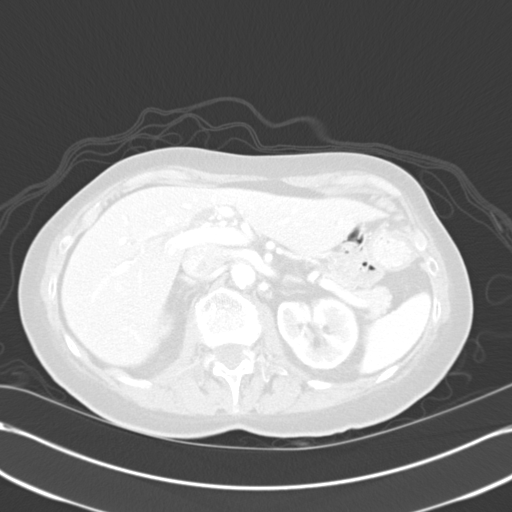
[im 62/117  lung]
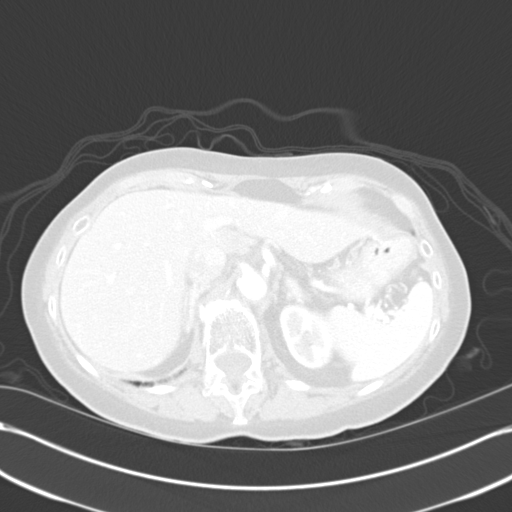
[im 70/117  mediastinal]
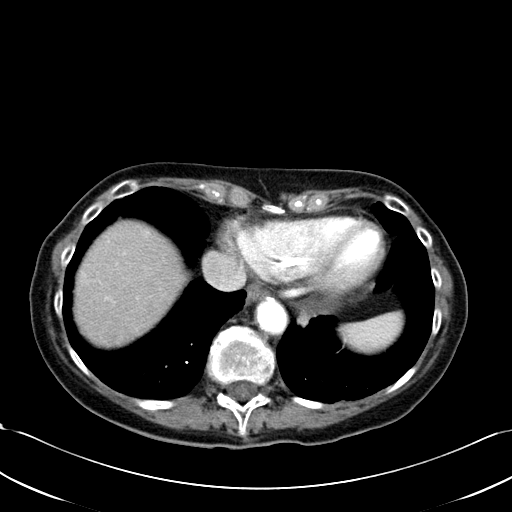
[im 70/117  lung]
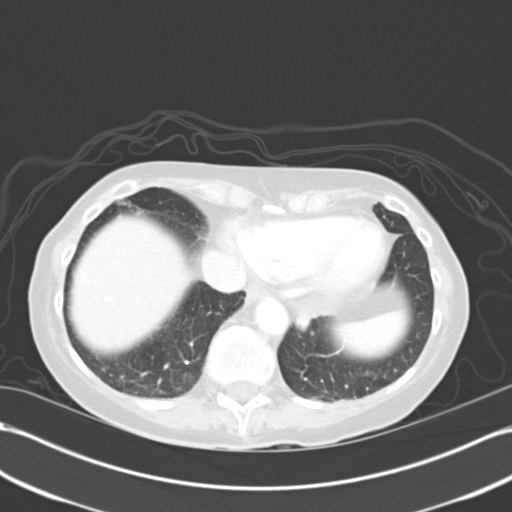
[im 78/117  lung]
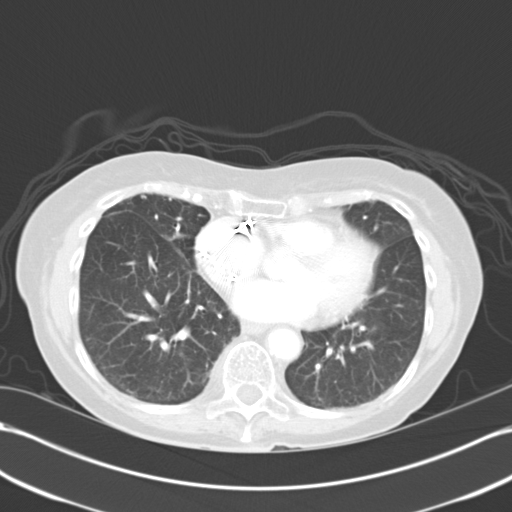
[im 86/117  lung]
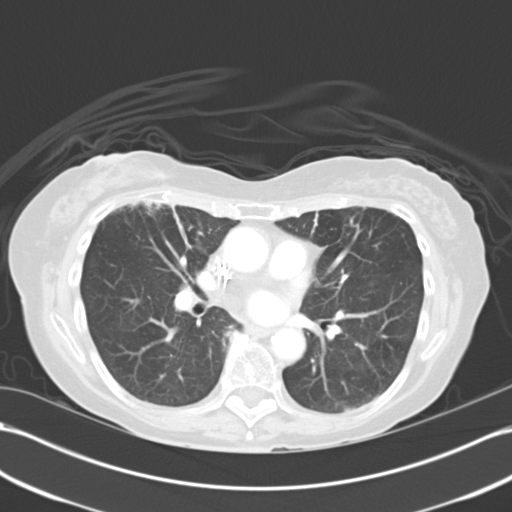
[im 101/117  lung]
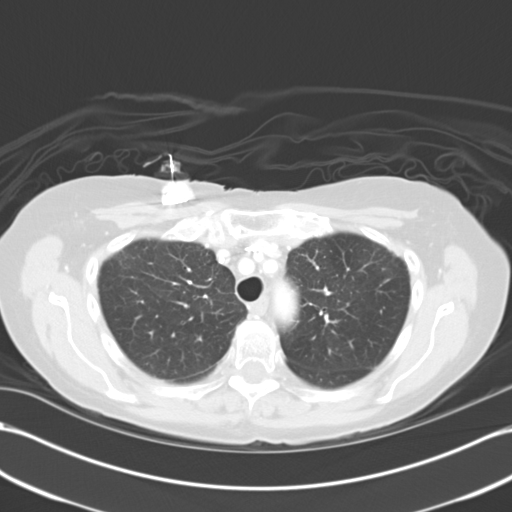
[im 109/117  mediastinal]
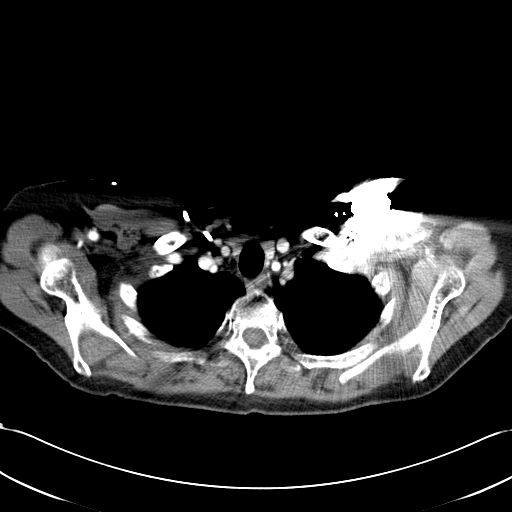
[im 109/117  lung]
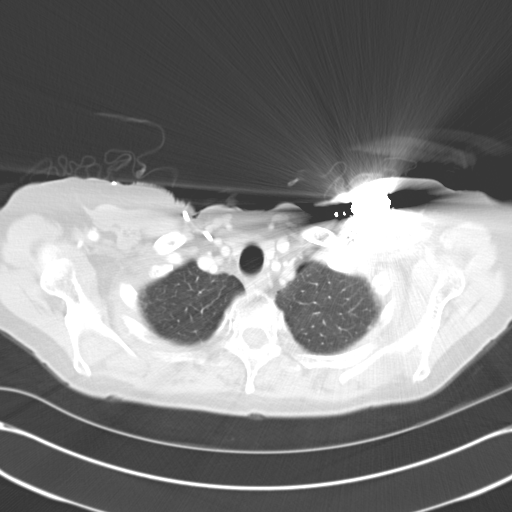

[13 of 30 positions shown; findings below may reference images not displayed]

FINDINGS: CT CHEST FINDINGS

Mediastinum/Nodes: Unremarkable CT appearance of the thyroid gland.
No suspicious mediastinal or hilar adenopathy. No soft tissue
mediastinal mass. The thoracic esophagus is unremarkable. Left
subclavian approach cardiac rhythm maintenance device with leads
terminating in the right atrium and right ventricular apex. Right IJ
approach single-lumen power injectable port catheter. Catheter tip
terminates at the superior cavoatrial junction. The heart is within
normal limits for size. Heart configuration remains similar
secondary to decreased AP diameter of the chest. No pericardial
effusion.

Lungs/Pleura: Interval development of all multiple bilateral
pulmonary nodules ranging in size from 4-5 mm. These findings are
highly concerning for multifocal hematogenous lead distributed
pulmonary metastases. There are at least 7 definitively new lesions.
Numerous additional smaller 2 and 3 mm nodules may have been present
on prior exams and may represent a benign underlying conditions such
as sequelae of old granulomatous disease. No pleural effusion.

Musculoskeletal: No acute fracture or aggressive appearing lytic or
blastic osseous lesion.

CT ABDOMEN PELVIS FINDINGS

Hepatobiliary: Stable 6 mm hypervascular lesion in the anterior
aspect of the hepatic dome compared to prior. This likely represents
a small flash fill hemangioma. Ill-defined low-attenuation lesion
within the caudate lobe of the liver measures approximately 20 x 19
mm (coronal reformatted images) and is concerning for hepatic
metastatic disease. Otherwise, noted definite discrete liver lesions
identified. The gallbladder is surgically absent. No significant
biliary ductal dilatation.

Pancreas: Within normal limits.

Spleen: Within normal limits.

Adrenals/Urinary Tract: Adrenal glands are normal bilaterally. No
adrenal nodules. No evidence of hydronephrosis, nephrolithiasis or
enhancing renal lesion.

Stomach/Bowel: Colonic diverticular disease of the residual sigmoid
colon without CT evidence of active inflammation. History of
subtotal colectomy with right lower quadrant diverting ileostomy. No
focal bowel wall thickening or evidence of obstruction.

Vascular/Lymphatic: No free fluid or definitive adenopathy.

Reproductive: Interval enlargement of a amorphous enhancing soft
tissue adherent to the right anterior aspect of the uterus. This
structure today measures approximately 4.1 x 2.7 cm. Precise
measurement is somewhat difficult secondary to the relatively a
amorphous nature of this lesion. Previously this structure measured
approximately 2.6 x 2.0 cm. Dystrophic calcifications within the
remainder of the uterus likely represent old degenerated fibroids.

Other: No ascites or omental caking.

Musculoskeletal: No acute fracture or aggressive appearing lytic or
blastic osseous lesion.
IMPRESSION: CT CHEST

1. At least 7 new 4-5 mm pulmonary nodules identified scattered
throughout both lungs highly concerning for metastatic disease.
2. No acute cardiopulmonary process.
CT ABDOMEN/PELVIS

1. Enlarging amorphous enhancing soft tissue lesion adherent to the
right anterior aspect of the uterus concerning for serosal
metastasis. The lesion measures approximately 4.1 x 2.7 cm today
compared to 2.6 x 2.0 cm on 10/15/2014.
2. Approximately 2 cm low-attenuation lesion within the caudate lobe
of the liver concerning for solitary hepatic metastasis.
3. No evidence of ascites, peritoneal nodularity or omental caking
to suggest peritoneal carcinomatosis.
4. No definite osseous lesions.
5. No acute abnormality.
